# Patient Record
Sex: Male | Born: 1935 | Race: Black or African American | Hispanic: No | Marital: Married
Health system: Southern US, Community
[De-identification: ages and names within clinical notes are randomized; demographics above are authoritative.]

## PROBLEM LIST (undated history)

## (undated) DIAGNOSIS — N529 Male erectile dysfunction, unspecified: Secondary | ICD-10-CM

## (undated) DIAGNOSIS — Z87891 Personal history of nicotine dependence: Secondary | ICD-10-CM

## (undated) DIAGNOSIS — K219 Gastro-esophageal reflux disease without esophagitis: Secondary | ICD-10-CM

## (undated) DIAGNOSIS — R739 Hyperglycemia, unspecified: Secondary | ICD-10-CM

## (undated) DIAGNOSIS — I708 Atherosclerosis of other arteries: Secondary | ICD-10-CM

## (undated) DIAGNOSIS — I700 Atherosclerosis of aorta: Secondary | ICD-10-CM

## (undated) DIAGNOSIS — K589 Irritable bowel syndrome without diarrhea: Secondary | ICD-10-CM

## (undated) DIAGNOSIS — I10 Essential (primary) hypertension: Secondary | ICD-10-CM

## (undated) DIAGNOSIS — E785 Hyperlipidemia, unspecified: Secondary | ICD-10-CM

## (undated) DIAGNOSIS — K635 Polyp of colon: Secondary | ICD-10-CM

## (undated) DIAGNOSIS — C61 Malignant neoplasm of prostate: Secondary | ICD-10-CM

## (undated) DIAGNOSIS — I6529 Occlusion and stenosis of unspecified carotid artery: Secondary | ICD-10-CM

## (undated) HISTORY — DX: Essential (primary) hypertension: I10

## (undated) HISTORY — DX: Atherosclerosis of other arteries: I70.8

## (undated) HISTORY — DX: Hyperlipidemia, unspecified: E78.5

## (undated) HISTORY — DX: Irritable bowel syndrome, unspecified: K58.9

## (undated) HISTORY — DX: Polyp of colon: K63.5

## (undated) HISTORY — DX: Personal history of nicotine dependence: Z87.891

## (undated) HISTORY — DX: Hyperglycemia, unspecified: R73.9

## (undated) HISTORY — DX: Male erectile dysfunction, unspecified: N52.9

## (undated) HISTORY — PX: OTHER SURGICAL HISTORY: SHX169

## (undated) HISTORY — DX: Occlusion and stenosis of unspecified carotid artery: I65.29

## (undated) HISTORY — DX: Atherosclerosis of aorta: I70.0

## (undated) HISTORY — DX: Gastro-esophageal reflux disease without esophagitis: K21.9

## (undated) HISTORY — PX: TONSILLECTOMY: SUR1361

## (undated) HISTORY — DX: Malignant neoplasm of prostate: C61

---

## 1995-10-25 ENCOUNTER — Encounter: Payer: Self-pay | Admitting: Family Medicine

## 1995-10-25 LAB — CONVERTED CEMR LAB: PSA: 5.3 ng/mL

## 1998-01-15 ENCOUNTER — Encounter: Payer: Self-pay | Admitting: Family Medicine

## 1998-01-28 ENCOUNTER — Ambulatory Visit (HOSPITAL_BASED_OUTPATIENT_CLINIC_OR_DEPARTMENT_OTHER): Admission: RE | Admit: 1998-01-28 | Discharge: 1998-01-28 | Payer: Self-pay | Admitting: Orthopedic Surgery

## 1998-12-23 ENCOUNTER — Encounter: Payer: Self-pay | Admitting: Family Medicine

## 1998-12-23 LAB — CONVERTED CEMR LAB: PSA: 6.5 ng/mL

## 1999-11-02 ENCOUNTER — Encounter: Payer: Self-pay | Admitting: Family Medicine

## 1999-11-02 LAB — CONVERTED CEMR LAB
Blood Glucose, Fasting: 102 mg/dL
PSA: 5.5 ng/mL

## 2000-06-23 HISTORY — PX: OTHER SURGICAL HISTORY: SHX169

## 2000-12-22 ENCOUNTER — Encounter: Payer: Self-pay | Admitting: Family Medicine

## 2000-12-22 LAB — CONVERTED CEMR LAB
PSA: 5.8 ng/mL
TSH: 0.72 microintl units/mL

## 2001-03-01 HISTORY — PX: COLONOSCOPY: SHX174

## 2001-03-01 HISTORY — PX: ESOPHAGOGASTRODUODENOSCOPY: SHX1529

## 2001-03-02 ENCOUNTER — Encounter (INDEPENDENT_AMBULATORY_CARE_PROVIDER_SITE_OTHER): Payer: Self-pay | Admitting: *Deleted

## 2001-03-02 ENCOUNTER — Other Ambulatory Visit: Admission: RE | Admit: 2001-03-02 | Discharge: 2001-03-02 | Payer: Self-pay | Admitting: Gastroenterology

## 2001-04-24 HISTORY — PX: COLONOSCOPY: SHX174

## 2001-10-23 HISTORY — PX: COLONOSCOPY: SHX174

## 2002-03-08 ENCOUNTER — Encounter: Payer: Self-pay | Admitting: Family Medicine

## 2002-03-08 LAB — CONVERTED CEMR LAB: Blood Glucose, Fasting: 114 mg/dL

## 2003-04-11 ENCOUNTER — Encounter: Payer: Self-pay | Admitting: Family Medicine

## 2003-04-23 HISTORY — PX: PROSTATE BIOPSY: SHX241

## 2003-05-24 DIAGNOSIS — C61 Malignant neoplasm of prostate: Secondary | ICD-10-CM

## 2003-05-24 HISTORY — DX: Malignant neoplasm of prostate: C61

## 2003-06-02 ENCOUNTER — Ambulatory Visit: Admission: RE | Admit: 2003-06-02 | Discharge: 2003-08-31 | Payer: Self-pay | Admitting: Radiation Oncology

## 2003-08-11 ENCOUNTER — Encounter: Admission: RE | Admit: 2003-08-11 | Discharge: 2003-08-11 | Payer: Self-pay | Admitting: Radiation Oncology

## 2003-09-02 ENCOUNTER — Ambulatory Visit (HOSPITAL_COMMUNITY): Admission: RE | Admit: 2003-09-02 | Discharge: 2003-09-02 | Payer: Self-pay | Admitting: Urology

## 2003-09-02 ENCOUNTER — Ambulatory Visit (HOSPITAL_BASED_OUTPATIENT_CLINIC_OR_DEPARTMENT_OTHER): Admission: RE | Admit: 2003-09-02 | Discharge: 2003-09-02 | Payer: Self-pay | Admitting: Urology

## 2003-09-23 ENCOUNTER — Ambulatory Visit: Admission: RE | Admit: 2003-09-23 | Discharge: 2003-09-24 | Payer: Self-pay | Admitting: Radiation Oncology

## 2004-03-30 ENCOUNTER — Ambulatory Visit: Admission: RE | Admit: 2004-03-30 | Discharge: 2004-03-30 | Payer: Self-pay | Admitting: Radiation Oncology

## 2004-04-12 ENCOUNTER — Ambulatory Visit: Admission: RE | Admit: 2004-04-12 | Discharge: 2004-04-12 | Payer: Self-pay | Admitting: Radiation Oncology

## 2004-04-14 ENCOUNTER — Ambulatory Visit: Payer: Self-pay | Admitting: Family Medicine

## 2004-04-20 ENCOUNTER — Ambulatory Visit: Payer: Self-pay | Admitting: Family Medicine

## 2004-06-16 ENCOUNTER — Ambulatory Visit: Payer: Self-pay | Admitting: Gastroenterology

## 2004-06-22 ENCOUNTER — Ambulatory Visit: Payer: Self-pay | Admitting: Gastroenterology

## 2004-06-23 HISTORY — PX: COLONOSCOPY: SHX174

## 2005-04-19 ENCOUNTER — Ambulatory Visit: Payer: Self-pay | Admitting: Family Medicine

## 2005-04-19 LAB — CONVERTED CEMR LAB
Blood Glucose, Fasting: 108 mg/dL
Hgb A1c MFr Bld: 5.6 %
PSA: 1.22 ng/mL
WBC, blood: 7.7 10*3/uL

## 2005-04-21 ENCOUNTER — Ambulatory Visit: Payer: Self-pay | Admitting: Family Medicine

## 2006-04-04 ENCOUNTER — Ambulatory Visit: Payer: Self-pay | Admitting: Family Medicine

## 2006-05-11 ENCOUNTER — Ambulatory Visit: Payer: Self-pay | Admitting: Family Medicine

## 2006-05-11 LAB — CONVERTED CEMR LAB
Blood Glucose, Fasting: 107 mg/dL
Microalbumin U total vol: 4.5 mg/L
PSA: 0.6 ng/mL
TSH: 0.67 microintl units/mL
WBC, blood: 5.4 10*3/uL

## 2006-05-26 ENCOUNTER — Ambulatory Visit: Payer: Self-pay | Admitting: Family Medicine

## 2006-08-13 ENCOUNTER — Emergency Department: Payer: Self-pay | Admitting: Emergency Medicine

## 2006-08-18 ENCOUNTER — Ambulatory Visit: Payer: Self-pay | Admitting: Family Medicine

## 2006-08-25 ENCOUNTER — Ambulatory Visit: Payer: Self-pay | Admitting: Family Medicine

## 2007-03-21 ENCOUNTER — Encounter: Payer: Self-pay | Admitting: Family Medicine

## 2007-04-06 ENCOUNTER — Ambulatory Visit: Payer: Self-pay | Admitting: Family Medicine

## 2007-05-22 ENCOUNTER — Ambulatory Visit: Payer: Self-pay | Admitting: Gastroenterology

## 2007-05-30 ENCOUNTER — Ambulatory Visit: Payer: Self-pay | Admitting: Family Medicine

## 2007-05-30 LAB — CONVERTED CEMR LAB
BUN: 10 mg/dL (ref 6–23)
Calcium: 9.3 mg/dL (ref 8.4–10.5)
Chloride: 106 meq/L (ref 96–112)
Cholesterol: 193 mg/dL (ref 0–200)
Eosinophils Relative: 0.4 % (ref 0.0–5.0)
HDL: 28.9 mg/dL — ABNORMAL LOW (ref >39.0)
LDL Cholesterol: 138 mg/dL — ABNORMAL HIGH (ref 0–99)
Monocytes Absolute: 0.3 10*3/uL (ref 0.2–0.7)
Neutrophils Relative %: 83.4 % — ABNORMAL HIGH (ref 43.0–77.0)
Platelets: 266 10*3/uL (ref 150–400)
RBC: 4.86 M/uL (ref 4.22–5.81)
TSH: 0.92 microintl units/mL (ref 0.35–5.50)
Total CHOL/HDL Ratio: 6.7
WBC: 8.6 10*3/uL (ref 4.5–10.5)

## 2007-06-04 ENCOUNTER — Ambulatory Visit: Payer: Self-pay | Admitting: Family Medicine

## 2007-06-04 DIAGNOSIS — E782 Mixed hyperlipidemia: Secondary | ICD-10-CM | POA: Insufficient documentation

## 2007-06-05 ENCOUNTER — Encounter: Payer: Self-pay | Admitting: Family Medicine

## 2007-06-05 DIAGNOSIS — K209 Esophagitis, unspecified without bleeding: Secondary | ICD-10-CM | POA: Insufficient documentation

## 2007-06-05 DIAGNOSIS — R7303 Prediabetes: Secondary | ICD-10-CM

## 2007-06-05 DIAGNOSIS — N529 Male erectile dysfunction, unspecified: Secondary | ICD-10-CM

## 2007-06-08 ENCOUNTER — Encounter: Payer: Self-pay | Admitting: Family Medicine

## 2007-06-08 ENCOUNTER — Ambulatory Visit: Payer: Self-pay | Admitting: Gastroenterology

## 2007-06-08 ENCOUNTER — Encounter: Payer: Self-pay | Admitting: Gastroenterology

## 2007-06-08 DIAGNOSIS — Z860100 Personal history of colon polyps, unspecified: Secondary | ICD-10-CM | POA: Insufficient documentation

## 2007-06-08 HISTORY — PX: COLONOSCOPY: SHX174

## 2007-06-08 LAB — HM COLONOSCOPY

## 2008-02-19 ENCOUNTER — Ambulatory Visit: Payer: Self-pay | Admitting: Family Medicine

## 2008-03-10 ENCOUNTER — Ambulatory Visit: Payer: Self-pay | Admitting: Family Medicine

## 2008-04-08 ENCOUNTER — Ambulatory Visit: Payer: Self-pay | Admitting: Gastroenterology

## 2008-04-08 DIAGNOSIS — K589 Irritable bowel syndrome without diarrhea: Secondary | ICD-10-CM

## 2008-06-18 ENCOUNTER — Ambulatory Visit: Payer: Self-pay | Admitting: Family Medicine

## 2008-06-18 LAB — CONVERTED CEMR LAB
AST: 23 units/L (ref 0–37)
Albumin: 3.8 g/dL (ref 3.5–5.2)
Alkaline Phosphatase: 52 units/L (ref 39–117)
Calcium: 9.2 mg/dL (ref 8.4–10.5)
Chloride: 108 meq/L (ref 96–112)
Cholesterol: 142 mg/dL (ref 0–200)
Creatinine, Ser: 1 mg/dL (ref 0.4–1.5)
Creatinine,U: 91.1 mg/dL
Eosinophils Absolute: 0.1 10*3/uL (ref 0.0–0.7)
GFR calc Af Amer: 94 mL/min
HCT: 41.7 % (ref 39.0–52.0)
Lymphocytes Relative: 19.5 % (ref 12.0–46.0)
MCHC: 33.9 g/dL (ref 30.0–36.0)
Microalb Creat Ratio: 7.7 mg/g (ref 0.0–30.0)
Neutrophils Relative %: 73.9 % (ref 43.0–77.0)
PSA: 0.42 ng/mL (ref 0.10–4.00)
Platelets: 193 10*3/uL (ref 150–400)
Potassium: 3.9 meq/L (ref 3.5–5.1)
RDW: 13.3 % (ref 11.5–14.6)
TSH: 0.79 microintl units/mL (ref 0.35–5.50)
Total Bilirubin: 0.7 mg/dL (ref 0.3–1.2)
Total CHOL/HDL Ratio: 3.9

## 2008-06-24 ENCOUNTER — Ambulatory Visit: Payer: Self-pay | Admitting: Family Medicine

## 2009-03-24 ENCOUNTER — Ambulatory Visit: Payer: Self-pay | Admitting: Family Medicine

## 2009-06-24 ENCOUNTER — Ambulatory Visit: Payer: Self-pay | Admitting: Family Medicine

## 2009-06-24 LAB — CONVERTED CEMR LAB: Microalb, Ur: 1.6 mg/dL (ref 0.0–1.9)

## 2009-06-29 ENCOUNTER — Ambulatory Visit: Payer: Self-pay | Admitting: Family Medicine

## 2009-06-29 DIAGNOSIS — I10 Essential (primary) hypertension: Secondary | ICD-10-CM | POA: Insufficient documentation

## 2009-06-29 HISTORY — DX: Essential (primary) hypertension: I10

## 2009-07-21 ENCOUNTER — Ambulatory Visit: Payer: Self-pay | Admitting: Family Medicine

## 2009-07-21 LAB — CONVERTED CEMR LAB
BUN: 12 mg/dL (ref 6–23)
CO2: 30 meq/L (ref 19–32)
Calcium: 9.1 mg/dL (ref 8.4–10.5)
Sodium: 142 meq/L (ref 135–145)

## 2009-07-29 LAB — CONVERTED CEMR LAB
ALT: 16 units/L (ref 0–53)
Albumin: 3.9 g/dL (ref 3.5–5.2)
Alkaline Phosphatase: 57 units/L (ref 39–117)
BUN: 11 mg/dL (ref 6–23)
Basophils Absolute: 0 10*3/uL (ref 0.0–0.1)
Calcium: 9.1 mg/dL (ref 8.4–10.5)
Chloride: 108 meq/L (ref 96–112)
Eosinophils Absolute: 0 10*3/uL (ref 0.0–0.7)
HCT: 40.3 % (ref 39.0–52.0)
Lymphocytes Relative: 17.2 % (ref 12.0–46.0)
Lymphs Abs: 0.9 10*3/uL (ref 0.7–4.0)
MCHC: 32.8 g/dL (ref 30.0–36.0)
Monocytes Absolute: 0.3 10*3/uL (ref 0.1–1.0)
Neutrophils Relative %: 75 % (ref 43.0–77.0)
Potassium: 4.1 meq/L (ref 3.5–5.1)
RDW: 12.9 % (ref 11.5–14.6)
Sodium: 143 meq/L (ref 135–145)
Total CHOL/HDL Ratio: 4
Total Protein: 7 g/dL (ref 6.0–8.3)
VLDL: 21.4 mg/dL (ref 0.0–40.0)

## 2009-09-16 ENCOUNTER — Ambulatory Visit: Payer: Self-pay | Admitting: Family Medicine

## 2009-09-24 ENCOUNTER — Ambulatory Visit: Payer: Self-pay | Admitting: Family Medicine

## 2009-09-24 LAB — CONVERTED CEMR LAB
BUN: 12 mg/dL (ref 6–23)
CO2: 29 meq/L (ref 19–32)
Potassium: 4.3 meq/L (ref 3.5–5.1)
Sodium: 143 meq/L (ref 135–145)

## 2009-09-28 ENCOUNTER — Ambulatory Visit: Payer: Self-pay | Admitting: Family Medicine

## 2009-12-28 ENCOUNTER — Encounter (INDEPENDENT_AMBULATORY_CARE_PROVIDER_SITE_OTHER): Payer: Self-pay | Admitting: *Deleted

## 2010-02-25 ENCOUNTER — Ambulatory Visit: Payer: Self-pay | Admitting: Family Medicine

## 2010-06-24 NOTE — Assessment & Plan Note (Signed)
Summary: F/U AFTER LABS / LFW   Vital Signs:  Patient profile:   77 year old male Weight:      171.25 pounds Temp:     98.2 degrees F oral Pulse rate:   64 / minute Pulse rhythm:   regular BP sitting:   104 / 68  (left arm) Cuff size:   regular  Vitals Entered By: Sydell Axon LPN (Sep 28, 3084 2:29 PM) CC: 3 Month follow-up after labs   History of Present Illness: Pt here for followup of HTN, put on Lisinopril as he also has hyperglycenia and I have checked a BMet to check ACEI and sugar.  Today he feels well with no acute complaints..  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 One Tab By Mouth At Night. 2)  Prilosec 40 Mg  Cpdr (Omeprazole) .... One Tab By Mouth Once Daily. 3)  Citrucel  Powd (Methylcellulose (Laxative)) .... Take 1 Scoop Dissolved in Water Once Daily 4)  Timoptic 0.25 % Soln (Timolol Maleate) .... Use One Drop in Each Eye Two Times A Day 5)  Lisinopril 10 Mg Tabs (Lisinopril) .... Take One By Mouth Daily  Allergies: No Known Drug Allergies  Physical Exam  General:  Well-developed,well-nourished,in no acute distress; alert,appropriate and cooperative throughout examination, trim. Head:  Normocephalic and atraumatic without  obvious abnormalities. No apparent alopecia or balding. Eyes:  Conjunctiva clear bilaterally.  Ears:  External ear exam shows no significant lesions or deformities.  Otoscopic examination reveals clear canals, tympanic membranes are intact bilaterally without bulging, retraction, inflammation or discharge. Hearing is grossly normal bilaterally. Nose:  External nasal examination shows no deformity or inflammation. Nasal mucosa are pink and moist without lesions or exudates. Mild  clear mucous. Mouth:  Oral mucosa and oropharynx without lesions or exudates.  Teeth in good repair. Neck:  No deformities, masses, or tenderness noted. Chest Wall:  No deformities, masses, tenderness or gynecomastia noted. Lungs:  Normal respiratory effort, chest expands symmetrically. Lungs are clear to auscultation, no crackles or wheezes. Heart:  Normal rate and regular rhythm. S1 and S2 normal without gallop, murmur, click, rub or other extra sounds.   Impression & Recommendations:  Problem # 1:  ESSENTIAL HYPERTENSION (ICD-401.9) Assessment Improved  Finally good nos. Cont curr meds. His updated medication list for this problem includes:    Lisinopril 10 Mg Tabs (Lisinopril) .Marland Kitchen... Take one by mouth daily  BP today: 104/68 Prior BP: 140/80 (09/16/2009) initially 170/88 2/11.  Labs Reviewed: K+: 4.3 (09/24/2009) Creat: : 0.7 (09/24/2009)   Chol: 161 (06/24/2009)   HDL: 39.30 (06/24/2009)   LDL: 100 (06/24/2009)   TG: 107.0 (06/24/2009)  Problem # 2:  UNSPEC DISORDER CARBOHYDRATE TRANSPORT&METAB (ICD-271.9) Assessment: Deteriorated Fasting Glu higher today. Warned him about allowing this to cont to rise to 126 and diabetes.  We discussed avoiding seweets and carbs to avoid DM and keep glu under control.  Complete Medication List: 1)  Crestor 10 Mg Tabs (Rosuvastatin calcium) .... One tab by mouth at night. 2)  Prilosec 40 Mg Cpdr (Omeprazole) .... One tab by mouth once daily. 3)  Citrucel Powd  (Methylcellulose (laxative)) .... Take 1 scoop dissolved in water once daily 4)  Timoptic 0.25 % Soln (Timolol maleate) .... Use one drop in each eye two times a day 5)  Lisinopril 10 Mg Tabs (Lisinopril) .... Take one by mouth daily  Current Allergies (reviewed today): No known allergies

## 2010-06-24 NOTE — Assessment & Plan Note (Signed)
Summary: TICK BITE/NT   Vital Signs:  Patient profile:   77 year old male Weight:      173.25 pounds Temp:     98.3 degrees F oral Pulse rate:   76 / minute Pulse rhythm:   regular BP sitting:   140 / 80  (left arm) Cuff size:   regular  Vitals Entered By: Sydell Axon LPN (September 16, 2009 12:26 PM) CC: Tick bite on right leg, found tick last night   History of Present Illness: Pt here for having an engorged tick on his lower right abd in the inguinal ligament region yesterday for part of the day. He picked it off last night intact. He has no swelling or pain. He has seen no rash. He feels well without fever.  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 One Tab By Mouth At Night. 2)  Prilosec 40 Mg  Cpdr (Omeprazole) .... One Tab By Mouth Once Daily. 3)  Citrucel  Powd (Methylcellulose (Laxative)) .... Take 1 Scoop Dissolved in Water Once Daily 4)  Timoptic 0.25 % Soln (Timolol Maleate) .... Use One Drop in Each Eye Two Times A Day 5)  Lisinopril 10 Mg Tabs (Lisinopril) .... 1/2 Tab By Mouth For Two Weeks and Then Increase To Whole Pill.  Allergies: No Known Drug Allergies  Physical Exam  General:  Well-developed,well-nourished,in no acute distress; alert,appropriate and cooperative  throughout examination, trim. Head:  Normocephalic and atraumatic without obvious abnormalities. No apparent alopecia or balding. Eyes:  Conjunctiva clear bilaterally.  Ears:  External ear exam shows no significant lesions or deformities.  Otoscopic examination reveals clear canals, tympanic membranes are intact bilaterally without bulging, retraction, inflammation or discharge. Hearing is grossly normal bilaterally. Nose:  External nasal examination shows no deformity or inflammation. Nasal mucosa are pink and moist without lesions or exudates. Mild  clear mucous. Mouth:  Oral mucosa and oropharynx without lesions or exudates.  Teeth in good repair. Skin:  small abraded area of the right inguinal ligament area approx 8mm diam w/o irritation, swelling or rash...is minimally erythem.   Impression & Recommendations:  Problem # 1:  TICK BITE (ICD-E906.4) Assessment New Was on less than 24 hrs...risk of dz very low. Discussed rash, fever and joint aches as problems to look for but doubt will see. Call if sxs begin.  Complete Medication List: 1)  Crestor 10 Mg Tabs (Rosuvastatin calcium) .... One tab by mouth at night. 2)  Prilosec 40 Mg Cpdr (Omeprazole) .... One tab by mouth once daily. 3)  Citrucel Powd (Methylcellulose (laxative)) .... Take 1 scoop dissolved in water once daily 4)  Timoptic 0.25 % Soln (Timolol maleate) .... Use one drop in each eye two times a day 5)  Lisinopril 10 Mg Tabs (Lisinopril) .... Take one by mouth daily  Current Allergies (reviewed today): No known allergies

## 2010-06-24 NOTE — Assessment & Plan Note (Signed)
Summary: CPX/DLO   Vital Signs:  Patient profile:   77 year old male Height:      68.5 inches Weight:      171.12 pounds BMI:     25.73 Temp:     97.5 degrees F oral Pulse rate:   60 / minute Pulse rhythm:   regular BP sitting:   170 / 88  (left arm) Cuff size:   regular  Vitals Entered By: Sydell Axon LPN (June 29, 2009 8:21 AM) CC: 30 Minute checkup, colonoscopy 01/09 by Dr. Christella Hartigan   History of Present Illness: Pt here for Comp Exam, doing well with no complaints.  Preventive Screening-Counseling & Management  Alcohol-Tobacco     Alcohol drinks/day: 0     Smoking Status: quit     Year Quit: 1995     Pack years: 40     Passive Smoke Exposure: no  Caffeine-Diet-Exercise     Caffeine use/day: 1     Does Patient Exercise: yes     Type of exercise: bowling 5 days, bikes or walks     Times/week: 5  Problems Prior to Update: 1)  Need Prophylactic Vaccination&inoculation Flu  (ICD-V04.81) 2)  Special Screening Malignant Neoplasm of Prostate  (ICD-V76.44) 3)  Irritable Bowel Syndrome  (ICD-564.1) 4)  Colorectal Cancer, Family Hx  (ICD-V16.0) 5)  Colonic Polyps, Adenomatous, Hx of  (ICD-V12.72) 6)  Erectile Dysfunction, Organic  (ICD-607.84) 7)  Unspecified Glaucoma  (ICD-365.9) 8)  Esophagitis  (ICD-530.10) 9)  Colonic Polyps  (ICD-211.3) 10)  Mixed Hyperlipidemia  (ICD-272.2) 11)  Health Maintenance Exam  (ICD-V70.0) 12)  Malig Neopl of Prostate, 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 One Tab By Mouth At Night. 2)  Xalatan 0.005 % Soln (Latanoprost) .... Apply 1 Drop Into Affected Eye 3)  Prilosec 40 Mg  Cpdr (Omeprazole) .... One Tab By Mouth Once Daily. 4)  Citrucel  Powd (Methylcellulose (Laxative)) .... Take 1 Scoop Dissolved in Water Once Daily  Allergies: No  Known Drug Allergies  Past History:  Past Surgical History: Last updated: 06/24/2008 RIGHT WRIST RELEASE 10/1996  REPEAT 01/1998 NCS/ WRISTS LEFT CARPAL   MIN. / RIGHT IMPROVED:(06/2000) EGD [facility].H. GASTRITIS ESOPH,DUDODENITIS :(03/01/2001) COLONOSCOPYMULTIPLE POLYPS ,DIVERTIC // ADENOMATOUS POLYPS:(03/01/2001) COLONOSCOPY: MULTIPLE POLYPS:(04/24/2001) COLONOSCOPY ; POLYPS BENIGN REPEAT Q 2 YEARS:(10/23/2001) PROSTATE BX POS   RADIOACTIVE SEED IMPLANT (DR SURAL)  04/2003 PROSTATE EXT BEAM RADIATION  (DER GOODCHILD) 1/27-07/23/2003 COLONOSCOPY/ ADENOM/HYPERPLASTIC POLYPS ; EXTERNAL HEMORRHOIDS MULTIPLE (06/23/2004) COLONOSCOPY SINGLE SM POLYP 06/08/2007       REPEAT 5 YRS  Family History: Last updated: 06/05/2007 Father dec 48s unknown Mother dec 85 Colon Ca Hyperchol  Carotid dz no sibs CV: NEGATIVE HBP: NEGATIVE DM: NEGATIVE GOUT/ARTHRITIS: PROSTATE CANCER: +SELF BREAST/OVARIAN/UTERINE CANCER: COLON CANCER: + MOTHER DEPRESSION: NEGATIVE ETOH/DRUG ABUSE: NEGATIVE OTHER: NEGATIVE STROKE  Social History: Last updated: 06/24/2008 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 Eyes:  Denies blurring, discharge, and eye pain; sees neye dr regularly for glaucoma. Pressures stable.. ENT:  Denies decreased hearing, ear discharge, earache, and ringing in ears. CV:  Denies  chest pain or discomfort, fainting, fatigue, palpitations, shortness of breath with exertion, and swelling of feet. Resp:  Denies cough, shortness of breath, and wheezing. GI:  Denies abdominal pain, bloody stools, change in bowel habits, constipation, dark tarry stools, diarrhea, indigestion, loss of  appetite, nausea, vomiting, vomiting blood, and yellowish skin color; IB well controlled with Citrucel.. GU:  Denies dysuria, hematuria, incontinence, nocturia, and urinary frequency. MS:  Denies joint pain, joint redness, low back pain, muscle aches, cramps, and stiffness. Derm:  Denies dryness, itching, and rash. Neuro:  Denies numbness, poor balance, tingling, and tremors.  Physical Exam  General:  Well-developed,well-nourished,in no acute distress; alert,appropriate and cooperative throughout examination, trim. Head:  Normocephalic and atraumatic without obvious abnormalities. No apparent alopecia or balding. Eyes:  Conjunctiva clear bilaterally.  Ears:  External ear exam shows no significant lesions or deformities.  Otoscopic examination reveals clear canals, tympanic membranes are intact bilaterally without bulging, retraction, inflammation or discharge. Hearing is grossly normal bilaterally. Nose:  External nasal examination shows no deformity or inflammation. Nasal mucosa are pink and moist without lesions or exudates. Mild  clear mucous. Mouth:  Oral mucosa and oropharynx without lesions or exudates.  Teeth in good repair. Neck:  No deformities, masses, or tenderness noted. Chest Wall:  No deformities, masses, tenderness or gynecomastia noted. Breasts:  No masses or gynecomastia noted Lungs:  Normal respiratory effort, chest expands symmetrically. Lungs are clear to auscultation, no crackles or wheezes. Heart:  Normal rate and regular rhythm. S1 and S2 normal without gallop, murmur, click, rub or other extra sounds. Abdomen:  Bowel sounds positive,abdomen soft and non-tender without masses, organomegaly or hernias noted. Rectal:  No external abnormalities noted. Normal sphincter tone. No rectal masses or tenderness. G neg. Genitalia:  Testes bilaterally descended without nodularity, tenderness or masses. No scrotal masses or lesions. No penis lesions or urethral discharge. Prostate:   Prostate gland firm and smooth, no enlargement, nodularity, tenderness, mass, asymmetry or induration. 30gms. Msk:  No deformity or scoliosis noted of thoracic or lumbar spine.   Pulses:  R and L carotid,radial,femoral,dorsalis pedis and posterior tibial pulses are full and equal bilaterally Extremities:  No clubbing, cyanosis, edema, or deformity noted with normal full range of motion of all joints.   Neurologic:  No cranial nerve deficits noted. Station and gait are normal. Sensory, motor and coordinative functions appear intact. Skin:  Intact without suspicious lesions or rashes Cervical Nodes:  No lymphadenopathy noted Inguinal Nodes:  No significant adenopathy Psych:  Cognition and judgment appear intact. Alert and cooperative with normal attention span and concentration. No apparent delusions, illusions, hallucinations   Impression & Recommendations:  Problem # 1:  HEALTH MAINTENANCE EXAM (ICD-V70.0) Assessment Comment Only  Problem # 2:  SPECIAL SCREENING MALIGNANT NEOPLASM OF PROSTATE (ICD-V76.44) Assessment: Unchanged Stable. PSA not zero but treated with Seeds and PSA stable, has not elevated.   Problem # 3:  IRRITABLE BOWEL SYNDROME (ICD-564.1) Assessment: Improved Stable and well controlled with Citrucel. Had been taking with ice water and learned to take it with room temp water to dissolve better and has worked well since.  Problem # 4:  COLONIC POLYPS, ADENOMATOUS, HX OF (ICD-V12.72) Assessment: Unchanged Due f/u colonoscopy in 14.  Problem # 5:  UNSPECIFIED GLAUCOMA (ICD-365.9) Assessment: Unchanged Controlled.  Problem # 6:  ESOPHAGITIS (ICD-530.10) Assessment: Unchanged Stable, no complaints. Takes Omeprazole regularly.  Problem # 7:  MIXED HYPERLIPIDEMIA (ICD-272.2) Assessment: Unchanged Well controlled. His updated medication list for this problem includes:  Crestor 10 Mg Tabs (Rosuvastatin calcium) ..... One tab by mouth at night.  Labs Reviewed: SGOT:  21 (06/24/2009)   SGPT: 16 (06/24/2009)   HDL:39.30 (06/24/2009), 36.0 (06/18/2008)  LDL:100 (06/24/2009), 86 (16/02/9603)  Chol:161 (06/24/2009), 142 (06/18/2008)  Trig:107.0 (06/24/2009), 99 (06/18/2008)  Problem # 8:  UNSPEC DISORDER CARBOHYDRATE TRANSPORT&METAB (ICD-271.9) Assessment: Unchanged Stable, encouraged to be careful with sugar intake.  Problem # 9:  ESSENTIAL HYPERTENSION (ICD-401.9) Assessment: New  Start Meds to protect kidney since has glu intol. Recheck. His updated medication list for this problem includes:    Lisinopril 10 Mg Tabs (Lisinopril) .Marland Kitchen... 1/2 tab by mouth for two weeks and then increase to whole pill.  BP today: 170/88 BP by me, same. Prior BP: 120/70 (06/24/2008)  Labs Reviewed: K+: 4.1 (06/24/2009) Creat: : 0.9 (06/24/2009)   Chol: 161 (06/24/2009)   HDL: 39.30 (06/24/2009)   LDL: 100 (06/24/2009)   TG: 107.0 (06/24/2009)  Complete Medication List: 1)  Crestor 10 Mg Tabs (Rosuvastatin calcium) .... One tab by mouth at night. 2)  Prilosec 40 Mg Cpdr (Omeprazole) .... One tab by mouth once daily. 3)  Citrucel Powd (Methylcellulose (laxative)) .... Take 1 scoop dissolved in water once daily 4)  Timoptic 0.25 % Soln (Timolol maleate) .... Use one drop in each eye two times a day 5)  Lisinopril 10 Mg Tabs (Lisinopril) .... 1/2 tab by mouth for two weeks and then increase to whole pill.  Patient Instructions: 1)  RTC 3 mos, Bmet 3 wks and prior 401.9 Prescriptions: PRILOSEC 40 MG  CPDR (OMEPRAZOLE) one tab by mouth once daily.  #90 x 3   Entered and Authorized by:   Shaune Leeks MD   Signed by:   Shaune Leeks MD on 06/29/2009   Method used:   Electronically to        [facility] [phone]* (retail)       [address]       [place], [place]  [address]       Ph: [phone]       Fax: [phone]   RxID:   [id number] CRESTOR 10 MG  TABS (ROSUVASTATIN CALCIUM) one tab by mouth at night.  #30 x 12   Entered and  Authorized by:   Shaune Leeks MD   Signed by:   Shaune Leeks MD on 06/29/2009   Method used:   Electronically to        [facility] [phone]* (retail)       [address]       [place], [place]  [address]       Ph: [phone]       Fax: [phone]   RxID:   [id number] LISINOPRIL 10 MG TABS (LISINOPRIL) 1/2 tab by mouth for two weeks and then increase to whole pill.  #30 x 12   Entered and Authorized by:   Shaune Leeks MD   Signed by:   Shaune Leeks MD on 06/29/2009   Method used:   Electronically to        [facility] [phone]* (retail)       [address]       [place], [place]  [address]       Ph: [phone]  Fax: [phone]   RxID:   [id number]   Current Allergies (reviewed today): No known allergies

## 2010-06-24 NOTE — Assessment & Plan Note (Signed)
Summary: FLU SHOT/CLE  Nurse Visit   Allergies: No Known Drug Allergies  Orders Added: 1)  Flu Vaccine 3yrs + MEDICARE PATIENTS [Q2039] 2)  Administration Flu vaccine - MCR [G0008]  Flu Vaccine Consent Questions     Do you have a history of severe allergic reactions to this vaccine? no    Any prior history of allergic reactions to egg and/or gelatin? no    Do you have a sensitivity to the preservative Thimersol? no    Do you have a past history of Guillan-Barre Syndrome? no    Do you currently have an acute febrile illness? no    Have you ever had a severe reaction to latex? no    Vaccine information given and explained to patient? yes    Are you currently pregnant? no    Lot Number:[id number]   Exp Date:11/20/2010   Site Given  Left Deltoid IMu  

## 2010-06-24 NOTE — Letter (Signed)
Summary: Nadara Eaton letter  [facility]  [address] [place], [place] [address]   Phone: [phone]  Fax: [phone]       12/28/2009 MRN: [id number]  [facility] [address] [place], [place]  [address]  Dear Mr. BELTRAN,  [facility], and [facility] announce the retirement of Arta Silence, M.D., from full-time practice at the [facility] office effective November 19, 2009 and his plans of returning part-time.  It is important to Dr. Hetty Ely and to our practice that you understand that [facility] has seven physicians in our office for your health care needs.  We will continue to offer the same exceptional care that you have today.    Dr. Hetty Ely has spoken to many of you about his plans for retirement and returning part-time in the fall.   We will continue to work with you through the transition to schedule appointments for you in the office and meet the high standards that [facility] is committed to.   Again, it is with great pleasure that we share the news that Dr. Hetty Ely will return to [facility] in October of 2011 with a reduced schedule.    If you have any questions, or would like to request an appointment with one of our physicians, please call us at [phone] and press the option for Scheduling an appointment.  We take pleasure in providing you with excellent patient care and look forward to seeing you at your next office visit.  Our [facility] Physicians are:  Tillman Abide, M.D. Laurita Quint, M.D. Roxy Manns, M.D. Kerby Nora, M.D. Hannah Beat, M.D. Ruthe Mannan, M.D. We proudly welcomed Raechel Ache, M.D. and Eustaquio Boyden, M.D. to the practice in July/August 2011.  Sincerely,  [facility]

## 2010-06-25 ENCOUNTER — Encounter (INDEPENDENT_AMBULATORY_CARE_PROVIDER_SITE_OTHER): Payer: Self-pay | Admitting: *Deleted

## 2010-06-25 ENCOUNTER — Ambulatory Visit: Admit: 2010-06-25 | Payer: Self-pay | Admitting: Family Medicine

## 2010-06-25 ENCOUNTER — Other Ambulatory Visit (INDEPENDENT_AMBULATORY_CARE_PROVIDER_SITE_OTHER): Payer: MEDICARE

## 2010-06-25 ENCOUNTER — Other Ambulatory Visit: Payer: Self-pay | Admitting: Family Medicine

## 2010-06-25 DIAGNOSIS — E782 Mixed hyperlipidemia: Secondary | ICD-10-CM

## 2010-06-25 DIAGNOSIS — E749 Disorder of carbohydrate metabolism, unspecified: Secondary | ICD-10-CM

## 2010-06-25 DIAGNOSIS — Z86010 Personal history of colonic polyps: Secondary | ICD-10-CM

## 2010-06-25 DIAGNOSIS — I10 Essential (primary) hypertension: Secondary | ICD-10-CM

## 2010-06-25 DIAGNOSIS — Z125 Encounter for screening for malignant neoplasm of prostate: Secondary | ICD-10-CM

## 2010-06-25 LAB — BASIC METABOLIC PANEL
Calcium: 9 mg/dL (ref 8.4–10.5)
Creatinine, Ser: 0.8 mg/dL (ref 0.4–1.5)
GFR: 120.99 mL/min (ref >60.00)
Glucose, Bld: 101 mg/dL — ABNORMAL HIGH (ref 70–99)
Sodium: 141 mEq/L (ref 135–145)

## 2010-06-25 LAB — CBC WITH DIFFERENTIAL/PLATELET
Basophils Absolute: 0 10*3/uL (ref 0.0–0.1)
Basophils Relative: 0.4 % (ref 0.0–3.0)
HCT: 42.4 % (ref 39.0–52.0)
Hemoglobin: 14.1 g/dL (ref 13.0–17.0)
MCV: 87 fl (ref 78.0–100.0)
Monocytes Relative: 6.2 % (ref 3.0–12.0)
WBC: 6.4 10*3/uL (ref 4.5–10.5)

## 2010-06-25 LAB — TSH: TSH: 1.22 u[IU]/mL (ref 0.35–5.50)

## 2010-06-25 LAB — MICROALBUMIN / CREATININE URINE RATIO: Microalb Creat Ratio: 3.3 mg/g (ref 0.0–30.0)

## 2010-06-25 LAB — LIPID PANEL
LDL Cholesterol: 91 mg/dL (ref 0–99)
Total CHOL/HDL Ratio: 4

## 2010-06-25 LAB — HEPATIC FUNCTION PANEL
ALT: 15 U/L (ref 0–53)
AST: 17 U/L (ref 0–37)
Bilirubin, Direct: 0.1 mg/dL (ref 0.0–0.3)

## 2010-06-25 LAB — PSA: PSA: 0.77 ng/mL (ref 0.10–4.00)

## 2010-06-30 ENCOUNTER — Encounter: Payer: Self-pay | Admitting: Family Medicine

## 2010-06-30 ENCOUNTER — Encounter (INDEPENDENT_AMBULATORY_CARE_PROVIDER_SITE_OTHER): Payer: MEDICARE | Admitting: Family Medicine

## 2010-06-30 DIAGNOSIS — Z0000 Encounter for general adult medical examination without abnormal findings: Secondary | ICD-10-CM

## 2010-07-07 ENCOUNTER — Encounter: Payer: Self-pay | Admitting: Family Medicine

## 2010-07-08 NOTE — Assessment & Plan Note (Signed)
Summary: CPX/ CLE   Vital Signs:  Patient profile:   77 year old male Weight:      168.25 pounds BMI:     25.30 Temp:     97.2 degrees F oral Pulse rate:   64 / minute Pulse rhythm:   regular BP sitting:   124 / 80  (left arm) Cuff size:   regular  Vitals Entered By: Sydell Axon LPN (June 30, 2010 8:08 AM) CC: 30 minute checkup, had a colonoscopy 1/09   History of Present Illness: Pt here for Comp Exam, feeling well without complaint. HE feels well.  He is tolerating his medications well without problem. He has been diagnosed with glaucoma. He is now on drops. He sees someone at [facility].   Preventive Screening-Counseling & Management  Alcohol-Tobacco     Alcohol drinks/day: 0     Smoking Status: quit     Year Quit: 1995     Pack years: 40     Passive Smoke Exposure: no  Caffeine-Diet-Exercise     Caffeine use/day: 1     Does Patient Exercise: yes     Type of exercise: bowling 5 days, bikes or walks     Times/week: 5  Problems Prior to Update: 1)  Essential Hypertension  (ICD-401.9) 2)  Need Prophylactic Vaccination&inoculation Flu  (ICD-V04.81) 3)  Special Screening Malignant Neoplasm of Prostate  (ICD-V76.44) 4)  Irritable Bowel Syndrome  (ICD-564.1) 5)  Colorectal Cancer, Family Hx  (ICD-V16.0) 6)  Colonic Polyps, Adenomatous, Hx of  (ICD-V12.72) 7)  Erectile Dysfunction, Organic  (ICD-607.84) 8)  Unspecified Glaucoma  (ICD-365.9) 9)  Esophagitis  (ICD-530.10) 10)  Mixed Hyperlipidemia  (ICD-272.2) 11)  Health Maintenance Exam  (ICD-V70.0) 12)  Malig Neopl of Prostate, 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 One Tab By Mouth At Night. 2)  Prilosec 40 Mg  Cpdr (Omeprazole) .... One Tab By Mouth Once Daily. 3)  Citrucel  Powd (Methylcellulose (Laxative)) .... Take 1 Scoop Dissolved in Water Once Daily 4)  Timoptic  0.25 % Soln (Timolol Maleate) .... Use One Drop in Each Eye Two Times A Day 5)  Lisinopril 10 Mg Tabs (Lisinopril) .... Take One By Mouth Daily  Current Medications (verified): 1)  Crestor 10 Mg  Tabs (Rosuvastatin Calcium) .... One Tab By Mouth At Night. 2)  Prilosec 40 Mg  Cpdr (Omeprazole) .... One Tab By Mouth Once Daily. 3)  Citrucel  Powd (Methylcellulose (Laxative)) .... Take 1 Scoop Dissolved in Water Once Daily 4)  Timoptic 0.25 % Soln (Timolol Maleate) .... Use One Drop in Each Eye Two Times A Day 5)  Lisinopril 10 Mg Tabs (Lisinopril) .... Take One By Mouth Daily  Allergies: No Known Drug Allergies  Past History:  Past Surgical History: Last updated: 06/24/2008 RIGHT WRIST RELEASE 10/1996  REPEAT 01/1998 NCS/ WRISTS LEFT CARPAL   MIN. / RIGHT IMPROVED:(06/2000) EGD H.H. GASTRITIS ESOPH,DUDODENITIS :(03/01/2001) COLONOSCOPYMULTIPLE POLYPS ,DIVERTIC // ADENOMATOUS POLYPS:(03/01/2001) COLONOSCOPY: MULTIPLE POLYPS:(04/24/2001) COLONOSCOPY ; POLYPS BENIGN REPEAT Q 2 YEARS:(10/23/2001) PROSTATE BX POS   RADIOACTIVE SEED IMPLANT (DR SURAL)  04/2003 PROSTATE EXT BEAM RADIATION  (DER GOODCHILD) 1/27-07/23/2003 COLONOSCOPY/ ADENOM/HYPERPLASTIC POLYPS ; EXTERNAL HEMORRHOIDS MULTIPLE (06/23/2004) COLONOSCOPY SINGLE SM POLYP 06/08/2007       REPEAT 5 YRS  Family History: Last updated: 06/05/2007 Father dec 54s unknown Mother dec 85 Colon Ca Hyperchol  Carotid dz no sibs  CV: NEGATIVE HBP: NEGATIVE DM: NEGATIVE GOUT/ARTHRITIS: PROSTATE CANCER: +SELF BREAST/OVARIAN/UTERINE CANCER: COLON CANCER: + MOTHER DEPRESSION: NEGATIVE ETOH/DRUG ABUSE: NEGATIVE OTHER: NEGATIVE STROKE  Social History: Last updated: 06/24/2008 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 Eyes:  Denies blurring, discharge, and eye pain; has glaucoma. ENT:  Denies decreased hearing, ear discharge, earache, and ringing in ears. CV:  Denies chest pain or discomfort, fainting, fatigue, palpitations, shortness of breath with exertion, swelling of feet, and swelling of hands. Resp:  Denies cough, shortness of breath, and wheezing. GI:  Denies abdominal pain, bloody stools, change in bowel habits, constipation, dark tarry stools, diarrhea, indigestion, loss of appetite, nausea, vomiting, vomiting blood, and yellowish skin color. GU:  Denies discharge, dysuria, nocturia, and urinary frequency. MS:  Denies joint pain, muscle aches, cramps, and stiffness. Derm:  Denies dryness, itching, and rash. Neuro:  Denies numbness, poor balance, tingling, and tremors.  Physical Exam  General:  Well-developed,well-nourished,in no acute distress; alert,appropriate and cooperative throughout examination, trim. Head:  Normocephalic and atraumatic without obvious abnormalities. No apparent alopecia or balding. Eyes:  Conjunctiva clear bilaterally.  Ears:  External ear exam shows no significant lesions or deformities.  Otoscopic examination reveals clear canals, tympanic membranes are intact bilaterally without bulging, retraction, inflammation or discharge. Hearing is grossly normal bilaterally. Nose:  External nasal examination shows no deformity or inflammation. Nasal mucosa are pink and moist without lesions or exudates. Mild  clear mucous. Mouth:  Oral mucosa and oropharynx without lesions or exudates.  Teeth in good repair. Neck:  No deformities, masses, or tenderness noted. Chest Wall:  No deformities, masses, tenderness or gynecomastia noted. Breasts:  No masses or gynecomastia noted Lungs:  Normal respiratory effort, chest expands symmetrically. Lungs are clear to auscultation, no  crackles or wheezes. Heart:  Normal rate and regular rhythm. S1 and S2 normal without gallop, murmur, click, rub or other extra sounds. Abdomen:  Bowel sounds positive,abdomen soft and non-tender without masses, organomegaly or hernias noted. Rectal:  No external abnormalities noted. Normal sphincter tone. No rectal masses or tenderness. G neg. Genitalia:  Testes bilaterally descended without nodularity, tenderness or masses. No scrotal masses or lesions. No penis lesions or urethral discharge. Prostate:  Prostate gland firm and smooth, no enlargement, nodularity, tenderness, mass, asymmetry or induration. 30gms. Msk:  No deformity or scoliosis noted of thoracic or lumbar spine.   Pulses:  R and L carotid,radial,femoral,dorsalis pedis and posterior tibial pulses are full and equal bilaterally Extremities:  No clubbing, cyanosis, edema, or deformity noted with normal full range of motion of all joints.   Neurologic:  No cranial nerve deficits noted. Station and gait are normal. Sensory, motor and coordinative functions appear intact. Skin:  Intact without suspicious lesions or rashes, sparse benign moles of the trunk. Cervical Nodes:  No lymphadenopathy noted Inguinal Nodes:  No significant adenopathy Psych:  Cognition and judgment appear intact. Alert and cooperative with normal attention span and concentration. No apparent delusions, illusions, hallucinations   Impression & Recommendations:  Problem # 1:  HEALTH MAINTENANCE EXAM (ICD-V70.0) Assessment Comment Only  Reviewed preventive care protocols, scheduled due services, and updated immunizations. Given script for Zostavax.  Problem # 2:  SPECIAL SCREENING MALIGNANT NEOPLASM OF PROSTATE (ICD-V76.44) Assessment: Unchanged Stable PSA  and exam.  Problem # 3:  IRRITABLE BOWEL SYNDROME (ICD-564.1) Assessment: Unchanged Stable and welll regulated.  Problem # 4:  MIXED HYPERLIPIDEMIA (ICD-272.2) Assessment: Unchanged Good nos on  Crestor. Script sent. His updated medication list for this problem includes:    Crestor 10 Mg Tabs (Rosuvastatin calcium) ..... One tab by mouth at night.  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 Again discussed diet control. HE is very careful..  Complete Medication List: 1)  Crestor 10 Mg Tabs (Rosuvastatin calcium) .... One tab by mouth at night. 2)  Prilosec 40 Mg Cpdr (Omeprazole) .... One tab by mouth once daily. 3)  Citrucel Powd (Methylcellulose (laxative)) .... Take 1 scoop dissolved in water once daily 4)  Timoptic 0.25 % Soln (Timolol maleate) .... Use one drop in each eye two times a day 5)  Lisinopril 10 Mg Tabs (Lisinopril) .... Take one by mouth daily 6)  Zostavax 02725 Unt/0.62ml Solr (Zoster vaccine live) .... Administer  Patient Instructions: 1)  RTC one year, sooner as needed. Prescriptions: LISINOPRIL 10 MG TABS (LISINOPRIL) Take one by mouth daily  #90 x 3   Entered and Authorized by:   Shaune Leeks MD   Signed by:   Shaune Leeks MD on 06/30/2010   Method used:   Electronically to        [facility]. [phone]* (retail)       [address]       [place], [place]  [address]       Ph: [phone]       Fax: [phone]   RxID:   [id number] PRILOSEC 40 MG  CPDR (OMEPRAZOLE) one tab by mouth once daily.  #90 x 3   Entered and Authorized by:   Shaune Leeks MD   Signed by:   Shaune Leeks MD on 06/30/2010   Method used:   Electronically to        [facility]. [phone]* (retail)       [address]       [place], [place]  [address]       Ph: [phone]       Fax: [phone]   RxID:    [id number] CRESTOR 10 MG  TABS (ROSUVASTATIN CALCIUM) one tab by mouth at night.  #30 x 12   Entered and Authorized by:   Shaune Leeks MD   Signed by:   Shaune Leeks MD on 06/30/2010   Method used:   Electronically to        [facility]. [phone]* (retail)       [address]       [place], [place]  [address]       Ph: [phone]       Fax: [phone]   RxID:   [id number] ZOSTAVAX 19400 UNT/0.65ML SOLR (ZOSTER VACCINE LIVE) administer  #1 x 0   Entered and Authorized by:   Shaune Leeks MD   Signed by:   Shaune Leeks MD on 06/30/2010   Method used:   Print then Give to Patient  RxID:   [id number]    Orders Added: 1)  Est. Patient 65& > [13086]    Current Allergies (reviewed today): No known allergies

## 2010-07-14 NOTE — Miscellaneous (Signed)
Summary: Zostavax   Clinical Lists Changes  Observations: Added new observation of ZOSTAVAX: Zostavax (07/01/2010 15:15)      Other Immunization History:    Zostavax # 1:  Zostavax (07/01/2010) Received form from [facility], [place]

## 2010-10-08 NOTE — Op Note (Signed)
NAME:  CASTLE, LAMONS                          ACCOUNT NO.:  [id number]   MEDICAL RECORD NO.:  [id number]                   PATIENT TYPE:  AMB   LOCATION:  [facility]                                 FACILITY:  [facility]   PHYSICIAN:  Claudette Laws, M.D.               DATE OF BIRTH:  1932-10-08   DATE OF PROCEDURE:  09/02/2003  DATE OF DISCHARGE:                                 OPERATIVE REPORT   PREOPERATIVE DIAGNOSIS:  Carcinoma of the prostate gland.   POSTOPERATIVE DIAGNOSIS:  Carcinoma of the prostate gland.   OPERATION:  1. Transperineal radioactive seed implantation, I-125.  2. Cystoscopy.   SURGEON:  Claudette Laws, M.D.   PROCEDURE:  The patient was prepped and draped in the dorsal lithotomy  position under general anesthesia.  A Foley catheter was placed.  The rectal  tube was placed, and then Dr. Kathrin Greathouse placed the transrectal probe,  and imaging studies were performed, conforming to the pre-implant volume  study.  We then performed transperineal implantation using a total of 24  needles and 107 seeds.  Anchors were placed at  C 2.5 and E 2.5.  The  procedure went well.  At the conclusion, the Foley catheter was removed,  rigid cystoscopy confirmed a normal anterior urethra, a somewhat hemorrhagic  prostate.  The bladder itself looked normal.  No tumors, calculi, or seeds  were normal.  Normal ureteral orifices.  I replaced the Foley with an 44  French 10 cc catheter and hooked it to a drainage bag.  The patient was then  taken back to the recovery room in satisfactory condition.                                               Claudette Laws, M.D.    RFS/MEDQ  D:  09/02/2003  T:  09/02/2003  Job:  [id number]

## 2010-11-09 ENCOUNTER — Ambulatory Visit (INDEPENDENT_AMBULATORY_CARE_PROVIDER_SITE_OTHER): Payer: Medicare Other | Admitting: Family Medicine

## 2010-11-09 ENCOUNTER — Encounter: Payer: Self-pay | Admitting: Family Medicine

## 2010-11-09 VITALS — BP 122/78 | HR 68 | Temp 98.2°F | Ht 70.0 in | Wt 164.1 lb

## 2010-11-09 DIAGNOSIS — J069 Acute upper respiratory infection, unspecified: Secondary | ICD-10-CM

## 2010-11-09 MED ORDER — DOXYCYCLINE HYCLATE 100 MG PO CAPS: 100.0000 mg | ORAL_CAPSULE | Freq: Two times a day (BID) | ORAL | Status: AC

## 2010-11-09 NOTE — Assessment & Plan Note (Signed)
Sounds like viral URTI. Treat supportively as per instructions. Given recent tick exposure and chills, provided with doxy script.  Fill if any worsening or change.  However may hold on abx given improving on own.

## 2010-11-09 NOTE — Patient Instructions (Signed)
Sounds like viral upper respiratory infection. Treat supportively with plenty of rest and fluids. Continue cough medicine as up to now. Given recent tick exposure and chills, provided with antibiotic to fill if any return of fever or chills or any abdominal pain, headache, muscle or joint aches. Call us if not improving as expected.

## 2010-11-09 NOTE — Progress Notes (Signed)
  Subjective:    Patient ID: Thomas Lopez, male    DOB: [birth year], 77 y.o.   MRN: [id number]  HPI CC: cold sxs?  "terribly sick Saturday night" happened after going out to dinner on Saturday for father's day.  Started having chills, coughing, sneezing.  Sunday had diarrhea, coughing and sneezing continued.  Still sounding hoarse.  Diarrhea now better.  Subjective chills.  Has tried cough syrup which helped some.  Cough slowly improving.  Mildly productive.  No abd pain, n/v, ST, myalgia, arthralgia.  Denies rash, neck stiffness, HA.  Recent tick exposure, got bitten several times 3 wks ago.  No sick contacts or smokers at home.  No h/o asthma/COPD.  Review of Systems Per HPI    Objective:   Physical Exam  Nursing note and vitals reviewed. Constitutional: He appears well-developed and well-nourished. No distress.       Somewhat hoarse  HENT:  Head: Normocephalic and atraumatic.  Right Ear: Tympanic membrane, external ear and ear canal normal.  Left Ear: Tympanic membrane, external ear and ear canal normal.  Nose: Nose normal. No mucosal edema or rhinorrhea. Right sinus exhibits no maxillary sinus tenderness and no frontal sinus tenderness. Left sinus exhibits no maxillary sinus tenderness and no frontal sinus tenderness.  Mouth/Throat: Oropharynx is clear and moist. No oropharyngeal exudate.  Eyes: Conjunctivae and EOM are normal. Pupils are equal, round, and reactive to light. No scleral icterus.  Neck: Normal range of motion. Neck supple. No thyromegaly present.  Cardiovascular: Normal rate, regular rhythm, normal heart sounds and intact distal pulses.   No murmur heard. Pulmonary/Chest: Effort normal and breath sounds normal. No respiratory distress. He has no wheezes. He has no rales.       Mild crackles bibasilarly  Abdominal: Soft. Bowel sounds are normal. He exhibits no distension. There is no hepatosplenomegaly. There is no tenderness. There is no rebound and no guarding.    Lymphadenopathy:    He has no cervical adenopathy.  Skin: Skin is warm and dry. No rash noted.          Assessment & Plan:

## 2011-03-03 ENCOUNTER — Ambulatory Visit (INDEPENDENT_AMBULATORY_CARE_PROVIDER_SITE_OTHER): Payer: Medicare Other

## 2011-03-03 DIAGNOSIS — Z23 Encounter for immunization: Secondary | ICD-10-CM

## 2011-05-01 DIAGNOSIS — H2510 Age-related nuclear cataract, unspecified eye: Secondary | ICD-10-CM | POA: Insufficient documentation

## 2011-06-30 ENCOUNTER — Other Ambulatory Visit: Payer: Self-pay | Admitting: Family Medicine

## 2011-06-30 DIAGNOSIS — E782 Mixed hyperlipidemia: Secondary | ICD-10-CM

## 2011-06-30 DIAGNOSIS — Z125 Encounter for screening for malignant neoplasm of prostate: Secondary | ICD-10-CM

## 2011-06-30 DIAGNOSIS — I10 Essential (primary) hypertension: Secondary | ICD-10-CM

## 2011-07-08 ENCOUNTER — Other Ambulatory Visit (INDEPENDENT_AMBULATORY_CARE_PROVIDER_SITE_OTHER): Payer: Medicare Other

## 2011-07-08 DIAGNOSIS — Z125 Encounter for screening for malignant neoplasm of prostate: Secondary | ICD-10-CM

## 2011-07-08 DIAGNOSIS — I10 Essential (primary) hypertension: Secondary | ICD-10-CM

## 2011-07-08 DIAGNOSIS — E782 Mixed hyperlipidemia: Secondary | ICD-10-CM

## 2011-07-08 LAB — BASIC METABOLIC PANEL
CO2: 28 mEq/L (ref 19–32)
Calcium: 9.3 mg/dL (ref 8.4–10.5)
GFR: 117.27 mL/min (ref >60.00)
Glucose, Bld: 103 mg/dL — ABNORMAL HIGH (ref 70–99)

## 2011-07-08 LAB — PSA: PSA: 1.13 ng/mL (ref 0.10–4.00)

## 2011-07-08 LAB — LIPID PANEL
LDL Cholesterol: 117 mg/dL — ABNORMAL HIGH (ref 0–99)
Triglycerides: 86 mg/dL (ref 0.0–149.0)
VLDL: 17.2 mg/dL (ref 0.0–40.0)

## 2011-07-15 ENCOUNTER — Encounter: Payer: Self-pay | Admitting: Family Medicine

## 2011-07-15 ENCOUNTER — Ambulatory Visit (INDEPENDENT_AMBULATORY_CARE_PROVIDER_SITE_OTHER): Payer: Medicare Other | Admitting: Family Medicine

## 2011-07-15 VITALS — BP 130/84 | HR 51 | Temp 97.5°F | Ht 70.0 in | Wt 169.1 lb

## 2011-07-15 DIAGNOSIS — C61 Malignant neoplasm of prostate: Secondary | ICD-10-CM | POA: Insufficient documentation

## 2011-07-15 DIAGNOSIS — Z860100 Personal history of colon polyps, unspecified: Secondary | ICD-10-CM

## 2011-07-15 DIAGNOSIS — Z0000 Encounter for general adult medical examination without abnormal findings: Secondary | ICD-10-CM | POA: Insufficient documentation

## 2011-07-15 DIAGNOSIS — E749 Disorder of carbohydrate metabolism, unspecified: Secondary | ICD-10-CM

## 2011-07-15 DIAGNOSIS — E782 Mixed hyperlipidemia: Secondary | ICD-10-CM

## 2011-07-15 DIAGNOSIS — H409 Unspecified glaucoma: Secondary | ICD-10-CM

## 2011-07-15 DIAGNOSIS — I10 Essential (primary) hypertension: Secondary | ICD-10-CM

## 2011-07-15 DIAGNOSIS — Z8546 Personal history of malignant neoplasm of prostate: Secondary | ICD-10-CM

## 2011-07-15 MED ORDER — ROSUVASTATIN CALCIUM 10 MG PO TABS: 10.0000 mg | ORAL_TABLET | Freq: Every day | ORAL | Status: DC

## 2011-07-15 MED ORDER — LISINOPRIL 10 MG PO TABS: 10.0000 mg | ORAL_TABLET | Freq: Every day | ORAL | Status: DC

## 2011-07-15 MED ORDER — OMEPRAZOLE 40 MG PO CPDR: 40.0000 mg | DELAYED_RELEASE_CAPSULE | Freq: Every day | ORAL | Status: DC

## 2011-07-15 NOTE — Assessment & Plan Note (Signed)
Stable exam.  psa trending up. Did discuss would recommend f/u with urology given hx. s/p seed implant, radiation 2005 Lab Results  Component Value Date   PSA 1.13 07/08/2011   PSA 0.77 06/25/2010   PSA 0.56 06/24/2009

## 2011-07-15 NOTE — Assessment & Plan Note (Signed)
Next colonoscopy due 2014.

## 2011-07-15 NOTE — Patient Instructions (Signed)
Good to see you today. Cholesterol and sugar are slightly elevated.  Remember watch carb intake. More fruits and vegetables, more fish, less red meat and dairy products.  More soy, nuts, beans, barley, lentils, oats and plant sterol ester enriched margarine instead of butter. I do recommend you return to urologist to be checked every 1-2 years. Good to see you tdoay.  No changes in meds.  Return to see me in 1 year for next medicare checkup, sooner if needed.

## 2011-07-15 NOTE — Assessment & Plan Note (Signed)
Chronic. Good control.  continue regimen.

## 2011-07-15 NOTE — Assessment & Plan Note (Signed)
Followed by ophtho at [facility].

## 2011-07-15 NOTE — Assessment & Plan Note (Signed)
I have personally reviewed the Medicare Annual Wellness questionnaire and have noted 1. The patient's medical and social history 2. Their use of alcohol, tobacco or illicit drugs 3. Their current medications and supplements 4. The patient's functional ability including ADL's, fall risks, home safety risks and hearing or visual impairment. 5. Diet and physical activities 6. Evidence for depression or mood disorders The patients weight, height, BMI have been recorded in the chart.  Hearing and vision has been addressed. I have made referrals, counseling and provided education to the patient based review of the above and I have provided the pt with a written personalized care plan for preventive services.  Normal snellen. Hearing at 40 db Discussed advanced directives

## 2011-07-15 NOTE — Progress Notes (Signed)
Subjective:    Patient ID: Thomas Lopez, male    DOB: [birth year], 78 y.o.   MRN: [id number]  HPI CC: medicare wellness visit  Transfer from Dr. Hetty Ely No concerns today. Requests 3 mo supply of all meds.  Preventative: H/o prostate cancer - has not followed up with urology.  S/p radiation and seed implant, last 2005.  Wonders if needs to f/u w/ urology. H/o colon polyps, last colonoscopy 2009, rec rpt 5 yrs. Flu 2012 Pneumovax 2003 Td 2008 zostavax 06/2010 H/o glaucoma, followed by eye doctor.  On 2 different drops.  Denies problems with vision.  Last saw ophtho 05/2011.  To return in 08/2011. Denies problems with hearing. Advanced directives - unsure if has living will.  Has youngest daughter designated as proxy for health decisions in case he cannot make them  Denies anhedonia, depression.  Likes to bowl and restore volswagons.  Mows yards as well. No falls in last year.  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  Past histories reviewed and updated if relevant as below. Patient Active Problem List  Diagnoses  . UNSPEC DISORDER CARBOHYDRATE TRANSPORT&METAB  . MIXED HYPERLIPIDEMIA  . UNSPECIFIED GLAUCOMA  . ESSENTIAL HYPERTENSION  . ESOPHAGITIS  . IRRITABLE BOWEL SYNDROME  . ERECTILE DYSFUNCTION, ORGANIC  . COLONIC POLYPS, ADENOMATOUS, HX OF   Past Medical History  Diagnosis Date  . HTN (hypertension)   . HLD (hyperlipidemia)   . Hyperglycemia   . Esophagitis   . ED (erectile dysfunction)   . IBS (irritable bowel syndrome)   . Colon polyps     rpt colonoscopy due 2014  . Glaucoma   . History of prostate cancer 2005    s/p seed implant, radiation   Past Surgical History  Procedure Date  . Wrist release 6/98; 9/99    Right  . Ncs/wrists left carpal 2/02    Min./right improved  . Esophagogastroduodenoscopy 03/01/01    H.H.; gastritis esoph. dudodenitis  . Prostate biopsy 12/04      positive; radioactive seed implant (Dr. Etta Grandchild)  . Colonoscopy 03/01/01    Multiple polyps, divertic//adenomatous polyps  . Colonoscopy 04/24/01    multiple polyps  . Colonoscopy 10/23/01    Polyps benign-repeat every 2 years  . Colonoscopy 06/23/04    Adenom/hyperplastic polyps; multiple external hemorrhoids  . Colonoscopy 06/08/07    single small polyp-repeat 5 years  . Prostate ext beam radiation 1/27-07/23/03    Dr. Dan Humphreys   History  Substance Use Topics  . Smoking status: Former Smoker    Quit date: 05/23/1993  . Smokeless tobacco: Not on file  . Alcohol Use: Yes     Rare   Family History  Problem Relation Age of Onset  . Colon cancer Mother   . Hyperlipidemia Mother   . Other Mother     Carotid disease   No Known Allergies Current Outpatient Prescriptions on File Prior to Visit  Medication Sig Dispense Refill  . methylcellulose packet Take 1 each by mouth daily.        . timolol (TIMOPTIC) 0.25 % ophthalmic solution Place 1 drop into both eyes 2 (two) times daily.          Review of Systems  Constitutional: Negative for fever, chills, activity change, appetite change, fatigue and unexpected weight change.  HENT: Negative for hearing loss and neck pain.   Eyes: Negative for visual disturbance.  Respiratory: Negative for cough, chest tightness, shortness of breath and  wheezing.   Cardiovascular: Negative for chest pain, palpitations and leg swelling.  Gastrointestinal: Negative for nausea, vomiting, abdominal pain, diarrhea, constipation, blood in stool and abdominal distention.  Genitourinary: Negative for hematuria and difficulty urinating.  Musculoskeletal: Negative for myalgias and arthralgias.  Skin: Negative for rash.  Neurological: Negative for dizziness, seizures, syncope and headaches.  Hematological: Does not bruise/bleed easily.  Psychiatric/Behavioral: Negative for dysphoric mood. The patient is not nervous/anxious.        Objective:   Physical Exam   Nursing note and vitals reviewed. Constitutional: He is oriented to person, place, and time. He appears well-developed and well-nourished. No distress.  HENT:  Head: Normocephalic and atraumatic.  Right Ear: External ear normal.  Left Ear: External ear normal.  Nose: Nose normal.  Mouth/Throat: Oropharynx is clear and moist. No oropharyngeal exudate.  Eyes: Conjunctivae and EOM are normal. Pupils are equal, round, and reactive to light. No scleral icterus.  Neck: Normal range of motion. Neck supple.  Cardiovascular: Normal rate, regular rhythm, normal heart sounds and intact distal pulses.   No murmur heard. Pulses:      Radial pulses are 2+ on the right side, and 2+ on the left side.  Pulmonary/Chest: Effort normal and breath sounds normal. No respiratory distress. He has no wheezes. He has no rales.  Abdominal: Soft. Bowel sounds are normal. He exhibits no distension and no mass. There is no tenderness. There is no rebound and no guarding.  Genitourinary: Rectum normal and prostate normal. Rectal exam shows no external hemorrhoid, no internal hemorrhoid, no fissure, no mass, no tenderness and anal tone normal. Prostate is not enlarged and not tender.  Musculoskeletal: Normal range of motion. He exhibits no edema.  Lymphadenopathy:    He has no cervical adenopathy.  Neurological: He is alert and oriented to person, place, and time.       CN grossly intact, station and gait intact  Skin: Skin is warm and dry. No rash noted.  Psychiatric: He has a normal mood and affect. His behavior is normal. Judgment and thought content normal.      Assessment & Plan:

## 2011-07-15 NOTE — Assessment & Plan Note (Signed)
Discussed avoiding carbs/sweets.

## 2011-07-15 NOTE — Assessment & Plan Note (Signed)
Discussed LDL slightly elevated, however goal likely <130.

## 2011-09-21 HISTORY — PX: SPIROMETRY: SHX456

## 2011-09-27 ENCOUNTER — Ambulatory Visit (INDEPENDENT_AMBULATORY_CARE_PROVIDER_SITE_OTHER): Payer: Medicare Other | Admitting: Family Medicine

## 2011-09-27 ENCOUNTER — Encounter: Payer: Self-pay | Admitting: Family Medicine

## 2011-09-27 VITALS — BP 136/74 | HR 56 | Temp 97.0°F | Wt 167.8 lb

## 2011-09-27 DIAGNOSIS — Z8546 Personal history of malignant neoplasm of prostate: Secondary | ICD-10-CM

## 2011-09-27 DIAGNOSIS — I10 Essential (primary) hypertension: Secondary | ICD-10-CM

## 2011-09-27 DIAGNOSIS — Z87891 Personal history of nicotine dependence: Secondary | ICD-10-CM

## 2011-09-27 DIAGNOSIS — R062 Wheezing: Secondary | ICD-10-CM

## 2011-09-27 DIAGNOSIS — R05 Cough: Secondary | ICD-10-CM

## 2011-09-27 NOTE — Progress Notes (Signed)
  Subjective:    Patient ID: Thomas Lopez, male    DOB: [birth year], 78 y.o.   MRN: [id number]  HPI CC: several issues, question about aspirin and allergies.  Had house call by RN.  Brought up several questions.  Wonders if needs aspirin.  framingham risk 25%.  ?allergies vs COPD - gets very hoarse with damp dreary weather as well as has cough that gets worse at night.  Especially bad with weather changes.  Pt denies chest tightness or SOB.  No fevers/chills recently.  No DOE.  But thinks has been wheezy in past.  Does endorse nasal congestion, rhinorrhea.  No watery eyes or sneezing.  GERD - Takes omeprazole daily.  When misses, doesn't notice much change.  Quit smoking 1995.  Also asks if needs to f/u with urology - h/o prostate cancer, currently with seeds in place.  Past Medical History  Diagnosis Date  . HTN (hypertension)   . HLD (hyperlipidemia)   . Hyperglycemia   . Esophagitis   . ED (erectile dysfunction)   . IBS (irritable bowel syndrome)   . Colon polyps     rpt colonoscopy due 2014  . Glaucoma   . History of prostate cancer 2005    s/p seed implant, radiation    Review of Systems Per HPI    Objective:   Physical Exam  Nursing note and vitals reviewed. Constitutional: He appears well-developed and well-nourished. No distress.  HENT:  Head: Normocephalic and atraumatic.  Right Ear: Hearing, tympanic membrane, external ear and ear canal normal.  Left Ear: Hearing, tympanic membrane, external ear and ear canal normal.  Nose: Mucosal edema present. No rhinorrhea. Right sinus exhibits no maxillary sinus tenderness and no frontal sinus tenderness. Left sinus exhibits no maxillary sinus tenderness and no frontal sinus tenderness.  Mouth/Throat: Uvula is midline, oropharynx is clear and moist and mucous membranes are normal. No oropharyngeal exudate, posterior oropharyngeal edema, posterior oropharyngeal erythema or tonsillar abscesses.       No cobblestoning of  oropharynx Mild nasal mucosal irritation bilaterally  Eyes: Conjunctivae and EOM are normal. Pupils are equal, round, and reactive to light.  Neck: Normal range of motion. Neck supple.  Cardiovascular: Normal rate, regular rhythm, normal heart sounds and intact distal pulses.   No murmur heard. Pulmonary/Chest: Effort normal. No respiratory distress. He has no wheezes. He has no rales.       Coarse, slightly distant, but no evident wheeze.   Musculoskeletal: He exhibits no edema.  Skin: Skin is warm and dry. No rash noted.  Psychiatric: He has a normal mood and affect.       Assessment & Plan:

## 2011-09-27 NOTE — Patient Instructions (Addendum)
Decrease omeprazole to every other day. Start enteric coated baby aspirin. Pass by [facility]'s office to make appointment with urology to follow history of prostate cancer. Breathing test today. Good to see you today, call us with questions. Trial of daily zyrtec, claritin or allegra.

## 2011-09-29 ENCOUNTER — Encounter: Payer: Self-pay | Admitting: Family Medicine

## 2011-09-29 DIAGNOSIS — R0689 Other abnormalities of breathing: Secondary | ICD-10-CM | POA: Insufficient documentation

## 2011-09-29 DIAGNOSIS — Z87891 Personal history of nicotine dependence: Secondary | ICD-10-CM | POA: Insufficient documentation

## 2011-09-29 NOTE — Assessment & Plan Note (Signed)
Anticipate allergic rhinitis - but does have smoking history.  Check spirometry today to eval for obstruction - normal spirometry is reassuring.. Discussed use of OTC antihistamines

## 2011-09-29 NOTE — Assessment & Plan Note (Signed)
H/o esophagitis, duodenitis and gastritis on EGD 2002.  Seems like has been on longterm PPI since then.   Discussed backing off PPI to see if asxs, start with QOD PPI dosing.

## 2011-09-29 NOTE — Assessment & Plan Note (Signed)
Will make referral to urology per pt preference

## 2011-09-29 NOTE — Assessment & Plan Note (Signed)
Chronic, controlled on lisinopril. framingham risk 25%.   rec start enteric coated baby aspirin 81mg  daily.

## 2011-10-06 ENCOUNTER — Encounter: Payer: Self-pay | Admitting: Family Medicine

## 2012-02-29 ENCOUNTER — Ambulatory Visit (INDEPENDENT_AMBULATORY_CARE_PROVIDER_SITE_OTHER): Payer: Medicare Other

## 2012-02-29 DIAGNOSIS — Z23 Encounter for immunization: Secondary | ICD-10-CM

## 2012-05-21 ENCOUNTER — Encounter: Payer: Self-pay | Admitting: Gastroenterology

## 2012-08-01 ENCOUNTER — Other Ambulatory Visit (INDEPENDENT_AMBULATORY_CARE_PROVIDER_SITE_OTHER): Payer: Medicare Other

## 2012-08-01 ENCOUNTER — Other Ambulatory Visit: Payer: Self-pay | Admitting: Family Medicine

## 2012-08-01 DIAGNOSIS — I10 Essential (primary) hypertension: Secondary | ICD-10-CM

## 2012-08-01 LAB — LIPID PANEL
Cholesterol: 155 mg/dL (ref 0–200)
HDL: 38.5 mg/dL — ABNORMAL LOW (ref >39.00)
LDL Cholesterol: 99 mg/dL (ref 0–99)
VLDL: 17.8 mg/dL (ref 0.0–40.0)

## 2012-08-01 LAB — BASIC METABOLIC PANEL
BUN: 12 mg/dL (ref 6–23)
Calcium: 8.9 mg/dL (ref 8.4–10.5)
GFR: 112.19 mL/min (ref >60.00)
Glucose, Bld: 108 mg/dL — ABNORMAL HIGH (ref 70–99)
Sodium: 141 mEq/L (ref 135–145)

## 2012-08-01 LAB — PSA: PSA: 1.54 ng/mL (ref 0.10–4.00)

## 2012-08-07 ENCOUNTER — Encounter: Payer: Self-pay | Admitting: Gastroenterology

## 2012-08-08 ENCOUNTER — Ambulatory Visit (INDEPENDENT_AMBULATORY_CARE_PROVIDER_SITE_OTHER): Payer: Medicare Other | Admitting: Family Medicine

## 2012-08-08 ENCOUNTER — Encounter: Payer: Self-pay | Admitting: Family Medicine

## 2012-08-08 ENCOUNTER — Ambulatory Visit (INDEPENDENT_AMBULATORY_CARE_PROVIDER_SITE_OTHER)
Admission: RE | Admit: 2012-08-08 | Discharge: 2012-08-08 | Disposition: A | Discharge status: Home / Self Care | Payer: Medicare Other | Source: Ambulatory Visit | Attending: Family Medicine | Admitting: Family Medicine

## 2012-08-08 VITALS — BP 158/82 | HR 59 | Temp 97.4°F | Ht 68.5 in | Wt 166.2 lb

## 2012-08-08 DIAGNOSIS — Z0000 Encounter for general adult medical examination without abnormal findings: Secondary | ICD-10-CM

## 2012-08-08 MED ORDER — LISINOPRIL-HYDROCHLOROTHIAZIDE 10-12.5 MG PO TABS: 1.0000 | ORAL_TABLET | Freq: Every day | ORAL | Status: DC

## 2012-08-08 MED ORDER — ROSUVASTATIN CALCIUM 10 MG PO TABS: 10.0000 mg | ORAL_TABLET | Freq: Every day | ORAL | Status: DC

## 2012-08-08 NOTE — Progress Notes (Signed)
Subjective:    Patient ID: Thomas Lopez, male    DOB: 1932/11/22, 79 y.o.   MRN: [id number]  HPI CC: medicare wellness  HTN - bp elevated today.  Keeps track of BP at home.   BP Readings from Last 3 Encounters:  08/08/12 158/82  09/27/11 136/74  07/15/11 130/84   HLD - no myalgias, tolerating crestor well. GERD - controlled with PRN PPI.  Preventative:  H/o prostate cancer - S/p radiation and seed implant, last 2005. Saw Dr. Laverle Patter 09/2011, told things were going well.  Had DRE done there. H/o colon polyps, last colonoscopy 2009, rec rpt 5 yrs. To have colonoscopy in next few weeks. Flu shot - 2013 Pneumovax 2003  Td 2008  zostavax 06/2010  Advanced directives - unsure if has living will. Has youngest daughter designated as proxy for health decisions in case he cannot make them.  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 Denies depression/anhedonia. Denies problems with hearing.  Vision screen - 20/70 on R eye.  Sees eye doctor Q3 mo.  Last saw 07/21/2012 H/o glaucoma, followed by eye doctor. On 2 different drops. Denies problems with vision.  Medications and allergies reviewed and updated in chart.  Past histories reviewed and updated if relevant as below. Patient Active Problem List  Diagnosis  . Hyperglycemia  . Mixed hyperlipidemia  . Unspecified glaucoma  . ESSENTIAL HYPERTENSION  . ESOPHAGITIS  . IRRITABLE BOWEL SYNDROME  . ERECTILE DYSFUNCTION, ORGANIC  . COLONIC POLYPS, ADENOMATOUS, HX OF  . Medicare annual wellness visit, initial  . History of prostate cancer  . Cough  . History of smoking   Past Medical History  Diagnosis Date  . HTN (hypertension)   . HLD (hyperlipidemia)   . Hyperglycemia   . Esophagitis   . ED (erectile dysfunction)   . IBS (irritable bowel syndrome)   . Colon polyps     rpt  colonoscopy due 2014  . Glaucoma(365)   . History of prostate cancer 2005    s/p seed implant, radiation (now followed by Dr. [facility]le Patter at [facility])  . Smoking history    Past Surgical History  Procedure Laterality Date  . Wrist release  6/98; 9/99    Right  . Ncs/wrists left carpal  2/02    Min./right improved  . Esophagogastroduodenoscopy  03/01/01    H.H.; gastritis esoph. dudodenitis  . Prostate biopsy  12/04    positive; radioactive seed implant (Dr. Etta Grandchild)  . Colonoscopy  03/01/01    Multiple polyps, divertic//adenomatous polyps  . Colonoscopy  04/24/01    multiple polyps  . Colonoscopy  10/23/01    Polyps benign-repeat every 2 years  . Colonoscopy  06/23/04    Adenom/hyperplastic polyps; multiple external hemorrhoids  . Colonoscopy  06/08/07    single small polyp-repeat 5 years  . Prostate ext beam radiation  1/27-07/23/03    Dr. Dan Humphreys  . Spirometry  09/2011    WNL   History  Substance Use Topics  . Smoking status: Former Smoker    Quit date: 05/23/1993  . Smokeless tobacco: Never Used  . Alcohol Use: Yes     Comment: Rare   Family History  Problem Relation Age of Onset  . Colon cancer Mother   . Hyperlipidemia Mother   . Other Mother     Carotid disease  . Coronary artery disease Neg Hx   .  Stroke Neg Hx    No Known Allergies Current Outpatient Prescriptions on File Prior to Visit  Medication Sig Dispense Refill  . aspirin EC 81 MG tablet Take 81 mg by mouth daily.      Marland Kitchen lisinopril (PRINIVIL,ZESTRIL) 10 MG tablet Take 1 tablet (10 mg total) by mouth daily.  90 tablet  3  . methylcellulose packet Take 1 each by mouth daily.        Marland Kitchen omeprazole (PRILOSEC) 40 MG capsule Take 40 mg by mouth every other day.      . rosuvastatin (CRESTOR) 10 MG tablet Take 1 tablet (10 mg total) by mouth daily.  90 tablet  3  . timolol (TIMOPTIC) 0.25 % ophthalmic solution Place 1 drop into both eyes 2 (two) times daily.         No current facility-administered medications on file  prior to visit.     Review of Systems  Constitutional: Negative for fever, chills, activity change, appetite change, fatigue and unexpected weight change.  HENT: Negative for hearing loss and neck pain.   Eyes: Negative for visual disturbance.  Respiratory: Negative for cough, chest tightness, shortness of breath and wheezing.   Cardiovascular: Negative for chest pain, palpitations and leg swelling.  Gastrointestinal: Negative for nausea, vomiting, abdominal pain, diarrhea, constipation, blood in stool and abdominal distention.  Genitourinary: Negative for hematuria and difficulty urinating.  Musculoskeletal: Negative for myalgias and arthralgias.  Skin: Negative for rash.  Neurological: Negative for dizziness, seizures, syncope and headaches.  Hematological: Does not bruise/bleed easily.  Psychiatric/Behavioral: Negative for dysphoric mood. The patient is not nervous/anxious.        Objective:   Physical Exam  Nursing note and vitals reviewed. Constitutional: He is oriented to person, place, and time. He appears well-developed and well-nourished. No distress.  HENT:  Head: Normocephalic and atraumatic.  Right Ear: Hearing, tympanic membrane, external ear and ear canal normal.  Left Ear: Hearing, tympanic membrane, external ear and ear canal normal.  Nose: Nose normal.  Mouth/Throat: Oropharynx is clear and moist. No oropharyngeal exudate.  Eyes: Conjunctivae and EOM are normal. Pupils are equal, round, and reactive to light. No scleral icterus.  Neck: Normal range of motion. Neck supple. Carotid bruit is not present. No thyromegaly present.  Cardiovascular: Normal rate, regular rhythm, normal heart sounds and intact distal pulses.   No murmur heard. Pulses:      Radial pulses are 2+ on the right side, and 2+ on the left side.  Pulmonary/Chest: Effort normal. No respiratory distress. He has no wheezes. He has rhonchi (left sided). He has no rales.  coarse  Abdominal: Soft. Bowel  sounds are normal. He exhibits no distension and no mass. There is no tenderness. There is no rebound and no guarding.  Genitourinary:  deferred  Musculoskeletal: Normal range of motion. He exhibits no edema.  Lymphadenopathy:    He has no cervical adenopathy.  Neurological: He is alert and oriented to person, place, and time.  CN grossly intact, station and gait intact  Skin: Skin is warm and dry. No rash noted.  Psychiatric: He has a normal mood and affect. His behavior is normal. Judgment and thought content normal.      Assessment & Plan:

## 2012-08-08 NOTE — Assessment & Plan Note (Signed)
Elevated today, per pt elevated at home as well. Will change to combo pill. Continue aspirin daily.

## 2012-08-08 NOTE — Assessment & Plan Note (Signed)
rhonchorous today, in h/o smoker.  check cxr today.

## 2012-08-08 NOTE — Assessment & Plan Note (Signed)
I have personally reviewed the Medicare Annual Wellness questionnaire and have noted 1. The patient's medical and social history 2. Their use of alcohol, tobacco or illicit drugs 3. Their current medications and supplements 4. The patient's functional ability including ADL's, fall risks, home safety risks and hearing or visual impairment. 5. Diet and physical activity 6. Evidence for depression or mood disorders The patients weight, height, BMI have been recorded in the chart.  Hearing and vision has been addressed. I have made referrals, counseling and provided education to the patient based review of the above and I have provided the pt with a written personalized care plan for preventive services. See scanned questionairre. Advanced directives discussed: packet provided today.  Would want youngest daughter to be HCPOA.  Reviewed preventative protocols and updated unless pt declined. UTD immunizations. Followed for h/o prostate cancer by Dr. Laverle Patter Upcoming colonoscopy for h/o polyps.

## 2012-08-08 NOTE — Assessment & Plan Note (Signed)
Chronic, stable. Continue crestor.  

## 2012-08-08 NOTE — Patient Instructions (Addendum)
Let's change blood pressure medicine to a combination pill - lisinopril 10mg  daily plus hydrochlorothiazide 12.5mg  daily.  May take at the same time. May continue omeprazole as needed. Work on more water and fruits/vegetables. Good to see you today, call us with questions.

## 2012-08-08 NOTE — Addendum Note (Signed)
Addended by: Eustaquio Boyden on: 08/08/2012 09:47 AM   Modules accepted: Level of Service

## 2012-08-08 NOTE — Assessment & Plan Note (Signed)
Re-established with Dr. Laverle Patter.

## 2012-08-08 NOTE — Assessment & Plan Note (Signed)
Discussed avoiding added sugars and simple carbs. 

## 2012-09-10 ENCOUNTER — Ambulatory Visit (AMBULATORY_SURGERY_CENTER): Payer: Medicare Other | Admitting: *Deleted

## 2012-09-10 ENCOUNTER — Encounter: Payer: Self-pay | Admitting: Gastroenterology

## 2012-09-10 VITALS — Ht 68.0 in | Wt 165.0 lb

## 2012-09-10 DIAGNOSIS — Z1211 Encounter for screening for malignant neoplasm of colon: Secondary | ICD-10-CM

## 2012-09-10 MED ORDER — MOVIPREP 100 G PO SOLR: ORAL | Status: DC

## 2012-09-10 NOTE — Progress Notes (Signed)
Emmi program entered

## 2012-09-24 ENCOUNTER — Encounter: Payer: Self-pay | Admitting: Gastroenterology

## 2012-09-24 ENCOUNTER — Ambulatory Visit (AMBULATORY_SURGERY_CENTER): Payer: Medicare Other | Admitting: Gastroenterology

## 2012-09-24 VITALS — BP 135/81 | HR 131 | Temp 96.9°F | Resp 26 | Ht 68.0 in | Wt 165.0 lb

## 2012-09-24 DIAGNOSIS — Z86010 Personal history of colonic polyps: Secondary | ICD-10-CM

## 2012-09-24 DIAGNOSIS — Z800 Family history of malignant neoplasm of digestive organs: Secondary | ICD-10-CM

## 2012-09-24 DIAGNOSIS — Z1211 Encounter for screening for malignant neoplasm of colon: Secondary | ICD-10-CM

## 2012-09-24 MED ORDER — SODIUM CHLORIDE 0.9 % IV SOLN: 500.0000 mL | INTRAVENOUS | Status: DC

## 2012-09-24 NOTE — Progress Notes (Signed)
Patient did not have preoperative order for IV antibiotic SSI prophylaxis. (G8918)Patient did not experience any of the following events: a burn prior to discharge; a fall within the facility; wrong site/side/patient/procedure/implant event; or a hospital transfer or hospital admission upon discharge from the facility. [phone]) ewm

## 2012-09-24 NOTE — Op Note (Signed)
[facility] [address] [place], [address]   COLONOSCOPY PROCEDURE REPORT  PATIENT: Thomas Lopez, Thomas Lopez  MR#: [id number] BIRTHDATE: February 09, 1933 , 79  yrs. old GENDER: Male ENDOSCOPIST: Rachael Fee, MD PROCEDURE DATE:  09/24/2012 PROCEDURE:   Colonoscopy, incomplete ASA CLASS:   Class II INDICATIONS:FH of colon cancer (mother), he had small TA removed in 2009. MEDICATIONS: Fentanyl 50 mcg IV, Versed 6 mg IV, and These medications were titrated to patient response per physician's verbal order  DESCRIPTION OF PROCEDURE:   After the risks benefits and alternatives of the procedure were thoroughly explained, informed consent was obtained.  A digital rectal exam revealed no abnormalities of the rectum.   The LB [id number]  endoscope was introduced through the anus and advanced to the sigmoid colon. No adverse events experienced.   Limited by a tortuous colon (see below).   The quality of the prep was good.  The instrument was then slowly withdrawn as the colon was fully examined.   COLON FINDINGS: The sigmoid colon was very tortuous.  While attempting to pass a particularly difficult twist I noticed a small mucosa tear along the wall of the colon.  This also occured (in same segment) during complete 2009 colonoscopy.  I switched to pediatric colonoscope however I was still not able to advance the scope past the sigmoid twist and given the mucosal tear, I terminated the exam.  Retroflexed views revealed no abnormalities. The time to cecum=minutes 0 seconds.  Withdrawal time=  .  The scope was withdrawn and the procedure completed. COMPLICATIONS: There were no complications.  ENDOSCOPIC IMPRESSION: The sigmoid colon was very tortuous.  While attempting to pass a particularly difficult twist I noticed a small mucosa tear along the wall of the colon.  This also occured (in same segment) during complete 2009 colonoscopy.  I switched to pediatric  colonoscope however I was still not able to advance the scope past the sigmoid twist and given the mucosal tear, I terminated the exam.  RECOMMENDATIONS: My office will call to set up screening via other method, using CT colonography ("virtual colonoscopy").   eSigned:  Rachael Fee, MD 09/24/2012 10:44 AM   cc: Tomasa Hosteller, MD

## 2012-09-24 NOTE — Progress Notes (Signed)
Changed scopes at 5.34 minutes

## 2012-09-24 NOTE — Patient Instructions (Addendum)
YOU HAD AN ENDOSCOPIC PROCEDURE TODAY AT THE [facility]: Refer to the procedure report that was given to you for any specific questions about what was found during the examination.  If the procedure report does not answer your questions, please call your gastroenterologist to clarify.  If you requested that your care partner not be given the details of your procedure findings, then the procedure report has been included in a sealed envelope for you to review at your convenience later.  YOU SHOULD EXPECT: Some feelings of bloating in the abdomen. Passage of more gas than usual.  Walking can help get rid of the air that was put into your GI tract during the procedure and reduce the bloating. If you had a lower endoscopy (such as a colonoscopy or flexible sigmoidoscopy) you may notice spotting of blood in your stool or on the toilet paper. If you underwent a bowel prep for your procedure, then you may not have a normal bowel movement for a few days.  DIET: Your first meal following the procedure should be a light meal and then it is ok to progress to your normal diet.  A half-sandwich or bowl of soup is an example of a good first meal.  Heavy or fried foods are harder to digest and may make you feel nauseous or bloated.  Likewise meals heavy in dairy and vegetables can cause extra gas to form and this can also increase the bloating.  Drink plenty of fluids but you should avoid alcoholic beverages for 24 hours.  ACTIVITY: Your care partner should take you home directly after the procedure.  You should plan to take it easy, moving slowly for the rest of the day.  You can resume normal activity the day after the procedure however you should NOT DRIVE or use heavy machinery for 24 hours (because of the sedation medicines used during the test).    SYMPTOMS TO REPORT IMMEDIATELY: A gastroenterologist can be reached at any hour.  During normal business hours, 8:30 AM to 5:00 PM Monday through Friday,  call [phone].  After hours and on weekends, please call the GI answering service at [phone] doctor on call  who will take a message and have the physician on call contact you.   Following lower endoscopy (colonoscopy or flexible sigmoidoscopy):  Excessive amounts of blood in the stool  Significant tenderness or worsening of abdominal pains  Swelling of the abdomen that is new, acute  Fever of 100F or higher  FOLLOW UP: If any biopsies were taken you will be contacted by phone or by letter within the next 1-3 weeks.  Call your gastroenterologist if you have not heard about the biopsies in 3 weeks.  Our staff will call the home number listed on your records the next business day following your procedure to check on you and address any questions or concerns that you may have at that time regarding the information given to you following your procedure. This is a courtesy call and so if there is no answer at the home number and we have not heard from you through the emergency physician on call, we will assume that you have returned to your regular daily activities without incident.  SIGNATURES/CONFIDENTIALITY: You and/or your care partner have signed paperwork which will be entered into your electronic medical record.  These signatures attest to the fact that that the information above on your After Visit Summary has been reviewed and is understood.  Full responsibility of  the confidentiality of this discharge information lies with you and/or your care-partner.  Dr Larae Grooms office will schedule a virtual colonoscopy and call you with the date and time of this exam

## 2012-09-25 ENCOUNTER — Telehealth: Payer: Self-pay | Admitting: *Deleted

## 2012-09-25 NOTE — Telephone Encounter (Signed)
  Follow up Call-  Call back number 09/24/2012  Post procedure Call Back phone  # [phone]  Permission to leave phone message Yes     Patient questions:  Do you have a fever, pain , or abdominal swelling? no Pain Score  0 *  Have you tolerated food without any problems? yes  Have you been able to return to your normal activities? yes  Do you have any questions about your discharge instructions: Diet   no Medications  no Follow up visit  no  Do you have questions or concerns about your Care? no  Actions: * If pain score is 4 or above: No action needed, pain <4.

## 2012-10-21 HISTORY — PX: OTHER SURGICAL HISTORY: SHX169

## 2012-10-31 LAB — PULMONARY FUNCTION TEST

## 2012-11-02 ENCOUNTER — Encounter: Payer: Self-pay | Admitting: Family Medicine

## 2012-11-07 ENCOUNTER — Encounter: Payer: Self-pay | Admitting: Family Medicine

## 2012-12-15 DIAGNOSIS — H401190 Primary open-angle glaucoma, unspecified eye, stage unspecified: Secondary | ICD-10-CM | POA: Insufficient documentation

## 2012-12-31 DIAGNOSIS — H524 Presbyopia: Secondary | ICD-10-CM | POA: Insufficient documentation

## 2013-02-06 ENCOUNTER — Other Ambulatory Visit: Payer: Self-pay | Admitting: Family Medicine

## 2013-02-26 ENCOUNTER — Ambulatory Visit (INDEPENDENT_AMBULATORY_CARE_PROVIDER_SITE_OTHER): Payer: Medicare Other

## 2013-02-26 DIAGNOSIS — Z23 Encounter for immunization: Secondary | ICD-10-CM

## 2013-04-05 ENCOUNTER — Other Ambulatory Visit: Payer: Self-pay | Admitting: Family Medicine

## 2013-04-05 ENCOUNTER — Encounter: Payer: Self-pay | Admitting: Family Medicine

## 2013-07-31 ENCOUNTER — Other Ambulatory Visit: Payer: Self-pay | Admitting: Family Medicine

## 2013-07-31 DIAGNOSIS — I10 Essential (primary) hypertension: Secondary | ICD-10-CM

## 2013-07-31 DIAGNOSIS — Z8546 Personal history of malignant neoplasm of prostate: Secondary | ICD-10-CM

## 2013-07-31 DIAGNOSIS — E782 Mixed hyperlipidemia: Secondary | ICD-10-CM

## 2013-08-02 ENCOUNTER — Other Ambulatory Visit (INDEPENDENT_AMBULATORY_CARE_PROVIDER_SITE_OTHER): Payer: Medicare Other

## 2013-08-02 DIAGNOSIS — I10 Essential (primary) hypertension: Secondary | ICD-10-CM

## 2013-08-02 DIAGNOSIS — E782 Mixed hyperlipidemia: Secondary | ICD-10-CM

## 2013-08-02 DIAGNOSIS — Z8546 Personal history of malignant neoplasm of prostate: Secondary | ICD-10-CM

## 2013-08-02 LAB — BASIC METABOLIC PANEL
BUN: 13 mg/dL (ref 6–23)
CHLORIDE: 107 meq/L (ref 96–112)
CO2: 28 mEq/L (ref 19–32)
Calcium: 9.3 mg/dL (ref 8.4–10.5)
Creatinine, Ser: 0.9 mg/dL (ref 0.4–1.5)
GFR: 111.9 mL/min (ref >60.00)
GLUCOSE: 96 mg/dL (ref 70–99)
POTASSIUM: 4.5 meq/L (ref 3.5–5.1)
Sodium: 140 mEq/L (ref 135–145)

## 2013-08-02 LAB — TSH: TSH: 1.96 u[IU]/mL (ref 0.35–5.50)

## 2013-08-02 LAB — LIPID PANEL
CHOLESTEROL: 133 mg/dL (ref 0–200)
HDL: 40.3 mg/dL (ref >39.00)
LDL Cholesterol: 73 mg/dL (ref 0–99)
TRIGLYCERIDES: 101 mg/dL (ref 0.0–149.0)
Total CHOL/HDL Ratio: 3
VLDL: 20.2 mg/dL (ref 0.0–40.0)

## 2013-08-02 LAB — PSA: PSA: 3.39 ng/mL (ref 0.10–4.00)

## 2013-08-09 ENCOUNTER — Ambulatory Visit (INDEPENDENT_AMBULATORY_CARE_PROVIDER_SITE_OTHER): Payer: Medicare Other | Admitting: Family Medicine

## 2013-08-09 ENCOUNTER — Encounter: Payer: Self-pay | Admitting: Family Medicine

## 2013-08-09 VITALS — BP 124/84 | HR 64 | Temp 97.5°F | Ht 68.5 in | Wt 158.8 lb

## 2013-08-09 DIAGNOSIS — Z8546 Personal history of malignant neoplasm of prostate: Secondary | ICD-10-CM

## 2013-08-09 DIAGNOSIS — R0989 Other specified symptoms and signs involving the circulatory and respiratory systems: Secondary | ICD-10-CM

## 2013-08-09 DIAGNOSIS — E782 Mixed hyperlipidemia: Secondary | ICD-10-CM

## 2013-08-09 DIAGNOSIS — H409 Unspecified glaucoma: Secondary | ICD-10-CM

## 2013-08-09 DIAGNOSIS — I10 Essential (primary) hypertension: Secondary | ICD-10-CM

## 2013-08-09 DIAGNOSIS — R739 Hyperglycemia, unspecified: Secondary | ICD-10-CM

## 2013-08-09 DIAGNOSIS — I6529 Occlusion and stenosis of unspecified carotid artery: Secondary | ICD-10-CM | POA: Insufficient documentation

## 2013-08-09 DIAGNOSIS — Z0000 Encounter for general adult medical examination without abnormal findings: Secondary | ICD-10-CM

## 2013-08-09 DIAGNOSIS — Z23 Encounter for immunization: Secondary | ICD-10-CM

## 2013-08-09 NOTE — Progress Notes (Signed)
Pre visit review using our clinic review tool, if applicable. No additional management support is needed unless otherwise documented below in the visit note. 

## 2013-08-09 NOTE — Assessment & Plan Note (Signed)
Discussed with patient - will order carotid US.  Current modifiable risk factors well controlled.

## 2013-08-09 NOTE — Assessment & Plan Note (Signed)
I have personally reviewed the Medicare Annual Wellness questionnaire and have noted 1. The patient's medical and social history 2. Their use of alcohol, tobacco or illicit drugs 3. Their current medications and supplements 4. The patient's functional ability including ADL's, fall risks, home safety risks and hearing or visual impairment. 5. Diet and physical activity 6. Evidence for depression or mood disorders The patients weight, height, BMI have been recorded in the chart.  Hearing and vision has been addressed. I have made referrals, counseling and provided education to the patient based review of the above and I have provided the pt with a written personalized care plan for preventive services. See scanned questionairre. Advanced directives discussed: would want daughter to be HCproxy  Reviewed preventative protocols and updated unless pt declined. prevnar today.  Recommended f/u with GI for recent unsuccessful colonoscopy.

## 2013-08-09 NOTE — Assessment & Plan Note (Signed)
Follows regularly with ophtho.

## 2013-08-09 NOTE — Assessment & Plan Note (Signed)
Chronic, stable. Continue regimen. 

## 2013-08-09 NOTE — Assessment & Plan Note (Signed)
S/p seed implant and radiation. PSA trending up but still in normal range. BPH on exam today - anticipate likely related to this and not return of prostate cancer but will fax today's results and OV to Dr. Alinda Money

## 2013-08-09 NOTE — Patient Instructions (Addendum)
Prevnar today. Call Dr. Eugenia Pancoast office to inquire about follow up colon cancer screening. Prostate level is slowly increasing but still in normal range.  We will fax today's office note and results of PSA to [facility] and keep appointment at end of year with them. Good to see you today, cal [place] with questions. Return as needed or in 1 year for next wellness exam.

## 2013-08-09 NOTE — Assessment & Plan Note (Signed)
Improved

## 2013-08-09 NOTE — Assessment & Plan Note (Signed)
Chronic, stable. Continue crestor.  

## 2013-08-09 NOTE — Addendum Note (Signed)
Addended by: Helene Shoe on: 08/09/2013 01:13 PM   Modules accepted: Orders

## 2013-08-09 NOTE — Progress Notes (Signed)
BP 124/84  Pulse 64  Temp(Src) 97.5 F (36.4 C) (Oral)  Ht 5' 8.5" (1.74 m)  Wt 158 lb 12 oz (72.009 kg)  BMI 23.78 kg/m2   CC: medicare wellness visit  Subjective:    Patient ID: Thomas Lopez, male    DOB: 15-Feb-1933, 80 y.o.   MRN: [id number]  HPI: Thomas Lopez is a 80 y.o. male presenting on 08/09/2013 for Annual Exam   1 fall in last year 2 months ago - slipped on ice and hit L shoulder.  No falls since.  Persistent shoulder discomfort/sgiffness.  Right handed. Denies depression/anhedonia.  Hearing screen passed. Vision screen - done recently.  H/o glaucoma, followed by eye doctor. On 2 different drops. Denies problems with vision.   Preventative: H/o prostate cancer - S/p radiation and seed implant, last 2005. Saw Dr. Alinda Money 03/2013, told things were going well. Had DRE done there.  PSA is increasing, last check at urology 03/2013 2.1, today 3.39.  I will fax PSA to Dr. Al[facility] at [facility] H/o colon polyps, last colonoscopy 09/2012 by Dr. [name] - tortuous colon, unable to complete.  Was told would have virtual colonoscopy scheduled but was never contacted.  Advised him to call GI to f/u. Flu shot - 02/2013 Pneumovax 2003.  prevnar today. Td 2008  zostavax 06/2010  Advanced directives - unsure if has living will. Has youngest daughter designated as proxy for health decisions in case he cannot make them.   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  Allergies and medications reviewed and updated. Current Outpatient Prescriptions on File Prior to Visit  Medication Sig  . aspirin EC 81 MG tablet Take 81 mg by mouth daily.  . dorzolamide-timolol (COSOPT) 22.3-6.8 MG/ML ophthalmic solution As directed  . lisinopril-hydrochlorothiazide  (PRINZIDE,ZESTORETIC) 10-12.5 MG per tablet Take 1 tablet by mouth daily.  . methylcellulose packet Take 1 each by mouth as needed.   Marland Kitchen omeprazole (PRILOSEC) 40 MG capsule take 1 capsule by mouth once daily  . rosuvastatin (CRESTOR) 10 MG tablet Take 1 tablet (10 mg total) by mouth daily.  . timolol (TIMOPTIC) 0.25 % ophthalmic solution Place 1 drop into both eyes 2 (two) times daily.     No current facility-administered medications on file prior to visit.    Review of Systems Per HPI unless specifically indicated above    Objective:    BP 124/84  Pulse 64  Temp(Src) 97.5 F (36.4 C) (Oral)  Ht 5' 8.5" (1.74 m)  Wt 158 lb 12 oz (72.009 kg)  BMI 23.78 kg/m2  Physical Exam  Nursing note and vitals reviewed. Constitutional: He is oriented to person, place, and time. He appears well-developed and well-nourished. No distress.  HENT:  Head: Normocephalic and atraumatic.  Right Ear: Hearing, tympanic membrane, external ear and ear canal normal.  Left Ear: Hearing, tympanic membrane, external ear and ear canal normal.  Nose: Nose normal.  Mouth/Throat: Uvula is midline, oropharynx is clear and moist and mucous membranes are normal. No oropharyngeal exudate, posterior oropharyngeal edema or posterior oropharyngeal erythema.  Eyes: Conjunctivae and EOM are normal. Pupils are equal, round, and reactive to light. No scleral icterus.  Neck: Normal range of motion. Neck supple. Carotid bruit is present (slight on right).  Cardiovascular: Normal rate, regular rhythm, normal heart sounds  and intact distal pulses.   No murmur heard. Pulses:      Radial pulses are 2+ on the right side, and 2+ on the left side.  Pulmonary/Chest: Effort normal and breath sounds normal. No respiratory distress. He has no wheezes. He has no rales.  Abdominal: Soft. Bowel sounds are normal. He exhibits no distension and no mass. There is no tenderness. There is no rebound and no guarding.  Genitourinary: Rectum  normal. Rectal exam shows no external hemorrhoid, no internal hemorrhoid, no fissure, no mass, no tenderness and anal tone normal. Prostate is enlarged (~40gm). Prostate is not tender.  Musculoskeletal: Normal range of motion. He exhibits no edema.  Lymphadenopathy:    He has no cervical adenopathy.  Neurological: He is alert and oriented to person, place, and time.  CN grossly intact, station and gait intact  Skin: Skin is warm and dry. No rash noted.  Psychiatric: He has a normal mood and affect. His behavior is normal. Judgment and thought content normal.   Results for orders placed in visit on 08/02/13  TSH      Result Value Ref Range   TSH 1.96  0.35 - 5.50 uIU/mL  PSA      Result Value Ref Range   PSA 3.39  0.10 - 4.00 ng/mL  LIPID PANEL      Result Value Ref Range   Cholesterol 133  0 - 200 mg/dL   Triglycerides 101.0  0.0 - 149.0 mg/dL   HDL 40.30  >39.00 mg/dL   VLDL 20.2  0.0 - 40.0 mg/dL   LDL Cholesterol 73  0 - 99 mg/dL   Total CHOL/HDL Ratio 3    BASIC METABOLIC PANEL      Result Value Ref Range   Sodium 140  135 - 145 mEq/L   Potassium 4.5  3.5 - 5.1 mEq/L   Chloride 107  96 - 112 mEq/L   CO2 28  19 - 32 mEq/L   Glucose, Bld 96  70 - 99 mg/dL   BUN 13  6 - 23 mg/dL   Creatinine, Ser 0.9  0.4 - 1.5 mg/dL   Calcium 9.3  8.4 - 10.5 mg/dL   GFR 111.90  >60.00 mL/min      Assessment & Plan:   Problem List Items Addressed This Visit   ESSENTIAL HYPERTENSION     Chronic, stable. Continue regimen.    History of prostate cancer     S/p seed implant and radiation. PSA trending up but still in normal range. BPH on exam today - anticipate likely related to this and not return of prostate cancer but will fax today's results and OV to Dr. Alinda Money    Hyperglycemia     Improved.    Medicare annual wellness visit, subsequent - Primary     I have personally reviewed the Medicare Annual Wellness questionnaire and have noted 1. The patient's medical and social  history 2. Their use of alcohol, tobacco or illicit drugs 3. Their current medications and supplements 4. The patient's functional ability including ADL's, fall risks, home safety risks and hearing or visual impairment. 5. Diet and physical activity 6. Evidence for depression or mood disorders The patients weight, height, BMI have been recorded in the chart.  Hearing and vision has been addressed. I have made referrals, counseling and provided education to the patient based review of the above and I have provided the pt with a written personalized care plan for preventive services. See scanned questionairre. Advanced directives  discussed: would want daughter to be HCproxy  Reviewed preventative protocols and updated unless pt declined. prevnar today.  Recommended f/u with GI for recent unsuccessful colonoscopy.    Mixed hyperlipidemia     Chronic, stable. Continue crestor    Right carotid bruit     Discussed with patient - will order carotid US.  Current modifiable risk factors well controlled.    Relevant Orders      Carotid duplex   Unspecified glaucoma     Follows regularly with ophtho.    Relevant Medications      travoprost, benzalkonium, (TRAVATAN) 0.004 % ophthalmic solution      LOTEPREDNOL ETABONATE OP       Follow up plan: Return in about 1 year (around 08/10/2014), or as needed, for annual exam, prior fasting for blood work.

## 2013-08-12 ENCOUNTER — Telehealth: Payer: Self-pay | Admitting: Family Medicine

## 2013-08-12 ENCOUNTER — Other Ambulatory Visit: Payer: Self-pay | Admitting: Family Medicine

## 2013-08-12 NOTE — Telephone Encounter (Signed)
Relevant patient education assigned to patient using Emmi. ° °

## 2013-08-30 ENCOUNTER — Encounter (INDEPENDENT_AMBULATORY_CARE_PROVIDER_SITE_OTHER): Payer: Medicare Other

## 2013-08-30 DIAGNOSIS — R0989 Other specified symptoms and signs involving the circulatory and respiratory systems: Secondary | ICD-10-CM

## 2013-08-30 DIAGNOSIS — I6529 Occlusion and stenosis of unspecified carotid artery: Secondary | ICD-10-CM

## 2013-08-31 ENCOUNTER — Encounter: Payer: Self-pay | Admitting: Family Medicine

## 2013-09-02 ENCOUNTER — Encounter: Payer: Self-pay | Admitting: *Deleted

## 2013-09-17 ENCOUNTER — Emergency Department: Payer: Self-pay | Admitting: Emergency Medicine

## 2013-12-06 ENCOUNTER — Other Ambulatory Visit: Payer: Self-pay | Admitting: Family Medicine

## 2013-12-06 NOTE — Telephone Encounter (Signed)
I spoke with patient for verification of medication. He said his bottle said plain lisinopril but med list says lisinopril-hctz. He thought there was mention of a combo pill at a previous appt, so now he doesn't know if he has been taking the wrong med or not. Looking back, it was changed to combo 3/14. I don't know how he's been getting plain lisinopril-but last note said to continue current regimen. Which do you want him to be on?

## 2013-12-07 MED ORDER — LISINOPRIL-HYDROCHLOROTHIAZIDE 10-12.5 MG PO TABS: 1.0000 | ORAL_TABLET | Freq: Every day | ORAL | Status: DC

## 2013-12-07 NOTE — Telephone Encounter (Signed)
plz notify he should be on lisinopril hctz 10/12.5. Monitor bp with this med and notify me if low readings or if low sxs like dizziness or lightheadedness. Ensure stays well hydrated. BP Readings from Last 3 Encounters:  08/09/13 124/84  09/24/12 135/81  08/08/12 158/82   Lab Results  Component Value Date   CREATININE 0.9 08/02/2013

## 2013-12-09 NOTE — Telephone Encounter (Signed)
Message left notifying patient.

## 2014-02-19 ENCOUNTER — Ambulatory Visit (INDEPENDENT_AMBULATORY_CARE_PROVIDER_SITE_OTHER): Payer: 59

## 2014-02-19 DIAGNOSIS — Z23 Encounter for immunization: Secondary | ICD-10-CM

## 2014-04-11 ENCOUNTER — Encounter: Payer: Self-pay | Admitting: Family Medicine

## 2014-07-18 ENCOUNTER — Other Ambulatory Visit: Payer: Self-pay | Admitting: Family Medicine

## 2014-07-28 DIAGNOSIS — H4011X3 Primary open-angle glaucoma, severe stage: Secondary | ICD-10-CM | POA: Diagnosis not present

## 2014-07-28 DIAGNOSIS — H2512 Age-related nuclear cataract, left eye: Secondary | ICD-10-CM | POA: Diagnosis not present

## 2014-08-03 ENCOUNTER — Other Ambulatory Visit: Payer: Self-pay | Admitting: Family Medicine

## 2014-08-03 DIAGNOSIS — Z8546 Personal history of malignant neoplasm of prostate: Secondary | ICD-10-CM

## 2014-08-03 DIAGNOSIS — I10 Essential (primary) hypertension: Secondary | ICD-10-CM

## 2014-08-03 DIAGNOSIS — R739 Hyperglycemia, unspecified: Secondary | ICD-10-CM

## 2014-08-03 DIAGNOSIS — E782 Mixed hyperlipidemia: Secondary | ICD-10-CM

## 2014-08-06 ENCOUNTER — Other Ambulatory Visit (INDEPENDENT_AMBULATORY_CARE_PROVIDER_SITE_OTHER): Payer: Medicare Other

## 2014-08-06 DIAGNOSIS — R739 Hyperglycemia, unspecified: Secondary | ICD-10-CM

## 2014-08-06 DIAGNOSIS — I10 Essential (primary) hypertension: Secondary | ICD-10-CM | POA: Diagnosis not present

## 2014-08-06 DIAGNOSIS — Z8546 Personal history of malignant neoplasm of prostate: Secondary | ICD-10-CM | POA: Diagnosis not present

## 2014-08-06 DIAGNOSIS — E782 Mixed hyperlipidemia: Secondary | ICD-10-CM | POA: Diagnosis not present

## 2014-08-06 LAB — BASIC METABOLIC PANEL
BUN: 15 mg/dL (ref 6–23)
CALCIUM: 9.2 mg/dL (ref 8.4–10.5)
CO2: 30 mEq/L (ref 19–32)
CREATININE: 0.86 mg/dL (ref 0.40–1.50)
Chloride: 105 mEq/L (ref 96–112)
GFR: 110.11 mL/min (ref >60.00)
GLUCOSE: 108 mg/dL — AB (ref 70–99)
Potassium: 4.1 mEq/L (ref 3.5–5.1)
Sodium: 140 mEq/L (ref 135–145)

## 2014-08-06 LAB — CBC WITH DIFFERENTIAL/PLATELET
Basophils Absolute: 0 10*3/uL (ref 0.0–0.1)
Basophils Relative: 0.3 % (ref 0.0–3.0)
Eosinophils Absolute: 0.1 10*3/uL (ref 0.0–0.7)
Eosinophils Relative: 1.4 % (ref 0.0–5.0)
HEMATOCRIT: 41.2 % (ref 39.0–52.0)
Hemoglobin: 13.5 g/dL (ref 13.0–17.0)
LYMPHS ABS: 1 10*3/uL (ref 0.7–4.0)
Lymphocytes Relative: 15.3 % (ref 12.0–46.0)
MCHC: 32.8 g/dL (ref 30.0–36.0)
MCV: 85.3 fl (ref 78.0–100.0)
MONO ABS: 0.4 10*3/uL (ref 0.1–1.0)
Monocytes Relative: 6.8 % (ref 3.0–12.0)
NEUTROS PCT: 76.2 % (ref 43.0–77.0)
Neutro Abs: 5 10*3/uL (ref 1.4–7.7)
Platelets: 199 10*3/uL (ref 150.0–400.0)
RBC: 4.82 Mil/uL (ref 4.22–5.81)
RDW: 14.6 % (ref 11.5–15.5)
WBC: 6.5 10*3/uL (ref 4.0–10.5)

## 2014-08-06 LAB — PSA: PSA: 5.61 ng/mL — AB (ref 0.10–4.00)

## 2014-08-06 LAB — LIPID PANEL
Cholesterol: 165 mg/dL (ref 0–200)
HDL: 39.9 mg/dL (ref >39.00)
LDL CALC: 101 mg/dL — AB (ref 0–99)
NONHDL: 125.1
Total CHOL/HDL Ratio: 4
Triglycerides: 123 mg/dL (ref 0.0–149.0)
VLDL: 24.6 mg/dL (ref 0.0–40.0)

## 2014-08-11 ENCOUNTER — Encounter: Payer: Self-pay | Admitting: Family Medicine

## 2014-08-13 ENCOUNTER — Encounter: Payer: Self-pay | Admitting: Family Medicine

## 2014-08-13 ENCOUNTER — Ambulatory Visit (INDEPENDENT_AMBULATORY_CARE_PROVIDER_SITE_OTHER): Payer: Medicare Other | Admitting: Family Medicine

## 2014-08-13 VITALS — BP 126/76 | HR 60 | Temp 97.7°F | Ht 68.5 in | Wt 166.5 lb

## 2014-08-13 DIAGNOSIS — E782 Mixed hyperlipidemia: Secondary | ICD-10-CM

## 2014-08-13 DIAGNOSIS — Z0000 Encounter for general adult medical examination without abnormal findings: Secondary | ICD-10-CM | POA: Insufficient documentation

## 2014-08-13 DIAGNOSIS — R413 Other amnesia: Secondary | ICD-10-CM | POA: Insufficient documentation

## 2014-08-13 DIAGNOSIS — R739 Hyperglycemia, unspecified: Secondary | ICD-10-CM

## 2014-08-13 DIAGNOSIS — Z86010 Personal history of colonic polyps: Secondary | ICD-10-CM

## 2014-08-13 DIAGNOSIS — I10 Essential (primary) hypertension: Secondary | ICD-10-CM

## 2014-08-13 DIAGNOSIS — Z7189 Other specified counseling: Secondary | ICD-10-CM | POA: Insufficient documentation

## 2014-08-13 DIAGNOSIS — I6523 Occlusion and stenosis of bilateral carotid arteries: Secondary | ICD-10-CM

## 2014-08-13 DIAGNOSIS — Z8546 Personal history of malignant neoplasm of prostate: Secondary | ICD-10-CM

## 2014-08-13 DIAGNOSIS — H409 Unspecified glaucoma: Secondary | ICD-10-CM

## 2014-08-13 NOTE — Assessment & Plan Note (Signed)
Has closer f/u with Dr Alinda Money scheduled. Will fax today's labwork results attn his office.

## 2014-08-13 NOTE — Assessment & Plan Note (Signed)
Stable. Reviewed decreased added sugars in diet.

## 2014-08-13 NOTE — Assessment & Plan Note (Signed)
Follows regularly with ophthalmology

## 2014-08-13 NOTE — Progress Notes (Signed)
BP 126/76 mmHg  Pulse 60  Temp(Src) 97.7 F (36.5 C) (Oral)  Ht 5' 8.5" (1.74 m)  Wt 166 lb 8 oz (75.524 kg)  BMI 24.95 kg/m2   CC: medicare wellness visit  Subjective:    Patient ID: Thomas Lopez, male    DOB: 11/28/1932, 81 y.o.   MRN: [id number]  HPI: Thomas Lopez is a 81 y.o. male presenting on 08/13/2014 for Annual Exam   Hearing screen passed. Vision screen - done recently. H/o glaucoma, followed by eye doctor. On 2 different drops. Denies problems with vision.  No falls in last year Denies depression/anhedonia.   Preventative: H/o prostate cancer s/p radiation and seed implant, last 2005. Saw Dr. Alinda Money 03/2014, PSA increasing, today 5.61. rec Q6 mo surveillance.  H/o colon polyps, last colonoscopy 09/2012 by Dr. [name] - tortuous colon, unable to complete. Was told would have virtual colonoscopy scheduled but was never contacted. Advised him to call GI to f/u last year - never f/u. Flu shot - 01/2014 Pneumovax 2003 (done after age 32). prevnar 2015.  Td 2008  zostavax 06/2010  Advanced directives - unsure if has living will. Has youngest daughter designated as proxy for health decisions in case he cannot make them. Packet provided today.  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 Interim medical history since our last visit reviewed. Allergies and medications reviewed and updated. Current Outpatient Prescriptions on File Prior to Visit  Medication Sig  . aspirin EC 81 MG tablet Take 81 mg by mouth daily.  . CRESTOR 10 MG tablet take 1 tablet by mouth once daily  . dorzolamide-timolol (COSOPT) 22.3-6.8 MG/ML ophthalmic solution As directed  . lisinopril-hydrochlorothiazide (PRINZIDE,ZESTORETIC) 10-12.5 MG per tablet  Take 1 tablet by mouth daily.  . methylcellulose packet Take 1 each by mouth as needed.   Marland Kitchen omeprazole (PRILOSEC) 40 MG capsule take 1 capsule by mouth once daily   No current facility-administered medications on file prior to visit.    Review of Systems  Constitutional: Negative for fever, chills, activity change, appetite change, fatigue and unexpected weight change.  HENT: Negative for hearing loss.   Eyes: Positive for visual disturbance (known h/o glaucoma).  Respiratory: Negative for cough, chest tightness, shortness of breath and wheezing.   Cardiovascular: Negative for chest pain, palpitations and leg swelling.  Gastrointestinal: Negative for nausea, vomiting, abdominal pain, diarrhea, constipation, blood in stool and abdominal distention.  Genitourinary: Negative for hematuria and difficulty urinating.  Musculoskeletal: Negative for myalgias, arthralgias and neck pain.  Skin: Negative for rash.  Neurological: Negative for dizziness, seizures, syncope and headaches.  Hematological: Negative for adenopathy. Does not bruise/bleed easily.  Psychiatric/Behavioral: Negative for dysphoric mood. The patient is not nervous/anxious.    Per HPI unless specifically indicated above     Objective:    BP 126/76 mmHg  Pulse 60  Temp(Src) 97.7 F (36.5 C) (Oral)  Ht 5' 8.5" (1.74 m)  Wt 166 lb 8 oz (75.524 kg)  BMI 24.95 kg/m2  Wt Readings from Last 3 Encounters:  08/13/14 166 lb 8 oz (75.524 kg)  08/09/13 158 lb 12 oz (72.009 kg)  09/24/12 165 lb (74.844 kg)    Physical Exam  Constitutional: He is oriented to person, place, and time. He appears well-developed and well-nourished. No distress.  thin  HENT:  Head: Normocephalic and atraumatic.  Right Ear: Hearing, tympanic membrane, external ear and ear canal normal.  Left Ear: Hearing, tympanic membrane, external ear and ear canal normal.  Nose: Nose normal.  Mouth/Throat: Uvula is midline, oropharynx is clear and moist and  mucous membranes are normal. No oropharyngeal exudate, posterior oropharyngeal edema or posterior oropharyngeal erythema.  Eyes: Conjunctivae and EOM are normal. Pupils are equal, round, and reactive to light. No scleral icterus.  Neck: Normal range of motion. Neck supple. Carotid bruit is not present (none appreciated today). No thyromegaly present.  Cardiovascular: Normal rate, regular rhythm, normal heart sounds and intact distal pulses.   No murmur heard. Pulses:      Radial pulses are 2+ on the right side, and 2+ on the left side.  Pulmonary/Chest: Effort normal and breath sounds normal. No respiratory distress. He has no wheezes. He has no rales.  Abdominal: Soft. Bowel sounds are normal. He exhibits no distension and no mass. There is no tenderness. There is no rebound and no guarding.  Genitourinary:  Deferred - followed by urology  Musculoskeletal: Normal range of motion. He exhibits no edema.  Lymphadenopathy:    He has no cervical adenopathy.  Neurological: He is alert and oriented to person, place, and time.  CN grossly intact, station and gait intact Recall 0/3 Calculation 4/5 serial 3s, trouble with serial 7s  Skin: Skin is warm and dry. No rash noted.  Psychiatric: He has a normal mood and affect. His behavior is normal. Judgment and thought content normal.  Nursing note and vitals reviewed.  Results for orders placed or performed in visit on 08/06/14  PSA  Result Value Ref Range   PSA 5.61 (H) 0.10 - 4.00 ng/mL  Lipid panel  Result Value Ref Range   Cholesterol 165 0 - 200 mg/dL   Triglycerides 123.0 0.0 - 149.0 mg/dL   HDL 39.90 >39.00 mg/dL   VLDL 24.6 0.0 - 40.0 mg/dL   LDL Cholesterol 101 (H) 0 - 99 mg/dL   Total CHOL/HDL Ratio 4    NonHDL 400.86   Basic metabolic panel  Result Value Ref Range   Sodium 140 135 - 145 mEq/L   Potassium 4.1 3.5 - 5.1 mEq/L   Chloride 105 96 - 112 mEq/L   CO2 30 19 - 32 mEq/L   Glucose, Bld 108 (H) 70 - 99 mg/dL   BUN 15 6 -  23 mg/dL   Creatinine, Ser 0.86 0.40 - 1.50 mg/dL   Calcium 9.2 8.4 - 10.5 mg/dL   GFR 110.11 >60.00 mL/min  CBC with Differential/Platelet  Result Value Ref Range   WBC 6.5 4.0 - 10.5 K/uL   RBC 4.82 4.22 - 5.81 Mil/uL   Hemoglobin 13.5 13.0 - 17.0 g/dL   HCT 41.2 39.0 - 52.0 %   MCV 85.3 78.0 - 100.0 fl   MCHC 32.8 30.0 - 36.0 g/dL   RDW 14.6 11.5 - 15.5 %   Platelets 199.0 150.0 - 400.0 K/uL   Neutrophils Relative % 76.2 43.0 - 77.0 %   Lymphocytes Relative 15.3 12.0 - 46.0 %   Monocytes Relative 6.8 3.0 - 12.0 %   Eosinophils Relative 1.4 0.0 - 5.0 %   Basophils Relative 0.3 0.0 - 3.0 %   Neutro Abs 5.0 1.4 - 7.7 K/uL   Lymphs Abs 1.0 0.7 - 4.0 K/uL   Monocytes Absolute 0.4 0.1 - 1.0 K/uL   Eosinophils Absolute 0.1 0.0 - 0.7 K/uL  Basophils Absolute 0.0 0.0 - 0.1 K/uL      Assessment & Plan:   Problem List Items Addressed This Visit    Mixed hyperlipidemia    Chronic, stable. Reviewed #s with patient. Continue crestor.      Memory impairment    On limited testing today - 0/3 recall, but ok calculation of serial 3s. Denies noticing trouble with memory, denies fam mentioning concerns either. Does not get lost while driving, does not forgt items when shopping. Advised monitor memory and have family monitor as well, rec keeping memory active with word puzzles and reading. To return if worsening memory noted for formal memory testing, otherwise may reassess in 1 year.      Medicare annual wellness visit, subsequent - Primary    I have personally reviewed the Medicare Annual Wellness questionnaire and have noted 1. The patient's medical and social history 2. Their use of alcohol, tobacco or illicit drugs 3. Their current medications and supplements 4. The patient's functional ability including ADL's, fall risks, home safety risks and hearing or visual impairment. 5. Diet and physical activity 6. Evidence for depression or mood disorders The patients weight, height, BMI  have been recorded in the chart.  Hearing and vision has been addressed. I have made referrals, counseling and provided education to the patient based review of the above and I have provided the pt with a written personalized care plan for preventive services. Provider list updated - see scanned questionairre. Reviewed preventative protocols and updated unless pt declined.       Hyperglycemia    Stable. Reviewed decreased added sugars in diet.      History of prostate cancer    Has closer f/u with Dr Alinda Money scheduled. Will fax today's labwork results attn his office.      History of colonic polyps    Again advised to f/u with Dr Eugenia Pancoast office re possible virtual colonoscopy.      Health maintenance examination    Preventative protocols reviewed and updated unless pt declined. Discussed healthy diet and lifestyle.       Glaucoma    Follows regularly with ophthalmology      Relevant Medications   latanoprost (XALATAN) 0.005 % ophthalmic solution   brimonidine (ALPHAGAN P) 0.1 % SOLN   Essential hypertension    Chronic, stable. Continue regimen.      Carotid stenosis    Rpt 08/2015      Advanced care planning/counseling discussion    Advanced directives - unsure if has living will. Has youngest daughter designated as proxy for health decisions in case he cannot make them. Packet provided today.          Follow up plan: Return in about 1 year (around 08/13/2015), or as needed, for medicare wellness visit.

## 2014-08-13 NOTE — Assessment & Plan Note (Signed)
Preventative protocols reviewed and updated unless pt declined. Discussed healthy diet and lifestyle.  

## 2014-08-13 NOTE — Assessment & Plan Note (Signed)
Rpt 08/2015

## 2014-08-13 NOTE — Assessment & Plan Note (Signed)
Advanced directives - unsure if has living will. Has youngest daughter designated as proxy for health decisions in case he cannot make them. Packet provided today.

## 2014-08-13 NOTE — Assessment & Plan Note (Signed)

## 2014-08-13 NOTE — Progress Notes (Signed)
Pre visit review using our clinic review tool, if applicable. No additional management support is needed unless otherwise documented below in the visit note. 

## 2014-08-13 NOTE — Assessment & Plan Note (Signed)
Chronic, stable. Reviewed #s with patient. Continue crestor.

## 2014-08-13 NOTE — Assessment & Plan Note (Signed)
Chronic, stable. Continue regimen. 

## 2014-08-13 NOTE — Assessment & Plan Note (Signed)
On limited testing today - 0/3 recall, but ok calculation of serial 3s. Denies noticing trouble with memory, denies fam mentioning concerns either. Does not get lost while driving, does not forgt items when shopping. Advised monitor memory and have family monitor as well, rec keeping memory active with word puzzles and reading. To return if worsening memory noted for formal memory testing, otherwise may reassess in 1 year.

## 2014-08-13 NOTE — Assessment & Plan Note (Signed)
Again advised to f/u with Dr Eugenia Pancoast office re possible virtual colonoscopy.

## 2014-08-13 NOTE — Patient Instructions (Addendum)
Contact Dr [name]' office regarding follow up for colonoscopy. Advanced planing packet provided today.  Let's keep an eye on memory - work on keeping memory active with jigsaw puzzles, word puzzles, and reading. If you notice more trouble with memory, return for formal memory testing. Return as needed or in 1 year for next medicare wellness visit.

## 2014-09-08 DIAGNOSIS — H4011X3 Primary open-angle glaucoma, severe stage: Secondary | ICD-10-CM | POA: Diagnosis not present

## 2014-09-08 DIAGNOSIS — H2512 Age-related nuclear cataract, left eye: Secondary | ICD-10-CM | POA: Diagnosis not present

## 2014-11-06 ENCOUNTER — Other Ambulatory Visit: Payer: Self-pay | Admitting: Family Medicine

## 2014-11-18 DIAGNOSIS — H4011X3 Primary open-angle glaucoma, severe stage: Secondary | ICD-10-CM | POA: Diagnosis not present

## 2014-11-19 ENCOUNTER — Other Ambulatory Visit: Payer: Self-pay | Admitting: Family Medicine

## 2014-11-19 DIAGNOSIS — I6523 Occlusion and stenosis of bilateral carotid arteries: Secondary | ICD-10-CM

## 2014-11-21 DIAGNOSIS — I6529 Occlusion and stenosis of unspecified carotid artery: Secondary | ICD-10-CM

## 2014-11-21 HISTORY — DX: Occlusion and stenosis of unspecified carotid artery: I65.29

## 2014-11-28 ENCOUNTER — Ambulatory Visit (INDEPENDENT_AMBULATORY_CARE_PROVIDER_SITE_OTHER): Payer: Medicare Other

## 2014-11-28 DIAGNOSIS — I6523 Occlusion and stenosis of bilateral carotid arteries: Secondary | ICD-10-CM | POA: Diagnosis not present

## 2014-12-02 ENCOUNTER — Encounter: Payer: Self-pay | Admitting: *Deleted

## 2014-12-02 ENCOUNTER — Encounter: Payer: Self-pay | Admitting: Family Medicine

## 2014-12-04 DIAGNOSIS — C61 Malignant neoplasm of prostate: Secondary | ICD-10-CM | POA: Diagnosis not present

## 2014-12-08 DIAGNOSIS — H2512 Age-related nuclear cataract, left eye: Secondary | ICD-10-CM | POA: Diagnosis not present

## 2014-12-08 DIAGNOSIS — H4011X3 Primary open-angle glaucoma, severe stage: Secondary | ICD-10-CM | POA: Diagnosis not present

## 2015-01-28 DIAGNOSIS — H2512 Age-related nuclear cataract, left eye: Secondary | ICD-10-CM | POA: Diagnosis not present

## 2015-01-28 DIAGNOSIS — H2510 Age-related nuclear cataract, unspecified eye: Secondary | ICD-10-CM | POA: Insufficient documentation

## 2015-02-25 ENCOUNTER — Ambulatory Visit (INDEPENDENT_AMBULATORY_CARE_PROVIDER_SITE_OTHER): Payer: Medicare Other

## 2015-02-25 DIAGNOSIS — Z23 Encounter for immunization: Secondary | ICD-10-CM

## 2015-03-06 ENCOUNTER — Ambulatory Visit (INDEPENDENT_AMBULATORY_CARE_PROVIDER_SITE_OTHER): Payer: Medicare Other | Admitting: Family Medicine

## 2015-03-06 ENCOUNTER — Encounter: Payer: Self-pay | Admitting: Family Medicine

## 2015-03-06 VITALS — BP 136/70 | HR 62 | Temp 97.5°F | Wt 170.0 lb

## 2015-03-06 DIAGNOSIS — C61 Malignant neoplasm of prostate: Secondary | ICD-10-CM | POA: Diagnosis not present

## 2015-03-06 NOTE — Progress Notes (Signed)
Pre visit review using our clinic review tool, if applicable. No additional management support is needed unless otherwise documented below in the visit note. 

## 2015-03-06 NOTE — Assessment & Plan Note (Addendum)
Discussed prostate cancer recurrence and treatment plan. Very satisfied with [facility] care, but desires closer f/u (he lives in [place]). [place] vs [facility]. Pt desires to transition care to [facility] urology. Referral placed today.  Will route to Dr Alinda Money as Juluis Rainier.

## 2015-03-06 NOTE — Patient Instructions (Signed)
We will refer you to urologist in [place]. Pass by our referral coordinator for scheduling appointment in next few months.  Nice to see you today, call us with questions.

## 2015-03-06 NOTE — Progress Notes (Signed)
   BP 136/70 mmHg  Pulse 62  Temp(Src) 97.5 F (36.4 C) (Oral)  Wt 170 lb (77.111 kg)  SpO2 97%   CC: discuss urology  Subjective:    Patient ID: Thomas Lopez, male    DOB: [birth year], 81 y.o.   MRN: [id number]  HPI: Thomas Lopez is a 81 y.o. male presenting on 03/06/2015 for Follow-up   H/o prostate cancer s/p ERBT and radiation seed implant, completed 2005. Saw Dr. Alinda Money 03/2014, PSA increasing. Lab Results  Component Value Date   PSA 5.61* 08/06/2014   PSA 3.39 08/02/2013   PSA 1.54 08/01/2012   SSaw Dr Alinda Money again 11/2014 - discussing androgen deprivation therapy pending eval of doubling time.   He has seen [facility] for last 20 yrs. He now lives in [place]. He is considering switching to more local urologist.   Relevant past medical, surgical, family and social history reviewed and updated as indicated. Interim medical history since our last visit reviewed. Allergies and medications reviewed and updated. Current Outpatient Prescriptions on File Prior to Visit  Medication Sig  . aspirin EC 81 MG tablet Take 81 mg by mouth daily.  . brimonidine (ALPHAGAN P) 0.1 % SOLN Apply 1 drop to eye at bedtime.  . CRESTOR 10 MG tablet take 1 tablet by mouth once daily  . dorzolamide-timolol (COSOPT) 22.3-6.8 MG/ML ophthalmic solution As directed  . latanoprost (XALATAN) 0.005 % ophthalmic solution Place 1 drop into both eyes at bedtime.  Marland Kitchen lisinopril-hydrochlorothiazide (PRINZIDE,ZESTORETIC) 10-12.5 MG per tablet Take 1 tablet by mouth daily.  . methylcellulose packet Take 1 each by mouth as needed.   Marland Kitchen omeprazole (PRILOSEC) 40 MG capsule take 1 capsule by mouth once daily   No current facility-administered medications on file prior to visit.    Review of Systems Per HPI unless specifically indicated above     Objective:    BP 136/70 mmHg  Pulse 62  Temp(Src) 97.5 F (36.4 C) (Oral)  Wt 170 lb (77.111 kg)  SpO2 97%  Wt Readings from Last 3 Encounters:    03/06/15 170 lb (77.111 kg)  08/13/14 166 lb 8 oz (75.524 kg)  08/09/13 158 lb 12 oz (72.009 kg)    Physical Exam  Constitutional: He appears well-developed and well-nourished. No distress.  Nursing note and vitals reviewed.     Assessment & Plan:   Problem List Items Addressed This Visit    Primary adenocarcinoma of prostate ([place]) - Primary    Discussed prostate cancer recurrence and treatment plan. Very satisfied with [facility] care, but desires closer f/u (he lives in [place]). [place] vs [place] [facility]. Pt desires to transition care to [facility] urology. Referral placed today.  Will route to Dr Alinda Money as Juluis Rainier.      Relevant Orders   Ambulatory referral to Urology       Follow up plan: No Follow-up on file.

## 2015-03-27 ENCOUNTER — Ambulatory Visit (INDEPENDENT_AMBULATORY_CARE_PROVIDER_SITE_OTHER): Payer: Medicare Other | Admitting: Urology

## 2015-03-27 ENCOUNTER — Encounter: Payer: Self-pay | Admitting: Urology

## 2015-03-27 VITALS — BP 161/71 | HR 71 | Ht 70.0 in | Wt 169.6 lb

## 2015-03-27 DIAGNOSIS — C61 Malignant neoplasm of prostate: Secondary | ICD-10-CM

## 2015-03-27 NOTE — Progress Notes (Signed)
03/27/2015 9:07 AM   Thomas Lopez 1932-09-05 [id number]  Referring provider: [name], MD [address] [place], [place] [address]  Chief Complaint  Patient presents with  . Prostate Cancer    New Patient    HPI: The patient is an 81 year old gentleman with a history of prostate cancer presents to transfer his care from Dr. Alinda Money in [place]. He is status post treatment with external beam radiation therapy and radiation seed implant in 2500 Dr. Joelyn Oms and Dr. Danny Lawless for T1c Gleason 3+4 = 7 prostate cancer with a pretreatment PSA of 6.8. In November 2015, he was found to have biochemical recurrence.  PSA nadir after treatment: 0.42) face 2010)  November 2015: Biochemical recurrence  At this time he is asymptomatic. He denies any voiding symptoms. He denies weight loss or bone pain. He has no hematuria.  He also on the past was on Trimix and use a vacuum erection device. He is not interested in those medications at this time.   PMH: Past Medical History  Diagnosis Date  . HTN (hypertension)   . HLD (hyperlipidemia)   . Hyperglycemia   . Esophagitis   . ED (erectile dysfunction)     Trimix and VED  . IBS (irritable bowel syndrome)   . Colon polyps     rpt colonoscopy due 2014  . Glaucoma   . History of prostate cancer 2005    s/p seed implant, EBRT, PSA increased concern for recurrence (Dr. Alinda Money at [facility])  . Smoking history   . GERD (gastroesophageal reflux disease)   . Carotid stenosis 11/2014    mild bilateral 1-39%, rpt 2 yrs    Surgical History: Past Surgical History  Procedure Laterality Date  . Wrist release  6/98; 9/99    Right  . Ncs/wrists left carpal  2/02    Min./right improved  . Esophagogastroduodenoscopy  03/01/01    H.H.; gastritis esoph. dudodenitis  . Prostate biopsy  12/04    positive; radioactive seed implant (Dr. Joelyn Oms)  . Colonoscopy  03/01/01    Multiple polyps, divertic//adenomatous polyps  . Colonoscopy   04/24/01    multiple polyps  . Colonoscopy  10/23/01    Polyps benign-repeat every 2 years  . Colonoscopy  06/23/04    Adenom/hyperplastic polyps; multiple external hemorrhoids  . Colonoscopy  06/08/07    single small polyp-repeat 5 years  . Prostate ext beam radiation  1/27-07/23/03    Dr. Danny Lawless  . Spirometry  09/2011    WNL  . Pfts  10/2012    FVC 64%, FEV1 41%, ratio 0.62    Home Medications:    Medication List       This list is accurate as of: 03/27/15  9:07 AM.  Always use your most recent med list.               aspirin EC 81 MG tablet  Take 81 mg by mouth daily.     brimonidine 0.1 % Soln  Commonly known as:  ALPHAGAN P  Apply 1 drop to eye at bedtime.     CRESTOR 10 MG tablet  Generic drug:  rosuvastatin  take 1 tablet by mouth once daily     dorzolamide-timolol 22.3-6.8 MG/ML ophthalmic solution  Commonly known as:  COSOPT  As directed     latanoprost 0.005 % ophthalmic solution  Commonly known as:  XALATAN  Place 1 drop into both eyes at bedtime.     lisinopril-hydrochlorothiazide 10-12.5 MG tablet  Commonly  known as:  PRINZIDE,ZESTORETIC  Take 1 tablet by mouth daily.     methylcellulose packet  Take 1 each by mouth as needed.     omeprazole 40 MG capsule  Commonly known as:  PRILOSEC  take 1 capsule by mouth once daily        Allergies: No Known Allergies  Family History: Family History  Problem Relation Age of Onset  . Cancer Mother     colon  . Hyperlipidemia Mother   . Other Mother     Carotid disease  . Coronary artery disease Neg Hx   . Stroke Neg Hx     Social History:  reports that he quit smoking about 21 years ago. He has never used smokeless tobacco. He reports that he drinks alcohol. He reports that he does not use illicit drugs.  ROS: UROLOGY Frequent Urination?: No Hard to postpone urination?: Yes Burning/pain with urination?: No Get up at night to urinate?: Yes Leakage of urine?: No Urine stream starts and stops?:  No Trouble starting stream?: No Do you have to strain to urinate?: No Blood in urine?: No Urinary tract infection?: No Sexually transmitted disease?: No Injury to kidneys or bladder?: No Painful intercourse?: No Weak stream?: No Erection problems?: Yes Penile pain?: No  Gastrointestinal Nausea?: No Vomiting?: No Indigestion/heartburn?: No Diarrhea?: No Constipation?: No  Constitutional Fever: No Night sweats?: No Weight loss?: No Fatigue?: No  Skin Skin rash/lesions?: No Itching?: No  Eyes Blurred vision?: No Double vision?: No  Ears/Nose/Throat Sore throat?: No Sinus problems?: No  Hematologic/Lymphatic Swollen glands?: No Easy bruising?: No  Cardiovascular Leg swelling?: No Chest pain?: No  Respiratory Cough?: No Shortness of breath?: No  Endocrine Excessive thirst?: No  Musculoskeletal Back pain?: No Joint pain?: No  Neurological Headaches?: No Dizziness?: No  Psychologic Depression?: No Anxiety?: No  Physical Exam: BP 161/71 mmHg  Pulse 71  Ht 5\' 10"  (1.778 m)  Wt 169 lb 9.6 oz (76.93 kg)  BMI 24.34 kg/m2  Constitutional:  Alert and oriented, No acute distress. HEENT: [place] AT, moist mucus membranes.  Trachea midline, no masses. Cardiovascular: No clubbing, cyanosis, or edema. Respiratory: Normal respiratory effort, no increased work of breathing. GI: Abdomen is soft, nontender, nondistended, no abdominal masses GU: No CVA tenderness. Normal phallus. Testicles descended equally bilaterally. No masses. DRE: Prostate is smooth and flat with no nodules. Skin: No rashes, bruises or suspicious lesions. Lymph: No cervical or inguinal adenopathy. Neurologic: Grossly intact, no focal deficits, moving all 4 extremities. Psychiatric: Normal mood and affect.  Laboratory Data: Lab Results  Component Value Date   WBC 6.5 08/06/2014   HGB 13.5 08/06/2014   HCT 41.2 08/06/2014   MCV 85.3 08/06/2014   PLT 199.0 08/06/2014    Lab Results    Component Value Date   CREATININE 0.86 08/06/2014    Lab Results  Component Value Date   PSA 5.61* 08/06/2014   PSA 3.39 08/02/2013   PSA 1.54 08/01/2012    No results found for: TESTOSTERONE  Lab Results  Component Value Date   HGBA1C 5.6 04/19/2005    Urinalysis No results found for: COLORURINE, APPEARANCEUR, LABSPEC, [place], GLUCOSEU, HGBUR, BILIRUBINUR, KETONESUR, PROTEINUR, UROBILINOGEN, NITRITE, LEUKOCYTESUR   PSA: 12/04/2014: 9.58 04/09/2014: 5.26 04/03/2013: 2.1 04/04/2012: 1.35 10/04/2011: 1.15 02/08/2008: 0.49  PSA doubling time: 9.2 months  Assessment & Plan:    1. Prostate cancer [facility]) The patient is asymptomatic biochemical recurrence. His PSA doubling time is 9.2 months. We will recheck his PSA today. We  will also get him set up for a CT and bone scan to ensure there is no evidence of metastasis at this time. He'll follow-up in one month's time to review the imaging.   - PSA - Basic metabolic panel - CT Abdomen Pelvis W Contrast; Future - NM Bone Scan Whole Body; Future   Return in about 4 weeks (around 04/24/2015).  Nickie Retort, MD  [facility] [address] [place], [place] [address] [phone]

## 2015-03-28 LAB — PSA: Prostate Specific Ag, Serum: 11.1 ng/mL — ABNORMAL HIGH (ref 0.0–4.0)

## 2015-03-31 ENCOUNTER — Telehealth: Payer: Self-pay

## 2015-03-31 NOTE — Telephone Encounter (Signed)
Please let patient know that his PSA increased from 5.61 to 11.1. He will need get his imaging studies as ordered and follow up as scheduled.--message from Dr. Pilar Jarvis  [facility]

## 2015-03-31 NOTE — Telephone Encounter (Signed)
Patient has been advised and expressed his understanding.

## 2015-04-09 ENCOUNTER — Other Ambulatory Visit: Payer: Self-pay | Admitting: Family Medicine

## 2015-04-14 DIAGNOSIS — H2512 Age-related nuclear cataract, left eye: Secondary | ICD-10-CM | POA: Diagnosis not present

## 2015-04-14 DIAGNOSIS — Z01810 Encounter for preprocedural cardiovascular examination: Secondary | ICD-10-CM | POA: Diagnosis not present

## 2015-04-14 DIAGNOSIS — Z79899 Other long term (current) drug therapy: Secondary | ICD-10-CM | POA: Diagnosis not present

## 2015-04-14 DIAGNOSIS — I10 Essential (primary) hypertension: Secondary | ICD-10-CM | POA: Diagnosis not present

## 2015-04-14 DIAGNOSIS — R001 Bradycardia, unspecified: Secondary | ICD-10-CM | POA: Diagnosis not present

## 2015-04-14 DIAGNOSIS — H409 Unspecified glaucoma: Secondary | ICD-10-CM | POA: Diagnosis not present

## 2015-04-14 DIAGNOSIS — Z8546 Personal history of malignant neoplasm of prostate: Secondary | ICD-10-CM

## 2015-04-14 DIAGNOSIS — K219 Gastro-esophageal reflux disease without esophagitis: Secondary | ICD-10-CM | POA: Diagnosis not present

## 2015-04-14 DIAGNOSIS — H401123 Primary open-angle glaucoma, left eye, severe stage: Secondary | ICD-10-CM | POA: Diagnosis not present

## 2015-04-14 DIAGNOSIS — E785 Hyperlipidemia, unspecified: Secondary | ICD-10-CM | POA: Diagnosis not present

## 2015-04-14 DIAGNOSIS — Z8669 Personal history of other diseases of the nervous system and sense organs: Secondary | ICD-10-CM | POA: Diagnosis not present

## 2015-04-14 DIAGNOSIS — Z87891 Personal history of nicotine dependence: Secondary | ICD-10-CM | POA: Diagnosis not present

## 2015-04-14 DIAGNOSIS — Z7982 Long term (current) use of aspirin: Secondary | ICD-10-CM | POA: Diagnosis not present

## 2015-04-21 DIAGNOSIS — E785 Hyperlipidemia, unspecified: Secondary | ICD-10-CM | POA: Diagnosis not present

## 2015-04-21 DIAGNOSIS — Z79899 Other long term (current) drug therapy: Secondary | ICD-10-CM | POA: Diagnosis not present

## 2015-04-21 DIAGNOSIS — I10 Essential (primary) hypertension: Secondary | ICD-10-CM | POA: Diagnosis not present

## 2015-04-21 DIAGNOSIS — Z87891 Personal history of nicotine dependence: Secondary | ICD-10-CM | POA: Diagnosis not present

## 2015-04-21 DIAGNOSIS — Z8546 Personal history of malignant neoplasm of prostate: Secondary | ICD-10-CM | POA: Diagnosis not present

## 2015-04-21 DIAGNOSIS — H2512 Age-related nuclear cataract, left eye: Secondary | ICD-10-CM | POA: Diagnosis not present

## 2015-04-21 DIAGNOSIS — H401123 Primary open-angle glaucoma, left eye, severe stage: Secondary | ICD-10-CM | POA: Diagnosis not present

## 2015-04-21 DIAGNOSIS — K219 Gastro-esophageal reflux disease without esophagitis: Secondary | ICD-10-CM | POA: Diagnosis not present

## 2015-04-22 DIAGNOSIS — H401133 Primary open-angle glaucoma, bilateral, severe stage: Secondary | ICD-10-CM | POA: Diagnosis not present

## 2015-04-23 DIAGNOSIS — I708 Atherosclerosis of other arteries: Secondary | ICD-10-CM

## 2015-04-23 DIAGNOSIS — I700 Atherosclerosis of aorta: Secondary | ICD-10-CM

## 2015-04-23 HISTORY — PX: CATARACT EXTRACTION, BILATERAL: SHX1313

## 2015-04-23 HISTORY — DX: Atherosclerosis of other arteries: I70.8

## 2015-04-23 HISTORY — DX: Atherosclerosis of aorta: I70.0

## 2015-04-24 ENCOUNTER — Encounter
Admission: RE | Admit: 2015-04-24 | Discharge: 2015-04-24 | Disposition: A | Discharge status: Home / Self Care | Payer: Medicare Other | Source: Ambulatory Visit | Attending: Urology | Admitting: Urology

## 2015-04-24 ENCOUNTER — Ambulatory Visit
Admission: RE | Admit: 2015-04-24 | Discharge: 2015-04-24 | Disposition: A | Discharge status: Home / Self Care | Payer: Medicare Other | Source: Ambulatory Visit | Attending: Urology | Admitting: Urology

## 2015-04-24 DIAGNOSIS — M47816 Spondylosis without myelopathy or radiculopathy, lumbar region: Secondary | ICD-10-CM | POA: Insufficient documentation

## 2015-04-24 DIAGNOSIS — I2510 Atherosclerotic heart disease of native coronary artery without angina pectoris: Secondary | ICD-10-CM | POA: Insufficient documentation

## 2015-04-24 DIAGNOSIS — K5730 Diverticulosis of large intestine without perforation or abscess without bleeding: Secondary | ICD-10-CM | POA: Diagnosis not present

## 2015-04-24 DIAGNOSIS — N400 Enlarged prostate without lower urinary tract symptoms: Secondary | ICD-10-CM | POA: Diagnosis not present

## 2015-04-24 DIAGNOSIS — M47814 Spondylosis without myelopathy or radiculopathy, thoracic region: Secondary | ICD-10-CM | POA: Diagnosis not present

## 2015-04-24 DIAGNOSIS — C61 Malignant neoplasm of prostate: Secondary | ICD-10-CM | POA: Diagnosis not present

## 2015-04-24 DIAGNOSIS — R972 Elevated prostate specific antigen [PSA]: Secondary | ICD-10-CM | POA: Diagnosis not present

## 2015-04-24 LAB — POCT I-STAT CREATININE: CREATININE: 1 mg/dL (ref 0.61–1.24)

## 2015-04-24 MED ORDER — TECHNETIUM TC 99M MEDRONATE IV KIT
25.0000 | PACK | Freq: Once | INTRAVENOUS | Status: AC | PRN
  Administered 2015-04-24: 22.37 via INTRAVENOUS

## 2015-04-24 MED ORDER — IOHEXOL 300 MG/ML  SOLN
100.0000 mL | Freq: Once | INTRAMUSCULAR | Status: AC | PRN
  Administered 2015-04-24: 100 mL via INTRAVENOUS

## 2015-05-01 ENCOUNTER — Encounter: Payer: Self-pay | Admitting: Urology

## 2015-05-01 ENCOUNTER — Ambulatory Visit (INDEPENDENT_AMBULATORY_CARE_PROVIDER_SITE_OTHER): Payer: Medicare Other | Admitting: Urology

## 2015-05-01 ENCOUNTER — Other Ambulatory Visit: Payer: Self-pay

## 2015-05-01 VITALS — BP 129/73 | HR 56 | Ht 70.0 in | Wt 169.4 lb

## 2015-05-01 DIAGNOSIS — C61 Malignant neoplasm of prostate: Secondary | ICD-10-CM

## 2015-05-01 NOTE — Progress Notes (Signed)
05/01/2015 10:34 AM   Thomas Lopez Jun 20, 1932 [id number]  Referring provider: [name], MD [address] [place], [place] [address]  Chief Complaint  Patient presents with  . Prostate Cancer    follow up diagnostic study     HPI: The patient is an 81 year old gentleman with a history of prostate cancer presents to transfer his care from Dr. Alinda Money in [place]. He is status post treatment with external beam radiation therapy and radiation seed implant in 2005 with Dr. Joelyn Oms and Dr. Danny Lawless for T1c Gleason 3+4 = 7 prostate cancer with a pretreatment PSA of 6.8. In November 2015, he was found to have biochemical recurrence.  PSA nadir after treatment: 0.42 (2010)  November 2015: Biochemical recurrence  At this time he is asymptomatic. He denies any voiding symptoms. He denies weight loss or bone pain. He has no hematuria.  He also on the past was on Trimix and use a vacuum erection device. He is not interested in those medications at this time.   03/27/2015: 11.1 12/04/2014: 9.58 04/09/2014: 5.26 04/03/2013: 2.1 04/04/2012: 1.35 10/04/2011: 1.15 02/08/2008: 0.49  PSA doubling time: 10.4 months  Interval History: The patient follows up after undergoing a bone scan and CT scan for a PSA rise to 11.24 March 2015. Both of these were negative. He has done well since his last visit.  PMH: Past Medical History  Diagnosis Date  . HTN (hypertension)   . HLD (hyperlipidemia)   . Hyperglycemia   . Esophagitis   . ED (erectile dysfunction)     Trimix and VED  . IBS (irritable bowel syndrome)   . Colon polyps     rpt colonoscopy due 2014  . Glaucoma   . History of prostate cancer 2005    s/p seed implant, EBRT, PSA increased concern for recurrence (Dr. Alinda Money at [facility])  . Smoking history   . GERD (gastroesophageal reflux disease)   . Carotid stenosis 11/2014    mild bilateral 1-39%, rpt 2 yrs  . Cancer ([place])   . Essential hypertension 06/29/2009     Surgical History: Past Surgical History  Procedure Laterality Date  . Wrist release  6/98; 9/99    Right  . Ncs/wrists left carpal  2/02    Min./right improved  . Esophagogastroduodenoscopy  03/01/01    H.H.; gastritis esoph. dudodenitis  . Prostate biopsy  12/04    positive; radioactive seed implant (Dr. Joelyn Oms)  . Colonoscopy  03/01/01    Multiple polyps, divertic//adenomatous polyps  . Colonoscopy  04/24/01    multiple polyps  . Colonoscopy  10/23/01    Polyps benign-repeat every 2 years  . Colonoscopy  06/23/04    Adenom/hyperplastic polyps; multiple external hemorrhoids  . Colonoscopy  06/08/07    single small polyp-repeat 5 years  . Prostate ext beam radiation  1/27-07/23/03    Dr. Danny Lawless  . Spirometry  09/2011    WNL  . Pfts  10/2012    FVC 64%, FEV1 41%, ratio 0.62    Home Medications:    Medication List       This list is accurate as of: 05/01/15 10:34 AM.  Always use your most recent med list.               aspirin EC 81 MG tablet  Take 81 mg by mouth daily.     brimonidine 0.1 % Soln  Commonly known as:  ALPHAGAN P  Apply 1 drop to eye at bedtime.  CRESTOR 10 MG tablet  Generic drug:  rosuvastatin  take 1 tablet by mouth once daily     dorzolamide-timolol 22.3-6.8 MG/ML ophthalmic solution  Commonly known as:  COSOPT  As directed     latanoprost 0.005 % ophthalmic solution  Commonly known as:  XALATAN  Place 1 drop into both eyes at bedtime.     lisinopril-hydrochlorothiazide 10-12.5 MG tablet  Commonly known as:  PRINZIDE,ZESTORETIC  take 1 tablet by mouth once daily     methylcellulose packet  Take 1 each by mouth as needed.     moxifloxacin 0.5 % ophthalmic solution  Commonly known as:  VIGAMOX  1 drop.     omeprazole 40 MG capsule  Commonly known as:  PRILOSEC  take 1 capsule by mouth once daily     prednisoLONE acetate 1 % ophthalmic suspension  Commonly known as:  PRED FORTE  1 drop.        Allergies: No Known  Allergies  Family History: Family History  Problem Relation Age of Onset  . Cancer Mother     colon  . Hyperlipidemia Mother   . Other Mother     Carotid disease  . Coronary artery disease Neg Hx   . Stroke Neg Hx     Social History:  reports that he quit smoking about 21 years ago. He has never used smokeless tobacco. He reports that he drinks alcohol. He reports that he does not use illicit drugs.  ROS: UROLOGY Frequent Urination?: No Hard to postpone urination?: No Burning/pain with urination?: No Get up at night to urinate?: Yes Leakage of urine?: No Urine stream starts and stops?: No Trouble starting stream?: No Do you have to strain to urinate?: No Blood in urine?: No Urinary tract infection?: No Sexually transmitted disease?: No Injury to kidneys or bladder?: No Painful intercourse?: No Weak stream?: No Erection problems?: No Penile pain?: No  Gastrointestinal Nausea?: No Vomiting?: No Indigestion/heartburn?: No Diarrhea?: No Constipation?: No  Constitutional Night sweats?: No Weight loss?: No Fatigue?: No  Skin Skin rash/lesions?: No Itching?: No  Eyes Blurred vision?: No Double vision?: No  Ears/Nose/Throat Sore throat?: No Sinus problems?: No  Hematologic/Lymphatic Swollen glands?: No Easy bruising?: No  Cardiovascular Leg swelling?: No Chest pain?: No  Respiratory Cough?: No Shortness of breath?: No  Endocrine Excessive thirst?: No  Musculoskeletal Back pain?: No Joint pain?: No  Neurological Headaches?: No Dizziness?: No  Psychologic Depression?: No Anxiety?: No  Physical Exam: BP 129/73 mmHg  Pulse 56  Ht 5\' 10"  (1.778 m)  Wt 169 lb 6.4 oz (76.839 kg)  BMI 24.31 kg/m2  Constitutional:  Alert and oriented, No acute distress. HEENT: [place] AT, moist mucus membranes.  Trachea midline, no masses. Cardiovascular: No clubbing, cyanosis, or edema. Respiratory: Normal respiratory effort, no increased work of  breathing. GI: Abdomen is soft, nontender, nondistended, no abdominal masses GU: No CVA tenderness.  Skin: No rashes, bruises or suspicious lesions. Lymph: No cervical or inguinal adenopathy. Neurologic: Grossly intact, no focal deficits, moving all 4 extremities. Psychiatric: Normal mood and affect.  Laboratory Data: Lab Results  Component Value Date   WBC 6.5 08/06/2014   HGB 13.5 08/06/2014   HCT 41.2 08/06/2014   MCV 85.3 08/06/2014   PLT 199.0 08/06/2014    Lab Results  Component Value Date   CREATININE 1.00 04/24/2015    Lab Results  Component Value Date   PSA 11.1* 03/27/2015   PSA 5.61* 08/06/2014   PSA 3.39 08/02/2013  No results found for: TESTOSTERONE  Lab Results  Component Value Date   HGBA1C 5.6 04/19/2005    Urinalysis No results found for: COLORURINE, APPEARANCEUR, LABSPEC, PHURINE, GLUCOSEU, HGBUR, BILIRUBINUR, KETONESUR, PROTEINUR, UROBILINOGEN, NITRITE, LEUKOCYTESUR    Imaging:  CLINICAL DATA: Prostate cancer, previous radiotherapy, recent elevation PSA level  EXAM: CT ABDOMEN AND PELVIS WITH CONTRAST  TECHNIQUE: Multidetector CT imaging of the abdomen and pelvis was performed using the standard protocol following bolus administration of intravenous contrast.  CONTRAST: 152mL OMNIPAQUE IOHEXOL 300 MG/ML SOLN  COMPARISON: None.  FINDINGS: Sagittal images of the spine shows degenerative changes lower thoracic and lumbar spine. There is Schmorl's node deformity lower endplate of L3 vertebral body. No destructive bony lesions are noted. The lung bases are unremarkable.  Enhanced liver shows no biliary ductal dilatation. No calcified gallstones are noted within gallbladder. Extensive atherosclerotic calcifications and plaques are noted within abdominal aorta. Atherosclerotic plaques and calcifications are noted SMA. Atherosclerotic calcifications are noted bilateral renal artery and celiac trunk origin. Atherosclerotic  calcifications and plaques bilateral common iliac and external iliac arteries.  Caps degenerative changes bilateral SI joints.  The pancreas, spleen and adrenal glands are unremarkable. Kidneys are symmetrical in size and enhancement. There is nonobstructive calcified calculus in midpole of the right kidney measures 2.3 mm. A cyst in midpole anterior aspect of the right kidney measures 1.2 cm.  Delayed renal images shows bilateral renal symmetrical excretion. Bilateral visualized proximal ureter is unremarkable.  No small bowel obstruction. No ascites or free air. No adenopathy. Moderate colonic stool. Normal appendix. No pericecal inflammation. The terminal ileum is unremarkable. Sigmoid colon diverticula are noted. No evidence of acute diverticulitis. Prostate gland measures 5.4 by 4.3 cm. Multiple radiation seeds are noted in prostate gland region. The urinary bladder is unremarkable. Seminal vesicles are unremarkable. There is no pelvic sidewall adenopathy. No inguinal adenopathy is noted.  IMPRESSION: 1. There is no evidence of metastatic disease within abdomen or pelvis. 2. No destructive bony lesions are noted. Degenerative changes lower thoracic and lumbar spine. Degenerative changes bilateral SI joints. 3. Extensive atherosclerotic vascular calcifications as described above. 4. Normal appendix. No pericecal inflammation. 5. Moderate colonic stool. Sigmoid colon diverticula. No evidence of acute diverticulitis. 6. Mild enlarged prostate gland. Radiation seeds are noted within prostate gland. The urinary bladder is unremarkable. No inguinal adenopathy.   CLINICAL DATA: 81 year old male with prostate cancer diagnosed in 2000 07/2002 status post radiation therapy, brachy therapy. Elevated PSA. Denies bone pain. Subsequent encounter.  EXAM: NUCLEAR MEDICINE WHOLE BODY BONE SCAN  TECHNIQUE: Whole body anterior and posterior images were obtained approximately 3  hours after intravenous injection of radiopharmaceutical.  RADIOPHARMACEUTICALS: 22.4 MCi Technetium-32m MDP IV  COMPARISON: CT Abdomen and Pelvis from today reported separately.  FINDINGS: Expected radiotracer activity in both kidneys and the bladder  Homogeneous radiotracer activity throughout the axial skeleton including the skull, spine, ribs, pelvis. Small volume perineum contamination.  Radiotracer activity throughout the visualized appendicular skeleton appears within normal limits; degenerative activity at both shoulders, in the left wrist, bilateral knee lateral compartments, and in the right foot.  IMPRESSION: No findings specific for metastatic disease to bone.   Electronically Signed  By: Genevie Ann M.D.  On: 04/24/2015 13:46           Vitals     Height Weight BMI (Calculated)    5\' 10"  (1.778 m) 169 lb 6.4 oz (76.839 kg) 24.4      Interpretation Summary     CLINICAL DATA: 81 year old male  with prostate cancer diagnosed in 2000 07/2002 status post radiation therapy, brachy therapy. Elevated PSA. Denies bone pain. Subsequent encounter.  EXAM: NUCLEAR MEDICINE WHOLE BODY BONE SCAN  TECHNIQUE: Whole body anterior and posterior images were obtained approximately 3 hours after intravenous injection of radiopharmaceutical.  RADIOPHARMACEUTICALS: 22.4 MCi Technetium-28m MDP IV  COMPARISON: CT Abdomen and Pelvis from today reported separately.  FINDINGS: Expected radiotracer activity in both kidneys and the bladder  Homogeneous radiotracer activity throughout the axial skeleton including the skull, spine, ribs, pelvis. Small volume perineum contamination.  Radiotracer activity throughout the visualized appendicular skeleton appears within normal limits; degenerative activity at both shoulders, in the left wrist, bilateral knee lateral compartments, and in the right foot.  IMPRESSION: No findings specific for metastatic  disease to bone.     Assessment & Plan:    The patient has bowel, recurrence after radiation therapy for Gleason 3+4 equal 7 prostate cancer. He has no signs of metastatic disease on imaging. Given his advanced age, if his PSA were to substantially rise in the future we may need to consider androgen deprivation therapy. For now, we will just monitor his PSA every 6 months.  1. Biochemical recurrence of prostate cancer -Follow-up in 6 months PSA prior   Return in about 6 months (around 10/30/2015) for with PSA prior.  Nickie Retort, MD  [facility] [address], [place], [place] [address] [phone]

## 2015-05-02 ENCOUNTER — Encounter: Payer: Self-pay | Admitting: Family Medicine

## 2015-05-05 DIAGNOSIS — H401133 Primary open-angle glaucoma, bilateral, severe stage: Secondary | ICD-10-CM | POA: Diagnosis not present

## 2015-05-05 DIAGNOSIS — Z87891 Personal history of nicotine dependence: Secondary | ICD-10-CM | POA: Diagnosis not present

## 2015-05-12 DIAGNOSIS — Z87891 Personal history of nicotine dependence: Secondary | ICD-10-CM | POA: Diagnosis not present

## 2015-05-12 DIAGNOSIS — H401133 Primary open-angle glaucoma, bilateral, severe stage: Secondary | ICD-10-CM | POA: Diagnosis not present

## 2015-05-12 DIAGNOSIS — Z961 Presence of intraocular lens: Secondary | ICD-10-CM | POA: Diagnosis not present

## 2015-06-26 ENCOUNTER — Other Ambulatory Visit: Payer: Self-pay | Admitting: *Deleted

## 2015-06-26 MED ORDER — ROSUVASTATIN CALCIUM 10 MG PO TABS: 10.0000 mg | ORAL_TABLET | Freq: Every day | ORAL | Status: DC

## 2015-06-29 DIAGNOSIS — H401133 Primary open-angle glaucoma, bilateral, severe stage: Secondary | ICD-10-CM | POA: Diagnosis not present

## 2015-07-31 DIAGNOSIS — Z961 Presence of intraocular lens: Secondary | ICD-10-CM | POA: Insufficient documentation

## 2015-07-31 DIAGNOSIS — H26491 Other secondary cataract, right eye: Secondary | ICD-10-CM | POA: Diagnosis not present

## 2015-08-12 ENCOUNTER — Other Ambulatory Visit: Payer: Self-pay | Admitting: Family Medicine

## 2015-08-12 DIAGNOSIS — I10 Essential (primary) hypertension: Secondary | ICD-10-CM

## 2015-08-12 DIAGNOSIS — C61 Malignant neoplasm of prostate: Secondary | ICD-10-CM

## 2015-08-12 DIAGNOSIS — E782 Mixed hyperlipidemia: Secondary | ICD-10-CM

## 2015-08-14 ENCOUNTER — Other Ambulatory Visit (INDEPENDENT_AMBULATORY_CARE_PROVIDER_SITE_OTHER): Payer: Medicare Other

## 2015-08-14 ENCOUNTER — Ambulatory Visit (INDEPENDENT_AMBULATORY_CARE_PROVIDER_SITE_OTHER): Payer: Medicare Other

## 2015-08-14 VITALS — BP 122/68 | HR 50 | Temp 97.5°F | Ht 70.0 in | Wt 162.5 lb

## 2015-08-14 DIAGNOSIS — E782 Mixed hyperlipidemia: Secondary | ICD-10-CM | POA: Diagnosis not present

## 2015-08-14 DIAGNOSIS — Z0000 Encounter for general adult medical examination without abnormal findings: Secondary | ICD-10-CM | POA: Diagnosis not present

## 2015-08-14 DIAGNOSIS — C61 Malignant neoplasm of prostate: Secondary | ICD-10-CM | POA: Diagnosis not present

## 2015-08-14 DIAGNOSIS — I10 Essential (primary) hypertension: Secondary | ICD-10-CM

## 2015-08-14 LAB — CBC WITH DIFFERENTIAL/PLATELET
BASOS ABS: 0 10*3/uL (ref 0.0–0.1)
Basophils Relative: 0.4 % (ref 0.0–3.0)
EOS ABS: 0.1 10*3/uL (ref 0.0–0.7)
Eosinophils Relative: 1.3 % (ref 0.0–5.0)
HCT: 39.8 % (ref 39.0–52.0)
Hemoglobin: 12.9 g/dL — ABNORMAL LOW (ref 13.0–17.0)
LYMPHS ABS: 1.1 10*3/uL (ref 0.7–4.0)
Lymphocytes Relative: 16.6 % (ref 12.0–46.0)
MCHC: 32.5 g/dL (ref 30.0–36.0)
MCV: 85.1 fl (ref 78.0–100.0)
MONOS PCT: 7.7 % (ref 3.0–12.0)
Monocytes Absolute: 0.5 10*3/uL (ref 0.1–1.0)
NEUTROS ABS: 5 10*3/uL (ref 1.4–7.7)
NEUTROS PCT: 74 % (ref 43.0–77.0)
PLATELETS: 212 10*3/uL (ref 150.0–400.0)
RBC: 4.68 Mil/uL (ref 4.22–5.81)
RDW: 15 % (ref 11.5–15.5)
WBC: 6.8 10*3/uL (ref 4.0–10.5)

## 2015-08-14 LAB — BASIC METABOLIC PANEL
BUN: 15 mg/dL (ref 6–23)
CO2: 30 meq/L (ref 19–32)
Calcium: 9.7 mg/dL (ref 8.4–10.5)
Chloride: 105 mEq/L (ref 96–112)
Creatinine, Ser: 0.85 mg/dL (ref 0.40–1.50)
GFR: 111.32 mL/min (ref >60.00)
GLUCOSE: 102 mg/dL — AB (ref 70–99)
POTASSIUM: 4 meq/L (ref 3.5–5.1)
SODIUM: 140 meq/L (ref 135–145)

## 2015-08-14 LAB — LIPID PANEL
CHOLESTEROL: 160 mg/dL (ref 0–200)
HDL: 43.2 mg/dL (ref >39.00)
LDL CALC: 94 mg/dL (ref 0–99)
NonHDL: 117.19
TRIGLYCERIDES: 117 mg/dL (ref 0.0–149.0)
Total CHOL/HDL Ratio: 4
VLDL: 23.4 mg/dL (ref 0.0–40.0)

## 2015-08-14 NOTE — Progress Notes (Signed)
Subjective:   Thomas Lopez is a 82 y.o. male who presents for Medicare Annual/Subsequent preventive examination.  Cardiac Risk Factors include: advanced age (>50men, >59 women);dyslipidemia;hypertension;male gender     Objective:    Vitals: BP 122/68 mmHg  Pulse 50  Temp(Src) 97.5 F (36.4 C) (Oral)  Ht 5\' 10"  (1.778 m)  Wt 162 lb 8 oz (73.71 kg)  BMI 23.32 kg/m2  SpO2 95%  Body mass index is 23.32 kg/(m^2).  Tobacco History  Smoking status  . Former Smoker  . Quit date: 05/23/1993  Smokeless tobacco  . Never Used     Counseling given: No   Past Medical History  Diagnosis Date  . HTN (hypertension)   . HLD (hyperlipidemia)   . Hyperglycemia   . Esophagitis   . ED (erectile dysfunction)     Trimix and VED  . IBS (irritable bowel syndrome)   . Colon polyps     rpt colonoscopy due 2014  . Glaucoma   . History of prostate cancer 2005    s/p seed implant, EBRT, PSA increased concern for recurrence (Dr. Alinda Money at [facility])  . Smoking history   . GERD (gastroesophageal reflux disease)   . Carotid stenosis 11/2014    mild bilateral 1-39%, rpt 2 yrs  . Cancer ([place])   . Essential hypertension 06/29/2009  . 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 Wrist release  6/98; 9/99    Right  . Ncs/wrists left carpal  2/02    Min./right improved  . Esophagogastroduodenoscopy  03/01/01    H.H.; gastritis esoph. dudodenitis  . Prostate biopsy  12/04    positive; radioactive seed implant (Dr. Joelyn Oms)  . Colonoscopy  03/01/01    Multiple polyps, divertic//adenomatous polyps  . Colonoscopy  04/24/01    multiple polyps  . Colonoscopy  10/23/01    Polyps benign-repeat every 2 years  . Colonoscopy  06/23/04    Adenom/hyperplastic polyps; multiple external hemorrhoids  . Colonoscopy  06/08/07    single small polyp-repeat 5 years  . Prostate ext beam radiation  1/27-07/23/03    Dr. Danny Lawless  . Spirometry  09/2011     WNL  . Pfts  10/2012    FVC 64%, FEV1 41%, ratio 0.62  . Cataract extraction, bilateral  December 2016    [place], Dr. Drema Dallas   Family History  Problem Relation Age of Onset  . Cancer Mother     colon  . Hyperlipidemia Mother   . Other Mother     Carotid disease  . Coronary artery disease Neg Hx   . Stroke Neg Hx    History  Sexual Activity  . Sexual Activity: Yes    Outpatient Encounter Prescriptions as of 08/14/2015  Medication Sig  . aspirin EC 81 MG tablet Take 81 mg by mouth daily.  . brimonidine (ALPHAGAN P) 0.1 % SOLN Apply 1 drop to eye at bedtime.  . dorzolamide-timolol (COSOPT) 22.3-6.8 MG/ML ophthalmic solution As directed  . latanoprost (XALATAN) 0.005 % ophthalmic solution Place 1 drop into both eyes at bedtime.  Marland Kitchen lisinopril (PRINIVIL,ZESTRIL) 10 MG tablet Take 10 mg by mouth daily.  . methylcellulose packet Take 1 each by mouth as needed.   Marland Kitchen omeprazole (PRILOSEC) 40 MG capsule take 1 capsule by mouth once daily  . rosuvastatin (CRESTOR) 10 MG tablet Take 1 tablet (10 mg total) by mouth daily.  . [DISCONTINUED] lisinopril-hydrochlorothiazide ([place])  10-12.5 MG tablet take 1 tablet by mouth once daily (Patient not taking: Reported on 05/01/2015)  . [DISCONTINUED] moxifloxacin (VIGAMOX) 0.5 % ophthalmic solution 1 drop.  . [DISCONTINUED] prednisoLONE acetate (PRED FORTE) 1 % ophthalmic suspension 1 drop.   No facility-administered encounter medications on file as of 08/14/2015.    Activities of Daily Living In your present state of health, do you have any difficulty performing the following activities: 08/14/2015  Hearing? N  Vision? N  Difficulty concentrating or making decisions? N  Walking or climbing stairs? N  Dressing or bathing? N  Doing errands, shopping? N  Preparing Food and eating ? N  Using the Toilet? N  In the past six months, have you accidently leaked urine? N  Do you have problems with loss of bowel control? N    Managing your Medications? N  Managing your Finances? N  Housekeeping or managing your Housekeeping? N    Patient Care Team: [name], MD as PCP - General (Family Medicine)   Raynelle Fanning, MD - Consulting Physician (Ophthalmology) Assessment:     Hearing Screening   125Hz  250Hz  500Hz  1000Hz  2000Hz  4000Hz  8000Hz   Right ear:   40 40 40 0   Left ear:   40 40 40 0   Vision Screening Comments: Last eye exam in March 2017   Exercise Activities and Dietary recommendations Current Exercise Habits: Home exercise routine, Type of exercise: Other - see comments (bowling 2 days per week for 2.5 hrs; walks when weather permits), Intensity: Moderate, Exercise limited by: None identified  Goals    . Eat more fruits and vegetables     Starting 08/17/15, I will increase my intake of fresh fruits and vegetables to at least 5 servings per day.       Fall Risk Fall Risk  08/14/2015 08/13/2014 08/09/2013 08/08/2012  Falls in the past year? No No Yes No  Number falls in past yr: - - 1 -  Injury with Fall? - - Yes -   Depression Screen PHQ 2/9 Scores 08/14/2015 08/13/2014 08/09/2013 08/08/2012  PHQ - 2 Score 0 0 0 0    Cognitive Testing MMSE - Mini Mental State Exam 08/14/2015  Orientation to time 5  Orientation to Place 5  Registration 3  Attention/ Calculation 0  Recall 3  Language- name 2 objects 0  Language- repeat 1  Language- follow 3 step command 3  Language- read & follow direction 0  Write a sentence 0  Copy design 0  Total score 20   PLEASE NOTE: A Mini-Cog screen was completed. Maximum score is 20. A value of 0 denotes this part of [place] MMSE was not completed.  Orientation to Time - Max 5 Orientation to Place - Max 5 Registration - Max 3 Recall - Max 3 Language Repeat - Max 1 Language Follow 3 Step Command - Max 3   Immunization History  Administered Date(s) Administered  . H1N1 06/24/2008  . Influenza Split 03/03/2011, 02/29/2012  . Influenza Whole  04/06/2007, 02/19/2008, 03/24/2009, 02/25/2010  . Influenza,inj,Quad PF,36+ Mos 02/26/2013, 02/19/2014, 02/25/2015  . Pneumococcal Conjugate-13 08/09/2013  . Pneumococcal Polysaccharide-23 03/15/2002  . Td 05/26/2006  . Zoster 07/01/2010   Screening Tests Health Maintenance  Topic Date Due  . INFLUENZA VACCINE  12/22/2015  . TETANUS/TDAP  05/26/2016  . COLONOSCOPY  09/24/2017  . DTaP/Tdap/Td  Completed  . ZOSTAVAX  Completed  . PNA vac Low Risk Adult  Completed      Plan:  I have personally reviewed and addressed the Medicare Annual Wellness questionnaire and have noted the following in the patient's chart:  A. Medical and social history B. Use of alcohol, tobacco or illicit drugs  C. Current medications and supplements D. Functional ability and status E.  Nutritional status F.  Physical activity G. Advance directives H. List of other physicians I.  Hospitalizations, surgeries, and ER visits in previous 12 months J.  [place] to include hearing, vision, cognitive, depression L. Referrals and appointments - none  In addition, I have reviewed and discussed with patient certain preventive protocols, quality metrics, and best practice recommendations. A written personalized care plan for preventive services as well as general preventive health recommendations were provided to patient.  See attached scanned questionnaire for additional information.   Signed,   Lindell Noe, MHA, BS, LPN Health Advisor [id number]

## 2015-08-14 NOTE — Patient Instructions (Signed)
Thomas Lopez , Thank you for taking time to come for your Medicare Wellness Visit. I appreciate your ongoing commitment to your health goals. Please review the following plan we discussed and let me know if I can assist you in the future.   These are the goals we discussed: Goals    . Eat more fruits and vegetables     Starting 08/17/15, I will increase my intake of fresh fruits and vegetables to at least 5 servings per day.        This is a list of the screening recommended for you and due dates:  Health Maintenance  Topic Date Due  . Flu Shot  12/22/2015  . Tetanus Vaccine  05/26/2016  . Colon Cancer Screening  09/24/2017  . DTaP/Tdap/Td vaccine  Completed  . Shingles Vaccine  Completed  . Pneumonia vaccines  Completed   Preventive Care for Adults  A healthy lifestyle and preventive care can promote health and wellness. Preventive health guidelines for adults include the following key practices.  . A routine yearly physical is a good way to check with your health care provider about your health and preventive screening. It is a chance to share any concerns and updates on your health and to receive a thorough exam.  . Visit your dentist for a routine exam and preventive care every 6 months. Brush your teeth twice a day and floss once a day. Good oral hygiene prevents tooth decay and gum disease.  . The frequency of eye exams is based on your age, health, family medical history, use  of contact lenses, and other factors. Follow your health care provider's ecommendations for frequency of eye exams.  . Eat a healthy diet. Foods like vegetables, fruits, whole grains, low-fat dairy products, and lean protein foods contain the nutrients you need without too many calories. Decrease your intake of foods high in solid fats, added sugars, and salt. Eat the right amount of calories for you. Get information about a proper diet from your health care provider, if necessary.  . Regular physical  exercise is one of the most important things you can do for your health. Most adults should get at least 150 minutes of moderate-intensity exercise (any activity that increases your heart rate and causes you to sweat) each week. In addition, most adults need muscle-strengthening exercises on 2 or more days a week.  Silver Sneakers may be a benefit available to you. To determine eligibility, you may visit the website: www.silversneakers.com or contact program at 201-136-2977 Mon-Fri between 8AM-8PM.   . Maintain a healthy weight. The body mass index (BMI) is a screening tool to identify possible weight problems. It provides an estimate of body fat based on height and weight. Your health care provider can find your BMI and can help you achieve or maintain a healthy weight.   For adults 20 years and older: ? A BMI below 18.5 is considered underweight. ? A BMI of 18.5 to 24.9 is normal. ? A BMI of 25 to 29.9 is considered overweight. ? A BMI of 30 and above is considered obese.   . Maintain normal blood lipids and cholesterol levels by exercising and minimizing your intake of saturated fat. Eat a balanced diet with plenty of fruit and vegetables. Blood tests for lipids and cholesterol should begin at age 66 and be repeated every 5 years. If your lipid or cholesterol levels are high, you are over 50, or you are at high risk for heart disease, you may  need your cholesterol levels checked more frequently. Ongoing high lipid and cholesterol levels should be treated with medicines if diet and exercise are not working.  . If you smoke, find out from your health care provider how to quit. If you do not use tobacco, please do not start.  . If you choose to drink alcohol, please do not consume more than 2 drinks per day. One drink is considered to be 12 ounces (355 mL) of beer, 5 ounces (148 mL) of wine, or 1.5 ounces (44 mL) of liquor.  . If you are 23-4 years old, ask your health care provider if you  should take aspirin to prevent strokes.  . Use sunscreen. Apply sunscreen liberally and repeatedly throughout the day. You should seek shade when your shadow is shorter than you. Protect yourself by wearing long sleeves, pants, a wide-brimmed hat, and sunglasses year round, whenever you are outdoors.  . Once a month, do a whole body skin exam, using a mirror to look at the skin on your back. Tell your health care provider of new moles, moles that have irregular borders, moles that are larger than a pencil eraser, or moles that have changed in shape or color.

## 2015-08-14 NOTE — Progress Notes (Signed)
Pre visit review using our clinic review tool, if applicable. No additional management support is needed unless otherwise documented below in the visit note. 

## 2015-08-15 NOTE — Progress Notes (Signed)
   Subjective:    Patient ID: Thomas Lopez, male    DOB: 07-Jun-1932, 82 y.o.   MRN: [id number]  HPI  I reviewed health advisor's note, was available for consultation, and agree with documentation and plan.  Also, noted HR lower than usual at 52. Will send FYI to PCP.   Review of Systems     Objective:   Physical Exam        Assessment & Plan:

## 2015-08-18 ENCOUNTER — Ambulatory Visit (INDEPENDENT_AMBULATORY_CARE_PROVIDER_SITE_OTHER): Payer: Medicare Other | Admitting: Family Medicine

## 2015-08-18 ENCOUNTER — Encounter: Payer: Self-pay | Admitting: Family Medicine

## 2015-08-18 VITALS — BP 128/62 | HR 60 | Temp 97.6°F | Wt 161.5 lb

## 2015-08-18 DIAGNOSIS — Z0000 Encounter for general adult medical examination without abnormal findings: Secondary | ICD-10-CM | POA: Diagnosis not present

## 2015-08-18 DIAGNOSIS — Z860100 Personal history of colon polyps, unspecified: Secondary | ICD-10-CM

## 2015-08-18 DIAGNOSIS — I700 Atherosclerosis of aorta: Secondary | ICD-10-CM

## 2015-08-18 DIAGNOSIS — Z7189 Other specified counseling: Secondary | ICD-10-CM

## 2015-08-18 DIAGNOSIS — E782 Mixed hyperlipidemia: Secondary | ICD-10-CM

## 2015-08-18 DIAGNOSIS — I10 Essential (primary) hypertension: Secondary | ICD-10-CM

## 2015-08-18 DIAGNOSIS — I708 Atherosclerosis of other arteries: Secondary | ICD-10-CM

## 2015-08-18 DIAGNOSIS — R739 Hyperglycemia, unspecified: Secondary | ICD-10-CM | POA: Diagnosis not present

## 2015-08-18 DIAGNOSIS — C61 Malignant neoplasm of prostate: Secondary | ICD-10-CM

## 2015-08-18 DIAGNOSIS — S4991XA Unspecified injury of right shoulder and upper arm, initial encounter: Secondary | ICD-10-CM | POA: Insufficient documentation

## 2015-08-18 DIAGNOSIS — S4991XS Unspecified injury of right shoulder and upper arm, sequela: Secondary | ICD-10-CM | POA: Diagnosis not present

## 2015-08-18 DIAGNOSIS — Z1211 Encounter for screening for malignant neoplasm of colon: Secondary | ICD-10-CM

## 2015-08-18 DIAGNOSIS — I70299 Other atherosclerosis of native arteries of extremities, unspecified extremity: Secondary | ICD-10-CM

## 2015-08-18 DIAGNOSIS — R413 Other amnesia: Secondary | ICD-10-CM

## 2015-08-18 NOTE — Assessment & Plan Note (Signed)
Followed by urology. Appreciate their care.

## 2015-08-18 NOTE — Assessment & Plan Note (Signed)
Preventative protocols reviewed and updated unless pt declined. Discussed healthy diet and lifestyle.  

## 2015-08-18 NOTE — Assessment & Plan Note (Signed)
Chronic, stable. Continue current regimen. 

## 2015-08-18 NOTE — Patient Instructions (Addendum)
Pass by lab to pick up stool kit Bring [place] copy of your living will/advanced directive You are doing well today. Return as needed or in 1 year for next physical.  Health Maintenance, Male A healthy lifestyle and preventative care can promote health and wellness.  Maintain regular health, dental, and eye exams.  Eat a healthy diet. Foods like vegetables, fruits, whole grains, low-fat dairy products, and lean protein foods contain the nutrients you need and are low in calories. Decrease your intake of foods high in solid fats, added sugars, and salt. Get information about a proper diet from your health care provider, if necessary.  Regular physical exercise is one of the most important things you can do for your health. Most adults should get at least 150 minutes of moderate-intensity exercise (any activity that increases your heart rate and causes you to sweat) each week. In addition, most adults need muscle-strengthening exercises on 2 or more days a week.   Maintain a healthy weight. The body mass index (BMI) is a screening tool to identify possible weight problems. It provides an estimate of body fat based on height and weight. Your health care provider can find your BMI and can help you achieve or maintain a healthy weight. For males 20 years and older:  A BMI below 18.5 is considered underweight.  A BMI of 18.5 to 24.9 is normal.  A BMI of 25 to 29.9 is considered overweight.  A BMI of 30 and above is considered obese.  Maintain normal blood lipids and cholesterol by exercising and minimizing your intake of saturated fat. Eat a balanced diet with plenty of fruits and vegetables. Blood tests for lipids and cholesterol should begin at age 76 and be repeated every 5 years. If your lipid or cholesterol levels are high, you are over age 43, or you are at high risk for heart disease, you may need your cholesterol levels checked more frequently.Ongoing high lipid and cholesterol levels should  be treated with medicines if diet and exercise are not working.  If you smoke, find out from your health care provider how to quit. If you do not use tobacco, do not start.  Lung cancer screening is recommended for adults aged 61-80 years who are at high risk for developing lung cancer because of a history of smoking. A yearly low-dose CT scan of the lungs is recommended for people who have at least a 30-pack-year history of smoking and are current smokers or have quit within the past 15 years. A pack year of smoking is smoking an average of 1 pack of cigarettes a day for 1 year (for example, a 30-pack-year history of smoking could mean smoking 1 pack a day for 30 years or 2 packs a day for 15 years). Yearly screening should continue until the smoker has stopped smoking for at least 15 years. Yearly screening should be stopped for people who develop a health problem that would prevent them from having lung cancer treatment.  If you choose to drink alcohol, do not have more than 2 drinks per day. One drink is considered to be 12 oz (360 mL) of beer, 5 oz (150 mL) of wine, or 1.5 oz (45 mL) of liquor.  Avoid the use of street drugs. Do not share needles with anyone. Ask for help if you need support or instructions about stopping the use of drugs.  High blood pressure causes heart disease and increases the risk of stroke. High blood pressure is more likely to  develop in:  People who have blood pressure in the end of the normal range (100-139/85-89 mm Hg).  People who are overweight or obese.  People who are African American.  If you are 61-80 years of age, have your blood pressure checked every 3-5 years. If you are 47 years of age or older, have your blood pressure checked every year. You should have your blood pressure measured twice--once when you are at a hospital or clinic, and once when you are not at a hospital or clinic. Record the average of the two measurements. To check your blood pressure  when you are not at a hospital or clinic, you can use:  An automated blood pressure machine at a pharmacy.  A home blood pressure monitor.  If you are 64-34 years old, ask your health care provider if you should take aspirin to prevent heart disease.  Diabetes screening involves taking a blood sample to check your fasting blood sugar level. This should be done once every 3 years after age 51 if you are at a normal weight and without risk factors for diabetes. Testing should be considered at a younger age or be carried out more frequently if you are overweight and have at least 1 risk factor for diabetes.  Colorectal cancer can be detected and often prevented. Most routine colorectal cancer screening begins at the age of 47 and continues through age 8. However, your health care provider may recommend screening at an earlier age if you have risk factors for colon cancer. On a yearly basis, your health care provider may provide home test kits to check for hidden blood in the stool. A small camera at the end of a tube may be used to directly examine the colon (sigmoidoscopy or colonoscopy) to detect the earliest forms of colorectal cancer. Talk to your health care provider about this at age 65 when routine screening begins. A direct exam of the colon should be repeated every 5-10 years through age 108, unless early forms of precancerous polyps or small growths are found.  People who are at an increased risk for hepatitis B should be screened for this virus. You are considered at high risk for hepatitis B if:  You were born in a country where hepatitis B occurs often. Talk with your health care provider about which countries are considered high risk.  Your parents were born in a high-risk country and you have not received a shot to protect against hepatitis B (hepatitis B vaccine).  You have HIV or AIDS.  You use needles to inject street drugs.  You live with, or have sex with, someone who has  hepatitis B.  You are a man who has sex with other men (MSM).  You get hemodialysis treatment.  You take certain medicines for conditions like cancer, organ transplantation, and autoimmune conditions.  Hepatitis C blood testing is recommended for all people born from [birth year] through 1963 and any individual with known risk factors for hepatitis C.  Healthy men should no longer receive prostate-specific antigen (PSA) blood tests as part of routine cancer screening. Talk to your health care provider about prostate cancer screening.  Testicular cancer screening is not recommended for adolescents or adult males who have no symptoms. Screening includes self-exam, a health care provider exam, and other screening tests. Consult with your health care provider about any symptoms you have or any concerns you have about testicular cancer.  Practice safe sex. Use condoms and avoid high-risk sexual practices to reduce  the spread of sexually transmitted infections (STIs).  You should be screened for STIs, including gonorrhea and chlamydia if:  You are sexually active and are younger than 24 years.  You are older than 24 years, and your health care provider tells you that you are at risk for this type of infection.  Your sexual activity has changed since you were last screened, and you are at an increased risk for chlamydia or gonorrhea. Ask your health care provider if you are at risk.  If you are at risk of being infected with HIV, it is recommended that you take a prescription medicine daily to prevent HIV infection. This is called pre-exposure prophylaxis (PrEP). You are considered at risk if:  You are a man who has sex with other men (MSM).  You are a heterosexual man who is sexually active with multiple partners.  You take drugs by injection.  You are sexually active with a partner who has HIV.  Talk with your health care provider about whether you are at high risk of being infected with HIV. If  you choose to begin PrEP, you should first be tested for HIV. You should then be tested every 3 months for as long as you are taking PrEP.  Use sunscreen. Apply sunscreen liberally and repeatedly throughout the day. You should seek shade when your shadow is shorter than you. Protect yourself by wearing long sleeves, pants, a wide-brimmed hat, and sunglasses year round whenever you are outdoors.  Tell your health care provider of new moles or changes in moles, especially if there is a change in shape or color. Also, tell your health care provider if a mole is larger than the size of a pencil eraser.  A one-time screening for abdominal aortic aneurysm (AAA) and surgical repair of large AAAs by ultrasound is recommended for men aged 65-75 years who are current or former smokers.  Stay current with your vaccines (immunizations).   This information is not intended to replace advice given to you by your health care provider. Make sure you discuss any questions you have with your health care provider.   Document Released: 11/05/2007 Document Revised: 05/30/2014 Document Reviewed: 10/04/2010 Elsevier Interactive Patient Education 2016 [employer] Inc.  

## 2015-08-18 NOTE — Assessment & Plan Note (Signed)
Normal minicog by health advisor last week. Pt denies concerns.

## 2015-08-18 NOTE — Progress Notes (Signed)
Pre visit review using our clinic review tool, if applicable. No additional management support is needed unless otherwise documented below in the visit note. 
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
## 2015-08-18 NOTE — Assessment & Plan Note (Signed)
Advanced directives - has completed living will with attorney at home. Will bring us copy. Has youngest daughter designated as proxy for health decisions in case he cannot make them.  
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
## 2015-08-18 NOTE — Assessment & Plan Note (Signed)
Great control on current statin. Continue.

## 2015-08-18 NOTE — Assessment & Plan Note (Signed)
Unsuccessful colonoscopy due to tortuous colon 2014 [name]). Discussed options of continued screening vs aging out. Pt opts to check stool kit today.

## 2015-08-18 NOTE — Assessment & Plan Note (Signed)
Minimal - continue to monitor.  

## 2015-08-18 NOTE — Assessment & Plan Note (Signed)
Continue aspirin, statin.  

## 2015-08-19 DIAGNOSIS — H52203 Unspecified astigmatism, bilateral: Secondary | ICD-10-CM | POA: Diagnosis not present

## 2015-08-19 DIAGNOSIS — H524 Presbyopia: Secondary | ICD-10-CM | POA: Diagnosis not present

## 2015-09-07 DIAGNOSIS — H401133 Primary open-angle glaucoma, bilateral, severe stage: Secondary | ICD-10-CM | POA: Diagnosis not present

## 2015-09-22 ENCOUNTER — Other Ambulatory Visit: Payer: Medicare Other

## 2015-09-22 DIAGNOSIS — Z1211 Encounter for screening for malignant neoplasm of colon: Secondary | ICD-10-CM

## 2015-09-22 LAB — FECAL OCCULT BLOOD, IMMUNOCHEMICAL: Fecal Occult Bld: NEGATIVE

## 2015-09-22 LAB — FECAL OCCULT BLOOD, GUAIAC: FECAL OCCULT BLD: NEGATIVE

## 2015-09-23 ENCOUNTER — Encounter: Payer: Self-pay | Admitting: *Deleted

## 2015-10-06 ENCOUNTER — Ambulatory Visit (INDEPENDENT_AMBULATORY_CARE_PROVIDER_SITE_OTHER): Payer: Medicare Other | Admitting: Family Medicine

## 2015-10-06 ENCOUNTER — Encounter: Payer: Self-pay | Admitting: Family Medicine

## 2015-10-06 VITALS — BP 122/62 | HR 64 | Temp 97.8°F | Wt 159.0 lb

## 2015-10-06 DIAGNOSIS — S4991XS Unspecified injury of right shoulder and upper arm, sequela: Secondary | ICD-10-CM
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
## 2015-10-06 NOTE — Assessment & Plan Note (Addendum)
Again reviewed possible biceps tendon rupture vs calcified hematoma. Feels smaller than previously however given now noticing more pain, affecting bowling game, and now desired definitive management will order MRI RUE. Discussed likely outside of window for repair given injury occurred 05/2015. Discussed ortho vs PT referral pending imaging findings. Pt agrees with plan.

## 2015-10-06 NOTE — Patient Instructions (Signed)
I'm still suspicious for biceps tendon rupture - we will order MRI of right upper arm to further evaluate injury and may refer you to ortho or physical therapy. Continue aleve as needed. Good to see you today.

## 2015-10-06 NOTE — Progress Notes (Signed)
Pre visit review using our clinic review tool, if applicable. No additional management support is needed unless otherwise documented below in the visit note. 

## 2015-10-08 ENCOUNTER — Ambulatory Visit (HOSPITAL_COMMUNITY)
Admission: RE | Admit: 2015-10-08 | Discharge: 2015-10-08 | Disposition: A | Discharge status: Home / Self Care | Payer: Medicare Other | Source: Ambulatory Visit | Attending: Family Medicine | Admitting: Family Medicine

## 2015-10-08 DIAGNOSIS — S46111S Strain of muscle, fascia and tendon of long head of biceps, right arm, sequela: Secondary | ICD-10-CM | POA: Diagnosis not present

## 2015-10-08 DIAGNOSIS — S4991XS Unspecified injury of right shoulder and upper arm, sequela: Secondary | ICD-10-CM | POA: Insufficient documentation

## 2015-10-08 DIAGNOSIS — W19XXXS Unspecified fall, sequela: Secondary | ICD-10-CM | POA: Diagnosis not present

## 2015-10-08 DIAGNOSIS — S4991XA Unspecified injury of right shoulder and upper arm, initial encounter: Secondary | ICD-10-CM | POA: Diagnosis not present

## 2015-10-08 DIAGNOSIS — M79601 Pain in right arm: Secondary | ICD-10-CM | POA: Diagnosis not present

## 2015-10-08 LAB — POCT I-STAT CREATININE: CREATININE: 1.1 mg/dL (ref 0.61–1.24)

## 2015-10-08 MED ORDER — GADOBENATE DIMEGLUMINE 529 MG/ML IV SOLN
15.0000 mL | Freq: Once | INTRAVENOUS | Status: AC | PRN
  Administered 2015-10-08: 15 mL via INTRAVENOUS

## 2015-10-10 ENCOUNTER — Other Ambulatory Visit: Payer: Self-pay | Admitting: Family Medicine

## 2015-10-10 DIAGNOSIS — S4991XS Unspecified injury of right shoulder and upper arm, sequela: Secondary | ICD-10-CM

## 2015-10-10 DIAGNOSIS — S46211S Strain of muscle, fascia and tendon of other parts of biceps, right arm, sequela: Secondary | ICD-10-CM

## 2015-10-21 DIAGNOSIS — S46101A Unspecified injury of muscle, fascia and tendon of long head of biceps, right arm, initial encounter: Secondary | ICD-10-CM | POA: Insufficient documentation

## 2015-10-21 DIAGNOSIS — S4991XA Unspecified injury of right shoulder and upper arm, initial encounter: Secondary | ICD-10-CM | POA: Diagnosis not present

## 2015-10-21 DIAGNOSIS — M7581 Other shoulder lesions, right shoulder: Secondary | ICD-10-CM | POA: Diagnosis not present

## 2015-10-21 DIAGNOSIS — M75121 Complete rotator cuff tear or rupture of right shoulder, not specified as traumatic: Secondary | ICD-10-CM | POA: Diagnosis not present

## 2015-10-27 ENCOUNTER — Other Ambulatory Visit: Payer: Self-pay

## 2015-10-27 DIAGNOSIS — Z8546 Personal history of malignant neoplasm of prostate: Secondary | ICD-10-CM

## 2015-10-28 ENCOUNTER — Other Ambulatory Visit: Payer: Medicare Other

## 2015-10-28 DIAGNOSIS — Z8546 Personal history of malignant neoplasm of prostate: Secondary | ICD-10-CM

## 2015-10-28 DIAGNOSIS — M25511 Pain in right shoulder: Secondary | ICD-10-CM | POA: Diagnosis not present

## 2015-10-28 DIAGNOSIS — M6281 Muscle weakness (generalized): Secondary | ICD-10-CM | POA: Diagnosis not present

## 2015-10-28 DIAGNOSIS — M25611 Stiffness of right shoulder, not elsewhere classified: Secondary | ICD-10-CM | POA: Diagnosis not present

## 2015-10-29 LAB — PSA: Prostate Specific Ag, Serum: 19.4 ng/mL — ABNORMAL HIGH (ref 0.0–4.0)

## 2015-11-02 DIAGNOSIS — M25611 Stiffness of right shoulder, not elsewhere classified: Secondary | ICD-10-CM | POA: Diagnosis not present

## 2015-11-02 DIAGNOSIS — M25511 Pain in right shoulder: Secondary | ICD-10-CM | POA: Diagnosis not present

## 2015-11-02 DIAGNOSIS — M6281 Muscle weakness (generalized): Secondary | ICD-10-CM | POA: Diagnosis not present

## 2015-11-04 ENCOUNTER — Ambulatory Visit: Payer: 59

## 2015-11-04 DIAGNOSIS — M25511 Pain in right shoulder: Secondary | ICD-10-CM | POA: Diagnosis not present

## 2015-11-04 DIAGNOSIS — M6281 Muscle weakness (generalized): Secondary | ICD-10-CM | POA: Diagnosis not present

## 2015-11-04 DIAGNOSIS — M25611 Stiffness of right shoulder, not elsewhere classified: Secondary | ICD-10-CM | POA: Diagnosis not present

## 2015-11-05 ENCOUNTER — Encounter: Payer: Self-pay | Admitting: Urology

## 2015-11-05 ENCOUNTER — Ambulatory Visit (INDEPENDENT_AMBULATORY_CARE_PROVIDER_SITE_OTHER): Payer: Medicare Other | Admitting: Urology

## 2015-11-05 VITALS — BP 141/65 | HR 60 | Ht 69.0 in | Wt 158.5 lb

## 2015-11-05 DIAGNOSIS — C61 Malignant neoplasm of prostate: Secondary | ICD-10-CM

## 2015-11-05 MED ORDER — DEGARELIX ACETATE 120 MG ~~LOC~~ SOLR
240.0000 mg | Freq: Once | SUBCUTANEOUS | Status: AC
Start: 2015-11-05 — End: 2015-11-05
  Administered 2015-11-05: 240 mg via SUBCUTANEOUS

## 2015-11-05 NOTE — Progress Notes (Signed)
11/05/2015 10:57 AM   Thomas Lopez [birth year] [id number]  Referring provider: [name], MD [address] [place], [place] [address]  Chief Complaint  Patient presents with  . Follow-up    elevated PSA, Prostate cancer     HPI: The patient is an 82 year old gentleman with a history of prostate cancer presents to transfer his care from Dr. Alinda Money in [place]. He is status post treatment with external beam radiation therapy and radiation seed implant in 2005 with Dr. Joelyn Oms and Dr. Danny Lawless for T1c Gleason 3+4 = 7 prostate cancer with a pretreatment PSA of 6.8. In November 2015, he was found to have biochemical recurrence.  PSA nadir after treatment: 0.42 (2010)  November 2015: Biochemical recurrence  At this time he is asymptomatic. He denies any voiding symptoms. He denies weight loss or bone pain. He has no hematuria.  He also on the past was on Trimix and use a vacuum erection device. He is not interested in those medications at this time.   10/28/2015: 19.4 03/27/2015: 11.1 12/04/2014: 9.58 04/09/2014: 5.26 04/03/2013: 2.1 04/04/2012: 1.35 10/04/2011: 1.15 02/08/2008: 0.49  Interval History: The patient's PSA is now risen to 19.4. His CT scan and bone scan in December 2016 were negative. He has no new pain in the spine. He is voiding without difficulty.   PMH: Past Medical History  Diagnosis Date  . HTN (hypertension)   . HLD (hyperlipidemia)   . Hyperglycemia   . Esophagitis   . ED (erectile dysfunction)     Trimix and VED  . IBS (irritable bowel syndrome)   . Colon polyps     rpt colonoscopy due 2014  . Glaucoma   . History of prostate cancer 2005    s/p seed implant, EBRT, PSA increased concern for recurrence (Dr. Alinda [facility] at [facility])  . Smoking history   . GERD (gastroesophageal reflux disease)   . Carotid stenosis 11/2014    mild bilateral 1-39%, rpt 2 yrs  . Cancer ([place])   . Essential hypertension 06/29/2009  . Aorto-iliac  atherosclerosis ([place]) 04/2015    by CT scan    Surgical History: Past Surgical History  Procedure Laterality Date  . Wrist release  6/98; 9/99    Right  . Ncs/wrists left carpal  2/02    Min./right improved  . Esophagogastroduodenoscopy  03/01/01    H.H.; gastritis esoph. dudodenitis  . Prostate biopsy  12/04    positive; radioactive seed implant (Dr. Joelyn Oms)  . Colonoscopy  03/01/01    Multiple polyps, divertic//adenomatous polyps  . Colonoscopy  04/24/01    multiple polyps  . Colonoscopy  10/23/01    Polyps benign-repeat every 2 years  . Colonoscopy  06/23/04    Adenom/hyperplastic polyps; multiple external hemorrhoids  . Colonoscopy  06/08/07    single small polyp-repeat 5 years  . Prostate ext beam radiation  1/27-07/23/03    Dr. Danny Lawless  . Spirometry  09/2011    WNL  . Pfts  10/2012    FVC 64%, FEV1 41%, ratio 0.62  . Cataract extraction, bilateral  December 2016    [place], Dr. Raynelle Fanning    Home Medications:    Medication List       This list is accurate as of: 11/05/15 10:57 AM.  Always use your most recent med list.               aspirin EC 81 MG tablet  Take 81 mg by mouth daily.  brimonidine 0.1 % Soln  Commonly known as:  ALPHAGAN P  Apply 1 drop to eye at bedtime.     dorzolamide-timolol 22.3-6.8 MG/ML ophthalmic solution  Commonly known as:  COSOPT  As directed     latanoprost 0.005 % ophthalmic solution  Commonly known as:  XALATAN  Place 1 drop into both eyes at bedtime.     lisinopril 10 MG tablet  Commonly known as:  PRINIVIL,ZESTRIL  Take 10 mg by mouth daily.     methylcellulose packet  Take 1 each by mouth as needed. Reported on 11/05/2015     omeprazole 40 MG capsule  Commonly known as:  PRILOSEC  take 1 capsule by mouth once daily     rosuvastatin 10 MG tablet  Commonly known as:  CRESTOR  Take 1 tablet (10 mg total) by mouth daily.        Allergies: No Known Allergies  Family History: Family History    Problem Relation Age of Onset  . Cancer Mother     colon  . Hyperlipidemia Mother   . Other Mother     Carotid disease  . Coronary artery disease Neg Hx   . Stroke Neg Hx     Social History:  reports that he quit smoking about 22 years ago. He has never used smokeless tobacco. He reports that he drinks alcohol. He reports that he does not use illicit drugs.  ROS: UROLOGY Frequent Urination?: Yes Hard to postpone urination?: Yes Burning/pain with urination?: No Get up at night to urinate?: Yes Leakage of urine?: No Urine stream starts and stops?: No Trouble starting stream?: No Do you have to strain to urinate?: No Blood in urine?: No Urinary tract infection?: No Sexually transmitted disease?: No Injury to kidneys or bladder?: No Painful intercourse?: No Weak stream?: No Erection problems?: Yes Penile pain?: No  Gastrointestinal Nausea?: No Vomiting?: No Indigestion/heartburn?: No Diarrhea?: No Constipation?: No  Constitutional Fever: No Night sweats?: No Weight loss?: No Fatigue?: No  Skin Skin rash/lesions?: No Itching?: No  Eyes Blurred vision?: No Double vision?: No  Ears/Nose/Throat Sore throat?: No Sinus problems?: No  Hematologic/Lymphatic Easy bruising?: No  Cardiovascular Leg swelling?: No Chest pain?: No  Respiratory Cough?: No Shortness of breath?: No  Endocrine Excessive thirst?: No  Musculoskeletal Back pain?: No Joint pain?: No  Neurological Headaches?: No Dizziness?: No  Psychologic Depression?: No Anxiety?: No  Physical Exam: BP 141/65 mmHg  Pulse 60  Ht 5\' 9"  (1.753 m)  Wt 158 lb 8 oz (71.895 kg)  BMI 23.40 kg/m2  Constitutional:  Alert and oriented, No acute distress. HEENT: [place] AT, moist mucus membranes.  Trachea midline, no masses. Cardiovascular: No clubbing, cyanosis, or edema. Respiratory: Normal respiratory effort, no increased work of breathing. GI: Abdomen is soft, nontender, nondistended, no  abdominal masses GU: No CVA tenderness.  Skin: No rashes, bruises or suspicious lesions. Lymph: No cervical or inguinal adenopathy. Neurologic: Grossly intact, no focal deficits, moving all 4 extremities. Psychiatric: Normal mood and affect.  Laboratory Data: Lab Results  Component Value Date   WBC 6.8 08/14/2015   HGB 12.9* 08/14/2015   HCT 39.8 08/14/2015   MCV 85.1 08/14/2015   PLT 212.0 08/14/2015    Lab Results  Component Value Date   CREATININE 1.10 10/08/2015    Lab Results  Component Value Date   PSA 5.61* 08/06/2014   PSA 3.39 08/02/2013   PSA 1.54 08/01/2012    No results found for: TESTOSTERONE  Lab Results  Component  Value Date   HGBA1C 5.6 04/19/2005    Urinalysis No results found for: COLORURINE, APPEARANCEUR, LABSPEC, PHURINE, GLUCOSEU, HGBUR, BILIRUBINUR, KETONESUR, PROTEINUR, UROBILINOGEN, NITRITE, LEUKOCYTESUR   Assessment & Plan:    1. Biochemical recurrence of prostate cancer The patient has biochemical recurrence of his prostate cancer with a very quick doubling time of his PSA. He had negative imaging studies approximately 6 months ago. We discussed different modalities for treatment of his recurrence. He has elected to undergo androgen deprivation therapy. He was given a shot of firmagon 240 mg subcutaneously today. We will transition his care to the cancer center for continued androgen deprivation therapy.  Return for at cancer center. See urology prn.  Nickie Retort, MD  [facility] [address], [place], [place] [address] [phone]

## 2015-11-05 NOTE — Addendum Note (Signed)
Addended by: Wilson Singer on: 11/05/2015 11:28 AM   Modules accepted: Orders

## 2015-11-06 ENCOUNTER — Encounter: Payer: Self-pay | Admitting: Family Medicine

## 2015-11-09 DIAGNOSIS — M25611 Stiffness of right shoulder, not elsewhere classified: Secondary | ICD-10-CM | POA: Diagnosis not present

## 2015-11-09 DIAGNOSIS — M6281 Muscle weakness (generalized): Secondary | ICD-10-CM | POA: Diagnosis not present

## 2015-11-09 DIAGNOSIS — M25511 Pain in right shoulder: Secondary | ICD-10-CM | POA: Diagnosis not present

## 2015-11-09 DIAGNOSIS — M7581 Other shoulder lesions, right shoulder: Secondary | ICD-10-CM | POA: Diagnosis not present

## 2015-11-11 ENCOUNTER — Inpatient Hospital Stay: Payer: Medicare Other | Attending: Internal Medicine | Admitting: Internal Medicine

## 2015-11-11 VITALS — BP 125/73 | HR 54 | Temp 96.1°F | Resp 18 | Ht 68.5 in | Wt 157.1 lb

## 2015-11-11 DIAGNOSIS — R9721 Rising PSA following treatment for malignant neoplasm of prostate: Secondary | ICD-10-CM | POA: Diagnosis not present

## 2015-11-11 DIAGNOSIS — Z86010 Personal history of colonic polyps: Secondary | ICD-10-CM | POA: Diagnosis not present

## 2015-11-11 DIAGNOSIS — Z79899 Other long term (current) drug therapy: Secondary | ICD-10-CM | POA: Diagnosis not present

## 2015-11-11 DIAGNOSIS — I10 Essential (primary) hypertension: Secondary | ICD-10-CM

## 2015-11-11 DIAGNOSIS — K219 Gastro-esophageal reflux disease without esophagitis: Secondary | ICD-10-CM

## 2015-11-11 DIAGNOSIS — Z87891 Personal history of nicotine dependence: Secondary | ICD-10-CM | POA: Diagnosis not present

## 2015-11-11 DIAGNOSIS — Z923 Personal history of irradiation: Secondary | ICD-10-CM | POA: Diagnosis not present

## 2015-11-11 DIAGNOSIS — C61 Malignant neoplasm of prostate: Secondary | ICD-10-CM | POA: Diagnosis not present

## 2015-11-11 DIAGNOSIS — I6523 Occlusion and stenosis of bilateral carotid arteries: Secondary | ICD-10-CM | POA: Diagnosis not present

## 2015-11-11 DIAGNOSIS — M25511 Pain in right shoulder: Secondary | ICD-10-CM | POA: Diagnosis not present

## 2015-11-11 DIAGNOSIS — M6281 Muscle weakness (generalized): Secondary | ICD-10-CM | POA: Diagnosis not present

## 2015-11-11 DIAGNOSIS — K589 Irritable bowel syndrome without diarrhea: Secondary | ICD-10-CM

## 2015-11-11 DIAGNOSIS — M7581 Other shoulder lesions, right shoulder: Secondary | ICD-10-CM | POA: Diagnosis not present

## 2015-11-11 DIAGNOSIS — E785 Hyperlipidemia, unspecified: Secondary | ICD-10-CM | POA: Diagnosis not present

## 2015-11-11 DIAGNOSIS — Z7982 Long term (current) use of aspirin: Secondary | ICD-10-CM | POA: Diagnosis not present

## 2015-11-11 DIAGNOSIS — M25611 Stiffness of right shoulder, not elsewhere classified: Secondary | ICD-10-CM | POA: Diagnosis not present

## 2015-11-11 NOTE — Progress Notes (Signed)
Patient is here for new evaluation, he has been diagnosed in the past with Prostate cancer in 2004. He is now noticing frequency of urination now.

## 2015-11-11 NOTE — Progress Notes (Signed)
[facility] CONSULT NOTE  Patient Care Team: [name], MD as PCP - General (Family Medicine) Rondel Oh, MD as Consulting Physician (Ophthalmology)  CHIEF COMPLAINTS/PURPOSE OF CONSULTATION:  Oncology History   # external beam radiation therapy and radiation seed implant in 2005 with Dr. Joelyn Oms and Dr. Danny Lawless for T1c Gleason 3+4 = 7 prostate cancer with a pretreatment PSA of 6.8. In November 2015, he was found to have biochemical recurrence.  Dec 2016- CT a/p & Bone scan- NEG; PSA- 11; June 2017- 19; STARTED on Firmagon     Primary adenocarcinoma of prostate [facility])    Initial Diagnosis Primary adenocarcinoma of prostate ([place])     HISTORY OF PRESENTING ILLNESS:  Thomas Lopez 82 y.o.  male very pleasant patient with above history of prostate cancer treated with definitive radiation in 2005 noted to have recurrence Biochemical few years after his radiation. However in December 2016 PSA was around 75; in June 2016 PSA was 19. CT of the abdomen and pelvis and bone scan in December 2016 was negative. Given the rapid doubling time patient has been referred to medical oncology for further evaluation and recommendations. He received 1 shot of [place] in June 19.  Patient's appetite is good. He is not losing any weight. Denies any difficulty in urination. No significant hot flashes.   ROS: A complete 10 point review of system is done which is negative except mentioned above in history of present illness  MEDICAL HISTORY:  Past Medical History  Diagnosis Date  . HTN (hypertension)   . HLD (hyperlipidemia)   . Hyperglycemia   . Esophagitis   . ED (erectile dysfunction)     Trimix and VED  . IBS (irritable bowel syndrome)   . Colon polyps     rpt colonoscopy due 2014  . Glaucoma   . Prostate cancer [facility]) 2005    s/p seed implant, EBRT ([facility]. Alinda [facility] at [facility]) recurrent treating with androgen deprivation referred to cancer center  . Smoking history   .  GERD (gastroesophageal reflux disease)   . Carotid stenosis 11/2014    mild bilateral 1-39%, rpt 2 yrs  . Essential hypertension 06/29/2009  . Aorto-iliac atherosclerosis ([place]) 04/2015    by CT scan    SURGICAL HISTORY: Past Surgical History  Procedure Laterality Date  . Wrist release  6/98; 9/99    Right  . Ncs/wrists left carpal  2/02    Min./right improved  . Esophagogastroduodenoscopy  03/01/01    H.H.; gastritis esoph. dudodenitis  . Prostate biopsy  12/04    positive; radioactive seed implant (Dr. Joelyn Oms)  . Colonoscopy  03/01/01    Multiple polyps, divertic//adenomatous polyps  . Colonoscopy  04/24/01    multiple polyps  . Colonoscopy  10/23/01    Polyps benign-repeat every 2 years  . Colonoscopy  06/23/04    Adenom/hyperplastic polyps; multiple external hemorrhoids  . Colonoscopy  06/08/07    single small polyp-repeat 5 years  . Prostate ext beam radiation  1/27-07/23/03    Dr. Danny Lawless  . Spirometry  09/2011    WNL  . Pfts  10/2012    FVC 64%, FEV1 41%, ratio 0.62  . Cataract extraction, bilateral  December 2016    [place], Dr. Raynelle Fanning    SOCIAL HISTORY: Social History   Social History  . Marital Status: Married    Spouse Name: N/A  . Number of Children: 5  . Years of Education: N/A   Occupational History  .  Retired    Social History Main Topics  . Smoking status: Former Smoker    Quit date: 05/23/1993  . Smokeless tobacco: Never Used  . Alcohol Use: Yes     Comment: Rare  . Drug Use: No  . Sexual Activity: Yes   Other Topics Concern  . 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 Cancer Mother     colon  . Hyperlipidemia Mother   . Other Mother     Carotid disease  . Coronary artery  disease Neg Hx   . Stroke Neg Hx     ALLERGIES:  has No Known Allergies.  MEDICATIONS:  Current Outpatient Prescriptions  Medication Sig Dispense Refill  . aspirin EC 81 MG tablet Take 81 mg by mouth daily.    . brimonidine (ALPHAGAN P) 0.1 % SOLN Apply 1 drop to eye at bedtime.    . dorzolamide-timolol (COSOPT) 22.3-6.8 MG/ML ophthalmic solution As directed    . latanoprost (XALATAN) 0.005 % ophthalmic solution Place 1 drop into both eyes at bedtime.    Marland Kitchen lisinopril (PRINIVIL,ZESTRIL) 10 MG tablet Take 10 mg by mouth daily.    . methylcellulose packet Take 1 each by mouth as needed. Reported on 11/05/2015    . omeprazole (PRILOSEC) 40 MG capsule take 1 capsule by mouth once daily 90 capsule 3  . rosuvastatin (CRESTOR) 10 MG tablet Take 1 tablet (10 mg total) by mouth daily. 90 tablet 3   No current facility-administered medications for this visit.      Marland Kitchen  PHYSICAL EXAMINATION: ECOG PERFORMANCE STATUS: 0 - Asymptomatic  Filed Vitals:   11/11/15 1120  BP: 125/73  Pulse: 54  Temp: 96.1 F (35.6 C)  Resp: 18   Filed Weights   11/11/15 1120  Weight: 157 lb 1.2 oz (71.25 kg)    GENERAL: Well-nourished well-developed; Alert, no distress and comfortable.   Alone.  EYES: no pallor or icterus OROPHARYNX: no thrush or ulceration; good dentition  NECK: supple, no masses felt LYMPH:  no palpable lymphadenopathy in the cervical, axillary or inguinal regions LUNGS: clear to auscultation and  No wheeze or crackles HEART/CVS: regular rate & rhythm and no murmurs; No lower extremity edema ABDOMEN: abdomen soft, non-tender and normal bowel sounds Musculoskeletal:no cyanosis of digits and no clubbing  PSYCH: alert & oriented x 3 with fluent speech NEURO: no focal motor/sensory deficits SKIN:  no rashes or significant lesions  LABORATORY DATA:  I have reviewed the data as listed Lab Results  Component Value Date   WBC 6.8 08/14/2015   HGB 12.9* 08/14/2015   HCT 39.8 08/14/2015    MCV 85.1 08/14/2015   PLT 212.0 08/14/2015    Recent Labs  04/24/15 1006 08/14/15 0805 10/08/15 0727  NA  --  140  --   K  --  4.0  --   CL  --  105  --   CO2  --  30  --   GLUCOSE  --  102*  --   BUN  --  15  --   CREATININE 1.00 0.85 1.10  CALCIUM  --  9.7  --     RADIOGRAPHIC STUDIES: I have personally reviewed the radiological images as listed and agreed  with the findings in the report. No results found.  ASSESSMENT & PLAN:   Primary adenocarcinoma of prostate ([place]) Hormone sensitive- biochemical recurrence [M0]- no evidence of metastatic disease on the CT abdomen pelvis and bone scan December 2016. Given the rapid doubling time of the PSA; I agree with urology regarding starting the patient on androgen deprivation therapy. Patient is currently on Firmagon; which after 3 months could potentially switch over to Lupron every 3 months.   # I discussed the goal of care is to control the PSA; spread of the disease. He understands at some point of time the disease will progress and he might need further treatments.   #The potential side effects of hot flashes; osteoporosis was discussed.   # Patient will follow-up with me in approximately 3 weeks or so/ labs.   # The above plan of care was discussed with patient in detail. He agrees.    Thank you Dr.Budzyn for allowing me to participate in the care of your pleasant patient. Please do not hesitate to contact me with questions or concerns in the interim.     Cammie Sickle, MD 11/11/2015 11:56 AM

## 2015-11-11 NOTE — Assessment & Plan Note (Addendum)
Hormone sensitive- biochemical recurrence [M0]- no evidence of metastatic disease on the CT abdomen pelvis and bone scan December 2016. Given the rapid doubling time of the PSA; I agree with urology regarding starting the patient on androgen deprivation therapy. We will switch the patient to Lupron every 3 months starting in approximately 3 weeks.  # I discussed the goal of care is to control the PSA; spread of the disease. He understands at some point of time the disease will progress and he might need further treatments.   #The potential side effects of hot flashes; osteoporosis was discussed.   # Patient will follow-up with me in approximately 3 weeks or so/ labs.   # The above plan of care was discussed with patient in detail. He agrees.

## 2015-11-16 DIAGNOSIS — M25611 Stiffness of right shoulder, not elsewhere classified: Secondary | ICD-10-CM | POA: Diagnosis not present

## 2015-11-16 DIAGNOSIS — M6281 Muscle weakness (generalized): Secondary | ICD-10-CM | POA: Diagnosis not present

## 2015-11-16 DIAGNOSIS — M25511 Pain in right shoulder: Secondary | ICD-10-CM | POA: Diagnosis not present

## 2015-11-18 DIAGNOSIS — M6281 Muscle weakness (generalized): Secondary | ICD-10-CM | POA: Diagnosis not present

## 2015-11-18 DIAGNOSIS — M25511 Pain in right shoulder: Secondary | ICD-10-CM | POA: Diagnosis not present

## 2015-11-18 DIAGNOSIS — M25611 Stiffness of right shoulder, not elsewhere classified: Secondary | ICD-10-CM | POA: Diagnosis not present

## 2015-11-23 DIAGNOSIS — M25611 Stiffness of right shoulder, not elsewhere classified: Secondary | ICD-10-CM | POA: Diagnosis not present

## 2015-11-23 DIAGNOSIS — M25511 Pain in right shoulder: Secondary | ICD-10-CM | POA: Diagnosis not present

## 2015-11-23 DIAGNOSIS — M6281 Muscle weakness (generalized): Secondary | ICD-10-CM | POA: Diagnosis not present

## 2015-11-25 DIAGNOSIS — M25611 Stiffness of right shoulder, not elsewhere classified: Secondary | ICD-10-CM | POA: Diagnosis not present

## 2015-11-25 DIAGNOSIS — M25511 Pain in right shoulder: Secondary | ICD-10-CM | POA: Diagnosis not present

## 2015-11-25 DIAGNOSIS — M6281 Muscle weakness (generalized): Secondary | ICD-10-CM | POA: Diagnosis not present

## 2015-11-30 DIAGNOSIS — M25511 Pain in right shoulder: Secondary | ICD-10-CM | POA: Diagnosis not present

## 2015-11-30 DIAGNOSIS — M6281 Muscle weakness (generalized): Secondary | ICD-10-CM | POA: Diagnosis not present

## 2015-11-30 DIAGNOSIS — M7581 Other shoulder lesions, right shoulder: Secondary | ICD-10-CM | POA: Diagnosis not present

## 2015-11-30 DIAGNOSIS — M25611 Stiffness of right shoulder, not elsewhere classified: Secondary | ICD-10-CM | POA: Diagnosis not present

## 2015-12-02 DIAGNOSIS — M25511 Pain in right shoulder: Secondary | ICD-10-CM | POA: Diagnosis not present

## 2015-12-02 DIAGNOSIS — M7581 Other shoulder lesions, right shoulder: Secondary | ICD-10-CM | POA: Diagnosis not present

## 2015-12-02 DIAGNOSIS — M25611 Stiffness of right shoulder, not elsewhere classified: Secondary | ICD-10-CM | POA: Diagnosis not present

## 2015-12-02 DIAGNOSIS — M6281 Muscle weakness (generalized): Secondary | ICD-10-CM | POA: Diagnosis not present

## 2015-12-03 ENCOUNTER — Inpatient Hospital Stay: Payer: Medicare Other

## 2015-12-03 ENCOUNTER — Inpatient Hospital Stay: Payer: Medicare Other | Attending: Internal Medicine | Admitting: Internal Medicine

## 2015-12-03 VITALS — BP 139/67 | HR 66 | Temp 95.5°F | Resp 18 | Wt 153.9 lb

## 2015-12-03 DIAGNOSIS — Z86010 Personal history of colonic polyps: Secondary | ICD-10-CM | POA: Diagnosis not present

## 2015-12-03 DIAGNOSIS — Z79899 Other long term (current) drug therapy: Secondary | ICD-10-CM | POA: Diagnosis not present

## 2015-12-03 DIAGNOSIS — E785 Hyperlipidemia, unspecified: Secondary | ICD-10-CM | POA: Insufficient documentation

## 2015-12-03 DIAGNOSIS — I6529 Occlusion and stenosis of unspecified carotid artery: Secondary | ICD-10-CM | POA: Insufficient documentation

## 2015-12-03 DIAGNOSIS — R739 Hyperglycemia, unspecified: Secondary | ICD-10-CM | POA: Insufficient documentation

## 2015-12-03 DIAGNOSIS — Z800 Family history of malignant neoplasm of digestive organs: Secondary | ICD-10-CM | POA: Diagnosis not present

## 2015-12-03 DIAGNOSIS — Z79818 Long term (current) use of other agents affecting estrogen receptors and estrogen levels: Secondary | ICD-10-CM

## 2015-12-03 DIAGNOSIS — Z7982 Long term (current) use of aspirin: Secondary | ICD-10-CM | POA: Insufficient documentation

## 2015-12-03 DIAGNOSIS — I2510 Atherosclerotic heart disease of native coronary artery without angina pectoris: Secondary | ICD-10-CM

## 2015-12-03 DIAGNOSIS — C61 Malignant neoplasm of prostate: Secondary | ICD-10-CM

## 2015-12-03 DIAGNOSIS — K589 Irritable bowel syndrome without diarrhea: Secondary | ICD-10-CM | POA: Diagnosis not present

## 2015-12-03 DIAGNOSIS — N529 Male erectile dysfunction, unspecified: Secondary | ICD-10-CM | POA: Insufficient documentation

## 2015-12-03 DIAGNOSIS — I10 Essential (primary) hypertension: Secondary | ICD-10-CM

## 2015-12-03 DIAGNOSIS — Z87891 Personal history of nicotine dependence: Secondary | ICD-10-CM | POA: Insufficient documentation

## 2015-12-03 DIAGNOSIS — K219 Gastro-esophageal reflux disease without esophagitis: Secondary | ICD-10-CM | POA: Diagnosis not present

## 2015-12-03 LAB — CBC WITH DIFFERENTIAL/PLATELET
Basophils Absolute: 0 K/uL (ref 0–0.1)
Basophils Relative: 0 %
Eosinophils Absolute: 0.1 K/uL (ref 0–0.7)
Eosinophils Relative: 1 %
HCT: 41.4 % (ref 40.0–52.0)
Hemoglobin: 13.8 g/dL (ref 13.0–18.0)
Lymphocytes Relative: 13 %
Lymphs Abs: 1.2 K/uL (ref 1.0–3.6)
MCH: 28.1 pg (ref 26.0–34.0)
MCHC: 33.4 g/dL (ref 32.0–36.0)
MCV: 84.2 fL (ref 80.0–100.0)
Monocytes Absolute: 0.5 K/uL (ref 0.2–1.0)
Monocytes Relative: 6 %
Neutro Abs: 7 K/uL — ABNORMAL HIGH (ref 1.4–6.5)
Neutrophils Relative %: 80 %
Platelets: 191 K/uL (ref 150–440)
RBC: 4.91 MIL/uL (ref 4.40–5.90)
RDW: 14.4 % (ref 11.5–14.5)
WBC: 8.8 K/uL (ref 3.8–10.6)

## 2015-12-03 LAB — COMPREHENSIVE METABOLIC PANEL WITH GFR
ALT: 20 U/L (ref 17–63)
AST: 24 U/L (ref 15–41)
Albumin: 4.2 g/dL (ref 3.5–5.0)
Alkaline Phosphatase: 63 U/L (ref 38–126)
Anion gap: 7 (ref 5–15)
BUN: 39 mg/dL — ABNORMAL HIGH (ref 6–20)
CO2: 25 mmol/L (ref 22–32)
Calcium: 9.4 mg/dL (ref 8.9–10.3)
Chloride: 103 mmol/L (ref 101–111)
Creatinine, Ser: 0.86 mg/dL (ref 0.61–1.24)
GFR calc Af Amer: >60 mL/min
GFR calc non Af Amer: >60 mL/min
Glucose, Bld: 130 mg/dL — ABNORMAL HIGH (ref 65–99)
Potassium: 3.8 mmol/L (ref 3.5–5.1)
Sodium: 135 mmol/L (ref 135–145)
Total Bilirubin: 0.6 mg/dL (ref 0.3–1.2)
Total Protein: 8.3 g/dL — ABNORMAL HIGH (ref 6.5–8.1)

## 2015-12-03 LAB — PSA: PSA: 2.4 ng/mL (ref 0.00–4.00)

## 2015-12-03 MED ORDER — LEUPROLIDE ACETATE (3 MONTH) 22.5 MG IM KIT
22.5000 mg | PACK | Freq: Once | INTRAMUSCULAR | Status: AC
  Administered 2015-12-03: 22.5 mg via INTRAMUSCULAR
  Filled 2015-12-03: qty 22.5

## 2015-12-03 NOTE — Progress Notes (Signed)
[facility] CONSULT NOTE  Patient Care Team: [name], MD as PCP - General (Family Medicine) Rondel Oh, MD as Consulting Physician (Ophthalmology)  CHIEF COMPLAINTS/PURPOSE OF CONSULTATION:  Oncology History   # external beam radiation therapy and radiation seed implant in 2005 with Dr. Joelyn Oms and Dr. Danny Lawless for T1c Gleason 3+4 = 7 prostate cancer with a pretreatment PSA of 6.8. In November 2015, he was found to have biochemical recurrence.  Dec 2016- CT a/p & Bone scan- NEG; PSA- 11; June 2017- 19; STARTED on Firmagon [Dr.Budzyn]     Primary adenocarcinoma of prostate [facility])    Initial Diagnosis Primary adenocarcinoma of prostate ([place])     HISTORY OF PRESENTING ILLNESS:  Thomas Lopez 82 y.o.  male very pleasant patient with above history of prostate cancer - Currently with recurrent prostate cancer/biochemical recurrence- hormone sensitive on ADT is here for follow-up   Patient's appetite is good. He is not losing any weight. Denies any difficulty in urination. No significant hot flashes. No chest pain or shortness of breath or cough.  ROS: A complete 10 point review of system is done which is negative except mentioned above in history of present illness  MEDICAL HISTORY:  Past Medical History  Diagnosis Date  . HTN (hypertension)   . HLD (hyperlipidemia)   . Hyperglycemia   . Esophagitis   . ED (erectile dysfunction)     Trimix and VED  . IBS (irritable bowel syndrome)   . Colon polyps     rpt colonoscopy due 2014  . Glaucoma   . Prostate cancer [facility]) 2005    s/p seed implant, EBRT (Dr. Alinda Money at [facility]) recurrent treating with androgen deprivation referred to cancer center  . Smoking history   . GERD (gastroesophageal reflux disease)   . Carotid stenosis 11/2014    mild bilateral 1-39%, rpt 2 yrs  . Essential hypertension 06/29/2009  . Aorto-iliac atherosclerosis ([place]) 04/2015    by CT scan    SURGICAL HISTORY: Past Surgical  History  Procedure Laterality Date  . Wrist release  6/98; 9/99    Right  . Ncs/wrists left carpal  2/02    Min./right improved  . Esophagogastroduodenoscopy  03/01/01    H.H.; gastritis esoph. dudodenitis  . Prostate biopsy  12/04    positive; radioactive seed implant (Dr. Joelyn Oms)  . Colonoscopy  03/01/01    Multiple polyps, divertic//adenomatous polyps  . Colonoscopy  04/24/01    multiple polyps  . Colonoscopy  10/23/01    Polyps benign-repeat every 2 years  . Colonoscopy  06/23/04    Adenom/hyperplastic polyps; multiple external hemorrhoids  . Colonoscopy  06/08/07    single small polyp-repeat 5 years  . Prostate ext beam radiation  1/27-07/23/03    Dr. Danny Lawless  . Spirometry  09/2011    WNL  . Pfts  10/2012    FVC 64%, FEV1 41%, ratio 0.62  . Cataract extraction, bilateral  December 2016    [place], Dr. Raynelle Fanning    SOCIAL HISTORY: Social History   Social History  . Marital Status: Married    Spouse Name: N/A  . Number of Children: 5  . Years of Education: N/A   Occupational History  . Retired    Social History Main Topics  . Smoking status: Former Smoker    Quit date: 05/23/1993  . Smokeless tobacco: Never Used  . Alcohol Use: Yes     Comment: Rare  . Drug Use: No  .  Sexual Activity: Yes   Other Topics Concern  . 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 Cancer Mother     colon  . Hyperlipidemia Mother   . Other Mother     Carotid disease  . Coronary artery disease Neg Hx   . Stroke Neg Hx     ALLERGIES:  has No Known Allergies.  MEDICATIONS:  Current Outpatient Prescriptions  Medication Sig Dispense Refill  . aspirin EC 81 MG tablet Take 81 mg by mouth daily.    . brimonidine  (ALPHAGAN P) 0.1 % SOLN Apply 1 drop to eye at bedtime.    . dorzolamide-timolol (COSOPT) 22.3-6.8 MG/ML ophthalmic solution As directed    . latanoprost (XALATAN) 0.005 % ophthalmic solution Place 1 drop into both eyes at bedtime.    Marland Kitchen lisinopril (PRINIVIL,ZESTRIL) 10 MG tablet Take 10 mg by mouth daily.    . methylcellulose packet Take 1 each by mouth as needed. Reported on 11/05/2015    . omeprazole (PRILOSEC) 40 MG capsule take 1 capsule by mouth once daily 90 capsule 3  . rosuvastatin (CRESTOR) 10 MG tablet Take 1 tablet (10 mg total) by mouth daily. 90 tablet 3   No current facility-administered medications for this visit.      Marland Kitchen  PHYSICAL EXAMINATION: ECOG PERFORMANCE STATUS: 0 - Asymptomatic  Filed Vitals:   12/03/15 1003  BP: 139/67  Pulse: 66  Temp: 95.5 F (35.3 C)  Resp: 18   Filed Weights   12/03/15 1003  Weight: 153 lb 14.1 oz (69.8 kg)    GENERAL: Well-nourished well-developed; Alert, no distress and comfortable.   Alone.  EYES: no pallor or icterus OROPHARYNX: no thrush or ulceration; good dentition  NECK: supple, no masses felt LYMPH:  no palpable lymphadenopathy in the cervical, axillary or inguinal regions LUNGS: clear to auscultation and  No wheeze or crackles HEART/CVS: regular rate & rhythm and no murmurs; No lower extremity edema ABDOMEN: abdomen soft, non-tender and normal bowel sounds Musculoskeletal:no cyanosis of digits and no clubbing  PSYCH: alert & oriented x 3 with fluent speech NEURO: no focal motor/sensory deficits SKIN:  no rashes or significant lesions  LABORATORY DATA:  I have reviewed the data as listed Lab Results  Component Value Date   WBC 8.8 12/03/2015   HGB 13.8 12/03/2015   HCT 41.4 12/03/2015   MCV 84.2 12/03/2015   PLT 191 12/03/2015    Recent Labs  08/14/15 0805 10/08/15 0727 12/03/15 0945  NA 140  --  135  K 4.0  --  3.8  CL 105  --  103  CO2 30  --  25  GLUCOSE 102*  --  130*  BUN 15  --  39*  CREATININE  0.85 1.10 0.86  CALCIUM 9.7  --  9.4  GFRNONAA  --   --  >60  GFRAA  --   --  >60  PROT  --   --  8.3*  ALBUMIN  --   --  4.2  AST  --   --  24  ALT  --   --  20  ALKPHOS  --   --  63  BILITOT  --   --  0.6  RADIOGRAPHIC STUDIES: I have personally reviewed the radiological images as listed and agreed with the findings in the report. No results found.  ASSESSMENT & PLAN:   Primary adenocarcinoma of prostate ([place]) Metastatic prostate cancer Hormone sensitive- biochemical recurrence [M0]- given the rapid doubling time of PSA patient on- ADT every 3 months. PSA from today is pending.   #The potential side effects of hot flashes; osteoporosis was discussed. We will get bone density next visit.  #Follow with me labs PSA Lupron shot in 3 months.       Cammie Sickle, MD 12/03/2015 9:23 PM

## 2015-12-03 NOTE — Assessment & Plan Note (Addendum)
Metastatic prostate cancer Hormone sensitive- biochemical recurrence [M0]- given the rapid doubling time of PSA patient on- ADT every 3 months. PSA from today is pending.   #The potential side effects of hot flashes; osteoporosis was discussed. We will get bone density next visit.  #Follow with me labs PSA Lupron shot in 3 months.

## 2015-12-07 DIAGNOSIS — H401133 Primary open-angle glaucoma, bilateral, severe stage: Secondary | ICD-10-CM | POA: Diagnosis not present

## 2015-12-07 DIAGNOSIS — M7581 Other shoulder lesions, right shoulder: Secondary | ICD-10-CM | POA: Diagnosis not present

## 2015-12-07 DIAGNOSIS — M25611 Stiffness of right shoulder, not elsewhere classified: Secondary | ICD-10-CM | POA: Diagnosis not present

## 2015-12-07 DIAGNOSIS — M6281 Muscle weakness (generalized): Secondary | ICD-10-CM | POA: Diagnosis not present

## 2015-12-07 DIAGNOSIS — M25511 Pain in right shoulder: Secondary | ICD-10-CM | POA: Diagnosis not present

## 2015-12-09 DIAGNOSIS — M25611 Stiffness of right shoulder, not elsewhere classified: Secondary | ICD-10-CM | POA: Diagnosis not present

## 2015-12-09 DIAGNOSIS — M7581 Other shoulder lesions, right shoulder: Secondary | ICD-10-CM | POA: Diagnosis not present

## 2015-12-09 DIAGNOSIS — M6281 Muscle weakness (generalized): Secondary | ICD-10-CM | POA: Diagnosis not present

## 2015-12-09 DIAGNOSIS — M25511 Pain in right shoulder: Secondary | ICD-10-CM | POA: Diagnosis not present

## 2015-12-16 DIAGNOSIS — M7581 Other shoulder lesions, right shoulder: Secondary | ICD-10-CM | POA: Diagnosis not present

## 2015-12-16 DIAGNOSIS — M75121 Complete rotator cuff tear or rupture of right shoulder, not specified as traumatic: Secondary | ICD-10-CM | POA: Diagnosis not present

## 2015-12-16 DIAGNOSIS — S4991XD Unspecified injury of right shoulder and upper arm, subsequent encounter: Secondary | ICD-10-CM | POA: Diagnosis not present

## 2015-12-17 DIAGNOSIS — M25511 Pain in right shoulder: Secondary | ICD-10-CM | POA: Diagnosis not present

## 2015-12-17 DIAGNOSIS — M7581 Other shoulder lesions, right shoulder: Secondary | ICD-10-CM | POA: Diagnosis not present

## 2015-12-17 DIAGNOSIS — M6281 Muscle weakness (generalized): Secondary | ICD-10-CM | POA: Diagnosis not present

## 2015-12-17 DIAGNOSIS — M25611 Stiffness of right shoulder, not elsewhere classified: Secondary | ICD-10-CM | POA: Diagnosis not present

## 2016-01-10 ENCOUNTER — Encounter: Payer: Self-pay | Admitting: Family Medicine

## 2016-01-11 DIAGNOSIS — H401133 Primary open-angle glaucoma, bilateral, severe stage: Secondary | ICD-10-CM | POA: Diagnosis not present

## 2016-02-12 ENCOUNTER — Encounter: Payer: Self-pay | Admitting: Family Medicine

## 2016-02-12 ENCOUNTER — Ambulatory Visit (INDEPENDENT_AMBULATORY_CARE_PROVIDER_SITE_OTHER): Payer: Medicare Other | Admitting: Family Medicine

## 2016-02-12 VITALS — BP 124/64 | HR 56 | Temp 97.4°F | Wt 154.5 lb

## 2016-02-12 DIAGNOSIS — C61 Malignant neoplasm of prostate: Secondary | ICD-10-CM | POA: Diagnosis not present

## 2016-02-12 DIAGNOSIS — Z23 Encounter for immunization: Secondary | ICD-10-CM | POA: Diagnosis not present

## 2016-02-12 DIAGNOSIS — R404 Transient alteration of awareness: Secondary | ICD-10-CM | POA: Diagnosis not present

## 2016-02-12 DIAGNOSIS — R232 Flushing: Secondary | ICD-10-CM

## 2016-02-12 DIAGNOSIS — R4182 Altered mental status, unspecified: Secondary | ICD-10-CM | POA: Insufficient documentation

## 2016-02-12 NOTE — Assessment & Plan Note (Signed)
Isolated episode over weekend after he rode merry-go-round. Anticipate dizziness related. nonfocal neurological exam. No syncope, not consistent with stroke or seizure. Pt will avoid carousel from here on out. To update me for further evaluation if recurrent episode.  EKG - NSR rate 60, normal axis, intervals, no acute ST/T changes, mild T wave flattening precordially, no old to compare.

## 2016-02-12 NOTE — Patient Instructions (Signed)
Flu shot today I think hot flashes are from lupron shot for prostate cancer. Touch base with Dr Rogue Bussing next visit. Watch for any new confusion episodes and let me know if this happens for further evaluation.

## 2016-02-12 NOTE — Progress Notes (Signed)
Pre visit review using our clinic review tool, if applicable. No additional management support is needed unless otherwise documented below in the visit note. 

## 2016-02-12 NOTE — Assessment & Plan Note (Signed)
Now on lupron ADT per oncology, appreciate their care.

## 2016-02-12 NOTE — Progress Notes (Signed)
BP 124/64   Pulse (!) 56   Temp 97.4 F (36.3 C) (Oral)   Wt 154 lb 8 oz (70.1 kg)   BMI 23.15 kg/m    CC: hot flashes Subjective:    Patient ID: Thomas Lopez, male    DOB: 1932/07/23, 82 y.o.   MRN: [id number]  HPI: Thomas Lopez is a 82 y.o. male presenting on 02/12/2016 for Hot Flashes   Endorses several month history of "hot flashes" described as a warm flushed feeling, diaphoresis then chills. He did have episode on Sunday of AMS at [place] and after this he was "in a daze", had to be helped off the ride and paramedic was called (with normal BP). No true syncope, lightheadedness, headache. May have been dizzy from merry-go-round. Episode lasted 5 minutes. No stroke symptoms - hemiparesis, numbness, slurred speech, bowel/bladder incontinence, tongue biting. No post-ictal confusion. Felt better when he sat down, rest of day was fine.   Prostate cancer s/p EBRT and seed implants 2005 with biological recurrence 2017 - referred to oncologist Dr Tish Men - started androgen deprivation therapy with firmagon and now lupron androgen deprivation therapy.   Recent complete R RTC tear and biceps tendon rupture - followed by Dr Roland Rack. Treated conservatively with PT until he stopped. Released from care. Able to bowl.   Relevant past medical, surgical, family and social history reviewed and updated as indicated. Interim medical history since our last visit reviewed. Allergies and medications reviewed and updated. Current Outpatient Prescriptions on File Prior to Visit  Medication Sig  . aspirin EC 81 MG tablet Take 81 mg by mouth daily.  . brimonidine (ALPHAGAN P) 0.1 % SOLN Apply 1 drop to eye at bedtime.  . dorzolamide-timolol (COSOPT) 22.3-6.8 MG/ML ophthalmic solution As directed  . latanoprost (XALATAN) 0.005 % ophthalmic solution Place 1 drop into both eyes at bedtime.  Marland Kitchen lisinopril (PRINIVIL,ZESTRIL) 10 MG tablet Take 10 mg by mouth daily.  . methylcellulose packet  Take 1 each by mouth as needed. Reported on 11/05/2015  . omeprazole (PRILOSEC) 40 MG capsule take 1 capsule by mouth once daily  . rosuvastatin (CRESTOR) 10 MG tablet Take 1 tablet (10 mg total) by mouth daily.   No current facility-administered medications on file prior to visit.     Review of Systems Per HPI unless specifically indicated in ROS section     Objective:    BP 124/64   Pulse (!) 56   Temp 97.4 F (36.3 C) (Oral)   Wt 154 lb 8 oz (70.1 kg)   BMI 23.15 kg/m   Wt Readings from Last 3 Encounters:  02/12/16 154 lb 8 oz (70.1 kg)  12/03/15 153 lb 14.1 oz (69.8 kg)  11/11/15 157 lb 1.2 oz (71.2 kg)    Physical Exam  Constitutional: He appears well-developed and well-nourished. No distress.  HENT:  Mouth/Throat: Oropharynx is clear and moist. No oropharyngeal exudate.  Eyes: Conjunctivae and EOM are normal. Pupils are equal, round, and reactive to light.  Neck: Normal range of motion. Neck supple. Carotid bruit is not present. No thyromegaly present.  Cardiovascular: Normal rate, regular rhythm, normal heart sounds and intact distal pulses.   No murmur heard. Pulmonary/Chest: Effort normal and breath sounds normal. No respiratory distress. He has no wheezes. He has no rales.  Musculoskeletal: He exhibits no edema.  Lymphadenopathy:    He has no cervical adenopathy.  Neurological: He has normal strength. No cranial nerve deficit or sensory deficit. He displays a  negative Romberg sign. Coordination and gait normal.  CN 2-12 intact FTN intact  Skin: Skin is warm and dry. No rash noted.  Psychiatric: He has a normal mood and affect.  Nursing note and vitals reviewed.  Results for orders placed or performed in visit on 12/03/15  Comprehensive metabolic panel  Result Value Ref Range   Sodium 135 135 - 145 mmol/L   Potassium 3.8 3.5 - 5.1 mmol/L   Chloride 103 101 - 111 mmol/L   CO2 25 22 - 32 mmol/L   Glucose, Bld 130 (H) 65 - 99 mg/dL   BUN 39 (H) 6 - 20 mg/dL    Creatinine, Ser 0.86 0.61 - 1.24 mg/dL   Calcium 9.4 8.9 - 10.3 mg/dL   Total Protein 8.3 (H) 6.5 - 8.1 g/dL   Albumin 4.2 3.5 - 5.0 g/dL   AST 24 15 - 41 U/L   ALT 20 17 - 63 U/L   Alkaline Phosphatase 63 38 - 126 U/L   Total Bilirubin 0.6 0.3 - 1.2 mg/dL   GFR calc non Af Amer >60 >60 mL/min   GFR calc Af Amer >60 >60 mL/min   Anion gap 7 5 - 15  CBC with Differential  Result Value Ref Range   WBC 8.8 3.8 - 10.6 K/uL   RBC 4.91 4.40 - 5.90 MIL/uL   Hemoglobin 13.8 13.0 - 18.0 g/dL   HCT 41.4 40.0 - 52.0 %   MCV 84.2 80.0 - 100.0 fL   MCH 28.1 26.0 - 34.0 pg   MCHC 33.4 32.0 - 36.0 g/dL   RDW 14.4 11.5 - 14.5 %   Platelets 191 150 - 440 K/uL   Neutrophils Relative % 80 %   Neutro Abs 7.0 (H) 1.4 - 6.5 K/uL   Lymphocytes Relative 13 %   Lymphs Abs 1.2 1.0 - 3.6 K/uL   Monocytes Relative 6 %   Monocytes Absolute 0.5 0.2 - 1.0 K/uL   Eosinophils Relative 1 %   Eosinophils Absolute 0.1 0 - 0.7 K/uL   Basophils Relative 0 %   Basophils Absolute 0.0 0 - 0.1 K/uL  PSA  Result Value Ref Range   PSA 2.40 0.00 - 4.00 ng/mL      Assessment & Plan:   Problem List Items Addressed This Visit    Altered mental status    Isolated episode over weekend after he rode merry-go-round. Anticipate dizziness related. nonfocal neurological exam. No syncope, not consistent with stroke or seizure. Pt will avoid carousel from here on out. To update me for further evaluation if recurrent episode.  EKG - NSR rate 60, normal axis, intervals, no acute ST/T changes, mild T wave flattening precordially, no old to compare.       Relevant Orders   EKG 12-Lead (Completed)   Hot flashes - Primary    Over last few months, correlates to commencement of lupron. Discussed likely etiology.       Primary adenocarcinoma of prostate [facility])    Now on lupron ADT per oncology, appreciate their care.       Other Visit Diagnoses    Need for influenza vaccination       Relevant Orders   Flu Vaccine QUAD 36+  mos PF IM (Fluarix & Fluzone Quad PF)       Follow up plan: Return if symptoms worsen or fail to improve.  [name], MD

## 2016-02-12 NOTE — Assessment & Plan Note (Signed)
Over last few months, correlates to commencement of lupron. Discussed likely etiology.

## 2016-03-03 ENCOUNTER — Inpatient Hospital Stay: Payer: Medicare Other

## 2016-03-03 ENCOUNTER — Inpatient Hospital Stay (HOSPITAL_BASED_OUTPATIENT_CLINIC_OR_DEPARTMENT_OTHER): Payer: Medicare Other | Admitting: Internal Medicine

## 2016-03-03 ENCOUNTER — Inpatient Hospital Stay: Payer: Medicare Other | Attending: Internal Medicine

## 2016-03-03 VITALS — BP 125/70 | HR 64 | Temp 97.5°F | Resp 20 | Ht 68.5 in | Wt 154.4 lb

## 2016-03-03 DIAGNOSIS — R232 Flushing: Secondary | ICD-10-CM | POA: Insufficient documentation

## 2016-03-03 DIAGNOSIS — Z87891 Personal history of nicotine dependence: Secondary | ICD-10-CM

## 2016-03-03 DIAGNOSIS — M810 Age-related osteoporosis without current pathological fracture: Secondary | ICD-10-CM

## 2016-03-03 DIAGNOSIS — K219 Gastro-esophageal reflux disease without esophagitis: Secondary | ICD-10-CM

## 2016-03-03 DIAGNOSIS — D649 Anemia, unspecified: Secondary | ICD-10-CM | POA: Diagnosis not present

## 2016-03-03 DIAGNOSIS — Z8546 Personal history of malignant neoplasm of prostate: Secondary | ICD-10-CM | POA: Insufficient documentation

## 2016-03-03 DIAGNOSIS — Z923 Personal history of irradiation: Secondary | ICD-10-CM

## 2016-03-03 DIAGNOSIS — Z79818 Long term (current) use of other agents affecting estrogen receptors and estrogen levels: Secondary | ICD-10-CM | POA: Diagnosis not present

## 2016-03-03 DIAGNOSIS — I10 Essential (primary) hypertension: Secondary | ICD-10-CM | POA: Diagnosis not present

## 2016-03-03 DIAGNOSIS — C61 Malignant neoplasm of prostate: Secondary | ICD-10-CM | POA: Diagnosis not present

## 2016-03-03 DIAGNOSIS — K589 Irritable bowel syndrome without diarrhea: Secondary | ICD-10-CM | POA: Insufficient documentation

## 2016-03-03 DIAGNOSIS — I2510 Atherosclerotic heart disease of native coronary artery without angina pectoris: Secondary | ICD-10-CM | POA: Insufficient documentation

## 2016-03-03 DIAGNOSIS — Z79899 Other long term (current) drug therapy: Secondary | ICD-10-CM | POA: Diagnosis not present

## 2016-03-03 DIAGNOSIS — Z7982 Long term (current) use of aspirin: Secondary | ICD-10-CM

## 2016-03-03 DIAGNOSIS — E785 Hyperlipidemia, unspecified: Secondary | ICD-10-CM | POA: Insufficient documentation

## 2016-03-03 LAB — COMPREHENSIVE METABOLIC PANEL
ALBUMIN: 4.3 g/dL (ref 3.5–5.0)
ALK PHOS: 58 U/L (ref 38–126)
ALT: 19 U/L (ref 17–63)
ANION GAP: 9 (ref 5–15)
AST: 21 U/L (ref 15–41)
BUN: 43 mg/dL — AB (ref 6–20)
CALCIUM: 9.6 mg/dL (ref 8.9–10.3)
CO2: 25 mmol/L (ref 22–32)
Chloride: 104 mmol/L (ref 101–111)
Creatinine, Ser: 1.14 mg/dL (ref 0.61–1.24)
GFR calc Af Amer: >60 mL/min (ref >60)
GFR calc non Af Amer: 58 mL/min — ABNORMAL LOW (ref >60)
GLUCOSE: 125 mg/dL — AB (ref 65–99)
POTASSIUM: 4.2 mmol/L (ref 3.5–5.1)
SODIUM: 138 mmol/L (ref 135–145)
Total Bilirubin: 0.7 mg/dL (ref 0.3–1.2)
Total Protein: 7.7 g/dL (ref 6.5–8.1)

## 2016-03-03 LAB — CBC WITH DIFFERENTIAL/PLATELET
Basophils Absolute: 0 10*3/uL (ref 0–0.1)
Basophils Relative: 0 %
Eosinophils Absolute: 0.1 10*3/uL (ref 0–0.7)
Eosinophils Relative: 1 %
HEMATOCRIT: 36.1 % — AB (ref 40.0–52.0)
Hemoglobin: 11.8 g/dL — ABNORMAL LOW (ref 13.0–18.0)
LYMPHS PCT: 19 %
Lymphs Abs: 1.4 10*3/uL (ref 1.0–3.6)
MCH: 28.3 pg (ref 26.0–34.0)
MCHC: 32.8 g/dL (ref 32.0–36.0)
MCV: 86.3 fL (ref 80.0–100.0)
MONO ABS: 0.5 10*3/uL (ref 0.2–1.0)
MONOS PCT: 6 %
NEUTROS ABS: 5.5 10*3/uL (ref 1.4–6.5)
Neutrophils Relative %: 74 %
Platelets: 204 10*3/uL (ref 150–440)
RBC: 4.18 MIL/uL — ABNORMAL LOW (ref 4.40–5.90)
RDW: 15.7 % — AB (ref 11.5–14.5)
WBC: 7.6 10*3/uL (ref 3.8–10.6)

## 2016-03-03 LAB — IRON AND TIBC
IRON: 82 ug/dL (ref 45–182)
Saturation Ratios: 23 % (ref 17.9–39.5)
TIBC: 362 ug/dL (ref 250–450)
UIBC: 280 ug/dL

## 2016-03-03 LAB — PSA: PSA: 0.77 ng/mL (ref 0.00–4.00)

## 2016-03-03 LAB — FERRITIN: FERRITIN: 240 ng/mL (ref 24–336)

## 2016-03-03 MED ORDER — LEUPROLIDE ACETATE (3 MONTH) 22.5 MG IM KIT
22.5000 mg | PACK | Freq: Once | INTRAMUSCULAR | Status: AC
  Administered 2016-03-03: 22.5 mg via INTRAMUSCULAR
  Filled 2016-03-03: qty 22.5

## 2016-03-03 MED ORDER — VENLAFAXINE HCL ER 37.5 MG PO CP24: 37.5000 mg | ORAL_CAPSULE | Freq: Every day | ORAL | 3 refills | Status: DC

## 2016-03-03 NOTE — Progress Notes (Signed)
[place] CONSULT NOTE  Patient Care Team: [name], MD as PCP - General (Family Medicine) Rondel Oh, MD as Consulting Physician (Ophthalmology)  CHIEF COMPLAINTS/PURPOSE OF CONSULTATION:  Oncology History   # external beam radiation therapy and radiation seed implant in 2005 with Dr. Joelyn Oms and Dr. Danny Lawless for T1c Gleason 3+4 = 7 prostate cancer with a pretreatment PSA of 6.8. In November 2015, he was found to have biochemical recurrence.  Dec 2016- CT a/p & Bone scan- NEG; PSA- 11; June 2017- 19; STARTED on Firmagon [Dr.Budzyn]     Primary adenocarcinoma of prostate [facility])    Initial Diagnosis    Primary adenocarcinoma of prostate ([place])        HISTORY OF PRESENTING ILLNESS:  Thomas Lopez 82 y.o.  male very pleasant patient with above history of prostate cancer - Currently with recurrent prostate cancer/biochemical recurrence- hormone sensitive on ADT is here for follow-up.  Patient complains of hot flashes; otherwise denies any bone pain. Patient's appetite is good. He is not losing any weight. Denies any difficulty in urination. No chest pain or shortness of breath or cough.  ROS: A complete 10 point review of system is done which is negative except mentioned above in history of present illness  MEDICAL HISTORY:  Past Medical History:  Diagnosis Date  . Aorto-iliac atherosclerosis ([place]) 04/2015   by CT scan  . Carotid stenosis 11/2014   mild bilateral 1-39%, rpt 2 yrs  . Colon polyps    rpt colonoscopy due 2014  . ED (erectile dysfunction)    Trimix and VED  . Esophagitis   . Essential hypertension 06/29/2009  . GERD (gastroesophageal reflux disease)   . Glaucoma   . HLD (hyperlipidemia)   . HTN (hypertension)   . Hyperglycemia   . IBS (irritable bowel syndrome)   . Prostate cancer [facility]) 2005   s/p seed implant, EBRT (Dr. Alinda Money at [facility]) recurrent treating with androgen deprivation referred to cancer center  . Smoking history      SURGICAL HISTORY: Past Surgical History:  Procedure Laterality Date  . CATARACT EXTRACTION, BILATERAL  December 2016   [place], Dr. Raynelle Fanning  . COLONOSCOPY  03/01/01   Multiple polyps, divertic//adenomatous polyps  . COLONOSCOPY  04/24/01   multiple polyps  . COLONOSCOPY  10/23/01   Polyps benign-repeat every 2 years  . COLONOSCOPY  06/23/04   Adenom/hyperplastic polyps; multiple external hemorrhoids  . COLONOSCOPY  06/08/07   single small polyp-repeat 5 years  . ESOPHAGOGASTRODUODENOSCOPY  03/01/01   H.H.; gastritis esoph. dudodenitis  . NCS/Wrists Left Carpal  2/02   Min./right improved  . PFTs  10/2012   FVC 64%, FEV1 41%, ratio 0.62  . PROSTATE BIOPSY  12/04   positive; radioactive seed implant (Dr. Joelyn Oms)  . Prostate Ext Beam Radiation  1/27-07/23/03   Dr. Danny Lawless  . SPIROMETRY  09/2011   WNL  . Wrist Release  6/98; 9/99   Right    SOCIAL HISTORY: Social History   Social History  . Marital status: Married    Spouse name: N/A  . Number of children: 5  . Years of education: N/A   Occupational History  . Retired    Social History Main Topics  . Smoking status: Former Smoker    Quit date: 05/23/1993  . Smokeless tobacco: Never Used  . Alcohol use Yes     Comment: Rare  . Drug use: No  . Sexual activity: Yes   Other  Topics Concern  . 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 Cancer Mother     colon  . Hyperlipidemia Mother   . Other Mother     Carotid disease  . Coronary artery disease Neg Hx   . Stroke Neg Hx     ALLERGIES:  has No Known Allergies.  MEDICATIONS:  Current Outpatient Prescriptions  Medication Sig Dispense Refill  . aspirin EC 81 MG tablet Take 81 mg by mouth daily.     . brimonidine (ALPHAGAN P) 0.1 % SOLN Apply 1 drop to eye at bedtime.    . dorzolamide-timolol (COSOPT) 22.3-6.8 MG/ML ophthalmic solution As directed    . latanoprost (XALATAN) 0.005 % ophthalmic solution Place 1 drop into both eyes at bedtime.    Marland Kitchen lisinopril (PRINIVIL,ZESTRIL) 10 MG tablet Take 10 mg by mouth daily.    . methylcellulose packet Take 1 each by mouth as needed. Reported on 11/05/2015    . omeprazole (PRILOSEC) 40 MG capsule take 1 capsule by mouth once daily 90 capsule 3  . rosuvastatin (CRESTOR) 10 MG tablet Take 1 tablet (10 mg total) by mouth daily. 90 tablet 3  . venlafaxine XR (EFFEXOR-XR) 37.5 MG 24 hr capsule Take 1 capsule (37.5 mg total) by mouth daily with breakfast. 30 capsule 3   No current facility-administered medications for this visit.       Marland Kitchen  PHYSICAL EXAMINATION: ECOG PERFORMANCE STATUS: 0 - Asymptomatic  Vitals:   03/03/16 0957  BP: 125/70  Pulse: 64  Resp: 20  Temp: 97.5 F (36.4 C)   Filed Weights   03/03/16 0957  Weight: 154 lb 6.4 oz (70 kg)    GENERAL: Well-nourished well-developed; Alert, no distress and comfortable.   Alone.  EYES: no pallor or icterus OROPHARYNX: no thrush or ulceration; good dentition  NECK: supple, no masses felt LYMPH:  no palpable lymphadenopathy in the cervical, axillary or inguinal regions LUNGS: clear to auscultation and  No wheeze or crackles HEART/CVS: regular rate & rhythm and no murmurs; No lower extremity edema ABDOMEN: abdomen soft, non-tender and normal bowel sounds Musculoskeletal:no cyanosis of digits and no clubbing  PSYCH: alert & oriented x 3 with fluent speech NEURO: no focal motor/sensory deficits SKIN:  no rashes or significant lesions  LABORATORY DATA:  I have reviewed the data as listed Lab Results  Component Value Date   WBC 7.6 03/03/2016   HGB 11.8 (L) 03/03/2016   HCT 36.1 (L) 03/03/2016   MCV 86.3 03/03/2016   PLT 204 03/03/2016    Recent Labs  08/14/15 0805  10/08/15 0727 12/03/15 0945 03/03/16 0935  NA 140  --  135 138  K 4.0  --  3.8 4.2  CL 105  --  103 104  CO2 30  --  25 25  GLUCOSE 102*  --  130* 125*  BUN 15  --  39* 43*  CREATININE 0.85 1.10 0.86 1.14  CALCIUM 9.7  --  9.4 9.6  GFRNONAA  --   --  >60 58*  GFRAA  --   --  >60 >60  PROT  --   --  8.3* 7.7  ALBUMIN  --   --  4.2 4.3  AST  --   --  24 21  ALT  --   --  20 19  ALKPHOS  --   --  63 58  BILITOT  --   --  0.6 0.7    RADIOGRAPHIC STUDIES: I have personally reviewed the radiological images as listed and agreed with the findings in the report. No results found.  ASSESSMENT & PLAN:   Primary adenocarcinoma of prostate ([place]) Metastatic prostate cancer Hormone sensitive- biochemical recurrence [M0]- given the rapid doubling time of PSA patient on- ADT every 3 months. PSA  July 2.5.  # Hot flashes- discussed trial of effexor.   # Anemia- Hb 11- check Iron studies today.   #  Osteoporosis was discussed Ca+ vit D. BID; We will get bone density prior to next visit.  #Follow with me labs PSA Lupron shot in 3 months.      Cammie Sickle, MD 03/06/2016 12:04 PM

## 2016-03-03 NOTE — Assessment & Plan Note (Signed)
Metastatic prostate cancer Hormone sensitive- biochemical recurrence [M0]- given the rapid doubling time of PSA patient on- ADT every 3 months. PSA  July 2.5.  # Hot flashes- discussed trial of effexor.   # Anemia- Hb 11- check Iron studies today.   #  Osteoporosis was discussed Ca+ vit D. BID; We will get bone density prior to next visit.  #Follow with me labs PSA Lupron shot in 3 months.

## 2016-03-03 NOTE — Progress Notes (Signed)
Patient here for follow-up with Dr. Rogue Bussing. H/o prostate cancer.  He c/o of frequent episodes of hot flashes.  Denies any fever, chills, nausea, vomiting, change in bowel habits or boney pain.

## 2016-03-03 NOTE — Patient Instructions (Signed)
Advance Directive Advance directives are the legal documents that allow you to make choices about your health care and medical treatment if you cannot speak for yourself. Advance directives are a way for you to communicate your wishes to family, friends, and health care providers. The specified people can then convey your decisions about end-of-life care to avoid confusion if you should become unable to communicate. Ideally, the process of discussing and writing advance directives should happen over time rather than making decisions all at once. Advance directives can be modified as your situation changes, and you can change your mind at any time, even after you have signed the advance directives. Each state has its own laws regarding advance directives. You may want to check with your health care provider, attorney, or state representative about the law in your state. Below are some examples of advance directives. LIVING WILL A living will is a set of instructions documenting your wishes about medical care when you cannot care for yourself. It is used if you become:  Terminally ill.  Incapacitated.  Unable to communicate.  Unable to make decisions. Items to consider in your living will include:  The use or non-use of life-sustaining equipment, such as dialysis machines and breathing machines (ventilators).  A do not resuscitate (DNR) order, which is the instruction not to use cardiopulmonary resuscitation (CPR) if breathing or heartbeat stops.  Tube feeding.  Withholding of food and fluids.  Comfort (palliative) care when the goal becomes comfort rather than a cure.  Organ and tissue donation. A living will does not give instructions about distribution of your money and property if you should pass away. It is advisable to seek the expert advice of a lawyer in drawing up a will regarding your possessions. Decisions about taxes, beneficiaries, and asset distribution will be legally binding.  This process can relieve your family and friends of any burdens surrounding disputes or questions that may come up about the allocation of your assets. DO NOT RESUSCITATE (DNR) A do not resuscitate (DNR) order is a request to not have CPR in the event that your heart stops beating or you stop breathing. Unless given other instructions, a health care provider will try to help any patient whose heart has stopped or who has stopped breathing.  HEALTH CARE PROXY AND DURABLE POWER OF ATTORNEY FOR HEALTH CARE A health care proxy is a person (agent) appointed to make medical decisions for you if you cannot. Generally, people choose someone they know well and trust to represent their preferences when they can no longer do so. You should be sure to ask this person for agreement to act as your agent. An agent may have to exercise judgment in the event of a medical decision for which your wishes are not known. The durable power of attorney for health care is the legal document that names your health care proxy. Once written, it should be:  Signed.  Notarized.  Dated.  Copied.  Witnessed.  Incorporated into your medical record. You may also want to appoint someone to manage your financial affairs if you cannot. This is called a durable power of attorney for finances. It is a separate legal document from the durable power of attorney for health care. You may choose the same person or someone different from your health care proxy to act as your agent in financial matters.   This information is not intended to replace advice given to you by your health care provider. Make sure you discuss any   questions you have with your health care provider.   Document Released: 08/16/2007 Document Revised: 05/14/2013 Document Reviewed: 09/26/2012 Elsevier Interactive Patient Education 2016 Elsevier Inc.  

## 2016-03-16 DIAGNOSIS — H401133 Primary open-angle glaucoma, bilateral, severe stage: Secondary | ICD-10-CM | POA: Diagnosis not present

## 2016-04-15 DIAGNOSIS — H401123 Primary open-angle glaucoma, left eye, severe stage: Secondary | ICD-10-CM | POA: Diagnosis not present

## 2016-05-02 DIAGNOSIS — H401133 Primary open-angle glaucoma, bilateral, severe stage: Secondary | ICD-10-CM | POA: Diagnosis not present

## 2016-05-25 ENCOUNTER — Ambulatory Visit
Admission: RE | Admit: 2016-05-25 | Discharge: 2016-05-25 | Disposition: A | Discharge status: Home / Self Care | Payer: Medicare PPO | Source: Ambulatory Visit | Attending: Internal Medicine | Admitting: Internal Medicine

## 2016-05-25 DIAGNOSIS — C61 Malignant neoplasm of prostate: Secondary | ICD-10-CM | POA: Diagnosis not present

## 2016-05-25 DIAGNOSIS — Z13820 Encounter for screening for osteoporosis: Secondary | ICD-10-CM | POA: Insufficient documentation

## 2016-05-25 DIAGNOSIS — Z79899 Other long term (current) drug therapy: Secondary | ICD-10-CM | POA: Diagnosis not present

## 2016-06-02 ENCOUNTER — Inpatient Hospital Stay: Payer: Medicare PPO | Attending: Internal Medicine | Admitting: Internal Medicine

## 2016-06-02 ENCOUNTER — Inpatient Hospital Stay: Payer: Medicare PPO

## 2016-06-02 VITALS — BP 147/76 | HR 57 | Temp 96.0°F | Resp 18 | Ht 68.5 in | Wt 157.0 lb

## 2016-06-02 DIAGNOSIS — K219 Gastro-esophageal reflux disease without esophagitis: Secondary | ICD-10-CM | POA: Insufficient documentation

## 2016-06-02 DIAGNOSIS — K589 Irritable bowel syndrome without diarrhea: Secondary | ICD-10-CM | POA: Diagnosis not present

## 2016-06-02 DIAGNOSIS — Z8546 Personal history of malignant neoplasm of prostate: Secondary | ICD-10-CM | POA: Diagnosis not present

## 2016-06-02 DIAGNOSIS — Z79899 Other long term (current) drug therapy: Secondary | ICD-10-CM | POA: Diagnosis not present

## 2016-06-02 DIAGNOSIS — C61 Malignant neoplasm of prostate: Secondary | ICD-10-CM

## 2016-06-02 DIAGNOSIS — Z79818 Long term (current) use of other agents affecting estrogen receptors and estrogen levels: Secondary | ICD-10-CM | POA: Insufficient documentation

## 2016-06-02 DIAGNOSIS — Z923 Personal history of irradiation: Secondary | ICD-10-CM | POA: Insufficient documentation

## 2016-06-02 DIAGNOSIS — D649 Anemia, unspecified: Secondary | ICD-10-CM | POA: Insufficient documentation

## 2016-06-02 DIAGNOSIS — H409 Unspecified glaucoma: Secondary | ICD-10-CM | POA: Diagnosis not present

## 2016-06-02 DIAGNOSIS — I10 Essential (primary) hypertension: Secondary | ICD-10-CM | POA: Diagnosis not present

## 2016-06-02 DIAGNOSIS — R232 Flushing: Secondary | ICD-10-CM | POA: Diagnosis not present

## 2016-06-02 DIAGNOSIS — Z7982 Long term (current) use of aspirin: Secondary | ICD-10-CM | POA: Diagnosis not present

## 2016-06-02 DIAGNOSIS — Z87891 Personal history of nicotine dependence: Secondary | ICD-10-CM | POA: Diagnosis not present

## 2016-06-02 DIAGNOSIS — E785 Hyperlipidemia, unspecified: Secondary | ICD-10-CM | POA: Diagnosis not present

## 2016-06-02 DIAGNOSIS — Z86010 Personal history of colonic polyps: Secondary | ICD-10-CM | POA: Diagnosis not present

## 2016-06-02 LAB — COMPREHENSIVE METABOLIC PANEL
ALK PHOS: 55 U/L (ref 38–126)
ALT: 23 U/L (ref 17–63)
ANION GAP: 4 — AB (ref 5–15)
AST: 30 U/L (ref 15–41)
Albumin: 4.2 g/dL (ref 3.5–5.0)
BILIRUBIN TOTAL: 0.6 mg/dL (ref 0.3–1.2)
BUN: 42 mg/dL — ABNORMAL HIGH (ref 6–20)
CALCIUM: 9.4 mg/dL (ref 8.9–10.3)
CO2: 26 mmol/L (ref 22–32)
CREATININE: 1.04 mg/dL (ref 0.61–1.24)
Chloride: 103 mmol/L (ref 101–111)
GFR calc non Af Amer: >60 mL/min (ref >60)
Glucose, Bld: 158 mg/dL — ABNORMAL HIGH (ref 65–99)
Potassium: 4.4 mmol/L (ref 3.5–5.1)
Sodium: 133 mmol/L — ABNORMAL LOW (ref 135–145)
TOTAL PROTEIN: 7.5 g/dL (ref 6.5–8.1)

## 2016-06-02 LAB — CBC WITH DIFFERENTIAL/PLATELET
Basophils Absolute: 0 10*3/uL (ref 0–0.1)
Basophils Relative: 1 %
Eosinophils Absolute: 0.1 10*3/uL (ref 0–0.7)
Eosinophils Relative: 1 %
HEMATOCRIT: 37.9 % — AB (ref 40.0–52.0)
Hemoglobin: 12.6 g/dL — ABNORMAL LOW (ref 13.0–18.0)
LYMPHS ABS: 1.2 10*3/uL (ref 1.0–3.6)
LYMPHS PCT: 20 %
MCH: 28.6 pg (ref 26.0–34.0)
MCHC: 33.2 g/dL (ref 32.0–36.0)
MCV: 86.1 fL (ref 80.0–100.0)
MONOS PCT: 5 %
Monocytes Absolute: 0.3 10*3/uL (ref 0.2–1.0)
NEUTROS ABS: 4.4 10*3/uL (ref 1.4–6.5)
NEUTROS PCT: 73 %
Platelets: 192 10*3/uL (ref 150–440)
RBC: 4.41 MIL/uL (ref 4.40–5.90)
RDW: 13.1 % (ref 11.5–14.5)
WBC: 6 10*3/uL (ref 3.8–10.6)

## 2016-06-02 LAB — PSA: PSA: 0.6 ng/mL (ref 0.00–4.00)

## 2016-06-02 MED ORDER — LEUPROLIDE ACETATE (3 MONTH) 22.5 MG IM KIT
22.5000 mg | PACK | Freq: Once | INTRAMUSCULAR | Status: AC
  Administered 2016-06-02: 22.5 mg via INTRAMUSCULAR
  Filled 2016-06-02: qty 22.5

## 2016-06-02 NOTE — Progress Notes (Signed)
[place] CONSULT NOTE  Patient Care Team: [name], MD as PCP - General (Family Medicine) Rondel Oh, MD as Consulting Physician (Ophthalmology)  CHIEF COMPLAINTS/PURPOSE OF CONSULTATION:  Oncology History   # external beam radiation therapy and radiation seed implant in 2005 with Dr. Joelyn Oms and Dr. Danny Lawless for T1c Gleason 3+4 = 7 prostate cancer with a pretreatment PSA of 6.8. In November 2015, he was found to have biochemical recurrence.  Dec 2016- CT a/p & Bone scan- NEG; PSA- 11; June 2017- 19; STARTED on Firmagon [Dr.Budzyn]; on Lupron q 34M  # JAN 2018- Dexa scan- N     Primary adenocarcinoma of prostate ([place])    Initial Diagnosis    Primary adenocarcinoma of prostate ([place])        HISTORY OF PRESENTING ILLNESS:  Thomas Lopez 83 y.o.  male very pleasant patient with above history of prostate cancer - Currently with recurrent prostate cancer/biochemical recurrence- hormone sensitive on ADT is here for follow-up.  Patient's hot flashes and improved since starting on Effexor at last visit. She denies any bone pain. Patient's appetite is good. He is not losing any weight. Denies any difficulty in urination. No chest pain or shortness of breath or cough. No blood in urine.  ROS: A complete 10 point review of system is done which is negative except mentioned above in history of present illness  MEDICAL HISTORY:  Past Medical History:  Diagnosis Date  . Aorto-iliac atherosclerosis ([place]) 04/2015   by CT scan  . Carotid stenosis 11/2014   mild bilateral 1-39%, rpt 2 yrs  . Colon polyps    rpt colonoscopy due 2014  . ED (erectile dysfunction)    Trimix and VED  . Esophagitis   . Essential hypertension 06/29/2009  . GERD (gastroesophageal reflux disease)   . Glaucoma   . HLD (hyperlipidemia)   . HTN (hypertension)   . Hyperglycemia   . IBS (irritable bowel syndrome)   . Prostate cancer [facility]) 2005   s/p seed implant, EBRT ([facility]. Alinda [facility] at  [facility]) recurrent treating with androgen deprivation referred to cancer center  . Smoking history     SURGICAL HISTORY: Past Surgical History:  Procedure Laterality Date  . CATARACT EXTRACTION, BILATERAL  December 2016   [place], Dr. Raynelle Fanning  . COLONOSCOPY  03/01/01   Multiple polyps, divertic//adenomatous polyps  . COLONOSCOPY  04/24/01   multiple polyps  . COLONOSCOPY  10/23/01   Polyps benign-repeat every 2 years  . COLONOSCOPY  06/23/04   Adenom/hyperplastic polyps; multiple external hemorrhoids  . COLONOSCOPY  06/08/07   single small polyp-repeat 5 years  . ESOPHAGOGASTRODUODENOSCOPY  03/01/01   H.H.; gastritis esoph. dudodenitis  . NCS/Wrists Left Carpal  2/02   Min./right improved  . PFTs  10/2012   FVC 64%, FEV1 41%, ratio 0.62  . PROSTATE BIOPSY  12/04   positive; radioactive seed implant (Dr. Joelyn Oms)  . Prostate Ext Beam Radiation  1/27-07/23/03   Dr. Danny Lawless  . SPIROMETRY  09/2011   WNL  . Wrist Release  6/98; 9/99   Right    SOCIAL HISTORY: Social History   Social History  . Marital status: Legally Separated    Spouse name: N/A  . Number of children: 5  . Years of education: N/A   Occupational History  . Retired    Social History Main Topics  . Smoking status: Former Smoker    Quit date: 05/23/1993  . Smokeless tobacco: Never Used  .  Alcohol use Yes     Comment: Rare  . Drug use: No  . Sexual activity: Yes   Other Topics Concern  . 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 Cancer Mother     colon  . Hyperlipidemia Mother   . Other Mother     Carotid disease  . Coronary artery disease Neg Hx   . Stroke Neg Hx     ALLERGIES:  has No Known Allergies.  MEDICATIONS:  Current  Outpatient Prescriptions  Medication Sig Dispense Refill  . aspirin EC 81 MG tablet Take 81 mg by mouth daily.    . brimonidine (ALPHAGAN P) 0.1 % SOLN Apply 1 drop to eye at bedtime.    . dorzolamide-timolol (COSOPT) 22.3-6.8 MG/ML ophthalmic solution As directed    . latanoprost (XALATAN) 0.005 % ophthalmic solution Place 1 drop into both eyes at bedtime.    Marland Kitchen lisinopril (PRINIVIL,ZESTRIL) 10 MG tablet Take 10 mg by mouth daily.    . methylcellulose packet Take 1 each by mouth as needed for constipation. Reported on 11/05/2015    . omeprazole (PRILOSEC) 40 MG capsule take 1 capsule by mouth once daily 90 capsule 3  . rosuvastatin (CRESTOR) 10 MG tablet Take 1 tablet (10 mg total) by mouth daily. 90 tablet 3  . venlafaxine XR (EFFEXOR-XR) 37.5 MG 24 hr capsule Take 1 capsule (37.5 mg total) by mouth daily with breakfast. 30 capsule 3   No current facility-administered medications for this visit.       Marland Kitchen  PHYSICAL EXAMINATION: ECOG PERFORMANCE STATUS: 0 - Asymptomatic  Vitals:   06/02/16 1016  BP: (!) 147/76  Pulse: (!) 57  Resp: 18  Temp: (!) 96 F (35.6 C)   Filed Weights   06/02/16 1016  Weight: 157 lb (71.2 kg)    GENERAL: Well-nourished well-developed; Alert, no distress and comfortable.   Alone.  EYES: no pallor or icterus OROPHARYNX: no thrush or ulceration; good dentition  NECK: supple, no masses felt LYMPH:  no palpable lymphadenopathy in the cervical, axillary or inguinal regions LUNGS: clear to auscultation and  No wheeze or crackles HEART/CVS: regular rate & rhythm and no murmurs; No lower extremity edema ABDOMEN: abdomen soft, non-tender and normal bowel sounds Musculoskeletal:no cyanosis of digits and no clubbing  PSYCH: alert & oriented x 3 with fluent speech NEURO: no focal motor/sensory deficits SKIN:  no rashes or significant lesions  LABORATORY DATA:  I have reviewed the data as listed Lab Results  Component Value Date   WBC 6.0 06/02/2016   HGB  12.6 (L) 06/02/2016   HCT 37.9 (L) 06/02/2016   MCV 86.1 06/02/2016   PLT 192 06/02/2016    Recent Labs  12/03/15 0945 03/03/16 0935 06/02/16 0926  NA 135 138 133*  K 3.8 4.2 4.4  CL 103 104 103  CO2 25 25 26   GLUCOSE 130* 125* 158*  BUN 39* 43* 42*  CREATININE 0.86 1.14 1.04  CALCIUM 9.4 9.6 9.4  GFRNONAA >60 58* >60  GFRAA >60 >60 >60  PROT 8.3* 7.7 7.5  ALBUMIN 4.2 4.3 4.2  AST 24 21 30   ALT 20 19 23   ALKPHOS 63 58 55  BILITOT 0.6 0.7 0.6  RADIOGRAPHIC STUDIES: I have personally reviewed the radiological images as listed and agreed with the findings in the report. Dg Bone Density  Result Date: 05/25/2016 EXAM: DUAL X-RAY ABSORPTIOMETRY (DXA) FOR BONE MINERAL DENSITY IMPRESSION: Dear Dr. Rogue Bussing, Your patient Hence Kosten completed a BMD test on 05/25/2016 using the [place] (analysis version: 14.10) manufactured by [employer]. The following summarizes the results of our evaluation. PATIENT BIOGRAPHICAL: Name: Braelyn, Corlett Patient ID: [id number] Birth Date: 10-27-1932 Height: 68.5 in. Gender: Male Exam Date: 05/25/2016 Weight: 159.0 lbs. Indications: Advanced Age, glaucoma, Height Loss, High Risk Meds, Hx of tobacco use, Prostate ca Fractures: Treatments: ASPRIN 81 MG, crestor, lupron injections, omeprazole ASSESSMENT: The BMD measured at Femur Total Right is 1.323 g/cm2 with a T-score of 1.5. This patient is consider NORMAL according to the [facility]) criteria. Site Region Measured Measured WHO Young Adult BMD Date       Age      Classification T-score AP Spine L1-L4 7/25/[age] 81.7 Normal 3.1 1.617 g/cm2 DualFemur Total Right 05/25/2016 81.5 Normal 1.5 1.323 g/cm2 World Health Organization [facility]) criteria for post-menopausal, Caucasian Women: Normal:       T-score at or above -1 SD Osteopenia:   T-score between -1 and -2.5 SD Osteoporosis: T-score at or below -2.5 SD RECOMMENDATIONS: [place] recommends that  FDA-approved medical therapies be considered in postmenopausal women and men age 33 or older with a: 1. Hip or vertebral (clinical or morphometric) fracture. 2. T-score of < -2.5 at the spine or hip. 3. Ten-year fracture probability by FRAX of 3% or greater for hip fracture or 20% or greater for major osteoporotic fracture. All treatment decisions require clinical judgment and consideration of individual patient factors, including patient preferences, co-morbidities, previous drug use, risk factors not captured in the FRAX model (e.g. falls, vitamin D deficiency, increased bone turnover, interval significant decline in bone density) and possible under - or over-estimation of fracture risk by FRAX. All patients should ensure an adequate intake of dietary calcium (1200 mg/d) and vitamin D (800 IU daily) unless contraindicated. FOLLOW-UP: People with diagnosed cases of osteoporosis or osteopenia should be regularly tested for bone mineral density. For patients eligible for Medicare, routine testing is allowed once every 2 years. The testing frequency can be increased to one year for patients who have rapidly progressing disease, or for those who are receiving medical therapy to restore bone mass. I have reviewed this report, and agree with the above findings. Mark A. Thornton Papas, M.D. [facility] Electronically Signed   By: Lavonia Dana M.D.   On: 05/25/2016 11:49    ASSESSMENT & PLAN:   Primary adenocarcinoma of prostate ([place]) Metastatic prostate cancer Hormone sensitive- biochemical recurrence [M0]- given the rapid doubling time of PSA patient on- ADT every 3 months. PSA  Oct 2017- 0.77; awaiting labs from today.   # Hot flashes- improved on effexor.   # Anemia- Hb 12.5; not iron deficient; monitor for now.    #  DEXA scan jan 2018- NO osteoporosis; recommend Ca+ vit D  #Follow with me labs PSA Lupron shot in 3 months.      Cammie Sickle, MD 06/02/2016 12:20 PM

## 2016-06-02 NOTE — Assessment & Plan Note (Addendum)
Metastatic prostate cancer Hormone sensitive- biochemical recurrence [M0]- given the rapid doubling time of PSA patient on- ADT every 3 months. PSA  Oct 2017- 0.77; awaiting labs from today.   # Hot flashes- improved on effexor.   # Anemia- Hb 12.5; not iron deficient; monitor for now.    # reviewed the DEXA scan jan 2018- NO osteoporosis; recommend Ca+ vit D  #Follow with me labs PSA Lupron shot in 3 months.

## 2016-06-02 NOTE — Progress Notes (Signed)
Patient here for his lupron injection today. He voices that he "has no medical complaints today."

## 2016-06-20 DIAGNOSIS — H401133 Primary open-angle glaucoma, bilateral, severe stage: Secondary | ICD-10-CM | POA: Diagnosis not present

## 2016-07-28 ENCOUNTER — Telehealth: Payer: Self-pay | Admitting: *Deleted

## 2016-07-28 ENCOUNTER — Inpatient Hospital Stay: Payer: Medicare PPO | Attending: Internal Medicine

## 2016-07-28 ENCOUNTER — Other Ambulatory Visit: Payer: Self-pay | Admitting: *Deleted

## 2016-07-28 ENCOUNTER — Other Ambulatory Visit: Payer: Self-pay | Admitting: Internal Medicine

## 2016-07-28 DIAGNOSIS — Z79818 Long term (current) use of other agents affecting estrogen receptors and estrogen levels: Secondary | ICD-10-CM | POA: Diagnosis not present

## 2016-07-28 DIAGNOSIS — N390 Urinary tract infection, site not specified: Secondary | ICD-10-CM | POA: Diagnosis not present

## 2016-07-28 DIAGNOSIS — R300 Dysuria: Secondary | ICD-10-CM

## 2016-07-28 DIAGNOSIS — C61 Malignant neoplasm of prostate: Secondary | ICD-10-CM | POA: Insufficient documentation

## 2016-07-28 DIAGNOSIS — Z923 Personal history of irradiation: Secondary | ICD-10-CM | POA: Insufficient documentation

## 2016-07-28 LAB — URINALYSIS, COMPLETE (UACMP) WITH MICROSCOPIC
BACTERIA UA: NONE SEEN
BILIRUBIN URINE: NEGATIVE
Glucose, UA: NEGATIVE mg/dL
Ketones, ur: NEGATIVE mg/dL
Nitrite: NEGATIVE
PROTEIN: 100 mg/dL — AB
SPECIFIC GRAVITY, URINE: 1.015 (ref 1.005–1.030)
SQUAMOUS EPITHELIAL / LPF: NONE SEEN
pH: 8 (ref 5.0–8.0)

## 2016-07-28 MED ORDER — CIPROFLOXACIN HCL 500 MG PO TABS: 500.0000 mg | ORAL_TABLET | Freq: Two times a day (BID) | ORAL | 0 refills | Status: DC

## 2016-07-28 NOTE — Addendum Note (Signed)
Addended by: Sabino Gasser on: 07/28/2016 03:28 PM   Modules accepted: Miquel Dunn

## 2016-07-28 NOTE — Telephone Encounter (Signed)
Patient agrees to come in at 11:30 today. Lab notified of add on

## 2016-07-28 NOTE — Telephone Encounter (Addendum)
Per Dr. Lyda Perone results demonstrate UTI. V/o to call in cipro 500 mg twice daily x 1 week. Contacted patient. Verified that pt is not allergic to any abx-no known allergies.  rx sent to patient's pharmacy-[facility]. Reviewed side effects of drug with patient. Asked patient to drink plenty of fluids as drug can be harsh on kidney function. He states that he drinks 64+ ounces of water/day. Teach back process performed with patient.

## 2016-07-28 NOTE — Telephone Encounter (Signed)
md approved ua C&S today. Orders entered- pls let pt know.

## 2016-07-28 NOTE — Telephone Encounter (Signed)
Called to report that he is having burning with urination and that his urine color has gotten darker. Has had fever off and on for 3 days up 102 and as low as 96. Please advise

## 2016-07-29 ENCOUNTER — Other Ambulatory Visit: Payer: Self-pay | Admitting: *Deleted

## 2016-07-29 DIAGNOSIS — R300 Dysuria: Secondary | ICD-10-CM

## 2016-07-29 MED ORDER — CIPROFLOXACIN HCL 500 MG PO TABS: 500.0000 mg | ORAL_TABLET | Freq: Two times a day (BID) | ORAL | 0 refills | Status: DC

## 2016-07-29 NOTE — Progress Notes (Signed)
Patient came to cancer center today. rx for cipro was sent to patient's mail order pharmacy.  Pt requesting that rx be sent to [facility] in [place]. rx sent to pt's pharmacy preference.

## 2016-07-30 LAB — URINE CULTURE

## 2016-08-09 ENCOUNTER — Ambulatory Visit: Payer: Medicare Other

## 2016-08-09 ENCOUNTER — Other Ambulatory Visit: Payer: Self-pay | Admitting: *Deleted

## 2016-08-09 ENCOUNTER — Other Ambulatory Visit: Payer: Medicare Other

## 2016-08-09 DIAGNOSIS — C61 Malignant neoplasm of prostate: Secondary | ICD-10-CM

## 2016-08-12 ENCOUNTER — Other Ambulatory Visit: Payer: Self-pay | Admitting: *Deleted

## 2016-08-12 DIAGNOSIS — C61 Malignant neoplasm of prostate: Secondary | ICD-10-CM

## 2016-08-12 MED ORDER — VENLAFAXINE HCL ER 37.5 MG PO CP24: 37.5000 mg | ORAL_CAPSULE | Freq: Every day | ORAL | 0 refills | Status: DC

## 2016-08-14 ENCOUNTER — Other Ambulatory Visit: Payer: Self-pay | Admitting: Family Medicine

## 2016-08-14 DIAGNOSIS — E782 Mixed hyperlipidemia: Secondary | ICD-10-CM

## 2016-08-15 ENCOUNTER — Ambulatory Visit (INDEPENDENT_AMBULATORY_CARE_PROVIDER_SITE_OTHER): Payer: Medicare PPO

## 2016-08-15 ENCOUNTER — Encounter (INDEPENDENT_AMBULATORY_CARE_PROVIDER_SITE_OTHER): Payer: Self-pay

## 2016-08-15 VITALS — BP 110/60 | HR 52 | Temp 97.5°F | Ht 67.5 in | Wt 153.5 lb

## 2016-08-15 DIAGNOSIS — E782 Mixed hyperlipidemia: Secondary | ICD-10-CM

## 2016-08-15 DIAGNOSIS — Z0000 Encounter for general adult medical examination without abnormal findings: Secondary | ICD-10-CM

## 2016-08-15 LAB — LIPID PANEL
CHOLESTEROL: 163 mg/dL (ref 0–200)
HDL: 40 mg/dL (ref >39.00)
LDL CALC: 96 mg/dL (ref 0–99)
NonHDL: 122.72
TRIGLYCERIDES: 132 mg/dL (ref 0.0–149.0)
Total CHOL/HDL Ratio: 4
VLDL: 26.4 mg/dL (ref 0.0–40.0)

## 2016-08-15 LAB — TSH: TSH: 1.56 u[IU]/mL (ref 0.35–4.50)

## 2016-08-15 NOTE — Progress Notes (Signed)
PCP notes:   Health maintenance:  Tetanus - postponed; insurance  Abnormal screenings:   Hearing - failed  Patient concerns:   None  Nurse concerns:  None  Next PCP appt:   3/27 @ 0900

## 2016-08-15 NOTE — Patient Instructions (Signed)
--Please contact [employer] regarding coverage for Tetanus vaccine.   Thomas Lopez , Thank you for taking time to come for your Medicare Wellness Visit. I appreciate your ongoing commitment to your health goals. Please review the following plan we discussed and let me know if I can assist you in the future.   These are the goals we discussed: Goals    . Eat more fruits and vegetables          Starting 08/17/15, I will increase my intake of fresh fruits and vegetables to at least 5 servings per day.        This is a list of the screening recommended for you and due dates:  Health Maintenance  Topic Date Due  . DTaP/Tdap/Td vaccine (1 - Tdap) 08/20/2017*  . Tetanus Vaccine  08/20/2017*  . Colon Cancer Screening  09/24/2017  . Flu Shot  Completed  . Pneumonia vaccines  Completed  *Topic was postponed. The date shown is not the original due date.   Preventive Care for Adults  A healthy lifestyle and preventive care can promote health and wellness. Preventive health guidelines for adults include the following key practices.  . A routine yearly physical is a good way to check with your health care provider about your health and preventive screening. It is a chance to share any concerns and updates on your health and to receive a thorough exam.  . Visit your dentist for a routine exam and preventive care every 6 months. Brush your teeth twice a day and floss once a day. Good oral hygiene prevents tooth decay and gum disease.  . The frequency of eye exams is based on your age, health, family medical history, use  of contact lenses, and other factors. Follow your health care provider's ecommendations for frequency of eye exams.  . Eat a healthy diet. Foods like vegetables, fruits, whole grains, low-fat dairy products, and lean protein foods contain the nutrients you need without too many calories. Decrease your intake of foods high in solid fats, added sugars, and salt. Eat the right amount of  calories for you. Get information about a proper diet from your health care provider, if necessary.  . Regular physical exercise is one of the most important things you can do for your health. Most adults should get at least 150 minutes of moderate-intensity exercise (any activity that increases your heart rate and causes you to sweat) each week. In addition, most adults need muscle-strengthening exercises on 2 or more days a week.  Silver Sneakers may be a benefit available to you. To determine eligibility, you may visit the website: www.silversneakers.com or contact program at [phone] Mon-Fri between 8AM-8PM.   . Maintain a healthy weight. The body mass index (BMI) is a screening tool to identify possible weight problems. It provides an estimate of body fat based on height and weight. Your health care provider can find your BMI and can help you achieve or maintain a healthy weight.   For adults 20 years and older: ? A BMI below 18.5 is considered underweight. ? A BMI of 18.5 to 24.9 is normal. ? A BMI of 25 to 29.9 is considered overweight. ? A BMI of 30 and above is considered obese.   . Maintain normal blood lipids and cholesterol levels by exercising and minimizing your intake of saturated fat. Eat a balanced diet with plenty of fruit and vegetables. Blood tests for lipids and cholesterol should begin at age 70 and be repeated every 5  years. If your lipid or cholesterol levels are high, you are over 50, or you are at high risk for heart disease, you may need your cholesterol levels checked more frequently. Ongoing high lipid and cholesterol levels should be treated with medicines if diet and exercise are not working.  . If you smoke, find out from your health care provider how to quit. If you do not use tobacco, please do not start.  . If you choose to drink alcohol, please do not consume more than 2 drinks per day. One drink is considered to be 12 ounces (355 mL) of beer, 5 ounces  (148 mL) of wine, or 1.5 ounces (44 mL) of liquor.  . If you are 22-17 years old, ask your health care provider if you should take aspirin to prevent strokes.  . Use sunscreen. Apply sunscreen liberally and repeatedly throughout the day. You should seek shade when your shadow is shorter than you. Protect yourself by wearing long sleeves, pants, a wide-brimmed hat, and sunglasses year round, whenever you are outdoors.  . Once a month, do a whole body skin exam, using a mirror to look at the skin on your back. Tell your health care provider of new moles, moles that have irregular borders, moles that are larger than a pencil eraser, or moles that have changed in shape or color.

## 2016-08-15 NOTE — Progress Notes (Signed)
Pre visit review using our clinic review tool, if applicable. No additional management support is needed unless otherwise documented below in the visit note. 

## 2016-08-15 NOTE — Progress Notes (Signed)
Subjective:   Thomas Lopez is a 83 y.o. male who presents for Medicare Annual/Subsequent preventive examination.  Review of Systems:  N/A Cardiac Risk Factors include: advanced age (>39men, >35 women);male gender;hypertension;dyslipidemia     Objective:    Vitals: BP 110/60 (BP Location: Left Arm, Patient Position: Sitting, Cuff Size: Normal)   Pulse (!) 52   Temp 97.5 F (36.4 C) (Oral)   Ht 5' 7.5" (1.715 m) Comment: no shoes  Wt 153 lb 8 oz (69.6 kg)   SpO2 100%   BMI 23.69 kg/m   Body mass index is 23.69 kg/m.  Tobacco History  Smoking Status  . Former Smoker  . Quit date: 05/23/1993  Smokeless Tobacco  . Never Used     Counseling given: No   Past Medical History:  Diagnosis Date  . Aorto-iliac atherosclerosis ([place]) 04/2015   by CT scan  . Carotid stenosis 11/2014   mild bilateral 1-39%, rpt 2 yrs  . Colon polyps    rpt colonoscopy due 2014  . ED (erectile dysfunction)    Trimix and VED  . Esophagitis   . Essential hypertension 06/29/2009  . GERD (gastroesophageal reflux disease)   . Glaucoma   . HLD (hyperlipidemia)   . HTN (hypertension)   . Hyperglycemia   . IBS (irritable bowel syndrome)   . Prostate cancer [facility]) 2005   s/p seed implant, EBRT (Dr. Alinda Money at [facility]) recurrent treating with androgen deprivation referred to cancer center  . Smoking history    Past Surgical History:  Procedure Laterality Date  . CATARACT EXTRACTION, BILATERAL  December 2016   [place], Dr. Raynelle Fanning  . COLONOSCOPY  03/01/01   Multiple polyps, divertic//adenomatous polyps  . COLONOSCOPY  04/24/01   multiple polyps  . COLONOSCOPY  10/23/01   Polyps benign-repeat every 2 years  . COLONOSCOPY  06/23/04   Adenom/hyperplastic polyps; multiple external hemorrhoids  . COLONOSCOPY  06/08/07   single small polyp-repeat 5 years  . ESOPHAGOGASTRODUODENOSCOPY  03/01/01   H.H.; gastritis esoph. dudodenitis  . NCS/Wrists Left Carpal  2/02   Min./right improved   . PFTs  10/2012   FVC 64%, FEV1 41%, ratio 0.62  . PROSTATE BIOPSY  12/04   positive; radioactive seed implant (Dr. Joelyn Oms)  . Prostate Ext Beam Radiation  1/27-07/23/03   Dr. Danny Lawless  . SPIROMETRY  09/2011   WNL  . Wrist Release  6/98; 9/99   Right   Family History  Problem Relation Age of Onset  . Cancer Mother     colon  . Hyperlipidemia Mother   . Other Mother     Carotid disease  . Coronary artery disease Neg Hx   . Stroke Neg Hx    History  Sexual Activity  . Sexual activity: Yes    Outpatient Encounter Prescriptions as of 08/15/2016  Medication Sig  . aspirin EC 81 MG tablet Take 81 mg by mouth daily.  . brimonidine (ALPHAGAN P) 0.1 % SOLN Apply 1 drop to eye at bedtime.  . dorzolamide-timolol (COSOPT) 22.3-6.8 MG/ML ophthalmic solution As directed  . latanoprost (XALATAN) 0.005 % ophthalmic solution Place 1 drop into both eyes at bedtime.  Marland Kitchen lisinopril (PRINIVIL,ZESTRIL) 10 MG tablet Take 10 mg by mouth daily.  . methylcellulose packet Take 1 each by mouth as needed for constipation. Reported on 11/05/2015  . omeprazole (PRILOSEC) 40 MG capsule take 1 capsule by mouth once daily  . rosuvastatin (CRESTOR) 10 MG tablet Take 1 tablet (10 mg  total) by mouth daily.  Marland Kitchen venlafaxine XR (EFFEXOR-XR) 37.5 MG 24 hr capsule Take 1 capsule (37.5 mg total) by mouth daily with breakfast.  . [DISCONTINUED] ciprofloxacin (CIPRO) 500 MG tablet Take 1 tablet (500 mg total) by mouth 2 (two) times daily.   No facility-administered encounter medications on file as of 08/15/2016.     Activities of Daily Living In your present state of health, do you have any difficulty performing the following activities: 08/15/2016  Hearing? Y  Vision? Y  Difficulty concentrating or making decisions? N  Walking or climbing stairs? N  Dressing or bathing? N  Doing errands, shopping? N  Preparing Food and eating ? N  Using the Toilet? N  In the past six months, have you accidently leaked urine? N  Do  you have problems with loss of bowel control? N  Managing your Medications? N  Managing your Finances? N  Housekeeping or managing your Housekeeping? N  Some recent data might be hidden    Patient Care Team: [name], MD as PCP - General (Family Medicine) Rondel Oh, MD as Consulting Physician (Ophthalmology)   Assessment:     Hearing Screening   125Hz  250Hz  500Hz  1000Hz  2000Hz  3000Hz  4000Hz  6000Hz  8000Hz   Right ear:   0 0 40  0    Left ear:   0 0 40  0    Vision Screening Comments: Last vision exam in Dec 2017 with Dr. Drema Dallas   Exercise Activities and Dietary recommendations Current Exercise Habits: Home exercise routine, Type of exercise: Other - see comments (stationary bike), Time (Minutes): 20, Frequency (Times/Week): 3, Weekly Exercise (Minutes/Week): 60, Intensity: Mild, Exercise limited by: None identified  Goals    . Eat more fruits and vegetables          Starting 08/17/15, I will increase my intake of fresh fruits and vegetables to at least 5 servings per day.       Fall Risk Fall Risk  08/15/2016 08/14/2015 08/13/2014 08/09/2013 08/08/2012  Falls in the past year? No No No Yes No  Number falls in past yr: - - - 1 -  Injury with Fall? - - - Yes -   Depression Screen PHQ 2/9 Scores 08/15/2016 08/14/2015 08/13/2014 08/09/2013  PHQ - 2 Score 0 0 0 0    Cognitive Function MMSE - Mini Mental State Exam 08/15/2016 08/14/2015  Orientation to time 5 5  Orientation to Place 5 5  Registration 3 3  Attention/ Calculation 0 0  Recall 3 3  Language- name 2 objects 0 0  Language- repeat 1 1  Language- follow 3 step command 3 3  Language- read & follow direction 0 0  Write a sentence 0 0  Copy design 0 0  Total score 20 20       PLEASE NOTE: A Mini-Cog screen was completed. Maximum score is 20. A value of 0 denotes this part of Folstein MMSE was not completed or the patient failed this part of the Mini-Cog screening.   Mini-Cog Screening Orientation to Time  - Max 5 pts Orientation to Place - Max 5 pts Registration - Max 3 pts Recall - Max 3 pts Language Repeat - Max 1 pts Language Follow 3 Step Command - Max 3 pts c  Immunization History  Administered Date(s) Administered  . H1N1 06/24/2008  . Influenza Split 03/03/2011, 02/29/2012  . Influenza Whole 04/06/2007, 02/19/2008, 03/24/2009, 02/25/2010  . Influenza,inj,Quad PF,36+ Mos 02/26/2013, 02/19/2014, 02/25/2015, 02/12/2016  . Pneumococcal Conjugate-13 08/09/2013  .  Pneumococcal Polysaccharide-23 03/15/2002  . Td 05/26/2006  . Zoster 07/01/2010   Screening Tests Health Maintenance  Topic Date Due  . DTaP/Tdap/Td (1 - Tdap) 08/20/2017 (Originally 05/27/2006)  . TETANUS/TDAP  08/20/2017 (Originally 05/26/2016)  . COLONOSCOPY  09/24/2017  . INFLUENZA VACCINE  Completed  . PNA vac Low Risk Adult  Completed      Plan:     I have personally reviewed and addressed the Medicare Annual Wellness questionnaire and have noted the following in the patient's chart:  A. Medical and social history B. Use of alcohol, tobacco or illicit drugs  C. Current medications and supplements D. Functional ability and status E.  Nutritional status F.  Physical activity G. Advance directives H. List of other physicians I.  Hospitalizations, surgeries, and ER visits in previous 12 months J.  [place] to include hearing, vision, cognitive, depression L. Referrals and appointments - none  In addition, I have reviewed and discussed with patient certain preventive protocols, quality metrics, and best practice recommendations. A written personalized care plan for preventive services as well as general preventive health recommendations were provided to patient.  See attached scanned questionnaire for additional information.   Signed,   Lindell Noe, MHA, BS, LPN Health Coach

## 2016-08-16 ENCOUNTER — Ambulatory Visit (INDEPENDENT_AMBULATORY_CARE_PROVIDER_SITE_OTHER): Payer: Medicare PPO | Admitting: Family Medicine

## 2016-08-16 ENCOUNTER — Encounter: Payer: Self-pay | Admitting: Family Medicine

## 2016-08-16 VITALS — BP 152/70 | HR 52 | Temp 97.8°F | Ht 67.75 in | Wt 153.0 lb

## 2016-08-16 DIAGNOSIS — I708 Atherosclerosis of other arteries: Secondary | ICD-10-CM

## 2016-08-16 DIAGNOSIS — I6523 Occlusion and stenosis of bilateral carotid arteries: Secondary | ICD-10-CM

## 2016-08-16 DIAGNOSIS — I700 Atherosclerosis of aorta: Secondary | ICD-10-CM | POA: Diagnosis not present

## 2016-08-16 DIAGNOSIS — E782 Mixed hyperlipidemia: Secondary | ICD-10-CM

## 2016-08-16 DIAGNOSIS — R413 Other amnesia: Secondary | ICD-10-CM | POA: Diagnosis not present

## 2016-08-16 DIAGNOSIS — H401133 Primary open-angle glaucoma, bilateral, severe stage: Secondary | ICD-10-CM

## 2016-08-16 DIAGNOSIS — Z7189 Other specified counseling: Secondary | ICD-10-CM

## 2016-08-16 DIAGNOSIS — C61 Malignant neoplasm of prostate: Secondary | ICD-10-CM | POA: Diagnosis not present

## 2016-08-16 DIAGNOSIS — Z0000 Encounter for general adult medical examination without abnormal findings: Secondary | ICD-10-CM | POA: Diagnosis not present

## 2016-08-16 DIAGNOSIS — S4991XS Unspecified injury of right shoulder and upper arm, sequela: Secondary | ICD-10-CM | POA: Diagnosis not present

## 2016-08-16 DIAGNOSIS — I10 Essential (primary) hypertension: Secondary | ICD-10-CM

## 2016-08-16 DIAGNOSIS — I70299 Other atherosclerosis of native arteries of extremities, unspecified extremity: Secondary | ICD-10-CM

## 2016-08-16 NOTE — Assessment & Plan Note (Addendum)
Advanced directives - has completed living will with attorney at home. Will bring us copy. Has youngest daughter designated as proxy for health decisions in case he cannot make them.  

## 2016-08-16 NOTE — Assessment & Plan Note (Addendum)
Appreciate Dr Edilia Bo care.

## 2016-08-16 NOTE — Assessment & Plan Note (Signed)
Continue aspirin, statin.  

## 2016-08-16 NOTE — Progress Notes (Signed)
Pre visit review using our clinic review tool, if applicable. No additional management support is needed unless otherwise documented below in the visit note. 

## 2016-08-16 NOTE — Assessment & Plan Note (Addendum)
Chronic, mildly elevated today however pt endorses good control at home. No changes today - continue lisinopril 10mg . Recheck next visit 6 mo f/u

## 2016-08-16 NOTE — Assessment & Plan Note (Addendum)
s/p complete R RTC tear, improved with conservative measures (Poggi 09/2015) Previously limited bowling game, pt interested in restarting bowling this summer.

## 2016-08-16 NOTE — Assessment & Plan Note (Signed)
Some slowed responses. Normal minicog yesterday. Continue to monitor.

## 2016-08-16 NOTE — Progress Notes (Signed)
BP (!) 152/70 (BP Location: Right Arm, Cuff Size: Normal)   Pulse (!) 52   Temp 97.8 F (36.6 C) (Oral)   Ht 5' 7.75" (1.721 m)   Wt 153 lb (69.4 kg)   BMI 23.44 kg/m    CC: CPE Subjective:    Patient ID: Thomas Lopez, male    DOB: 1932/06/28, 83 y.o.   MRN: [id number]  HPI: Thomas Lopez is a 83 y.o. male presenting on 08/16/2016 for Annual Exam   Saw Katha Cabal yesterday for medicare wellness visit. Note reviewed.   Prostate cancer recurrence - followed by urology Dr Pilar Jarvis and oncology Dr Rogue Bussing.  H/o RTC injury/tear healed with conservative management. Limited bowling but interested in restart.   Preventative: Prostate cancer 2005 s/p recurrence 2015 followed by onc/uro, on ADT treatment Q39mo H/o colon polyps, last colonoscopy 09/2012 by Dr. [name] - tortuous colon, unable to complete. Was told would have virtual colonoscopy scheduled but was never contacted. Advised him to call GI to f/u 2016, never f/u. Denies BM changes or blood in stool.  Flu shot yearly Pneumovax 2003 (done after age 71).Prevnar 2015.  Td 2008  zostavax 06/2010  Advanced directives - has completed living will with attorney at home. Will bring [place] copy. Has youngest daughter designated as proxy for health decisions in case he cannot make them.  Seat belt use discussed Sunscreen use discussed. 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 Interim medical history since our last visit reviewed. Allergies and medications reviewed and updated. Outpatient Medications Prior to Visit  Medication Sig Dispense Refill  . aspirin EC 81 MG tablet Take 81 mg by mouth daily.    . brimonidine (ALPHAGAN P) 0.1 %  SOLN Apply 1 drop to eye at bedtime.    . dorzolamide-timolol (COSOPT) 22.3-6.8 MG/ML ophthalmic solution As directed    . latanoprost (XALATAN) 0.005 % ophthalmic solution Place 1 drop into both eyes at bedtime.    Marland Kitchen lisinopril (PRINIVIL,ZESTRIL) 10 MG tablet Take 10 mg by mouth daily.    . methylcellulose packet Take 1 each by mouth as needed for constipation. Reported on 11/05/2015    . omeprazole (PRILOSEC) 40 MG capsule take 1 capsule by mouth once daily 90 capsule 3  . rosuvastatin (CRESTOR) 10 MG tablet Take 1 tablet (10 mg total) by mouth daily. 90 tablet 3  . venlafaxine XR (EFFEXOR-XR) 37.5 MG 24 hr capsule Take 1 capsule (37.5 mg total) by mouth daily with breakfast. 90 capsule 0   No facility-administered medications prior to visit.      Per HPI unless specifically indicated in ROS section below Review of Systems  Constitutional: Negative for activity change, appetite change, chills, fatigue, fever and unexpected weight change.  HENT: Negative for hearing loss.   Eyes: Negative for visual disturbance.  Respiratory: Negative for cough, chest tightness, shortness of breath and wheezing.   Cardiovascular: Negative for chest pain, palpitations and leg swelling.  Gastrointestinal: Negative for abdominal distention, abdominal pain, blood in stool, constipation, diarrhea, nausea and vomiting.  Genitourinary: Negative for difficulty urinating and hematuria.  Musculoskeletal: Negative for arthralgias, myalgias and neck pain.  Skin: Negative for rash.  Neurological: Negative for dizziness, seizures, syncope and headaches.  Hematological: Negative for adenopathy. Does not bruise/bleed easily.  Psychiatric/Behavioral: Negative for dysphoric mood. The patient is not nervous/anxious.        Objective:    BP (!) 152/70 (BP Location: Right Arm, Cuff Size: Normal)   Pulse (!) 52   Temp 97.8 F (36.6 C) (Oral)   Ht 5' 7.75" (1.721 m)   Wt 153 lb (69.4 kg)   BMI 23.44 kg/m   Wt  Readings from Last 3 Encounters:  08/16/16 153 lb (69.4 kg)  08/15/16 153 lb 8 oz (69.6 kg)  06/02/16 157 lb (71.2 kg)    Physical Exam  Constitutional: He is oriented to person, place, and time. He appears well-developed and well-nourished. No distress.  HENT:  Head: Normocephalic and atraumatic.  Right Ear: Hearing, tympanic membrane, external ear and ear canal normal.  Left Ear: Hearing, tympanic membrane, external ear and ear canal normal.  Nose: Nose normal.  Mouth/Throat: Uvula is midline, oropharynx is clear and moist and mucous membranes are normal. No oropharyngeal exudate, posterior oropharyngeal edema or posterior oropharyngeal erythema.  Eyes: Conjunctivae and EOM are normal. Pupils are equal, round, and reactive to light. No scleral icterus.  Neck: Normal range of motion. Neck supple. Carotid bruit is not present. No thyromegaly present.  Cardiovascular: Normal rate, regular rhythm, normal heart sounds and intact distal pulses.   No murmur heard. Pulses:      Radial pulses are 2+ on the right side, and 2+ on the left side.  Pulmonary/Chest: Effort normal and breath sounds normal. No respiratory distress. He has no wheezes. He has no rales.  Abdominal: Soft. Bowel sounds are normal. He exhibits no distension and no mass. There is no tenderness. There is no rebound and no guarding.  Musculoskeletal: Normal range of motion. He exhibits no edema.  Lymphadenopathy:    He has no cervical adenopathy.  Neurological: He is alert and oriented to person, place, and time.  CN grossly intact, station and gait intact Some slowed responses  Skin: Skin is warm and dry. No rash noted.  Psychiatric: He has a normal mood and affect. His behavior is normal. Judgment and thought content normal.  Nursing note and vitals reviewed.  Results for orders placed or performed in visit on 08/15/16  Lipid panel  Result Value Ref Range   Cholesterol 163 0 - 200 mg/dL   Triglycerides 132.0 0.0 - 149.0  mg/dL   HDL 40.00 >39.00 mg/dL   VLDL 26.4 0.0 - 40.0 mg/dL   LDL Cholesterol 96 0 - 99 mg/dL   Total CHOL/HDL Ratio 4    NonHDL 122.72   TSH  Result Value Ref Range   TSH 1.56 0.35 - 4.50 uIU/mL      Assessment & Plan:   Problem List Items Addressed This Visit    Advanced care planning/counseling discussion    Advanced directives - has completed living will with attorney at home. Will bring [place] copy. Has youngest daughter designated as proxy for health decisions in case he cannot make them.       Aorto-iliac atherosclerosis ([facility])    Continue aspirin, statin.       Carotid stenosis    Not appreciated today. Consider rpt.       Essential hypertension    Chronic, mildly elevated today however pt endorses good control at home. No changes today - continue lisinopril 10mg . Recheck next visit 6 mo f/u      Health maintenance examination - Primary    Preventative protocols reviewed  and updated unless pt declined. Discussed healthy diet and lifestyle.       Memory impairment    Some slowed responses. Normal minicog yesterday. Continue to monitor.       Mixed hyperlipidemia    Chronic, stable. Great control on statin - continue       Primary adenocarcinoma of prostate ([facility])   Relevant Medications   leuprolide (LUPRON) 22.5 MG injection   Primary open angle glaucoma    Appreciate Dr Edilia Bo care.        Right shoulder injury    s/p complete R RTC tear, improved with conservative measures (Poggi 09/2015) Previously limited bowling game, pt interested in restarting bowling this summer.           Follow up plan: Return in about 6 months (around 02/16/2017) for follow up visit.  [name], MD

## 2016-08-16 NOTE — Assessment & Plan Note (Signed)
Preventative protocols reviewed and updated unless pt declined. Discussed healthy diet and lifestyle.  

## 2016-08-16 NOTE — Patient Instructions (Addendum)
Try every other day dosing of omeprazole heartburn medicine to see if symptoms remain well controlled.  Bring in blood pressure cuff from home to compare at next visit. Keep an eye on blood pressures at home and let us know if consistently >140/90.  Bring [place] copy of your living will We will recheck height today.  You are doing well today.  Continue current medicines. Return as needed or in 6 months for recheck blood pressure.    Health Maintenance, Male A healthy lifestyle and preventive care is important for your health and wellness. Ask your health care provider about what schedule of regular examinations is right for you. What should I know about weight and diet?  Eat a Healthy Diet  Eat plenty of vegetables, fruits, whole grains, low-fat dairy products, and lean protein.  Do not eat a lot of foods high in solid fats, added sugars, or salt. Maintain a Healthy Weight  Regular exercise can help you achieve or maintain a healthy weight. You should:  Do at least 150 minutes of exercise each week. The exercise should increase your heart rate and make you sweat (moderate-intensity exercise).  Do strength-training exercises at least twice a week. Watch Your Levels of Cholesterol and Blood Lipids  Have your blood tested for lipids and cholesterol every 5 years starting at 81 years of age. If you are at high risk for heart disease, you should start having your blood tested when you are 81 years old. You may need to have your cholesterol levels checked more often if:  Your lipid or cholesterol levels are high.  You are older than 81 years of age.  You are at high risk for heart disease. What should I know about cancer screening? Many types of cancers can be detected early and may often be prevented. Lung Cancer  You should be screened every year for lung cancer if:  You are a current smoker who has smoked for at least 30 years.  You are a former smoker who has quit within the past 15  years.  Talk to your health care provider about your screening options, when you should start screening, and how often you should be screened. Colorectal Cancer  Routine colorectal cancer screening usually begins at 81 years of age and should be repeated every 5-10 years until you are 81 years old. You may need to be screened more often if early forms of precancerous polyps or small growths are found. Your health care provider may recommend screening at an earlier age if you have risk factors for colon cancer.  Your health care provider may recommend using home test kits to check for hidden blood in the stool.  A small camera at the end of a tube can be used to examine your colon (sigmoidoscopy or colonoscopy). This checks for the earliest forms of colorectal cancer. Prostate and Testicular Cancer  Depending on your age and overall health, your health care provider may do certain tests to screen for prostate and testicular cancer.  Talk to your health care provider about any symptoms or concerns you have about testicular or prostate cancer. Skin Cancer  Check your skin from head to toe regularly.  Tell your health care provider about any new moles or changes in moles, especially if:  There is a change in a mole's size, shape, or color.  You have a mole that is larger than a pencil eraser.  Always use sunscreen. Apply sunscreen liberally and repeat throughout the day.  Protect  yourself by wearing long sleeves, pants, a wide-brimmed hat, and sunglasses when outside. What should I know about heart disease, diabetes, and high blood pressure?  If you are 26-72 years of age, have your blood pressure checked every 3-5 years. If you are 45 years of age or older, have your blood pressure checked every year. You should have your blood pressure measured twice-once when you are at a hospital or clinic, and once when you are not at a hospital or clinic. Record the average of the two measurements. To  check your blood pressure when you are not at a hospital or clinic, you can use:  An automated blood pressure machine at a pharmacy.  A home blood pressure monitor.  Talk to your health care provider about your target blood pressure.  If you are between 7-29 years old, ask your health care provider if you should take aspirin to prevent heart disease.  Have regular diabetes screenings by checking your fasting blood sugar level.  If you are at a normal weight and have a low risk for diabetes, have this test once every three years after the age of 42.  If you are overweight and have a high risk for diabetes, consider being tested at a younger age or more often.  A one-time screening for abdominal aortic aneurysm (AAA) by ultrasound is recommended for men aged 21-75 years who are current or former smokers. What should I know about preventing infection? Hepatitis B  If you have a higher risk for hepatitis B, you should be screened for this virus. Talk with your health care provider to find out if you are at risk for hepatitis B infection. Hepatitis C  Blood testing is recommended for:  Everyone born from [birth year] through 1963.  Anyone with known risk factors for hepatitis C. Sexually Transmitted Diseases (STDs)  You should be screened each year for STDs including gonorrhea and chlamydia if:  You are sexually active and are younger than 81 years of age.  You are older than 81 years of age and your health care provider tells you that you are at risk for this type of infection.  Your sexual activity has changed since you were last screened and you are at an increased risk for chlamydia or gonorrhea. Ask your health care provider if you are at risk.  Talk with your health care provider about whether you are at high risk of being infected with HIV. Your health care provider may recommend a prescription medicine to help prevent HIV infection. What else can I do?  Schedule regular health,  dental, and eye exams.  Stay current with your vaccines (immunizations).  Do not use any tobacco products, such as cigarettes, chewing tobacco, and e-cigarettes. If you need help quitting, ask your health care provider.  Limit alcohol intake to no more than 2 drinks per day. One drink equals 12 ounces of beer, 5 ounces of wine, or 1 ounces of hard liquor.  Do not use street drugs.  Do not share needles.  Ask your health care provider for help if you need support or information about quitting drugs.  Tell your health care provider if you often feel depressed.  Tell your health care provider if you have ever been abused or do not feel safe at home. This information is not intended to replace advice given to you by your health care provider. Make sure you discuss any questions you have with your health care provider. Document Released: 11/05/2007 Document Revised: 01/06/2016  Document Reviewed: 02/10/2015 Elsevier Interactive Patient Education  2017 [employer].

## 2016-08-16 NOTE — Assessment & Plan Note (Signed)
Chronic, stable. Great control on statin - continue

## 2016-08-16 NOTE — Assessment & Plan Note (Signed)
Not appreciated today. Consider rpt.

## 2016-08-17 NOTE — Progress Notes (Signed)
I reviewed health advisor's note, was available for consultation, and agree with documentation and plan.  

## 2016-08-26 DIAGNOSIS — H401133 Primary open-angle glaucoma, bilateral, severe stage: Secondary | ICD-10-CM | POA: Diagnosis not present

## 2016-08-29 ENCOUNTER — Inpatient Hospital Stay: Payer: Medicare PPO | Attending: Internal Medicine

## 2016-08-29 DIAGNOSIS — K219 Gastro-esophageal reflux disease without esophagitis: Secondary | ICD-10-CM | POA: Insufficient documentation

## 2016-08-29 DIAGNOSIS — C61 Malignant neoplasm of prostate: Secondary | ICD-10-CM | POA: Diagnosis not present

## 2016-08-29 DIAGNOSIS — K589 Irritable bowel syndrome without diarrhea: Secondary | ICD-10-CM | POA: Insufficient documentation

## 2016-08-29 DIAGNOSIS — D649 Anemia, unspecified: Secondary | ICD-10-CM | POA: Diagnosis not present

## 2016-08-29 DIAGNOSIS — Z923 Personal history of irradiation: Secondary | ICD-10-CM | POA: Diagnosis not present

## 2016-08-29 DIAGNOSIS — I10 Essential (primary) hypertension: Secondary | ICD-10-CM | POA: Diagnosis not present

## 2016-08-29 DIAGNOSIS — Z87891 Personal history of nicotine dependence: Secondary | ICD-10-CM | POA: Insufficient documentation

## 2016-08-29 DIAGNOSIS — Z79818 Long term (current) use of other agents affecting estrogen receptors and estrogen levels: Secondary | ICD-10-CM | POA: Insufficient documentation

## 2016-08-29 DIAGNOSIS — Z7982 Long term (current) use of aspirin: Secondary | ICD-10-CM | POA: Diagnosis not present

## 2016-08-29 DIAGNOSIS — Z8546 Personal history of malignant neoplasm of prostate: Secondary | ICD-10-CM | POA: Diagnosis not present

## 2016-08-29 DIAGNOSIS — R232 Flushing: Secondary | ICD-10-CM | POA: Insufficient documentation

## 2016-08-29 DIAGNOSIS — Z86010 Personal history of colonic polyps: Secondary | ICD-10-CM | POA: Diagnosis not present

## 2016-08-29 DIAGNOSIS — E785 Hyperlipidemia, unspecified: Secondary | ICD-10-CM | POA: Insufficient documentation

## 2016-08-29 DIAGNOSIS — Z79899 Other long term (current) drug therapy: Secondary | ICD-10-CM | POA: Diagnosis not present

## 2016-08-29 LAB — CBC WITH DIFFERENTIAL/PLATELET
BASOS ABS: 0 10*3/uL (ref 0–0.1)
BASOS PCT: 0 %
Eosinophils Absolute: 0.1 10*3/uL (ref 0–0.7)
Eosinophils Relative: 2 %
HEMATOCRIT: 32.1 % — AB (ref 40.0–52.0)
HEMOGLOBIN: 10.6 g/dL — AB (ref 13.0–18.0)
LYMPHS PCT: 20 %
Lymphs Abs: 1.1 10*3/uL (ref 1.0–3.6)
MCH: 28.2 pg (ref 26.0–34.0)
MCHC: 32.9 g/dL (ref 32.0–36.0)
MCV: 85.7 fL (ref 80.0–100.0)
MONO ABS: 0.3 10*3/uL (ref 0.2–1.0)
Monocytes Relative: 6 %
NEUTROS PCT: 72 %
Neutro Abs: 4.1 10*3/uL (ref 1.4–6.5)
Platelets: 233 10*3/uL (ref 150–440)
RBC: 3.75 MIL/uL — AB (ref 4.40–5.90)
RDW: 15.3 % — AB (ref 11.5–14.5)
WBC: 5.7 10*3/uL (ref 3.8–10.6)

## 2016-08-29 LAB — COMPREHENSIVE METABOLIC PANEL
ALBUMIN: 3.7 g/dL (ref 3.5–5.0)
ALT: 14 U/L — AB (ref 17–63)
AST: 20 U/L (ref 15–41)
Alkaline Phosphatase: 61 U/L (ref 38–126)
Anion gap: 9 (ref 5–15)
BILIRUBIN TOTAL: 0.6 mg/dL (ref 0.3–1.2)
BUN: 34 mg/dL — AB (ref 6–20)
CO2: 25 mmol/L (ref 22–32)
CREATININE: 1.06 mg/dL (ref 0.61–1.24)
Calcium: 9.4 mg/dL (ref 8.9–10.3)
Chloride: 101 mmol/L (ref 101–111)
GFR calc Af Amer: >60 mL/min (ref >60)
GLUCOSE: 149 mg/dL — AB (ref 65–99)
POTASSIUM: 3.7 mmol/L (ref 3.5–5.1)
Sodium: 135 mmol/L (ref 135–145)
TOTAL PROTEIN: 7.6 g/dL (ref 6.5–8.1)

## 2016-08-29 LAB — PSA: PSA: 0.58 ng/mL (ref 0.00–4.00)

## 2016-08-31 ENCOUNTER — Inpatient Hospital Stay (HOSPITAL_BASED_OUTPATIENT_CLINIC_OR_DEPARTMENT_OTHER): Payer: Medicare PPO | Admitting: Internal Medicine

## 2016-08-31 ENCOUNTER — Inpatient Hospital Stay: Payer: Medicare PPO

## 2016-08-31 VITALS — BP 125/69 | HR 47 | Temp 95.9°F | Resp 16 | Wt 150.1 lb

## 2016-08-31 DIAGNOSIS — Z923 Personal history of irradiation: Secondary | ICD-10-CM

## 2016-08-31 DIAGNOSIS — K589 Irritable bowel syndrome without diarrhea: Secondary | ICD-10-CM

## 2016-08-31 DIAGNOSIS — Z79899 Other long term (current) drug therapy: Secondary | ICD-10-CM | POA: Diagnosis not present

## 2016-08-31 DIAGNOSIS — Z8546 Personal history of malignant neoplasm of prostate: Secondary | ICD-10-CM | POA: Diagnosis not present

## 2016-08-31 DIAGNOSIS — D649 Anemia, unspecified: Secondary | ICD-10-CM

## 2016-08-31 DIAGNOSIS — Z7982 Long term (current) use of aspirin: Secondary | ICD-10-CM

## 2016-08-31 DIAGNOSIS — R232 Flushing: Secondary | ICD-10-CM | POA: Diagnosis not present

## 2016-08-31 DIAGNOSIS — I10 Essential (primary) hypertension: Secondary | ICD-10-CM

## 2016-08-31 DIAGNOSIS — Z79818 Long term (current) use of other agents affecting estrogen receptors and estrogen levels: Secondary | ICD-10-CM | POA: Diagnosis not present

## 2016-08-31 DIAGNOSIS — Z87891 Personal history of nicotine dependence: Secondary | ICD-10-CM

## 2016-08-31 DIAGNOSIS — C61 Malignant neoplasm of prostate: Secondary | ICD-10-CM

## 2016-08-31 DIAGNOSIS — E785 Hyperlipidemia, unspecified: Secondary | ICD-10-CM

## 2016-08-31 DIAGNOSIS — Z86010 Personal history of colonic polyps: Secondary | ICD-10-CM

## 2016-08-31 DIAGNOSIS — K219 Gastro-esophageal reflux disease without esophagitis: Secondary | ICD-10-CM

## 2016-08-31 MED ORDER — LEUPROLIDE ACETATE (3 MONTH) 22.5 MG IM KIT
22.5000 mg | PACK | Freq: Once | INTRAMUSCULAR | Status: AC
  Administered 2016-08-31: 22.5 mg via INTRAMUSCULAR
  Filled 2016-08-31: qty 22.5

## 2016-08-31 NOTE — Progress Notes (Signed)
Patient here today today for follow up.  Patient states no new concerns today

## 2016-08-31 NOTE — Progress Notes (Signed)
[facility] CONSULT NOTE  Patient Care Team: [name], MD as PCP - General (Family Medicine) Rondel Oh, MD as Consulting Physician (Ophthalmology)  CHIEF COMPLAINTS/PURPOSE OF CONSULTATION:  Oncology History   # external beam radiation therapy and radiation seed implant in 2005 with Dr. Joelyn Oms and Dr. Danny Lawless for T1c Gleason 3+4 = 7 prostate cancer with a pretreatment PSA of 6.8. In November 2015, he was found to have biochemical recurrence.  Dec 2016- CT a/p & Bone scan- NEG; PSA- 11; June 2017- 19; STARTED on Firmagon [Dr.Budzyn]; on Lupron q 5M  # JAN 2018- Dexa scan- N     Primary adenocarcinoma of prostate ([place])    Initial Diagnosis    Primary adenocarcinoma of prostate ([place])        HISTORY OF PRESENTING ILLNESS:  Thomas Lopez 83 y.o.  male very pleasant patient with above history of prostate cancer - Currently with recurrent prostate cancer/biochemical recurrence- hormone sensitive on ADT is here for follow-up.  Denies any blood in stools or black stools. Hot flashes improved. Denies any bone pain. He is not losing any weight. Denies any difficulty in urination. No chest pain or shortness of breath or cough. No blood in urine. Denies any significant fatigue.  ROS: A complete 10 point review of system is done which is negative except mentioned above in history of present illness  MEDICAL HISTORY:  Past Medical History:  Diagnosis Date  . Aorto-iliac atherosclerosis ([place]) 04/2015   by CT scan  . Carotid stenosis 11/2014   mild bilateral 1-39%, rpt 2 yrs  . Colon polyps    rpt colonoscopy due 2014  . ED (erectile dysfunction)    Trimix and VED  . Esophagitis   . Essential hypertension 06/29/2009  . GERD (gastroesophageal reflux disease)   . Glaucoma    severe ([place])  . HLD (hyperlipidemia)   . HTN (hypertension)   . Hyperglycemia   . IBS (irritable bowel syndrome)   . Prostate cancer [facility]) 2005   s/p seed implant, EBRT (Dr. Alinda Money  at [facility]) recurrent treating with androgen deprivation referred to cancer center  . Smoking history     SURGICAL HISTORY: Past Surgical History:  Procedure Laterality Date  . CATARACT EXTRACTION, BILATERAL  December 2016   [place], Dr. Raynelle Fanning  . COLONOSCOPY  03/01/01   Multiple polyps, divertic//adenomatous polyps  . COLONOSCOPY  04/24/01   multiple polyps  . COLONOSCOPY  10/23/01   Polyps benign-repeat every 2 years  . COLONOSCOPY  06/23/04   Adenom/hyperplastic polyps; multiple external hemorrhoids  . COLONOSCOPY  06/08/07   single small polyp-repeat 5 years  . ESOPHAGOGASTRODUODENOSCOPY  03/01/01   H.H.; gastritis esoph. dudodenitis  . NCS/Wrists Left Carpal  2/02   Min./right improved  . PFTs  10/2012   FVC 64%, FEV1 41%, ratio 0.62  . PROSTATE BIOPSY  12/04   positive; radioactive seed implant (Dr. Joelyn Oms)  . Prostate Ext Beam Radiation  1/27-07/23/03   Dr. Danny Lawless  . SPIROMETRY  09/2011   WNL  . Wrist Release  6/98; 9/99   Right    SOCIAL HISTORY: Social History   Social History  . Marital status: Legally Separated    Spouse name: N/A  . Number of children: 5  . Years of education: N/A   Occupational History  . Retired    Social History Main Topics  . Smoking status: Former Smoker    Quit date: 05/23/1993  . Smokeless tobacco: Never  Used  . Alcohol use Yes     Comment: Rare  . Drug use: No  . Sexual activity: Yes   Other Topics Concern  . 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 Cancer Mother     colon  . Hyperlipidemia Mother   . Other Mother     Carotid disease  . Coronary artery disease Neg Hx   . Stroke Neg Hx     ALLERGIES:  has No Known Allergies.  MEDICATIONS:  Current  Outpatient Prescriptions  Medication Sig Dispense Refill  . aspirin EC 81 MG tablet Take 81 mg by mouth daily.    . brimonidine (ALPHAGAN P) 0.1 % SOLN Apply 1 drop to eye at bedtime.    . dorzolamide-timolol (COSOPT) 22.3-6.8 MG/ML ophthalmic solution As directed    . latanoprost (XALATAN) 0.005 % ophthalmic solution Place 1 drop into both eyes at bedtime.    Marland Kitchen leuprolide (LUPRON) 22.5 MG injection Inject 22.5 mg into the muscle every 3 (three) months.    Marland Kitchen lisinopril (PRINIVIL,ZESTRIL) 10 MG tablet Take 10 mg by mouth daily.    . methylcellulose packet Take 1 each by mouth as needed for constipation. Reported on 11/05/2015    . omeprazole (PRILOSEC) 40 MG capsule take 1 capsule by mouth once daily 90 capsule 3  . rosuvastatin (CRESTOR) 10 MG tablet Take 1 tablet (10 mg total) by mouth daily. 90 tablet 3  . venlafaxine XR (EFFEXOR-XR) 37.5 MG 24 hr capsule Take 1 capsule (37.5 mg total) by mouth daily with breakfast. 90 capsule 0   No current facility-administered medications for this visit.       Marland Kitchen  PHYSICAL EXAMINATION: ECOG PERFORMANCE STATUS: 0 - Asymptomatic  Vitals:   08/31/16 1349  BP: 125/69  Pulse: (!) 47  Resp: 16  Temp: (!) 95.9 F (35.5 C)   Filed Weights   08/31/16 1349  Weight: 150 lb 2 oz (68.1 kg)    GENERAL: Well-nourished well-developed; Alert, no distress and comfortable.   Alone.  EYES: no pallor or icterus OROPHARYNX: no thrush or ulceration; good dentition  NECK: supple, no masses felt LYMPH:  no palpable lymphadenopathy in the cervical, axillary or inguinal regions LUNGS: clear to auscultation and  No wheeze or crackles HEART/CVS: regular rate & rhythm and no murmurs; No lower extremity edema ABDOMEN: abdomen soft, non-tender and normal bowel sounds Musculoskeletal:no cyanosis of digits and no clubbing  PSYCH: alert & oriented x 3 with fluent speech NEURO: no focal motor/sensory deficits SKIN:  no rashes or significant lesions  LABORATORY DATA:   I have reviewed the data as listed Lab Results  Component Value Date   WBC 5.7 08/29/2016   HGB 10.6 (L) 08/29/2016   HCT 32.1 (L) 08/29/2016   MCV 85.7 08/29/2016   PLT 233 08/29/2016    Recent Labs  03/03/16 0935 06/02/16 0926 08/29/16 0945  NA 138 133* 135  K 4.2 4.4 3.7  CL 104 103 101  CO2 25 26 25   GLUCOSE 125* 158* 149*  BUN 43* 42* 34*  CREATININE 1.14 1.04 1.06  CALCIUM 9.6 9.4 9.4  GFRNONAA 58* >60 >60  GFRAA >60 >60 >60  PROT 7.7 7.5 7.6  ALBUMIN 4.3 4.2 3.7  AST 21 30 20   ALT 19 23 14*  ALKPHOS 58 55 61  BILITOT 0.7 0.6 0.6    RADIOGRAPHIC STUDIES: I have personally reviewed the radiological images as listed and agreed with the findings in the report. No results found.  ASSESSMENT & PLAN:   Primary adenocarcinoma of prostate ([place]) Metastatic prostate cancer Hormone sensitive- biochemical recurrence [M0]- given the rapid doubling time of PSA patient on- ADT every 3 months. PSA  April 2018- 0.58. Tolerating ADT well. If patient has castrate resistant disease; and nonmetastatic- he may be a good candidate for recently approved apalutamdide.   # Hot flashes- improved on effexor.   # Anemia- Hb 10.5 not iron deficient; monitor for now [previous colonoscopy-2016- unsuccessful sec to tortuosity; Dr.jacobs ]; recommend stool occult x2. Repeat ida studies at next visit. Work up for anemia at next visit.   # DEXA scan jan 2018- NO osteoporosis; recommend Ca+ vit D  #Follow with me labs PSA Lupron shot in 3 months; labs- prior.      Cammie Sickle, MD 08/31/2016 5:01 PM

## 2016-08-31 NOTE — Assessment & Plan Note (Addendum)
Metastatic prostate cancer Hormone sensitive- biochemical recurrence [M0]- given the rapid doubling time of PSA patient on- ADT every 3 months. PSA  April 2018- 0.58. Tolerating ADT well. If patient has castrate resistant disease; and nonmetastatic- he may be a good candidate for recently approved apalutamdide.   # Hot flashes- improved on effexor.   # Anemia- Hb 10.5 not iron deficient; monitor for now [previous colonoscopy-2016- unsuccessful sec to tortuosity; Dr.jacobs ]; recommend stool occult x2. Repeat ida studies at next visit. Work up for anemia at next visit.   # DEXA scan jan 2018- NO osteoporosis; recommend Ca+ vit D  #Follow with me labs PSA Lupron shot in 3 months; labs- prior.

## 2016-09-01 ENCOUNTER — Other Ambulatory Visit: Payer: Self-pay | Admitting: *Deleted

## 2016-09-01 DIAGNOSIS — C61 Malignant neoplasm of prostate: Secondary | ICD-10-CM

## 2016-09-01 MED ORDER — LISINOPRIL 10 MG PO TABS: 10.0000 mg | ORAL_TABLET | Freq: Every day | ORAL | 3 refills | Status: DC

## 2016-09-01 MED ORDER — ROSUVASTATIN CALCIUM 10 MG PO TABS: 10.0000 mg | ORAL_TABLET | Freq: Every day | ORAL | 3 refills | Status: DC

## 2016-09-01 MED ORDER — VENLAFAXINE HCL ER 37.5 MG PO CP24: 37.5000 mg | ORAL_CAPSULE | Freq: Every day | ORAL | 3 refills | Status: DC

## 2016-09-01 NOTE — Telephone Encounter (Signed)
Pt came into office requesting refill, and had letter indicating lisinopril no longer covered. Benazapril is on pts formulary. Routed to PCP to determine if pt will continue with current medication or be prescribed new. pls advise

## 2016-09-01 NOTE — Telephone Encounter (Signed)
plz check with patient -  Lisinopril is on 4$ formulary at [place], $10 for 90 days. Would see if he's willing to get med through [facility] and then send in there. If not, we can switch to benazepril 10mg  daily but he would need to monitor BP to ensure remains well controlled.  I refilled other 2 meds to [facility].

## 2016-09-19 DIAGNOSIS — H401133 Primary open-angle glaucoma, bilateral, severe stage: Secondary | ICD-10-CM | POA: Diagnosis not present

## 2016-09-27 DIAGNOSIS — H401123 Primary open-angle glaucoma, left eye, severe stage: Secondary | ICD-10-CM | POA: Diagnosis not present

## 2016-09-28 DIAGNOSIS — Z9889 Other specified postprocedural states: Secondary | ICD-10-CM | POA: Diagnosis not present

## 2016-09-28 DIAGNOSIS — H401133 Primary open-angle glaucoma, bilateral, severe stage: Secondary | ICD-10-CM | POA: Diagnosis not present

## 2016-11-16 ENCOUNTER — Other Ambulatory Visit: Payer: Self-pay | Admitting: Family Medicine

## 2016-11-16 DIAGNOSIS — I739 Peripheral vascular disease, unspecified: Principal | ICD-10-CM

## 2016-11-16 DIAGNOSIS — I779 Disorder of arteries and arterioles, unspecified: Secondary | ICD-10-CM

## 2016-11-17 DIAGNOSIS — Z6823 Body mass index (BMI) 23.0-23.9, adult: Secondary | ICD-10-CM | POA: Diagnosis not present

## 2016-11-17 DIAGNOSIS — E785 Hyperlipidemia, unspecified: Secondary | ICD-10-CM | POA: Diagnosis not present

## 2016-11-17 DIAGNOSIS — H409 Unspecified glaucoma: Secondary | ICD-10-CM | POA: Diagnosis not present

## 2016-11-17 DIAGNOSIS — I10 Essential (primary) hypertension: Secondary | ICD-10-CM | POA: Diagnosis not present

## 2016-11-17 DIAGNOSIS — F3340 Major depressive disorder, recurrent, in remission, unspecified: Secondary | ICD-10-CM | POA: Diagnosis not present

## 2016-11-28 ENCOUNTER — Inpatient Hospital Stay: Payer: Medicare PPO

## 2016-11-28 ENCOUNTER — Other Ambulatory Visit: Payer: Self-pay

## 2016-11-28 DIAGNOSIS — Z79899 Other long term (current) drug therapy: Secondary | ICD-10-CM | POA: Diagnosis not present

## 2016-11-28 DIAGNOSIS — C61 Malignant neoplasm of prostate: Secondary | ICD-10-CM | POA: Insufficient documentation

## 2016-11-28 DIAGNOSIS — D509 Iron deficiency anemia, unspecified: Secondary | ICD-10-CM | POA: Insufficient documentation

## 2016-11-28 DIAGNOSIS — R232 Flushing: Secondary | ICD-10-CM | POA: Insufficient documentation

## 2016-11-28 DIAGNOSIS — Z79818 Long term (current) use of other agents affecting estrogen receptors and estrogen levels: Secondary | ICD-10-CM | POA: Diagnosis not present

## 2016-11-28 DIAGNOSIS — H409 Unspecified glaucoma: Secondary | ICD-10-CM | POA: Insufficient documentation

## 2016-11-28 DIAGNOSIS — K589 Irritable bowel syndrome without diarrhea: Secondary | ICD-10-CM | POA: Diagnosis not present

## 2016-11-28 DIAGNOSIS — I10 Essential (primary) hypertension: Secondary | ICD-10-CM | POA: Insufficient documentation

## 2016-11-28 DIAGNOSIS — Z923 Personal history of irradiation: Secondary | ICD-10-CM | POA: Diagnosis not present

## 2016-11-28 DIAGNOSIS — Z7982 Long term (current) use of aspirin: Secondary | ICD-10-CM | POA: Diagnosis not present

## 2016-11-28 DIAGNOSIS — E785 Hyperlipidemia, unspecified: Secondary | ICD-10-CM | POA: Insufficient documentation

## 2016-11-28 DIAGNOSIS — Z87891 Personal history of nicotine dependence: Secondary | ICD-10-CM | POA: Insufficient documentation

## 2016-11-28 DIAGNOSIS — K219 Gastro-esophageal reflux disease without esophagitis: Secondary | ICD-10-CM | POA: Insufficient documentation

## 2016-11-28 LAB — CBC WITH DIFFERENTIAL/PLATELET
BASOS PCT: 0 %
Basophils Absolute: 0 10*3/uL (ref 0–0.1)
EOS ABS: 0.1 10*3/uL (ref 0–0.7)
EOS PCT: 2 %
HCT: 31 % — ABNORMAL LOW (ref 40.0–52.0)
HEMOGLOBIN: 10.1 g/dL — AB (ref 13.0–18.0)
LYMPHS ABS: 1 10*3/uL (ref 1.0–3.6)
Lymphocytes Relative: 19 %
MCH: 27.7 pg (ref 26.0–34.0)
MCHC: 32.7 g/dL (ref 32.0–36.0)
MCV: 84.7 fL (ref 80.0–100.0)
MONOS PCT: 8 %
Monocytes Absolute: 0.4 10*3/uL (ref 0.2–1.0)
NEUTROS PCT: 71 %
Neutro Abs: 3.8 10*3/uL (ref 1.4–6.5)
PLATELETS: 262 10*3/uL (ref 150–440)
RBC: 3.66 MIL/uL — ABNORMAL LOW (ref 4.40–5.90)
RDW: 14.6 % — AB (ref 11.5–14.5)
WBC: 5.3 10*3/uL (ref 3.8–10.6)

## 2016-11-28 LAB — BASIC METABOLIC PANEL
Anion gap: 8 (ref 5–15)
BUN: 25 mg/dL — ABNORMAL HIGH (ref 6–20)
CHLORIDE: 103 mmol/L (ref 101–111)
CO2: 26 mmol/L (ref 22–32)
CREATININE: 0.95 mg/dL (ref 0.61–1.24)
Calcium: 9.6 mg/dL (ref 8.9–10.3)
GFR calc non Af Amer: >60 mL/min (ref >60)
Glucose, Bld: 97 mg/dL (ref 65–99)
POTASSIUM: 4.3 mmol/L (ref 3.5–5.1)
SODIUM: 137 mmol/L (ref 135–145)

## 2016-11-28 LAB — IRON AND TIBC
Iron: 41 ug/dL — ABNORMAL LOW (ref 45–182)
Saturation Ratios: 12 % — ABNORMAL LOW (ref 17.9–39.5)
TIBC: 345 ug/dL (ref 250–450)
UIBC: 304 ug/dL

## 2016-11-28 LAB — FERRITIN: FERRITIN: 117 ng/mL (ref 24–336)

## 2016-11-28 LAB — VITAMIN B12: Vitamin B-12: 514 pg/mL (ref 180–914)

## 2016-11-28 LAB — FOLATE: FOLATE: 19.6 ng/mL (ref >5.9)

## 2016-11-29 LAB — KAPPA/LAMBDA LIGHT CHAINS
KAPPA FREE LGHT CHN: 22.3 mg/L — AB (ref 3.3–19.4)
Kappa, lambda light chain ratio: 1.06 (ref 0.26–1.65)
Lambda free light chains: 21.1 mg/L (ref 5.7–26.3)

## 2016-11-30 ENCOUNTER — Inpatient Hospital Stay: Payer: Medicare PPO | Attending: Internal Medicine | Admitting: Internal Medicine

## 2016-11-30 ENCOUNTER — Inpatient Hospital Stay: Payer: Medicare PPO

## 2016-11-30 VITALS — BP 111/59 | HR 50 | Temp 95.5°F | Wt 156.0 lb

## 2016-11-30 DIAGNOSIS — Z923 Personal history of irradiation: Secondary | ICD-10-CM

## 2016-11-30 DIAGNOSIS — C61 Malignant neoplasm of prostate: Secondary | ICD-10-CM

## 2016-11-30 DIAGNOSIS — Z7982 Long term (current) use of aspirin: Secondary | ICD-10-CM

## 2016-11-30 DIAGNOSIS — K219 Gastro-esophageal reflux disease without esophagitis: Secondary | ICD-10-CM

## 2016-11-30 DIAGNOSIS — I10 Essential (primary) hypertension: Secondary | ICD-10-CM

## 2016-11-30 DIAGNOSIS — Z79818 Long term (current) use of other agents affecting estrogen receptors and estrogen levels: Secondary | ICD-10-CM

## 2016-11-30 DIAGNOSIS — R232 Flushing: Secondary | ICD-10-CM | POA: Diagnosis not present

## 2016-11-30 DIAGNOSIS — D509 Iron deficiency anemia, unspecified: Secondary | ICD-10-CM

## 2016-11-30 DIAGNOSIS — Z87891 Personal history of nicotine dependence: Secondary | ICD-10-CM

## 2016-11-30 DIAGNOSIS — K589 Irritable bowel syndrome without diarrhea: Secondary | ICD-10-CM

## 2016-11-30 DIAGNOSIS — H409 Unspecified glaucoma: Secondary | ICD-10-CM | POA: Diagnosis not present

## 2016-11-30 DIAGNOSIS — Z79899 Other long term (current) drug therapy: Secondary | ICD-10-CM | POA: Diagnosis not present

## 2016-11-30 DIAGNOSIS — E785 Hyperlipidemia, unspecified: Secondary | ICD-10-CM

## 2016-11-30 LAB — MULTIPLE MYELOMA PANEL, SERUM
ALBUMIN/GLOB SERPL: 1 (ref 0.7–1.7)
ALPHA2 GLOB SERPL ELPH-MCNC: 1 g/dL (ref 0.4–1.0)
Albumin SerPl Elph-Mcnc: 3.6 g/dL (ref 2.9–4.4)
Alpha 1: 0.3 g/dL (ref 0.0–0.4)
B-Globulin SerPl Elph-Mcnc: 1.4 g/dL — ABNORMAL HIGH (ref 0.7–1.3)
Gamma Glob SerPl Elph-Mcnc: 1.1 g/dL (ref 0.4–1.8)
Globulin, Total: 3.8 g/dL (ref 2.2–3.9)
IGA: 363 mg/dL (ref 61–437)
IGM, SERUM: 41 mg/dL (ref 15–143)
IgG (Immunoglobin G), Serum: 1212 mg/dL (ref 700–1600)
TOTAL PROTEIN ELP: 7.4 g/dL (ref 6.0–8.5)

## 2016-11-30 MED ORDER — LEUPROLIDE ACETATE (3 MONTH) 22.5 MG IM KIT
22.5000 mg | PACK | Freq: Once | INTRAMUSCULAR | Status: AC
  Administered 2016-11-30: 22.5 mg via INTRAMUSCULAR

## 2016-11-30 MED ORDER — ACETAMINOPHEN 325 MG PO TABS: 650.0000 mg | ORAL_TABLET | Freq: Once | ORAL | Status: DC

## 2016-11-30 NOTE — Assessment & Plan Note (Addendum)
Metastatic prostate cancer Hormone sensitive- biochemical recurrence [M0]- given the rapid doubling time of PSA patient on- ADT every 3 months. PSA  April 2018- 0.58. Tolerating ADT well.  # When patient turnscastrate resistant disease; and nonmetastatic- he may be a good candidate for recently approved apalutamdide/enzakutamide  # Hot flashes- improved on effexor.   # Anemia- Hb 10.5 ? Iron deficient [sat 7%] monitor for now [previous colonoscopy-2016- unsuccessful sec to tortuosity; Dr.jacobs ]; recommend stool occult x2.  Recommend PO iron.   # DEXA scan jan 2018- NO osteoporosis; recommend Ca+ vit D  #Follow with me labs PSA Lupron shot in 3 months; labs- prior.; stool cards x2.

## 2016-11-30 NOTE — Progress Notes (Signed)
[place] NOTE  Patient Care Team: [name], MD as PCP - General (Family Medicine) Bond, Tracie Harrier, MD as Consulting Physician (Ophthalmology)  CHIEF COMPLAINTS/PURPOSE OF CONSULTATION:  Oncology History   # external beam radiation therapy and radiation seed implant in 2005 with Dr. Joelyn Oms and Dr. Danny Lawless for T1c Gleason 3+4 = 7 prostate cancer with a pretreatment PSA of 6.8. In November 2015, he was found to have biochemical recurrence.  Dec 2016- CT a/p & Bone scan- NEG; PSA- 11; June 2017- 19; STARTED on Firmagon [Dr.Budzyn]; on Lupron q 54M  # JAN 2018- Dexa scan- N     Primary adenocarcinoma of prostate ([place])    Initial Diagnosis    Primary adenocarcinoma of prostate ([place])        HISTORY OF PRESENTING ILLNESS:  Thomas Lopez [birth year] 83 y.o.  male very pleasant patient with above history of prostate cancer - Currently with recurrent prostate cancer/biochemical recurrence- hormone sensitive on ADT is here for follow-up.  Denies any difficulty in urination. No chest pain or shortness of breath or cough. No blood in urine. Denies any significant fatigue. Denies any blood in stools or black stools. Hot flashes improved. Denies any bone pain. He is not losing any weight.   ROS: A complete 10 point review of system is done which is negative except mentioned above in history of present illness  MEDICAL HISTORY:  Past Medical History:  Diagnosis Date  . Aorto-iliac atherosclerosis ([place]) 04/2015   by CT scan  . Carotid stenosis 11/2014   mild bilateral 1-39%, rpt 2 yrs  . Colon polyps    rpt colonoscopy due 2014  . ED (erectile dysfunction)    Trimix and VED  . Esophagitis   . Essential hypertension 06/29/2009  . GERD (gastroesophageal reflux disease)   . Glaucoma    severe ([place])  . HLD (hyperlipidemia)   . HTN (hypertension)   . Hyperglycemia   . IBS (irritable bowel syndrome)   . Prostate cancer [facility]) 2005   s/p seed implant, EBRT (Dr.  Alinda Money at [facility]) recurrent treating with androgen deprivation referred to cancer center  . Smoking history     SURGICAL HISTORY: Past Surgical History:  Procedure Laterality Date  . CATARACT EXTRACTION, BILATERAL  December 2016   [place], Dr. Raynelle Fanning  . COLONOSCOPY  03/01/01   Multiple polyps, divertic//adenomatous polyps  . COLONOSCOPY  04/24/01   multiple polyps  . COLONOSCOPY  10/23/01   Polyps benign-repeat every 2 years  . COLONOSCOPY  06/23/04   Adenom/hyperplastic polyps; multiple external hemorrhoids  . COLONOSCOPY  06/08/07   single small polyp-repeat 5 years  . ESOPHAGOGASTRODUODENOSCOPY  03/01/01   H.H.; gastritis esoph. dudodenitis  . NCS/Wrists Left Carpal  2/02   Min./right improved  . PFTs  10/2012   FVC 64%, FEV1 41%, ratio 0.62  . PROSTATE BIOPSY  12/04   positive; radioactive seed implant (Dr. Joelyn Oms)  . Prostate Ext Beam Radiation  1/27-07/23/03   Dr. Danny Lawless  . SPIROMETRY  09/2011   WNL  . Wrist Release  6/98; 9/99   Right    SOCIAL HISTORY: Social History   Social History  . Marital status: Legally Separated    Spouse name: N/A  . Number of children: 5  . Years of education: N/A   Occupational History  . Retired    Social History Main Topics  . Smoking status: Former Smoker    Quit date: 05/23/1993  . Smokeless tobacco:  Never Used  . Alcohol use Yes     Comment: Rare  . Drug use: No  . Sexual activity: Yes   Other Topics Concern  . 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 Cancer Mother        colon  . Hyperlipidemia Mother   . Other Mother        Carotid disease  . Coronary artery disease Neg Hx   . Stroke Neg Hx     ALLERGIES:  has No Known  Allergies.  MEDICATIONS:  Current Outpatient Prescriptions  Medication Sig Dispense Refill  . aspirin EC 81 MG tablet Take 81 mg by mouth daily.    . brimonidine (ALPHAGAN P) 0.1 % SOLN Apply 1 drop to eye at bedtime.    . dorzolamide-timolol (COSOPT) 22.3-6.8 MG/ML ophthalmic solution As directed    . latanoprost (XALATAN) 0.005 % ophthalmic solution Place 1 drop into both eyes at bedtime.    Marland Kitchen leuprolide (LUPRON) 22.5 MG injection Inject 22.5 mg into the muscle every 3 (three) months.    Marland Kitchen lisinopril (PRINIVIL,ZESTRIL) 10 MG tablet Take 1 tablet (10 mg total) by mouth daily. 90 tablet 3  . methylcellulose packet Take 1 each by mouth as needed for constipation. Reported on 11/05/2015    . omeprazole (PRILOSEC) 40 MG capsule take 1 capsule by mouth once daily 90 capsule 3  . rosuvastatin (CRESTOR) 10 MG tablet Take 1 tablet (10 mg total) by mouth daily. 90 tablet 3  . venlafaxine XR (EFFEXOR-XR) 37.5 MG 24 hr capsule Take 1 capsule (37.5 mg total) by mouth daily with breakfast. 90 capsule 3   No current facility-administered medications for this visit.       Marland Kitchen  PHYSICAL EXAMINATION: ECOG PERFORMANCE STATUS: 0 - Asymptomatic  Vitals:   11/30/16 1434  BP: (!) 111/59  Pulse: (!) 50  Temp: (!) 95.5 F (35.3 C)   Filed Weights   11/30/16 1434  Weight: 156 lb (70.8 kg)    GENERAL: Well-nourished well-developed; Alert, no distress and comfortable.   Alone.  EYES: no pallor or icterus OROPHARYNX: no thrush or ulceration; good dentition  NECK: supple, no masses felt LYMPH:  no palpable lymphadenopathy in the cervical, axillary or inguinal regions LUNGS: clear to auscultation and  No wheeze or crackles HEART/CVS: regular rate & rhythm and no murmurs; No lower extremity edema ABDOMEN: abdomen soft, non-tender and normal bowel sounds Musculoskeletal:no cyanosis of digits and no clubbing  PSYCH: alert & oriented x 3 with fluent speech NEURO: no focal motor/sensory deficits SKIN:  no  rashes or significant lesions  LABORATORY DATA:  I have reviewed the data as listed Lab Results  Component Value Date   WBC 5.3 11/28/2016   HGB 10.1 (L) 11/28/2016   HCT 31.0 (L) 11/28/2016   MCV 84.7 11/28/2016   PLT 262 11/28/2016    Recent Labs  03/03/16 0935 06/02/16 0926 08/29/16 0945 11/28/16 1335  NA 138 133* 135 137  K 4.2 4.4 3.7 4.3  CL 104 103 101 103  CO2 25 26 25 26   GLUCOSE 125* 158* 149* 97  BUN 43* 42* 34* 25*  CREATININE 1.14 1.04 1.06 0.95  CALCIUM 9.6 9.4 9.4 9.6  GFRNONAA 58* >  60 >60 >60  GFRAA >60 >60 >60 >60  PROT 7.7 7.5 7.6  --   ALBUMIN 4.3 4.2 3.7  --   AST 21 30 20   --   ALT 19 23 14*  --   ALKPHOS 58 55 61  --   BILITOT 0.7 0.6 0.6  --     RADIOGRAPHIC STUDIES: I have personally reviewed the radiological images as listed and agreed with the findings in the report. No results found.  ASSESSMENT & PLAN:   Primary adenocarcinoma of prostate ([place]) Metastatic prostate cancer Hormone sensitive- biochemical recurrence [M0]- given the rapid doubling time of PSA patient on- ADT every 3 months. PSA  April 2018- 0.58. Tolerating ADT well.  # When patient turnscastrate resistant disease; and nonmetastatic- he may be a good candidate for recently approved apalutamdide/enzakutamide  # Hot flashes- improved on effexor.   # Anemia- Hb 10.5 ? Iron deficient [sat 7%] monitor for now [previous colonoscopy-2016- unsuccessful sec to tortuosity; Dr.jacobs ]; recommend stool occult x2.  Recommend PO iron.   # DEXA scan jan 2018- NO osteoporosis; recommend Ca+ vit D  #Follow with me labs PSA Lupron shot in 3 months; labs- prior.; stool cards x2.      Thomas Sickle, MD 11/30/2016 6:01 PM

## 2016-12-01 ENCOUNTER — Ambulatory Visit: Payer: Medicare PPO

## 2016-12-01 DIAGNOSIS — I6523 Occlusion and stenosis of bilateral carotid arteries: Secondary | ICD-10-CM | POA: Diagnosis not present

## 2016-12-01 DIAGNOSIS — I739 Peripheral vascular disease, unspecified: Principal | ICD-10-CM

## 2016-12-01 DIAGNOSIS — I779 Disorder of arteries and arterioles, unspecified: Secondary | ICD-10-CM

## 2016-12-05 ENCOUNTER — Encounter: Payer: Self-pay | Admitting: *Deleted

## 2016-12-08 DIAGNOSIS — H409 Unspecified glaucoma: Secondary | ICD-10-CM | POA: Diagnosis not present

## 2016-12-08 DIAGNOSIS — E785 Hyperlipidemia, unspecified: Secondary | ICD-10-CM | POA: Diagnosis not present

## 2016-12-08 DIAGNOSIS — Z79818 Long term (current) use of other agents affecting estrogen receptors and estrogen levels: Secondary | ICD-10-CM | POA: Diagnosis not present

## 2016-12-08 DIAGNOSIS — Z923 Personal history of irradiation: Secondary | ICD-10-CM | POA: Diagnosis not present

## 2016-12-08 DIAGNOSIS — I10 Essential (primary) hypertension: Secondary | ICD-10-CM | POA: Diagnosis not present

## 2016-12-08 DIAGNOSIS — D509 Iron deficiency anemia, unspecified: Secondary | ICD-10-CM | POA: Diagnosis not present

## 2016-12-08 DIAGNOSIS — C61 Malignant neoplasm of prostate: Secondary | ICD-10-CM | POA: Diagnosis not present

## 2016-12-08 DIAGNOSIS — R232 Flushing: Secondary | ICD-10-CM | POA: Diagnosis not present

## 2016-12-08 DIAGNOSIS — K219 Gastro-esophageal reflux disease without esophagitis: Secondary | ICD-10-CM | POA: Diagnosis not present

## 2016-12-10 DIAGNOSIS — E785 Hyperlipidemia, unspecified: Secondary | ICD-10-CM | POA: Diagnosis not present

## 2016-12-10 DIAGNOSIS — I10 Essential (primary) hypertension: Secondary | ICD-10-CM | POA: Diagnosis not present

## 2016-12-10 DIAGNOSIS — Z923 Personal history of irradiation: Secondary | ICD-10-CM | POA: Diagnosis not present

## 2016-12-10 DIAGNOSIS — D509 Iron deficiency anemia, unspecified: Secondary | ICD-10-CM | POA: Diagnosis not present

## 2016-12-10 DIAGNOSIS — R232 Flushing: Secondary | ICD-10-CM | POA: Diagnosis not present

## 2016-12-10 DIAGNOSIS — K219 Gastro-esophageal reflux disease without esophagitis: Secondary | ICD-10-CM | POA: Diagnosis not present

## 2016-12-10 DIAGNOSIS — Z79818 Long term (current) use of other agents affecting estrogen receptors and estrogen levels: Secondary | ICD-10-CM | POA: Diagnosis not present

## 2016-12-10 DIAGNOSIS — H409 Unspecified glaucoma: Secondary | ICD-10-CM | POA: Diagnosis not present

## 2016-12-10 DIAGNOSIS — C61 Malignant neoplasm of prostate: Secondary | ICD-10-CM | POA: Diagnosis not present

## 2016-12-12 ENCOUNTER — Other Ambulatory Visit: Payer: Self-pay

## 2016-12-12 DIAGNOSIS — C61 Malignant neoplasm of prostate: Secondary | ICD-10-CM

## 2016-12-12 LAB — OCCULT BLOOD X 1 CARD TO LAB, STOOL
Fecal Occult Bld: NEGATIVE
Fecal Occult Bld: NEGATIVE

## 2017-01-30 DIAGNOSIS — H401133 Primary open-angle glaucoma, bilateral, severe stage: Secondary | ICD-10-CM | POA: Diagnosis not present

## 2017-02-14 DIAGNOSIS — H401113 Primary open-angle glaucoma, right eye, severe stage: Secondary | ICD-10-CM | POA: Diagnosis not present

## 2017-02-14 DIAGNOSIS — H401133 Primary open-angle glaucoma, bilateral, severe stage: Secondary | ICD-10-CM | POA: Diagnosis not present

## 2017-02-17 ENCOUNTER — Ambulatory Visit (INDEPENDENT_AMBULATORY_CARE_PROVIDER_SITE_OTHER): Payer: Medicare PPO | Admitting: Family Medicine

## 2017-02-17 ENCOUNTER — Encounter: Payer: Self-pay | Admitting: Family Medicine

## 2017-02-17 VITALS — BP 114/58 | HR 90 | Temp 97.6°F | Wt 148.8 lb

## 2017-02-17 DIAGNOSIS — Z23 Encounter for immunization: Secondary | ICD-10-CM

## 2017-02-17 DIAGNOSIS — I10 Essential (primary) hypertension: Secondary | ICD-10-CM | POA: Diagnosis not present

## 2017-02-17 DIAGNOSIS — E782 Mixed hyperlipidemia: Secondary | ICD-10-CM

## 2017-02-17 DIAGNOSIS — H401133 Primary open-angle glaucoma, bilateral, severe stage: Secondary | ICD-10-CM

## 2017-02-17 DIAGNOSIS — C61 Malignant neoplasm of prostate: Secondary | ICD-10-CM | POA: Diagnosis not present

## 2017-02-17 MED ORDER — OMEPRAZOLE 40 MG PO CPDR
40.0000 mg | DELAYED_RELEASE_CAPSULE | Freq: Every day | ORAL | 3 refills | Status: DC
Start: 2017-02-17 — End: 2017-10-25

## 2017-02-17 NOTE — Assessment & Plan Note (Signed)
Chronic, tolerating statin well. Continue The ASCVD Risk score Mikey Bussing DC Jr., et al., 2013) failed to calculate for the following reasons:   The 2013 ASCVD risk score is only valid for ages 28 to 29

## 2017-02-17 NOTE — Patient Instructions (Addendum)
Flu shot today.  You are doing well.  Return as needed or in 6 months for medicare wellness visit with Katha Cabal and physical with me. Try spacing out omeprazole reflux medicine to every other day and if tolerated, continue spacing out as tolerated.

## 2017-02-17 NOTE — Progress Notes (Signed)
BP (!) 114/58 (BP Location: Left Arm, Patient Position: Sitting, Cuff Size: Normal)   Pulse 90   Temp 97.6 F (36.4 C) (Oral)   Wt 148 lb 12 oz (67.5 kg)   SpO2 99%   BMI 22.78 kg/m    CC: 6 mo f/u visit Subjective:    Patient ID: Thomas Lopez, male    DOB: Oct 31, 1932, 83 y.o.   MRN: [id number]  HPI: Thomas Lopez is a 83 y.o. male presenting on 02/17/2017 for 6 mo follow-up   Glaucoma - followed regularly by ophtho Dr Edilia Bo had eye surgery on Tuesday.  Prostate cancer recurrence - followed by urology/oncology.   HTN - Compliant with current antihypertensive regimen of lisinopril 10mg  daily.  Does not check blood pressures at home. occasional low blood pressure readings without symptoms of dizziness/syncope. Denies HA, vision changes, CP/tightness, SOB, leg swelling.   HLD - compliant with crestor 10mg  daily without myalgias.   Relevant past medical, surgical, family and social history reviewed and updated as indicated. Interim medical history since our last visit reviewed. Allergies and medications reviewed and updated. Outpatient Medications Prior to Visit  Medication Sig Dispense Refill  . aspirin EC 81 MG tablet Take 81 mg by mouth daily.    . brimonidine (ALPHAGAN P) 0.1 % SOLN Apply 1 drop to eye at bedtime.    . dorzolamide-timolol (COSOPT) 22.3-6.8 MG/ML ophthalmic solution As directed    . latanoprost (XALATAN) 0.005 % ophthalmic solution Place 1 drop into both eyes at bedtime.    Marland Kitchen leuprolide (LUPRON) 22.5 MG injection Inject 22.5 mg into the muscle every 3 (three) months.    Marland Kitchen lisinopril (PRINIVIL,ZESTRIL) 10 MG tablet Take 1 tablet (10 mg total) by mouth daily. 90 tablet 3  . methylcellulose packet Take 1 each by mouth as needed for constipation. Reported on 11/05/2015    . rosuvastatin (CRESTOR) 10 MG tablet Take 1 tablet (10 mg total) by mouth daily. 90 tablet 3  . venlafaxine XR (EFFEXOR-XR) 37.5 MG 24 hr capsule Take 1 capsule (37.5 mg total) by mouth daily with  breakfast. 90 capsule 3  . omeprazole (PRILOSEC) 40 MG capsule take 1 capsule by mouth once daily 90 capsule 3   No facility-administered medications prior to visit.      Per HPI unless specifically indicated in ROS section below Review of Systems     Objective:    BP (!) 114/58 (BP Location: Left Arm, Patient Position: Sitting, Cuff Size: Normal)   Pulse 90   Temp 97.6 F (36.4 C) (Oral)   Wt 148 lb 12 oz (67.5 kg)   SpO2 99%   BMI 22.78 kg/m   Wt Readings from Last 3 Encounters:  02/17/17 148 lb 12 oz (67.5 kg)  11/30/16 156 lb (70.8 kg)  08/31/16 150 lb 2 oz (68.1 kg)    Physical Exam  Constitutional: He appears well-developed and well-nourished. No distress.  HENT:  Mouth/Throat: Oropharynx is clear and moist. No oropharyngeal exudate.  Cardiovascular: Normal rate, regular rhythm, normal heart sounds and intact distal pulses.   No murmur heard. Pulmonary/Chest: Effort normal and breath sounds normal. No respiratory distress. He has no wheezes. He has no rales.  Musculoskeletal: He exhibits no edema.  Skin: Skin is dry. No rash noted.  Psychiatric: He has a normal mood and affect.  Nursing note and vitals reviewed.  Results for orders placed or performed in visit on 12/12/16  Occult blood card to lab, stool  Result Value Ref Range  Fecal Occult Bld NEGATIVE NEGATIVE  Occult blood card to lab, stool  Result Value Ref Range   Fecal Occult Bld NEGATIVE NEGATIVE   Lab Results  Component Value Date   CREATININE 0.95 11/28/2016       Assessment & Plan:   Problem List Items Addressed This Visit    Essential hypertension - Primary    Chronic, stable. Continue current regimen.       Mixed hyperlipidemia    Chronic, tolerating statin well. Continue The ASCVD Risk score Mikey Bussing DC Jr., et al., 2013) failed to calculate for the following reasons:   The 2013 ASCVD risk score is only valid for ages 17 to 43       Primary adenocarcinoma of prostate ([place])   Primary  open angle glaucoma    S/p eye surgery. Appreciate ophtho care.          Follow up plan: Return in about 6 months (around 08/17/2017) for annual exam, prior fasting for blood work, medicare wellness visit.  Thomas Bush, MD

## 2017-02-17 NOTE — Assessment & Plan Note (Signed)
Chronic, stable. Continue current regimen. 

## 2017-02-17 NOTE — Addendum Note (Signed)
Addended by: Brenton Grills on: 11/11/2977 89:21 AM   Modules accepted: Orders

## 2017-02-17 NOTE — Assessment & Plan Note (Signed)
S/p eye surgery. Appreciate ophtho care.

## 2017-02-27 ENCOUNTER — Inpatient Hospital Stay: Payer: Medicare PPO

## 2017-02-27 ENCOUNTER — Other Ambulatory Visit: Payer: Self-pay | Admitting: Internal Medicine

## 2017-02-27 DIAGNOSIS — E785 Hyperlipidemia, unspecified: Secondary | ICD-10-CM | POA: Diagnosis not present

## 2017-02-27 DIAGNOSIS — K589 Irritable bowel syndrome without diarrhea: Secondary | ICD-10-CM | POA: Insufficient documentation

## 2017-02-27 DIAGNOSIS — Z800 Family history of malignant neoplasm of digestive organs: Secondary | ICD-10-CM | POA: Insufficient documentation

## 2017-02-27 DIAGNOSIS — C61 Malignant neoplasm of prostate: Secondary | ICD-10-CM

## 2017-02-27 DIAGNOSIS — Z86010 Personal history of colonic polyps: Secondary | ICD-10-CM | POA: Diagnosis not present

## 2017-02-27 DIAGNOSIS — R0609 Other forms of dyspnea: Secondary | ICD-10-CM | POA: Diagnosis not present

## 2017-02-27 DIAGNOSIS — Z923 Personal history of irradiation: Secondary | ICD-10-CM | POA: Insufficient documentation

## 2017-02-27 DIAGNOSIS — K219 Gastro-esophageal reflux disease without esophagitis: Secondary | ICD-10-CM | POA: Insufficient documentation

## 2017-02-27 DIAGNOSIS — R5383 Other fatigue: Secondary | ICD-10-CM | POA: Insufficient documentation

## 2017-02-27 DIAGNOSIS — Z87891 Personal history of nicotine dependence: Secondary | ICD-10-CM | POA: Insufficient documentation

## 2017-02-27 DIAGNOSIS — I10 Essential (primary) hypertension: Secondary | ICD-10-CM | POA: Diagnosis not present

## 2017-02-27 DIAGNOSIS — Z79899 Other long term (current) drug therapy: Secondary | ICD-10-CM | POA: Diagnosis not present

## 2017-02-27 DIAGNOSIS — H409 Unspecified glaucoma: Secondary | ICD-10-CM | POA: Insufficient documentation

## 2017-02-27 DIAGNOSIS — Z79818 Long term (current) use of other agents affecting estrogen receptors and estrogen levels: Secondary | ICD-10-CM | POA: Insufficient documentation

## 2017-02-27 DIAGNOSIS — R232 Flushing: Secondary | ICD-10-CM | POA: Insufficient documentation

## 2017-02-27 DIAGNOSIS — Z7982 Long term (current) use of aspirin: Secondary | ICD-10-CM | POA: Insufficient documentation

## 2017-02-27 DIAGNOSIS — D649 Anemia, unspecified: Secondary | ICD-10-CM | POA: Diagnosis not present

## 2017-02-27 LAB — COMPREHENSIVE METABOLIC PANEL
ALK PHOS: 62 U/L (ref 38–126)
ALT: 13 U/L — AB (ref 17–63)
AST: 16 U/L (ref 15–41)
Albumin: 3.2 g/dL — ABNORMAL LOW (ref 3.5–5.0)
Anion gap: 9 (ref 5–15)
BILIRUBIN TOTAL: 0.3 mg/dL (ref 0.3–1.2)
BUN: 29 mg/dL — ABNORMAL HIGH (ref 6–20)
CALCIUM: 9.4 mg/dL (ref 8.9–10.3)
CO2: 27 mmol/L (ref 22–32)
CREATININE: 1.07 mg/dL (ref 0.61–1.24)
Chloride: 102 mmol/L (ref 101–111)
Glucose, Bld: 164 mg/dL — ABNORMAL HIGH (ref 65–99)
Potassium: 3.7 mmol/L (ref 3.5–5.1)
Sodium: 138 mmol/L (ref 135–145)
Total Protein: 8.2 g/dL — ABNORMAL HIGH (ref 6.5–8.1)

## 2017-02-27 LAB — CBC WITH DIFFERENTIAL/PLATELET
Basophils Absolute: 0 10*3/uL (ref 0–0.1)
Basophils Relative: 0 %
EOS PCT: 1 %
Eosinophils Absolute: 0 10*3/uL (ref 0–0.7)
HCT: 25.8 % — ABNORMAL LOW (ref 40.0–52.0)
HEMOGLOBIN: 8.1 g/dL — AB (ref 13.0–18.0)
LYMPHS ABS: 0.8 10*3/uL — AB (ref 1.0–3.6)
LYMPHS PCT: 11 %
MCH: 25.6 pg — AB (ref 26.0–34.0)
MCHC: 31.4 g/dL — ABNORMAL LOW (ref 32.0–36.0)
MCV: 81.4 fL (ref 80.0–100.0)
Monocytes Absolute: 0.4 10*3/uL (ref 0.2–1.0)
Monocytes Relative: 6 %
NEUTROS PCT: 82 %
Neutro Abs: 5.8 10*3/uL (ref 1.4–6.5)
Platelets: 439 10*3/uL (ref 150–440)
RBC: 3.17 MIL/uL — AB (ref 4.40–5.90)
RDW: 17.4 % — ABNORMAL HIGH (ref 11.5–14.5)
WBC: 7 10*3/uL (ref 3.8–10.6)

## 2017-02-27 LAB — IRON AND TIBC
Iron: 24 ug/dL — ABNORMAL LOW (ref 45–182)
Saturation Ratios: 9 % — ABNORMAL LOW (ref 17.9–39.5)
TIBC: 258 ug/dL (ref 250–450)
UIBC: 234 ug/dL

## 2017-02-27 LAB — FERRITIN: Ferritin: 227 ng/mL (ref 24–336)

## 2017-02-27 LAB — LACTATE DEHYDROGENASE: LDH: 110 U/L (ref 98–192)

## 2017-02-27 LAB — PSA: PROSTATIC SPECIFIC ANTIGEN: 0.97 ng/mL (ref 0.00–4.00)

## 2017-03-01 ENCOUNTER — Ambulatory Visit
Admission: RE | Admit: 2017-03-01 | Discharge: 2017-03-01 | Disposition: A | Discharge status: Home / Self Care | Payer: Medicare PPO | Source: Ambulatory Visit | Attending: Internal Medicine | Admitting: Internal Medicine

## 2017-03-01 ENCOUNTER — Inpatient Hospital Stay: Payer: Medicare PPO

## 2017-03-01 ENCOUNTER — Inpatient Hospital Stay: Payer: Medicare PPO | Attending: Internal Medicine | Admitting: Internal Medicine

## 2017-03-01 VITALS — BP 109/61 | HR 74 | Temp 97.6°F | Resp 18 | Ht 67.75 in | Wt 150.0 lb

## 2017-03-01 DIAGNOSIS — R232 Flushing: Secondary | ICD-10-CM | POA: Diagnosis not present

## 2017-03-01 DIAGNOSIS — H409 Unspecified glaucoma: Secondary | ICD-10-CM

## 2017-03-01 DIAGNOSIS — C61 Malignant neoplasm of prostate: Secondary | ICD-10-CM | POA: Insufficient documentation

## 2017-03-01 DIAGNOSIS — R0609 Other forms of dyspnea: Secondary | ICD-10-CM

## 2017-03-01 DIAGNOSIS — Z79818 Long term (current) use of other agents affecting estrogen receptors and estrogen levels: Secondary | ICD-10-CM | POA: Diagnosis not present

## 2017-03-01 DIAGNOSIS — I700 Atherosclerosis of aorta: Secondary | ICD-10-CM | POA: Insufficient documentation

## 2017-03-01 DIAGNOSIS — Z7982 Long term (current) use of aspirin: Secondary | ICD-10-CM

## 2017-03-01 DIAGNOSIS — D649 Anemia, unspecified: Secondary | ICD-10-CM

## 2017-03-01 DIAGNOSIS — Z86010 Personal history of colonic polyps: Secondary | ICD-10-CM

## 2017-03-01 DIAGNOSIS — Z923 Personal history of irradiation: Secondary | ICD-10-CM

## 2017-03-01 DIAGNOSIS — K589 Irritable bowel syndrome without diarrhea: Secondary | ICD-10-CM

## 2017-03-01 DIAGNOSIS — K219 Gastro-esophageal reflux disease without esophagitis: Secondary | ICD-10-CM

## 2017-03-01 DIAGNOSIS — E785 Hyperlipidemia, unspecified: Secondary | ICD-10-CM

## 2017-03-01 DIAGNOSIS — Z79899 Other long term (current) drug therapy: Secondary | ICD-10-CM

## 2017-03-01 DIAGNOSIS — Z87891 Personal history of nicotine dependence: Secondary | ICD-10-CM

## 2017-03-01 DIAGNOSIS — R5383 Other fatigue: Secondary | ICD-10-CM

## 2017-03-01 DIAGNOSIS — I10 Essential (primary) hypertension: Secondary | ICD-10-CM

## 2017-03-01 MED ORDER — LEUPROLIDE ACETATE (3 MONTH) 22.5 MG IM KIT
22.5000 mg | PACK | Freq: Once | INTRAMUSCULAR | Status: AC
  Administered 2017-03-01: 22.5 mg via INTRAMUSCULAR
  Filled 2017-03-01: qty 22.5

## 2017-03-01 NOTE — Assessment & Plan Note (Addendum)
Metastatic prostate cancer Hormone sensitive- biochemical recurrence [M0]- given the rapid doubling time of PSA patient on- ADT every 3 months. PSA  October 2018- 0.9 Tolerating ADT well.  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 Currently no indications.    # Anemia- Hb 8.3 worsening; on PO iron. monitor for now [previous colonoscopy-2016- unsuccessful sec to tortuosity; Dr.jacobs ]; stool test-NEG [july 2018]; on study is not suggestive of iron deficiency ; LDH normal; did not suspect hemolysis.Recommend BMBx-reviewed the procedure potential complications with the patient is agreeable.  # Fatigue/shortness of breath on exertion- recommend a chest x-ray.   # DEXA scan jan 2018- NO osteoporosis; recommend Ca+ vit D  #Follow with me labs/cbc/b12-folate.

## 2017-03-01 NOTE — Progress Notes (Signed)
[place] NOTE  Patient Care Team: [name], MD as PCP - General (Family Medicine) Bond, Tracie Harrier, MD as Consulting Physician (Ophthalmology)  CHIEF COMPLAINTS/PURPOSE OF CONSULTATION:  Oncology History   # external beam radiation therapy and radiation seed implant in 2005 with Dr. Joelyn Oms and Dr. Danny Lawless for T1c Gleason 3+4 = 7 prostate cancer with a pretreatment PSA of 6.8. In November 2015, he was found to have biochemical recurrence.  Dec 2016- CT a/p & Bone scan- NEG; PSA- 11; June 2017- 19; STARTED on Firmagon [Dr.Budzyn]; on Lupron q 70M  # JAN 2018- Dexa scan- N     Primary adenocarcinoma of prostate ([place])    Initial Diagnosis    Primary adenocarcinoma of prostate ([place])        HISTORY OF PRESENTING ILLNESS:  Thomas Lopez July 83 y.o.  male very pleasant patient with above history of prostate cancer - Currently with recurrent prostate cancer/biochemical recurrence- hormone sensitive on ADT is here for follow-up.   Patient complains of "giving out" with exertion. Denies any chest pain. He does admit to fatigue. Denies any blood in stools or black stools. Denies any headaches. Denies any bone pain. Has hot flashes which are stable. No significant weight loss no new constipation.   ROS: A complete 10 point review of system is done which is negative except mentioned above in history of present illness  MEDICAL HISTORY:  Past Medical History:  Diagnosis Date  . Aorto-iliac atherosclerosis ([place]) 04/2015   by CT scan  . Carotid stenosis 11/2014   mild bilateral 1-39%, rpt 2 yrs  . Colon polyps    rpt colonoscopy due 2014  . ED (erectile dysfunction)    Trimix and VED  . Esophagitis   . Essential hypertension 06/29/2009  . GERD (gastroesophageal reflux disease)   . Glaucoma    severe ([place])  . HLD (hyperlipidemia)   . HTN (hypertension)   . Hyperglycemia   . IBS (irritable bowel syndrome)   . Prostate cancer [facility]) 2005   s/p seed  implant, EBRT ([facility] Alinda [facility] at [facility]) recurrent treating with androgen deprivation referred to cancer center  . Smoking history     SURGICAL HISTORY: Past Surgical History:  Procedure Laterality Date  . CATARACT EXTRACTION, BILATERAL  December 2016   [place], Dr. Raynelle Fanning  . COLONOSCOPY  03/01/01   Multiple polyps, divertic//adenomatous polyps  . COLONOSCOPY  04/24/01   multiple polyps  . COLONOSCOPY  10/23/01   Polyps benign-repeat every 2 years  . COLONOSCOPY  06/23/04   Adenom/hyperplastic polyps; multiple external hemorrhoids  . COLONOSCOPY  06/08/07   single small polyp-repeat 5 years  . ESOPHAGOGASTRODUODENOSCOPY  03/01/01   H.H.; gastritis esoph. dudodenitis  . NCS/Wrists Left Carpal  2/02   Min./right improved  . PFTs  10/2012   FVC 64%, FEV1 41%, ratio 0.62  . PROSTATE BIOPSY  12/04   positive; radioactive seed implant (Dr. Joelyn Oms)  . Prostate Ext Beam Radiation  1/27-07/23/03   Dr. Danny Lawless  . SPIROMETRY  09/2011   WNL  . Wrist Release  6/98; 9/99   Right    SOCIAL HISTORY: Social History   Social History  . Marital status: Legally Separated    Spouse name: N/A  . Number of children: 5  . Years of education: N/A   Occupational History  . Retired    Social History Main Topics  . Smoking status: Former Smoker    Quit date: 05/23/1993  .  Smokeless tobacco: Never Used  . Alcohol use Yes     Comment: Rare  . Drug use: No  . Sexual activity: Yes   Other Topics Concern  . 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 Cancer Mother        colon  . Hyperlipidemia Mother   . Other Mother        Carotid disease  . Coronary artery disease Neg Hx   . Stroke Neg Hx     ALLERGIES:  has No Known  Allergies.  MEDICATIONS:  Current Outpatient Prescriptions  Medication Sig Dispense Refill  . aspirin EC 81 MG tablet Take 81 mg by mouth daily.    Marland Kitchen leuprolide (LUPRON) 22.5 MG injection Inject 22.5 mg into the muscle every 3 (three) months.    Marland Kitchen lisinopril (PRINIVIL,ZESTRIL) 10 MG tablet Take 1 tablet (10 mg total) by mouth daily. 90 tablet 3  . methylcellulose packet Take 1 each by mouth as needed for constipation. Reported on 11/05/2015    . omeprazole (PRILOSEC) 40 MG capsule Take 1 capsule (40 mg total) by mouth daily. 90 capsule 3  . rosuvastatin (CRESTOR) 10 MG tablet Take 1 tablet (10 mg total) by mouth daily. 90 tablet 3  . venlafaxine XR (EFFEXOR-XR) 37.5 MG 24 hr capsule Take 1 capsule (37.5 mg total) by mouth daily with breakfast. 90 capsule 3  . brimonidine (ALPHAGAN P) 0.1 % SOLN Apply 1 drop to eye at bedtime.    . dorzolamide-timolol (COSOPT) 22.3-6.8 MG/ML ophthalmic solution As directed    . latanoprost (XALATAN) 0.005 % ophthalmic solution Place 1 drop into both eyes at bedtime.     No current facility-administered medications for this visit.       Marland Kitchen  PHYSICAL EXAMINATION: ECOG PERFORMANCE STATUS: 0 - Asymptomatic  Vitals:   03/01/17 1442  BP: 109/61  Pulse: 74  Resp: 18  Temp: 97.6 F (36.4 C)   Filed Weights   03/01/17 1442  Weight: 150 lb (68 kg)    GENERAL: Well-nourished well-developed; Alert, no distress and comfortable.  Accompanied by his wife.  EYES: no pallor or icterus OROPHARYNX: no thrush or ulceration; good dentition  NECK: supple, no masses felt LYMPH:  no palpable lymphadenopathy in the cervical, axillary or inguinal regions LUNGS: clear to auscultation and  No wheeze or crackles HEART/CVS: regular rate & rhythm and no murmurs; No lower extremity edema ABDOMEN: abdomen soft, non-tender and normal bowel sounds Musculoskeletal:no cyanosis of digits and no clubbing  PSYCH: alert & oriented x 3 with fluent speech NEURO: no focal  motor/sensory deficits SKIN:  no rashes or significant lesions  Results for LEMUEL, BOODRAM (MRN [id number]) as of 03/01/2017 15:03  Ref. Range 12/03/2015 09:45 03/03/2016 09:35 06/02/2016 09:26 08/29/2016 09:45 02/27/2017 14:21  PSA Latest Ref Range: 0.00 - 4.00 ng/mL 2.40 0.77 0.60 0.58   Prostatic Specific Antigen Latest Ref Range: 0.00 - 4.00 ng/mL     0.97    LABORATORY DATA:  I have reviewed the data as listed Lab Results  Component Value Date   WBC 7.0 02/27/2017   HGB 8.1 (L) 02/27/2017   HCT 25.8 (L) 02/27/2017   MCV 81.4 02/27/2017   PLT 439 02/27/2017  Recent Labs  06/02/16 0926 08/29/16 0945 11/28/16 1335 02/27/17 1427  NA 133* 135 137 138  K 4.4 3.7 4.3 3.7  CL 103 101 103 102  CO2 26 25 26 27   GLUCOSE 158* 149* 97 164*  BUN 42* 34* 25* 29*  CREATININE 1.04 1.06 0.95 1.07  CALCIUM 9.4 9.4 9.6 9.4  GFRNONAA >60 >60 >60 >60  GFRAA >60 >60 >60 >60  PROT 7.5 7.6  --  8.2*  ALBUMIN 4.2 3.7  --  3.2*  AST 30 20  --  16  ALT 23 14*  --  13*  ALKPHOS 55 61  --  62  BILITOT 0.6 0.6  --  0.3    RADIOGRAPHIC STUDIES: I have personally reviewed the radiological images as listed and agreed with the findings in the report. No results found.  ASSESSMENT & PLAN:   Primary adenocarcinoma of prostate ([place]) Metastatic prostate cancer Hormone sensitive- biochemical recurrence [M0]- given the rapid doubling time of PSA patient on- ADT every 3 months. PSA  October 2018- 0.9 Tolerating ADT well.  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 Currently no indications.    # Anemia- Hb 8.3 worsening; on PO iron. monitor for now [previous colonoscopy-2016- unsuccessful sec to tortuosity; Dr.jacobs ]; stool test-NEG [july 2018]; on study is not suggestive of iron deficiency ; LDH normal; did not suspect hemolysis.Recommend BMBx-reviewed the procedure potential complications with the patient is  agreeable.  # Fatigue/shortness of breath on exertion- recommend a chest x-ray.   # DEXA scan jan 2018- NO osteoporosis; recommend Ca+ vit D  #Follow with me labs/cbc/b12-folate.      Cammie Sickle, MD 03/01/2017 4:23 PM

## 2017-03-07 ENCOUNTER — Other Ambulatory Visit: Payer: Self-pay | Admitting: Radiology

## 2017-03-08 ENCOUNTER — Other Ambulatory Visit (HOSPITAL_COMMUNITY)
Admission: RE | Admit: 2017-03-08 | Discharge: 2017-03-08 | Disposition: A | Discharge status: Home / Self Care | Payer: Medicare PPO | Source: Ambulatory Visit | Attending: Internal Medicine | Admitting: Internal Medicine

## 2017-03-08 ENCOUNTER — Ambulatory Visit
Admission: RE | Admit: 2017-03-08 | Discharge: 2017-03-08 | Disposition: A | Discharge status: Home / Self Care | Payer: Medicare PPO | Source: Ambulatory Visit | Attending: Internal Medicine | Admitting: Internal Medicine

## 2017-03-08 DIAGNOSIS — C61 Malignant neoplasm of prostate: Secondary | ICD-10-CM | POA: Diagnosis not present

## 2017-03-08 DIAGNOSIS — D649 Anemia, unspecified: Secondary | ICD-10-CM | POA: Insufficient documentation

## 2017-03-08 DIAGNOSIS — D7589 Other specified diseases of blood and blood-forming organs: Secondary | ICD-10-CM | POA: Insufficient documentation

## 2017-03-08 DIAGNOSIS — R0609 Other forms of dyspnea: Secondary | ICD-10-CM | POA: Diagnosis not present

## 2017-03-08 DIAGNOSIS — Z8546 Personal history of malignant neoplasm of prostate: Secondary | ICD-10-CM | POA: Diagnosis not present

## 2017-03-08 LAB — CBC WITH DIFFERENTIAL/PLATELET
BASOS ABS: 0 10*3/uL (ref 0–0.1)
BASOS PCT: 0 %
EOS ABS: 0.1 10*3/uL (ref 0–0.7)
Eosinophils Relative: 1 %
HCT: 27.1 % — ABNORMAL LOW (ref 40.0–52.0)
HEMOGLOBIN: 8.5 g/dL — AB (ref 13.0–18.0)
Lymphocytes Relative: 10 %
Lymphs Abs: 0.8 10*3/uL — ABNORMAL LOW (ref 1.0–3.6)
MCH: 25 pg — ABNORMAL LOW (ref 26.0–34.0)
MCHC: 31.3 g/dL — ABNORMAL LOW (ref 32.0–36.0)
MCV: 80 fL (ref 80.0–100.0)
MONOS PCT: 7 %
Monocytes Absolute: 0.5 10*3/uL (ref 0.2–1.0)
NEUTROS PCT: 82 %
Neutro Abs: 6.2 10*3/uL (ref 1.4–6.5)
PLATELETS: 373 10*3/uL (ref 150–440)
RBC: 3.38 MIL/uL — AB (ref 4.40–5.90)
RDW: 18.1 % — ABNORMAL HIGH (ref 11.5–14.5)
WBC: 7.6 10*3/uL (ref 3.8–10.6)

## 2017-03-08 LAB — PROTIME-INR
INR: 1.19
PROTHROMBIN TIME: 15 s (ref 11.4–15.2)

## 2017-03-08 MED ORDER — FENTANYL CITRATE (PF) 100 MCG/2ML IJ SOLN
INTRAMUSCULAR | Status: AC
  Filled 2017-03-08: qty 4

## 2017-03-08 MED ORDER — SODIUM CHLORIDE 0.9 % IV SOLN
INTRAVENOUS | Status: DC
  Administered 2017-03-08: 09:00:00 via INTRAVENOUS

## 2017-03-08 MED ORDER — HEPARIN SOD (PORK) LOCK FLUSH 100 UNIT/ML IV SOLN
INTRAVENOUS | Status: AC
  Filled 2017-03-08: qty 5

## 2017-03-08 MED ORDER — MIDAZOLAM HCL 5 MG/5ML IJ SOLN
INTRAMUSCULAR | Status: AC | PRN
  Administered 2017-03-08: 1 mg via INTRAVENOUS
  Administered 2017-03-08: 0.5 mg via INTRAVENOUS

## 2017-03-08 MED ORDER — MIDAZOLAM HCL 5 MG/5ML IJ SOLN
INTRAMUSCULAR | Status: AC
  Filled 2017-03-08: qty 5

## 2017-03-08 MED ORDER — FENTANYL CITRATE (PF) 100 MCG/2ML IJ SOLN
INTRAMUSCULAR | Status: AC | PRN
  Administered 2017-03-08: 50 ug via INTRAVENOUS

## 2017-03-08 NOTE — Procedures (Signed)
Interventional Radiology Procedure Note  Procedure: CT guided aspirate and core biopsy of right iliac bone Complications: None Recommendations: - Bedrest supine x 1 hrs - Hydrocodone PRN  Pain - Follow biopsy results  Signed,  Detrell Umscheid K. Yue Glasheen, MD   

## 2017-03-16 ENCOUNTER — Telehealth: Payer: Self-pay | Admitting: Internal Medicine

## 2017-03-16 NOTE — Telephone Encounter (Signed)
[facility]- Call [facility]- pathologist Dr.Butler-]- and have them ADD FISH for MDS. If any questions pathologist can reach me at [phone].

## 2017-03-16 NOTE — Telephone Encounter (Signed)
I spoke with Shirlean Mylar at [facility].  She states to fax over a sheet of paper stating: Add FISH for MDS for case # [id number], per Dr. Rogue Bussing. Paper with that information has been faxed into the fax # provided. Thanks!

## 2017-03-17 ENCOUNTER — Inpatient Hospital Stay (HOSPITAL_BASED_OUTPATIENT_CLINIC_OR_DEPARTMENT_OTHER): Payer: Medicare PPO | Admitting: Internal Medicine

## 2017-03-17 ENCOUNTER — Inpatient Hospital Stay: Payer: Medicare PPO

## 2017-03-17 ENCOUNTER — Encounter: Payer: Self-pay | Admitting: Internal Medicine

## 2017-03-17 ENCOUNTER — Other Ambulatory Visit: Payer: Self-pay

## 2017-03-17 VITALS — BP 131/68 | HR 66 | Temp 97.8°F | Resp 12 | Ht 68.0 in | Wt 153.4 lb

## 2017-03-17 DIAGNOSIS — Z86010 Personal history of colonic polyps: Secondary | ICD-10-CM

## 2017-03-17 DIAGNOSIS — R232 Flushing: Secondary | ICD-10-CM

## 2017-03-17 DIAGNOSIS — Z923 Personal history of irradiation: Secondary | ICD-10-CM | POA: Diagnosis not present

## 2017-03-17 DIAGNOSIS — C61 Malignant neoplasm of prostate: Secondary | ICD-10-CM

## 2017-03-17 DIAGNOSIS — Z79899 Other long term (current) drug therapy: Secondary | ICD-10-CM

## 2017-03-17 DIAGNOSIS — R5383 Other fatigue: Secondary | ICD-10-CM | POA: Diagnosis not present

## 2017-03-17 DIAGNOSIS — K589 Irritable bowel syndrome without diarrhea: Secondary | ICD-10-CM

## 2017-03-17 DIAGNOSIS — D649 Anemia, unspecified: Secondary | ICD-10-CM

## 2017-03-17 DIAGNOSIS — H409 Unspecified glaucoma: Secondary | ICD-10-CM

## 2017-03-17 DIAGNOSIS — Z7982 Long term (current) use of aspirin: Secondary | ICD-10-CM

## 2017-03-17 DIAGNOSIS — I10 Essential (primary) hypertension: Secondary | ICD-10-CM

## 2017-03-17 DIAGNOSIS — Z87891 Personal history of nicotine dependence: Secondary | ICD-10-CM

## 2017-03-17 DIAGNOSIS — R0609 Other forms of dyspnea: Secondary | ICD-10-CM | POA: Diagnosis not present

## 2017-03-17 DIAGNOSIS — Z800 Family history of malignant neoplasm of digestive organs: Secondary | ICD-10-CM

## 2017-03-17 DIAGNOSIS — K219 Gastro-esophageal reflux disease without esophagitis: Secondary | ICD-10-CM | POA: Diagnosis not present

## 2017-03-17 DIAGNOSIS — Z79818 Long term (current) use of other agents affecting estrogen receptors and estrogen levels: Secondary | ICD-10-CM

## 2017-03-17 DIAGNOSIS — E785 Hyperlipidemia, unspecified: Secondary | ICD-10-CM | POA: Diagnosis not present

## 2017-03-17 LAB — CBC WITH DIFFERENTIAL/PLATELET
BASOS ABS: 0 10*3/uL (ref 0–0.1)
Basophils Relative: 0 %
EOS ABS: 0.1 10*3/uL (ref 0–0.7)
EOS PCT: 1 %
HCT: 24.8 % — ABNORMAL LOW (ref 40.0–52.0)
Hemoglobin: 7.8 g/dL — ABNORMAL LOW (ref 13.0–18.0)
LYMPHS ABS: 1.2 10*3/uL (ref 1.0–3.6)
Lymphocytes Relative: 14 %
MCH: 25.3 pg — AB (ref 26.0–34.0)
MCHC: 31.4 g/dL — ABNORMAL LOW (ref 32.0–36.0)
MCV: 80.7 fL (ref 80.0–100.0)
MONO ABS: 0.6 10*3/uL (ref 0.2–1.0)
Monocytes Relative: 8 %
Neutro Abs: 6.4 10*3/uL (ref 1.4–6.5)
Neutrophils Relative %: 77 %
Platelets: 364 10*3/uL (ref 150–440)
RBC: 3.08 MIL/uL — ABNORMAL LOW (ref 4.40–5.90)
RDW: 18.3 % — AB (ref 11.5–14.5)
WBC: 8.4 10*3/uL (ref 3.8–10.6)

## 2017-03-17 LAB — ABO/RH: ABO/RH(D): A POS

## 2017-03-17 LAB — SAMPLE TO BLOOD BANK

## 2017-03-17 LAB — FOLATE: Folate: 10.1 ng/mL (ref >5.9)

## 2017-03-17 LAB — PREPARE RBC (CROSSMATCH)

## 2017-03-17 NOTE — Progress Notes (Signed)
Thomas Lopez NOTE  Patient Care Team: [name], MD as PCP - General (Family Medicine) Bond, Tracie Harrier, MD as Consulting Physician (Ophthalmology)  CHIEF COMPLAINTS/PURPOSE OF CONSULTATION:  Oncology History   # external beam radiation therapy and radiation seed implant in 2005 with Dr. Joelyn Oms and Dr. Danny Lawless for T1c Gleason 3+4 = 7 prostate cancer with a pretreatment PSA of 6.8. In November 2015, he was found to have biochemical recurrence.  Dec 2016- CT a/p & Bone scan- NEG; PSA- 11; June 2017- 19; STARTED on Firmagon [Dr.Budzyn]; on Lupron q 31M  # OCT 2018- BMBx- hypercellular 30%; relative myeloid hyperplasia/erythroid hypoplasia; FISH pending  # JAN 2018- Dexa scan- N     Primary adenocarcinoma of prostate ([place])     HISTORY OF PRESENTING ILLNESS:  Thomas Lopez 83 y.o.  male very pleasant patient with above history of prostate cancer - Currently with recurrent prostate cancer/biochemical recurrence- hormone sensitive on ADT; and also History of worsening anemia is here to review the results of his bone marrow biopsy.  Patient's bone marrow biopsy procedure was uneventful. He complains of significant fatigue. Denies any blood in stools or black stools. Denies any presyncopal episodes of syncope.   Denies any bone pain. Has hot flashes which are stable. No significant weight loss no new constipation.   ROS: A complete 10 point review of system is done which is negative except mentioned above in history of present illness  MEDICAL HISTORY:  Past Medical History:  Diagnosis Date  . Aorto-iliac atherosclerosis ([place]) 04/2015   by CT scan  . Carotid stenosis 11/2014   mild bilateral 1-39%, rpt 2 yrs  . Colon polyps    rpt colonoscopy due 2014  . ED (erectile dysfunction)    Trimix and VED  . Esophagitis   . Essential hypertension 06/29/2009  . GERD (gastroesophageal reflux disease)   . Glaucoma    severe ([place])  . HLD (hyperlipidemia)   .  HTN (hypertension)   . Hyperglycemia   . IBS (irritable bowel syndrome)   . Prostate cancer [facility]) 2005   s/p seed implant, EBRT (Dr. Alinda Money at [facility]) recurrent treating with androgen deprivation referred to cancer center  . Smoking history     SURGICAL HISTORY: Past Surgical History:  Procedure Laterality Date  . CATARACT EXTRACTION, BILATERAL  December 2016   [place], Dr. Raynelle Fanning  . COLONOSCOPY  03/01/01   Multiple polyps, divertic//adenomatous polyps  . COLONOSCOPY  04/24/01   multiple polyps  . COLONOSCOPY  10/23/01   Polyps benign-repeat every 2 years  . COLONOSCOPY  06/23/04   Adenom/hyperplastic polyps; multiple external hemorrhoids  . COLONOSCOPY  06/08/07   single small polyp-repeat 5 years  . ESOPHAGOGASTRODUODENOSCOPY  03/01/01   H.H.; gastritis esoph. dudodenitis  . NCS/Wrists Left Carpal  2/02   Min./right improved  . PFTs  10/2012   FVC 64%, FEV1 41%, ratio 0.62  . PROSTATE BIOPSY  12/04   positive; radioactive seed implant (Dr. Joelyn Oms)  . Prostate Ext Beam Radiation  1/27-07/23/03   Dr. Danny Lawless  . SPIROMETRY  09/2011   WNL  . Wrist Release  6/98; 9/99   Right    SOCIAL HISTORY: Social History   Social History  . Marital status: Legally Separated    Spouse name: N/A  . Number of children: 5  . Years of education: N/A   Occupational History  . Retired    Social History Main Topics  . Smoking status:  Former Smoker    Quit date: 05/23/1993  . Smokeless tobacco: Never Used  . Alcohol use Yes     Comment: Rare  . Drug use: No  . Sexual activity: Yes   Other Topics Concern  . 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 Cancer Mother        colon  . Hyperlipidemia Mother   . Other  Mother        Carotid disease  . Coronary artery disease Neg Hx   . Stroke Neg Hx     ALLERGIES:  has No Known Allergies.  MEDICATIONS:  Current Outpatient Prescriptions  Medication Sig Dispense Refill  . aspirin EC 81 MG tablet Take 81 mg by mouth daily.    . dorzolamide-timolol (COSOPT) 22.3-6.8 MG/ML ophthalmic solution As directed    . latanoprost (XALATAN) 0.005 % ophthalmic solution Place 1 drop into both eyes at bedtime.    Marland Kitchen leuprolide (LUPRON) 22.5 MG injection Inject 22.5 mg into the muscle every 3 (three) months.    Marland Kitchen lisinopril (PRINIVIL,ZESTRIL) 10 MG tablet Take 1 tablet (10 mg total) by mouth daily. 90 tablet 3  . methylcellulose packet Take 1 each by mouth as needed for constipation. Reported on 11/05/2015    . omeprazole (PRILOSEC) 40 MG capsule Take 1 capsule (40 mg total) by mouth daily. 90 capsule 3  . rosuvastatin (CRESTOR) 10 MG tablet Take 1 tablet (10 mg total) by mouth daily. 90 tablet 3  . venlafaxine XR (EFFEXOR-XR) 37.5 MG 24 hr capsule Take 1 capsule (37.5 mg total) by mouth daily with breakfast. 90 capsule 3   No current facility-administered medications for this visit.       Marland Kitchen  PHYSICAL EXAMINATION: ECOG PERFORMANCE STATUS: 0 - Asymptomatic  Vitals:   03/17/17 1357  BP: 131/68  Pulse: 66  Resp: 12  Temp: 97.8 F (36.6 C)   Filed Weights   03/17/17 1350  Weight: 153 lb 6.4 oz (69.6 kg)    GENERAL: Well-nourished well-developed; Alert, no distress and comfortable.  Accompanied by his wife.  EYES: no pallor or icterus OROPHARYNX: no thrush or ulceration; good dentition  NECK: supple, no masses felt LYMPH:  no palpable lymphadenopathy in the cervical, axillary or inguinal regions LUNGS: clear to auscultation and  No wheeze or crackles HEART/CVS: regular rate & rhythm and no murmurs; No lower extremity edema ABDOMEN: abdomen soft, non-tender and normal bowel sounds Musculoskeletal:no cyanosis of digits and no clubbing  PSYCH: alert &  oriented x 3 with fluent speech NEURO: no focal motor/sensory deficits SKIN:  no rashes or significant lesions  Results for Thomas Lopez, Thomas Lopez (MRN [id number]) as of 03/01/2017 15:03  Ref. Range 12/03/2015 09:45 03/03/2016 09:35 06/02/2016 09:26 08/29/2016 09:45 02/27/2017 14:21  PSA Latest Ref Range: 0.00 - 4.00 ng/mL 2.40 0.77 0.60 0.58   Prostatic Specific Antigen Latest Ref Range: 0.00 - 4.00 ng/mL     0.97    LABORATORY DATA:  I have reviewed the data as listed Lab Results  Component Value Date   WBC 8.4 03/17/2017   HGB 7.8 (L) 03/17/2017   HCT 24.8 (L) 03/17/2017   MCV 80.7 03/17/2017   PLT 364 03/17/2017    Recent Labs  06/02/16  9169 08/29/16 0945 11/28/16 1335 02/27/17 1427  NA 133* 135 137 138  K 4.4 3.7 4.3 3.7  CL 103 101 103 102  CO2 26 25 26 27   GLUCOSE 158* 149* 97 164*  BUN 42* 34* 25* 29*  CREATININE 1.04 1.06 0.95 1.07  CALCIUM 9.4 9.4 9.6 9.4  GFRNONAA >60 >60 >60 >60  GFRAA >60 >60 >60 >60  PROT 7.5 7.6  --  8.2*  ALBUMIN 4.2 3.7  --  3.2*  AST 30 20  --  16  ALT 23 14*  --  13*  ALKPHOS 55 61  --  62  BILITOT 0.6 0.6  --  0.3    RADIOGRAPHIC STUDIES: I have personally reviewed the radiological images as listed and agreed with the findings in the report. Dg Chest 2 View  Result Date: 03/01/2017 CLINICAL DATA:  Dyspnea on exertion. EXAM: CHEST  2 VIEW COMPARISON:  08/08/2012 FINDINGS: The heart size and mediastinal contours are within normal limits. Both lungs are clear. Nipple shadow at the right lung base. The visualized skeletal structures are unremarkable. IMPRESSION: No acute abnormality. Aortic atherosclerosis. Electronically Signed   By: Lorriane Shire M.D.   On: 03/01/2017 16:53   Ct Biopsy  Result Date: 03/08/2017 INDICATION: 83 year old male with a history of prostate cancer and normocytic anemia. EXAM: CT GUIDED BONE MARROW ASPIRATION AND CORE BIOPSY Interventional Radiologist:  Criselda Peaches, MD MEDICATIONS: None. ANESTHESIA/SEDATION:  Moderate (conscious) sedation was employed during this procedure. A total of 1.5 milligrams versed and 50 micrograms fentanyl were administered intravenously. The patient's level of consciousness and vital signs were monitored continuously by radiology nursing throughout the procedure under my direct supervision. Total monitored sedation time: 16 minutes FLUOROSCOPY TIME:  Fluoroscopy Time: 0 minutes 0 seconds (0 mGy). COMPLICATIONS: None immediate. Estimated blood loss: <25 mL PROCEDURE: Informed written consent was obtained from the patient after a thorough discussion of the procedural risks, benefits and alternatives. All questions were addressed. Maximal Sterile Barrier Technique was utilized including caps, mask, sterile gowns, sterile gloves, sterile drape, hand hygiene and skin antiseptic. A timeout was performed prior to the initiation of the procedure. The patient was positioned prone and non-contrast localization CT was performed of the pelvis to demonstrate the iliac marrow spaces. Maximal barrier sterile technique utilized including caps, mask, sterile gowns, sterile gloves, large sterile drape, hand hygiene, and betadine prep. Under sterile conditions and local anesthesia, an 11 gauge coaxial bone biopsy needle was advanced into the right iliac marrow space. Needle position was confirmed with CT imaging. Initially, bone marrow aspiration was performed. Next, the 11 gauge outer cannula was utilized to obtain a right iliac bone marrow core biopsy. Needle was removed. Hemostasis was obtained with compression. The patient tolerated the procedure well. Samples were prepared with the cytotechnologist. IMPRESSION: Technically successful CT-guided core biopsy and aspiration of right iliac bone marrow. Electronically Signed   By: Jacqulynn Cadet M.D.   On: 03/08/2017 11:39    ASSESSMENT & PLAN:   Normocytic anemia # Anemia- Hb 7.8 worsening/symptomatic;oct 2018- BMBx- mildly hypercellular ? Low grade  MDS; awaiting on FISH studies. Also awaiting on b12/folate; will check copper/zinc at next labs.   # Discussed the patient with concerns for underlying low-grade myelodysplastic syndrome causing his anemia/fatigue.  Also discussed the treatment options include Aranesp/IV iron; hypomethylating agents etc. However we'll await on fish testing for further recommendations.  # Metastatic prostate cancer Hormone sensitive- biochemical recurrence [M0]- given the rapid doubling time of PSA patient  on- ADT every 3 months. PSA  October 2018- 0.9 Tolerating ADT well.   #Follow with me on NOV 9th/ labs-cbc/hold tube/copper-zinc.  The above plan of care was discussed with the patient/wife in detail. They agree.  Cc; Dr.Guitrrez.      Cammie Sickle, MD 03/17/2017 2:36 PM

## 2017-03-17 NOTE — Assessment & Plan Note (Addendum)
#   Anemia- Hb 7.8 worsening/symptomatic;oct 2018- BMBx- mildly hypercellular ? Low grade MDS; awaiting on FISH studies. Also awaiting on b12/folate; will check copper/zinc at next labs.   # Discussed the patient with concerns for underlying low-grade myelodysplastic syndrome causing his anemia/fatigue.  Also discussed the treatment options include Aranesp/IV iron; hypomethylating agents etc. However we'll await on fish testing for further recommendations.  # Metastatic prostate cancer Hormone sensitive- biochemical recurrence [M0]- given the rapid doubling time of PSA patient on- ADT every 3 months. PSA  October 2018- 0.9 Tolerating ADT well.   #Follow with me on NOV 9th/ labs-cbc/hold tube/copper-zinc.  The above plan of care was discussed with the patient/wife in detail. They agree.  Cc; Dr.Guitrrez.

## 2017-03-18 LAB — VITAMIN B12: Vitamin B-12: 416 pg/mL (ref 180–914)

## 2017-03-20 ENCOUNTER — Inpatient Hospital Stay: Payer: Medicare PPO

## 2017-03-20 DIAGNOSIS — D649 Anemia, unspecified: Secondary | ICD-10-CM | POA: Diagnosis not present

## 2017-03-20 DIAGNOSIS — R232 Flushing: Secondary | ICD-10-CM | POA: Diagnosis not present

## 2017-03-20 DIAGNOSIS — Z79818 Long term (current) use of other agents affecting estrogen receptors and estrogen levels: Secondary | ICD-10-CM | POA: Diagnosis not present

## 2017-03-20 DIAGNOSIS — R5383 Other fatigue: Secondary | ICD-10-CM | POA: Diagnosis not present

## 2017-03-20 DIAGNOSIS — E785 Hyperlipidemia, unspecified: Secondary | ICD-10-CM | POA: Diagnosis not present

## 2017-03-20 DIAGNOSIS — Z923 Personal history of irradiation: Secondary | ICD-10-CM | POA: Diagnosis not present

## 2017-03-20 DIAGNOSIS — R0609 Other forms of dyspnea: Secondary | ICD-10-CM | POA: Diagnosis not present

## 2017-03-20 DIAGNOSIS — C61 Malignant neoplasm of prostate: Secondary | ICD-10-CM | POA: Diagnosis not present

## 2017-03-20 DIAGNOSIS — I10 Essential (primary) hypertension: Secondary | ICD-10-CM | POA: Diagnosis not present

## 2017-03-20 LAB — PREPARE RBC (CROSSMATCH)

## 2017-03-20 MED ORDER — DIPHENHYDRAMINE HCL 25 MG PO CAPS
25.0000 mg | ORAL_CAPSULE | Freq: Once | ORAL | Status: AC
  Administered 2017-03-20: 25 mg via ORAL

## 2017-03-20 MED ORDER — ACETAMINOPHEN 325 MG PO TABS
650.0000 mg | ORAL_TABLET | Freq: Once | ORAL | Status: AC
  Administered 2017-03-20: 650 mg via ORAL

## 2017-03-20 MED ORDER — SODIUM CHLORIDE 0.9 % IV SOLN
250.0000 mL | Freq: Once | INTRAVENOUS | Status: AC
  Administered 2017-03-20: 250 mL via INTRAVENOUS
  Filled 2017-03-20: qty 250

## 2017-03-21 LAB — TYPE AND SCREEN
ABO/RH(D): A POS
ANTIBODY SCREEN: NEGATIVE
Unit division: 0

## 2017-03-21 LAB — BPAM RBC
Blood Product Expiration Date: 201811162359
ISSUE DATE / TIME: 201810290935
Unit Type and Rh: 6200

## 2017-03-28 ENCOUNTER — Encounter: Payer: Self-pay | Admitting: Internal Medicine

## 2017-03-31 ENCOUNTER — Inpatient Hospital Stay (HOSPITAL_BASED_OUTPATIENT_CLINIC_OR_DEPARTMENT_OTHER): Payer: Medicare PPO | Admitting: Internal Medicine

## 2017-03-31 ENCOUNTER — Inpatient Hospital Stay: Payer: Medicare PPO | Attending: Internal Medicine

## 2017-03-31 VITALS — BP 180/66 | HR 104 | Temp 98.4°F | Resp 16 | Wt 150.6 lb

## 2017-03-31 DIAGNOSIS — K589 Irritable bowel syndrome without diarrhea: Secondary | ICD-10-CM

## 2017-03-31 DIAGNOSIS — C61 Malignant neoplasm of prostate: Secondary | ICD-10-CM

## 2017-03-31 DIAGNOSIS — D649 Anemia, unspecified: Secondary | ICD-10-CM | POA: Insufficient documentation

## 2017-03-31 DIAGNOSIS — Z79818 Long term (current) use of other agents affecting estrogen receptors and estrogen levels: Secondary | ICD-10-CM | POA: Insufficient documentation

## 2017-03-31 DIAGNOSIS — R5383 Other fatigue: Secondary | ICD-10-CM | POA: Insufficient documentation

## 2017-03-31 DIAGNOSIS — I10 Essential (primary) hypertension: Secondary | ICD-10-CM | POA: Insufficient documentation

## 2017-03-31 DIAGNOSIS — Z86010 Personal history of colonic polyps: Secondary | ICD-10-CM | POA: Insufficient documentation

## 2017-03-31 DIAGNOSIS — Z79899 Other long term (current) drug therapy: Secondary | ICD-10-CM

## 2017-03-31 DIAGNOSIS — K5900 Constipation, unspecified: Secondary | ICD-10-CM | POA: Insufficient documentation

## 2017-03-31 DIAGNOSIS — Z7982 Long term (current) use of aspirin: Secondary | ICD-10-CM

## 2017-03-31 DIAGNOSIS — H409 Unspecified glaucoma: Secondary | ICD-10-CM | POA: Diagnosis not present

## 2017-03-31 DIAGNOSIS — Z923 Personal history of irradiation: Secondary | ICD-10-CM

## 2017-03-31 DIAGNOSIS — D500 Iron deficiency anemia secondary to blood loss (chronic): Secondary | ICD-10-CM

## 2017-03-31 DIAGNOSIS — Z87891 Personal history of nicotine dependence: Secondary | ICD-10-CM

## 2017-03-31 DIAGNOSIS — K219 Gastro-esophageal reflux disease without esophagitis: Secondary | ICD-10-CM

## 2017-03-31 DIAGNOSIS — R0609 Other forms of dyspnea: Secondary | ICD-10-CM

## 2017-03-31 DIAGNOSIS — E785 Hyperlipidemia, unspecified: Secondary | ICD-10-CM | POA: Diagnosis not present

## 2017-03-31 LAB — CBC WITH DIFFERENTIAL/PLATELET
Basophils Absolute: 0 10*3/uL (ref 0–0.1)
Basophils Relative: 0 %
EOS ABS: 0.1 10*3/uL (ref 0–0.7)
EOS PCT: 1 %
HCT: 28.2 % — ABNORMAL LOW (ref 40.0–52.0)
Hemoglobin: 8.9 g/dL — ABNORMAL LOW (ref 13.0–18.0)
LYMPHS ABS: 1.2 10*3/uL (ref 1.0–3.6)
LYMPHS PCT: 17 %
MCH: 24.9 pg — AB (ref 26.0–34.0)
MCHC: 31.4 g/dL — AB (ref 32.0–36.0)
MCV: 79.3 fL — ABNORMAL LOW (ref 80.0–100.0)
MONO ABS: 0.4 10*3/uL (ref 0.2–1.0)
Monocytes Relative: 6 %
Neutro Abs: 5.5 10*3/uL (ref 1.4–6.5)
Neutrophils Relative %: 76 %
PLATELETS: 367 10*3/uL (ref 150–440)
RBC: 3.56 MIL/uL — ABNORMAL LOW (ref 4.40–5.90)
RDW: 18.9 % — AB (ref 11.5–14.5)
WBC: 7.2 10*3/uL (ref 3.8–10.6)

## 2017-03-31 LAB — SAMPLE TO BLOOD BANK

## 2017-03-31 LAB — FOLATE: FOLATE: 12.4 ng/mL (ref >5.9)

## 2017-03-31 LAB — VITAMIN B12: VITAMIN B 12: 465 pg/mL (ref 180–914)

## 2017-03-31 NOTE — Assessment & Plan Note (Addendum)
#   Anemia- Hb 7.8 worsening/symptomatic;oct 2018- BMBx- mildly hypercellular ? Low grade MDS; awaiting on FISH studies. N-b12/folic acid. will check copper/zinc at next labs.   #Question concomitant iron deficiency-iron saturation 9%; ferritin 227.  Recommend IV iron Venofer weekly x4.   # Discussed the patient with concerns for underlying low-grade myelodysplastic syndrome causing his anemia/fatigue.  Also discussed the treatment options include Aranesp/IV iron; hypomethylating agents etc. However we'll await on fish testing for further recommendations.  # Metastatic prostate cancer Hormone sensitive- biochemical recurrence [M0]- given the rapid doubling time of PSA patient on- ADT every 3 months. PSA  October 2018- 0.9 Tolerating ADT well.   # constipation- ? Last colo- 2016- twisted colo [Dr.Daniels]; recommend miralax..   # 2 weeks- labs-cbc/hold tube/copper-zinc. 4 weeks/md/cbc-hold tube; venofer weeklyx 4.   The above plan of care was discussed with the patient Cc; Dr.Guitrrez.   Addendum: Karyotype--no major abnormalities.;  FISH panel pending.

## 2017-03-31 NOTE — Progress Notes (Signed)
Patient is here for a follow up.

## 2017-03-31 NOTE — Progress Notes (Signed)
[place] NOTE  Patient Care Team: [name], MD as PCP - General (Family Medicine) Bond, Tracie Harrier, MD as Consulting Physician (Ophthalmology)  CHIEF COMPLAINTS/PURPOSE OF CONSULTATION:  Oncology History   # external beam radiation therapy and radiation seed implant in 2005 with Dr. Joelyn Oms and Dr. Danny Lawless for T1c Gleason 3+4 = 7 prostate cancer with a pretreatment PSA of 6.8. In November 2015, he was found to have biochemical recurrence.  Dec 2016- CT a/p & Bone scan- NEG; PSA- 11; June 2017- 19; STARTED on Firmagon [Dr.Budzyn]; on Lupron q 3M  # OCT 2018- BMBx- hypercellular 30%; relative myeloid hyperplasia/erythroid hypoplasia; FISH pending  # JAN 2018- Dexa scan- N     Primary adenocarcinoma of prostate ([place])     HISTORY OF PRESENTING ILLNESS:  Thomas Lopez 83 y.o.  male very pleasant patient with above history of prostate cancer - Currently with recurrent prostate cancer/biochemical recurrence- hormone sensitive on ADT; and also History of worsening anemia is for a follow-up.  Patient had blood transfusion approximately 2 weeks ago.  He noted to have improvement of his energy levels.  However he started to notice being more tired in the last few days.  He continues to deny any blood in stools or black colored stools.  Denies any bone pain. Has hot flashes which are stable. No significant weight loss no new constipation.   ROS: A complete 10 point review of system is done which is negative except mentioned above in history of present illness  MEDICAL HISTORY:  Past Medical History:  Diagnosis Date  . Aorto-iliac atherosclerosis ([place]) 04/2015   by CT scan  . Carotid stenosis 11/2014   mild bilateral 1-39%, rpt 2 yrs  . Colon polyps    rpt colonoscopy due 2014  . ED (erectile dysfunction)    Trimix and VED  . Esophagitis   . Essential hypertension 06/29/2009  . GERD (gastroesophageal reflux disease)   . Glaucoma    severe ([place])  .  HLD (hyperlipidemia)   . HTN (hypertension)   . Hyperglycemia   . IBS (irritable bowel syndrome)   . Prostate cancer [facility]) 2005   s/p seed implant, EBRT (Dr. Alinda Money at [facility]) recurrent treating with androgen deprivation referred to cancer center  . Smoking history     SURGICAL HISTORY: Past Surgical History:  Procedure Laterality Date  . CATARACT EXTRACTION, BILATERAL  December 2016   [place], Dr. Raynelle Fanning  . COLONOSCOPY  03/01/01   Multiple polyps, divertic//adenomatous polyps  . COLONOSCOPY  04/24/01   multiple polyps  . COLONOSCOPY  10/23/01   Polyps benign-repeat every 2 years  . COLONOSCOPY  06/23/04   Adenom/hyperplastic polyps; multiple external hemorrhoids  . COLONOSCOPY  06/08/07   single small polyp-repeat 5 years  . ESOPHAGOGASTRODUODENOSCOPY  03/01/01   H.H.; gastritis esoph. dudodenitis  . NCS/Wrists Left Carpal  2/02   Min./right improved  . PFTs  10/2012   FVC 64%, FEV1 41%, ratio 0.62  . PROSTATE BIOPSY  12/04   positive; radioactive seed implant (Dr. Joelyn Oms)  . Prostate Ext Beam Radiation  1/27-07/23/03   Dr. Danny Lawless  . SPIROMETRY  09/2011   WNL  . Wrist Release  6/98; 9/99   Right    SOCIAL HISTORY: Social History   Socioeconomic History  . Marital status: Legally Separated    Spouse name: Not on file  . Number of children: 5  . Years of education: Not on file  . Highest  education level: Not on file  Social Needs  . Financial resource strain: Not on file  . Food insecurity - worry: Not on file  . Food insecurity - inability: Not on file  . Transportation needs - medical: Not on file  . Transportation needs - non-medical: Not on file  Occupational History  . Occupation: Retired  Tobacco Use  . Smoking status: Former Smoker    Last attempt to quit: 05/23/1993    Years since quitting: 23.8  . Smokeless tobacco: Never Used  Substance and Sexual Activity  . Alcohol use: Yes    Comment: Rare  . Drug use: No  . Sexual activity:  Yes  Other Topics Concern  . 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 Cancer Mother        colon  . Hyperlipidemia Mother   . Other Mother        Carotid disease  . Coronary artery disease Neg Hx   . Stroke Neg Hx     ALLERGIES:  has No Known Allergies.  MEDICATIONS:  Current Outpatient Medications  Medication Sig Dispense Refill  . aspirin EC 81 MG tablet Take 81 mg by mouth daily.    . dorzolamide-timolol (COSOPT) 22.3-6.8 MG/ML ophthalmic solution As directed    . latanoprost (XALATAN) 0.005 % ophthalmic solution Place 1 drop into both eyes at bedtime.    Marland Kitchen leuprolide (LUPRON) 22.5 MG injection Inject 22.5 mg into the muscle every 3 (three) months.    Marland Kitchen lisinopril (PRINIVIL,ZESTRIL) 10 MG tablet Take 1 tablet (10 mg total) by mouth daily. 90 tablet 3  . methylcellulose packet Take 1 each by mouth as needed for constipation. Reported on 11/05/2015    . omeprazole (PRILOSEC) 40 MG capsule Take 1 capsule (40 mg total) by mouth daily. 90 capsule 3  . rosuvastatin (CRESTOR) 10 MG tablet Take 1 tablet (10 mg total) by mouth daily. 90 tablet 3  . venlafaxine XR (EFFEXOR-XR) 37.5 MG 24 hr capsule Take 1 capsule (37.5 mg total) by mouth daily with breakfast. 90 capsule 3   No current facility-administered medications for this visit.       Marland Kitchen  PHYSICAL EXAMINATION: ECOG PERFORMANCE STATUS: 0 - Asymptomatic  Vitals:   03/31/17 1351  BP: (!) 180/66  Pulse: (!) 104  Resp: 16  Temp: 98.4 F (36.9 C)   Filed Weights   03/31/17 1351  Weight: 150 lb 9.6 oz (68.3 kg)    GENERAL: Well-nourished well-developed; Alert, no distress and comfortable.  He is alone. EYES: no pallor or icterus OROPHARYNX: no thrush or ulceration; good  dentition  NECK: supple, no masses felt LYMPH:  no palpable lymphadenopathy in the cervical, axillary or inguinal regions LUNGS: clear to auscultation and  No wheeze or crackles HEART/CVS: regular rate & rhythm and no murmurs; No lower extremity edema ABDOMEN: abdomen soft, non-tender and normal bowel sounds Musculoskeletal:no cyanosis of digits and no clubbing  PSYCH: alert & oriented x 3 with fluent speech NEURO: no focal motor/sensory deficits SKIN:  no rashes or significant lesions  Results for Thomas Lopez, Thomas Lopez (MRN [id number]) as of 03/01/2017 15:03  Ref. Range 12/03/2015 09:45 03/03/2016 09:35 06/02/2016 09:26 08/29/2016 09:45 02/27/2017 14:21  PSA Latest Ref Range: 0.00 -  4.00 ng/mL 2.40 0.77 0.60 0.58   Prostatic Specific Antigen Latest Ref Range: 0.00 - 4.00 ng/mL     0.97    LABORATORY DATA:  I have reviewed the data as listed Lab Results  Component Value Date   WBC 7.2 03/31/2017   HGB 8.9 (L) 03/31/2017   HCT 28.2 (L) 03/31/2017   MCV 79.3 (L) 03/31/2017   PLT 367 03/31/2017   Recent Labs    06/02/16 0926 08/29/16 0945 11/28/16 1335 02/27/17 1427  NA 133* 135 137 138  K 4.4 3.7 4.3 3.7  CL 103 101 103 102  CO2 26 25 26 27   GLUCOSE 158* 149* 97 164*  BUN 42* 34* 25* 29*  CREATININE 1.04 1.06 0.95 1.07  CALCIUM 9.4 9.4 9.6 9.4  GFRNONAA >60 >60 >60 >60  GFRAA >60 >60 >60 >60  PROT 7.5 7.6  --  8.2*  ALBUMIN 4.2 3.7  --  3.2*  AST 30 20  --  16  ALT 23 14*  --  13*  ALKPHOS 55 61  --  62  BILITOT 0.6 0.6  --  0.3    RADIOGRAPHIC STUDIES: I have personally reviewed the radiological images as listed and agreed with the findings in the report. Ct Biopsy  Result Date: 03/08/2017 INDICATION: 83 year old male with a history of prostate cancer and normocytic anemia. EXAM: CT GUIDED BONE MARROW ASPIRATION AND CORE BIOPSY Interventional Radiologist:  Criselda Peaches, MD MEDICATIONS: None. ANESTHESIA/SEDATION: Moderate (conscious) sedation was employed during this  procedure. A total of 1.5 milligrams versed and 50 micrograms fentanyl were administered intravenously. The patient's level of consciousness and vital signs were monitored continuously by radiology nursing throughout the procedure under my direct supervision. Total monitored sedation time: 16 minutes FLUOROSCOPY TIME:  Fluoroscopy Time: 0 minutes 0 seconds (0 mGy). COMPLICATIONS: None immediate. Estimated blood loss: <25 mL PROCEDURE: Informed written consent was obtained from the patient after a thorough discussion of the procedural risks, benefits and alternatives. All questions were addressed. Maximal Sterile Barrier Technique was utilized including caps, mask, sterile gowns, sterile gloves, sterile drape, hand hygiene and skin antiseptic. A timeout was performed prior to the initiation of the procedure. The patient was positioned prone and non-contrast localization CT was performed of the pelvis to demonstrate the iliac marrow spaces. Maximal barrier sterile technique utilized including caps, mask, sterile gowns, sterile gloves, large sterile drape, hand hygiene, and betadine prep. Under sterile conditions and local anesthesia, an 11 gauge coaxial bone biopsy needle was advanced into the right iliac marrow space. Needle position was confirmed with CT imaging. Initially, bone marrow aspiration was performed. Next, the 11 gauge outer cannula was utilized to obtain a right iliac bone marrow core biopsy. Needle was removed. Hemostasis was obtained with compression. The patient tolerated the procedure well. Samples were prepared with the cytotechnologist. IMPRESSION: Technically successful CT-guided core biopsy and aspiration of right iliac bone marrow. Electronically Signed   By: Jacqulynn Cadet M.D.   On: 03/08/2017 11:39    ASSESSMENT & PLAN:   Normocytic anemia # Anemia- Hb 7.8 worsening/symptomatic;oct 2018- BMBx- mildly hypercellular ? Low grade MDS; awaiting on FISH studies. N-b12/folic acid. will  check copper/zinc at next labs.   #Question concomitant iron deficiency-iron saturation 9%; ferritin 227.  Recommend IV iron Venofer weekly x4.   # Discussed the patient with concerns for underlying low-grade myelodysplastic syndrome causing his anemia/fatigue.  Also discussed the treatment options include Aranesp/IV iron; hypomethylating agents etc. However we'll await on fish testing  for further recommendations.  # Metastatic prostate cancer Hormone sensitive- biochemical recurrence [M0]- given the rapid doubling time of PSA patient on- ADT every 3 months. PSA  October 2018- 0.9 Tolerating ADT well.   # constipation- ? Last colo- 2016- twisted colo [Dr.Daniels]; recommend miralax..   # 2 weeks- labs-cbc/hold tube/copper-zinc. 4 weeks/md/cbc-hold tube; venofer weeklyx 4.   The above plan of care was discussed with the patient Cc; Dr.Guitrrez.   Addendum: Karyotype--no major abnormalities.;  FISH panel pending.     Cammie Sickle, MD 04/04/2017 4:29 PM

## 2017-04-04 ENCOUNTER — Encounter (HOSPITAL_COMMUNITY): Payer: Self-pay

## 2017-04-05 ENCOUNTER — Inpatient Hospital Stay: Payer: Medicare PPO

## 2017-04-05 VITALS — BP 120/65 | HR 64 | Temp 96.5°F | Resp 18

## 2017-04-05 DIAGNOSIS — I10 Essential (primary) hypertension: Secondary | ICD-10-CM | POA: Diagnosis not present

## 2017-04-05 DIAGNOSIS — C61 Malignant neoplasm of prostate: Secondary | ICD-10-CM

## 2017-04-05 DIAGNOSIS — K219 Gastro-esophageal reflux disease without esophagitis: Secondary | ICD-10-CM | POA: Diagnosis not present

## 2017-04-05 DIAGNOSIS — Z79818 Long term (current) use of other agents affecting estrogen receptors and estrogen levels: Secondary | ICD-10-CM | POA: Diagnosis not present

## 2017-04-05 DIAGNOSIS — Z923 Personal history of irradiation: Secondary | ICD-10-CM | POA: Diagnosis not present

## 2017-04-05 DIAGNOSIS — D649 Anemia, unspecified: Secondary | ICD-10-CM | POA: Diagnosis not present

## 2017-04-05 DIAGNOSIS — K5900 Constipation, unspecified: Secondary | ICD-10-CM | POA: Diagnosis not present

## 2017-04-05 DIAGNOSIS — R5383 Other fatigue: Secondary | ICD-10-CM | POA: Diagnosis not present

## 2017-04-05 DIAGNOSIS — E785 Hyperlipidemia, unspecified: Secondary | ICD-10-CM | POA: Diagnosis not present

## 2017-04-05 LAB — TISSUE HYBRIDIZATION (BONE MARROW)-NCBH

## 2017-04-05 LAB — CHROMOSOME ANALYSIS, BONE MARROW

## 2017-04-05 MED ORDER — IRON SUCROSE 20 MG/ML IV SOLN
200.0000 mg | Freq: Once | INTRAVENOUS | Status: AC
  Administered 2017-04-05: 200 mg via INTRAVENOUS
  Filled 2017-04-05: qty 10

## 2017-04-05 MED ORDER — SODIUM CHLORIDE 0.9 % IV SOLN
Freq: Once | INTRAVENOUS | Status: AC
  Administered 2017-04-05: 14:00:00 via INTRAVENOUS
  Filled 2017-04-05: qty 1000

## 2017-04-06 ENCOUNTER — Telehealth: Payer: Self-pay | Admitting: Internal Medicine

## 2017-04-06 NOTE — Telephone Encounter (Signed)
Spoke to path at [place] re: [place]; will be faxing the results.

## 2017-04-11 ENCOUNTER — Encounter (HOSPITAL_COMMUNITY): Payer: Self-pay

## 2017-04-12 ENCOUNTER — Inpatient Hospital Stay: Payer: Medicare PPO

## 2017-04-12 VITALS — BP 108/65 | HR 60 | Temp 96.5°F | Resp 16

## 2017-04-12 DIAGNOSIS — Z79818 Long term (current) use of other agents affecting estrogen receptors and estrogen levels: Secondary | ICD-10-CM | POA: Diagnosis not present

## 2017-04-12 DIAGNOSIS — R5383 Other fatigue: Secondary | ICD-10-CM | POA: Diagnosis not present

## 2017-04-12 DIAGNOSIS — E785 Hyperlipidemia, unspecified: Secondary | ICD-10-CM | POA: Diagnosis not present

## 2017-04-12 DIAGNOSIS — D649 Anemia, unspecified: Secondary | ICD-10-CM | POA: Diagnosis not present

## 2017-04-12 DIAGNOSIS — I10 Essential (primary) hypertension: Secondary | ICD-10-CM | POA: Diagnosis not present

## 2017-04-12 DIAGNOSIS — Z923 Personal history of irradiation: Secondary | ICD-10-CM | POA: Diagnosis not present

## 2017-04-12 DIAGNOSIS — K219 Gastro-esophageal reflux disease without esophagitis: Secondary | ICD-10-CM | POA: Diagnosis not present

## 2017-04-12 DIAGNOSIS — K5900 Constipation, unspecified: Secondary | ICD-10-CM | POA: Diagnosis not present

## 2017-04-12 DIAGNOSIS — C61 Malignant neoplasm of prostate: Secondary | ICD-10-CM | POA: Diagnosis not present

## 2017-04-12 LAB — CBC WITH DIFFERENTIAL/PLATELET
Basophils Absolute: 0 10*3/uL (ref 0–0.1)
Basophils Relative: 0 %
EOS PCT: 1 %
Eosinophils Absolute: 0 10*3/uL (ref 0–0.7)
HEMATOCRIT: 27.4 % — AB (ref 40.0–52.0)
HEMOGLOBIN: 8.6 g/dL — AB (ref 13.0–18.0)
LYMPHS ABS: 0.9 10*3/uL — AB (ref 1.0–3.6)
LYMPHS PCT: 13 %
MCH: 24.8 pg — AB (ref 26.0–34.0)
MCHC: 31.3 g/dL — ABNORMAL LOW (ref 32.0–36.0)
MCV: 79.3 fL — AB (ref 80.0–100.0)
Monocytes Absolute: 0.5 10*3/uL (ref 0.2–1.0)
Monocytes Relative: 7 %
Neutro Abs: 5.6 10*3/uL (ref 1.4–6.5)
Neutrophils Relative %: 79 %
PLATELETS: 350 10*3/uL (ref 150–440)
RBC: 3.45 MIL/uL — AB (ref 4.40–5.90)
RDW: 19.8 % — ABNORMAL HIGH (ref 11.5–14.5)
WBC: 7.1 10*3/uL (ref 3.8–10.6)

## 2017-04-12 LAB — SAMPLE TO BLOOD BANK

## 2017-04-12 MED ORDER — SODIUM CHLORIDE 0.9 % IV SOLN
Freq: Once | INTRAVENOUS | Status: AC
  Administered 2017-04-12: 14:00:00 via INTRAVENOUS
  Filled 2017-04-12: qty 1000

## 2017-04-12 MED ORDER — IRON SUCROSE 20 MG/ML IV SOLN
200.0000 mg | Freq: Once | INTRAVENOUS | Status: AC
  Administered 2017-04-12: 200 mg via INTRAVENOUS
  Filled 2017-04-12: qty 10

## 2017-04-13 LAB — ZINC: ZINC: 95 ug/dL (ref 56–134)

## 2017-04-13 LAB — COPPER, SERUM: COPPER: 142 ug/dL (ref 72–166)

## 2017-04-19 ENCOUNTER — Inpatient Hospital Stay: Payer: Medicare PPO

## 2017-04-19 VITALS — BP 151/76 | HR 49 | Temp 96.5°F | Resp 18

## 2017-04-19 DIAGNOSIS — C61 Malignant neoplasm of prostate: Secondary | ICD-10-CM

## 2017-04-19 DIAGNOSIS — K219 Gastro-esophageal reflux disease without esophagitis: Secondary | ICD-10-CM | POA: Diagnosis not present

## 2017-04-19 DIAGNOSIS — K5900 Constipation, unspecified: Secondary | ICD-10-CM | POA: Diagnosis not present

## 2017-04-19 DIAGNOSIS — R5383 Other fatigue: Secondary | ICD-10-CM | POA: Diagnosis not present

## 2017-04-19 DIAGNOSIS — I10 Essential (primary) hypertension: Secondary | ICD-10-CM | POA: Diagnosis not present

## 2017-04-19 DIAGNOSIS — E785 Hyperlipidemia, unspecified: Secondary | ICD-10-CM | POA: Diagnosis not present

## 2017-04-19 DIAGNOSIS — Z79818 Long term (current) use of other agents affecting estrogen receptors and estrogen levels: Secondary | ICD-10-CM | POA: Diagnosis not present

## 2017-04-19 DIAGNOSIS — D649 Anemia, unspecified: Secondary | ICD-10-CM | POA: Diagnosis not present

## 2017-04-19 DIAGNOSIS — Z923 Personal history of irradiation: Secondary | ICD-10-CM | POA: Diagnosis not present

## 2017-04-19 MED ORDER — SODIUM CHLORIDE 0.9 % IV SOLN
Freq: Once | INTRAVENOUS | Status: AC
  Administered 2017-04-19: 14:00:00 via INTRAVENOUS
  Filled 2017-04-19: qty 1000

## 2017-04-19 MED ORDER — IRON SUCROSE 20 MG/ML IV SOLN
200.0000 mg | Freq: Once | INTRAVENOUS | Status: AC
  Administered 2017-04-19: 200 mg via INTRAVENOUS
  Filled 2017-04-19: qty 10

## 2017-04-26 ENCOUNTER — Telehealth: Payer: Self-pay | Admitting: Internal Medicine

## 2017-04-26 ENCOUNTER — Inpatient Hospital Stay: Payer: Medicare PPO | Attending: Internal Medicine | Admitting: Internal Medicine

## 2017-04-26 ENCOUNTER — Inpatient Hospital Stay: Payer: Medicare PPO

## 2017-04-26 ENCOUNTER — Other Ambulatory Visit: Payer: Self-pay | Admitting: *Deleted

## 2017-04-26 VITALS — BP 169/80 | HR 61 | Temp 98.2°F | Resp 16 | Wt 154.0 lb

## 2017-04-26 VITALS — BP 160/78 | HR 60 | Resp 18

## 2017-04-26 DIAGNOSIS — C61 Malignant neoplasm of prostate: Secondary | ICD-10-CM

## 2017-04-26 DIAGNOSIS — Z7982 Long term (current) use of aspirin: Secondary | ICD-10-CM | POA: Insufficient documentation

## 2017-04-26 DIAGNOSIS — I10 Essential (primary) hypertension: Secondary | ICD-10-CM | POA: Diagnosis not present

## 2017-04-26 DIAGNOSIS — Z923 Personal history of irradiation: Secondary | ICD-10-CM | POA: Diagnosis not present

## 2017-04-26 DIAGNOSIS — E785 Hyperlipidemia, unspecified: Secondary | ICD-10-CM | POA: Insufficient documentation

## 2017-04-26 DIAGNOSIS — D649 Anemia, unspecified: Secondary | ICD-10-CM

## 2017-04-26 DIAGNOSIS — Z79899 Other long term (current) drug therapy: Secondary | ICD-10-CM | POA: Diagnosis not present

## 2017-04-26 DIAGNOSIS — Z79818 Long term (current) use of other agents affecting estrogen receptors and estrogen levels: Secondary | ICD-10-CM | POA: Diagnosis not present

## 2017-04-26 DIAGNOSIS — Z87891 Personal history of nicotine dependence: Secondary | ICD-10-CM | POA: Diagnosis not present

## 2017-04-26 DIAGNOSIS — K219 Gastro-esophageal reflux disease without esophagitis: Secondary | ICD-10-CM | POA: Diagnosis not present

## 2017-04-26 DIAGNOSIS — H409 Unspecified glaucoma: Secondary | ICD-10-CM

## 2017-04-26 DIAGNOSIS — Z86010 Personal history of colonic polyps: Secondary | ICD-10-CM | POA: Diagnosis not present

## 2017-04-26 DIAGNOSIS — K589 Irritable bowel syndrome without diarrhea: Secondary | ICD-10-CM | POA: Insufficient documentation

## 2017-04-26 LAB — SAMPLE TO BLOOD BANK

## 2017-04-26 LAB — CBC WITH DIFFERENTIAL/PLATELET
BASOS ABS: 0 10*3/uL (ref 0–0.1)
BASOS PCT: 0 %
Eosinophils Absolute: 0.1 10*3/uL (ref 0–0.7)
Eosinophils Relative: 2 %
HEMATOCRIT: 25.4 % — AB (ref 40.0–52.0)
HEMOGLOBIN: 7.8 g/dL — AB (ref 13.0–18.0)
Lymphocytes Relative: 12 %
Lymphs Abs: 0.8 10*3/uL — ABNORMAL LOW (ref 1.0–3.6)
MCH: 24.4 pg — ABNORMAL LOW (ref 26.0–34.0)
MCHC: 30.9 g/dL — ABNORMAL LOW (ref 32.0–36.0)
MCV: 79 fL — ABNORMAL LOW (ref 80.0–100.0)
Monocytes Absolute: 0.4 10*3/uL (ref 0.2–1.0)
Monocytes Relative: 6 %
NEUTROS ABS: 5.6 10*3/uL (ref 1.4–6.5)
NEUTROS PCT: 80 %
Platelets: 393 10*3/uL (ref 150–440)
RBC: 3.21 MIL/uL — AB (ref 4.40–5.90)
RDW: 20.4 % — AB (ref 11.5–14.5)
WBC: 7 10*3/uL (ref 3.8–10.6)

## 2017-04-26 LAB — PREPARE RBC (CROSSMATCH)

## 2017-04-26 MED ORDER — SODIUM CHLORIDE 0.9 % IV SOLN
Freq: Once | INTRAVENOUS | Status: AC
  Administered 2017-04-26: 14:00:00 via INTRAVENOUS
  Filled 2017-04-26: qty 1000

## 2017-04-26 MED ORDER — IRON SUCROSE 20 MG/ML IV SOLN
200.0000 mg | Freq: Once | INTRAVENOUS | Status: AC
  Administered 2017-04-26: 200 mg via INTRAVENOUS
  Filled 2017-04-26: qty 10

## 2017-04-26 NOTE — Telephone Encounter (Signed)
psa lab order entered per md

## 2017-04-26 NOTE — Assessment & Plan Note (Addendum)
#  Anemia- Hb 7.8 worsening/symptomatic;oct 2018- BMBx- mildly hypercellular ? Low grade MDS; MDS FISH-panel normal.  N-b12/folic acid/copper zinc levels.  #No significant improvement noted on IV iron-proceed with treatment #4 today.  Hemoglobin 7.8.  Percent with PRBC transfusion 1-2 days.  #I discussed my continued concerns for low-grade myelodysplastic syndrome which is not clearly evident on the recent bone marrow biopsy.   Also discussed possibility for repeat bone marrow biopsy-this time with extended NGS panel.   # Metastatic prostate cancer Hormone sensitive- biochemical recurrence [M0]- given the rapid doubling time of PSA patient on- ADT every 3 months. PSA  October 2018- 0.9 Tolerating ADT well.  Plan Lupron in January 2019.  # order rectic/ldh/hapto tomorrow; plan transfusion; follow up with me in 4 weeks/hold tube/ cbc/ possible.  Also add Lupron/PSA at next visit.

## 2017-04-26 NOTE — Telephone Encounter (Signed)
Please schedule the patient for Lupron next visit in January;   Also order PSA at that viist. Thx

## 2017-04-26 NOTE — Progress Notes (Signed)
[place] NOTE  Patient Care Team: [name], MD as PCP - General (Family Medicine) Bond, Tracie Harrier, MD as Consulting Physician (Ophthalmology)  CHIEF COMPLAINTS/PURPOSE OF CONSULTATION:  Oncology History   # external beam radiation therapy and radiation seed implant in 2005 with Dr. Joelyn Oms and Dr. Danny Lawless for T1c Gleason 3+4 = 7 prostate cancer with a pretreatment PSA of 6.8. In November 2015, he was found to have biochemical recurrence.  Dec 2016- CT a/p & Bone scan- NEG; PSA- 11; June 2017- 19; STARTED on Firmagon [Dr.Budzyn]; on Lupron q 73M  # OCT 2018- BMBx- hypercellular 30%; relative myeloid hyperplasia/erythroid hypoplasia; FISH- 5/7/8-Normal.  # JAN 2018- Dexa scan- N     Primary adenocarcinoma of prostate ([place])     HISTORY OF PRESENTING ILLNESS:  [name] 83 y.o.  male very pleasant patient with anemia unclear etiology-currently on IV iron; also history of prostate cancer/with biochemical recurrence on ADT is here for follow-up.  Patient received her last blood transfusion approximately 2 weeks ago.  He noted to have improvement of his energy levels.  However he started to notice being more tired in the last few days.  He continues to deny any blood in stools or black colored stools.  Denies any bone pain. Has hot flashes which are stable. No significant weight loss no new constipation.   ROS: A complete 10 point review of system is done which is negative except mentioned above in history of present illness  MEDICAL HISTORY:  Past Medical History:  Diagnosis Date  . Aorto-iliac atherosclerosis ([place]) 04/2015   by CT scan  . Carotid stenosis 11/2014   mild bilateral 1-39%, rpt 2 yrs  . Colon polyps    rpt colonoscopy due 2014  . ED (erectile dysfunction)    Trimix and VED  . Esophagitis   . Essential hypertension 06/29/2009  . GERD (gastroesophageal reflux disease)   . Glaucoma    severe ([place])  . HLD (hyperlipidemia)   .  HTN (hypertension)   . Hyperglycemia   . IBS (irritable bowel syndrome)   . Prostate cancer [facility]) 2005   s/p seed implant, EBRT (Dr. Alinda [facility] at [facility]) recurrent treating with androgen deprivation referred to cancer center  . Smoking history     SURGICAL HISTORY: Past Surgical History:  Procedure Laterality Date  . CATARACT EXTRACTION, BILATERAL  December 2016   [place], Dr. Raynelle Fanning  . COLONOSCOPY  03/01/01   Multiple polyps, divertic//adenomatous polyps  . COLONOSCOPY  04/24/01   multiple polyps  . COLONOSCOPY  10/23/01   Polyps benign-repeat every 2 years  . COLONOSCOPY  06/23/04   Adenom/hyperplastic polyps; multiple external hemorrhoids  . COLONOSCOPY  06/08/07   single small polyp-repeat 5 years  . ESOPHAGOGASTRODUODENOSCOPY  03/01/01   H.H.; gastritis esoph. dudodenitis  . NCS/Wrists Left Carpal  2/02   Min./right improved  . PFTs  10/2012   FVC 64%, FEV1 41%, ratio 0.62  . PROSTATE BIOPSY  12/04   positive; radioactive seed implant (Dr. Joelyn Oms)  . Prostate Ext Beam Radiation  1/27-07/23/03   Dr. Danny Lawless  . SPIROMETRY  09/2011   WNL  . Wrist Release  6/98; 9/99   Right    SOCIAL HISTORY: Social History   Socioeconomic History  . Marital status: Legally Separated    Spouse name: Not on file  . Number of children: 5  . Years of education: Not on file  . Highest education level: Not on file  Social Needs  . Financial resource strain: Not on file  . Food insecurity - worry: Not on file  . Food insecurity - inability: Not on file  . Transportation needs - medical: Not on file  . Transportation needs - non-medical: Not on file  Occupational History  . Occupation: Retired  Tobacco Use  . Smoking status: Former Smoker    Last attempt to quit: 05/23/1993    Years since quitting: 23.9  . Smokeless tobacco: Never Used  Substance and Sexual Activity  . Alcohol use: Yes    Comment: Rare  . Drug use: No  . Sexual activity: Yes  Other Topics Concern   . 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 Cancer Mother        colon  . Hyperlipidemia Mother   . Other Mother        Carotid disease  . Coronary artery disease Neg Hx   . Stroke Neg Hx     ALLERGIES:  has No Known Allergies.  MEDICATIONS:  Current Outpatient Medications  Medication Sig Dispense Refill  . aspirin EC 81 MG tablet Take 81 mg by mouth daily.    . dorzolamide-timolol (COSOPT) 22.3-6.8 MG/ML ophthalmic solution As directed    . latanoprost (XALATAN) 0.005 % ophthalmic solution Place 1 drop into both eyes at bedtime.    Marland Kitchen leuprolide (LUPRON) 22.5 MG injection Inject 22.5 mg into the muscle every 3 (three) months.    Marland Kitchen lisinopril (PRINIVIL,ZESTRIL) 10 MG tablet Take 1 tablet (10 mg total) by mouth daily. 90 tablet 3  . methylcellulose packet Take 1 each by mouth as needed for constipation. Reported on 11/05/2015    . omeprazole (PRILOSEC) 40 MG capsule Take 1 capsule (40 mg total) by mouth daily. 90 capsule 3  . rosuvastatin (CRESTOR) 10 MG tablet Take 1 tablet (10 mg total) by mouth daily. 90 tablet 3  . venlafaxine XR (EFFEXOR-XR) 37.5 MG 24 hr capsule Take 1 capsule (37.5 mg total) by mouth daily with breakfast. 90 capsule 3   No current facility-administered medications for this visit.    Facility-Administered Medications Ordered in Other Visits  Medication Dose Route Frequency Provider Last Rate Last Dose  . iron sucrose (VENOFER) 200 mg IVPB  200 mg Intravenous Once Charlaine Dalton R, MD 120 mL/hr at 04/26/17 1338 200 mg at 04/26/17 1338      .  PHYSICAL EXAMINATION: ECOG PERFORMANCE STATUS: 0 - Asymptomatic  Vitals:   04/26/17 1158  BP: (!) 169/80  Pulse: 61  Resp: 16  Temp: 98.2 F (36.8 C)    Filed Weights   04/26/17 1158  Weight: 154 lb (69.9 kg)    GENERAL: Well-nourished well-developed; Alert, no distress and comfortable.  He is accompanied by his wife. EYES: no pallor or icterus OROPHARYNX: no thrush or ulceration; good dentition  NECK: supple, no masses felt LYMPH:  no palpable lymphadenopathy in the cervical, axillary or inguinal regions LUNGS: clear to auscultation and  No wheeze or crackles HEART/CVS: regular rate & rhythm and no murmurs; No lower extremity edema ABDOMEN: abdomen soft, non-tender and normal bowel sounds Musculoskeletal:no cyanosis of digits and no clubbing  PSYCH: alert & oriented x 3 with fluent speech NEURO: no focal motor/sensory  deficits SKIN:  no rashes or significant lesions  Results for ABDULKARIM, EBERLIN (MRN [id number]) as of 03/01/2017 15:03  Ref. Range 12/03/2015 09:45 03/03/2016 09:35 06/02/2016 09:26 08/29/2016 09:45 02/27/2017 14:21  PSA Latest Ref Range: 0.00 - 4.00 ng/mL 2.40 0.77 0.60 0.58   Prostatic Specific Antigen Latest Ref Range: 0.00 - 4.00 ng/mL     0.97    LABORATORY DATA:  I have reviewed the data as listed Lab Results  Component Value Date   WBC 7.0 04/26/2017   HGB 7.8 (L) 04/26/2017   HCT 25.4 (L) 04/26/2017   MCV 79.0 (L) 04/26/2017   PLT 393 04/26/2017   Recent Labs    06/02/16 0926 08/29/16 0945 11/28/16 1335 02/27/17 1427  NA 133* 135 137 138  K 4.4 3.7 4.3 3.7  CL 103 101 103 102  CO2 26 25 26 27   GLUCOSE 158* 149* 97 164*  BUN 42* 34* 25* 29*  CREATININE 1.04 1.06 0.95 1.07  CALCIUM 9.4 9.4 9.6 9.4  GFRNONAA >60 >60 >60 >60  GFRAA >60 >60 >60 >60  PROT 7.5 7.6  --  8.2*  ALBUMIN 4.2 3.7  --  3.2*  AST 30 20  --  16  ALT 23 14*  --  13*  ALKPHOS 55 61  --  62  BILITOT 0.6 0.6  --  0.3    RADIOGRAPHIC STUDIES: I have personally reviewed the radiological images as listed and agreed with the findings in the report. No results found.  ASSESSMENT & PLAN:   Normocytic anemia # Anemia- Hb 7.8  worsening/symptomatic;oct 2018- BMBx- mildly hypercellular ? Low grade MDS; MDS FISH-panel normal.  N-b12/folic acid/copper zinc levels.  #No significant improvement noted on IV iron-proceed with treatment #4 today.  Hemoglobin 7.8.  Percent with PRBC transfusion 1-2 days.  #I discussed my continued concerns for low-grade myelodysplastic syndrome which is not clearly evident on the recent bone marrow biopsy.   Also discussed possibility for repeat bone marrow biopsy-this time with extended NGS panel.   # Metastatic prostate cancer Hormone sensitive- biochemical recurrence [M0]- given the rapid doubling time of PSA patient on- ADT every 3 months. PSA  October 2018- 0.9 Tolerating ADT well.  Plan Lupron in January 2019.  # order rectic/ldh/hapto tomorrow; plan transfusion; follow up with me in 4 weeks/hold tube/ cbc/ possible.  Also add Lupron/PSA at next visit.     Cammie Sickle, MD 04/26/2017 1:39 PM

## 2017-04-28 ENCOUNTER — Inpatient Hospital Stay: Payer: Medicare PPO

## 2017-04-28 DIAGNOSIS — I10 Essential (primary) hypertension: Secondary | ICD-10-CM | POA: Diagnosis not present

## 2017-04-28 DIAGNOSIS — Z79818 Long term (current) use of other agents affecting estrogen receptors and estrogen levels: Secondary | ICD-10-CM | POA: Diagnosis not present

## 2017-04-28 DIAGNOSIS — Z923 Personal history of irradiation: Secondary | ICD-10-CM | POA: Diagnosis not present

## 2017-04-28 DIAGNOSIS — C61 Malignant neoplasm of prostate: Secondary | ICD-10-CM | POA: Diagnosis not present

## 2017-04-28 DIAGNOSIS — E785 Hyperlipidemia, unspecified: Secondary | ICD-10-CM | POA: Diagnosis not present

## 2017-04-28 DIAGNOSIS — H409 Unspecified glaucoma: Secondary | ICD-10-CM | POA: Diagnosis not present

## 2017-04-28 DIAGNOSIS — K589 Irritable bowel syndrome without diarrhea: Secondary | ICD-10-CM | POA: Diagnosis not present

## 2017-04-28 DIAGNOSIS — D649 Anemia, unspecified: Secondary | ICD-10-CM

## 2017-04-28 DIAGNOSIS — K219 Gastro-esophageal reflux disease without esophagitis: Secondary | ICD-10-CM | POA: Diagnosis not present

## 2017-04-28 LAB — LACTATE DEHYDROGENASE: LDH: 123 U/L (ref 98–192)

## 2017-04-28 LAB — RETICULOCYTES
RBC.: 3.49 MIL/uL — ABNORMAL LOW (ref 4.40–5.90)
Retic Count, Absolute: 62.8 10*3/uL (ref 19.0–183.0)
Retic Ct Pct: 1.8 % (ref 0.4–3.1)

## 2017-04-28 MED ORDER — SODIUM CHLORIDE 0.9% FLUSH
10.0000 mL | INTRAVENOUS | Status: DC | PRN
  Filled 2017-04-28: qty 10

## 2017-04-28 MED ORDER — ACETAMINOPHEN 325 MG PO TABS
650.0000 mg | ORAL_TABLET | Freq: Once | ORAL | Status: AC
  Administered 2017-04-28: 650 mg via ORAL
  Filled 2017-04-28: qty 2

## 2017-04-28 MED ORDER — DIPHENHYDRAMINE HCL 25 MG PO CAPS
25.0000 mg | ORAL_CAPSULE | Freq: Once | ORAL | Status: AC
  Administered 2017-04-28: 25 mg via ORAL
  Filled 2017-04-28: qty 1

## 2017-04-28 MED ORDER — SODIUM CHLORIDE 0.9 % IV SOLN
250.0000 mL | Freq: Once | INTRAVENOUS | Status: AC
  Administered 2017-04-28: 250 mL via INTRAVENOUS
  Filled 2017-04-28: qty 250

## 2017-04-28 NOTE — Progress Notes (Signed)
Pt with elevated BP in office today.  Dr. Rogue Bussing notified.  Pt to get a home BP cuff and monitor his BP at home.  Pt also given data from his last few visits of his elevated BP to call his PCP.  Pt aware of plan, all questions answered.

## 2017-04-29 LAB — BPAM RBC
Blood Product Expiration Date: 201812192359
ISSUE DATE / TIME: 201812070927
UNIT TYPE AND RH: 6200

## 2017-04-29 LAB — TYPE AND SCREEN
ABO/RH(D): A POS
ANTIBODY SCREEN: NEGATIVE
UNIT DIVISION: 0

## 2017-04-29 LAB — HAPTOGLOBIN: Haptoglobin: 395 mg/dL — ABNORMAL HIGH (ref 34–200)

## 2017-05-23 HISTORY — PX: CATARACT EXTRACTION: SUR2

## 2017-05-24 ENCOUNTER — Encounter: Payer: Self-pay | Admitting: Internal Medicine

## 2017-05-24 ENCOUNTER — Other Ambulatory Visit: Payer: Self-pay

## 2017-05-24 ENCOUNTER — Inpatient Hospital Stay: Payer: Medicare PPO

## 2017-05-24 ENCOUNTER — Inpatient Hospital Stay (HOSPITAL_BASED_OUTPATIENT_CLINIC_OR_DEPARTMENT_OTHER): Payer: Medicare PPO | Admitting: Internal Medicine

## 2017-05-24 ENCOUNTER — Inpatient Hospital Stay: Payer: Medicare PPO | Attending: Internal Medicine

## 2017-05-24 VITALS — BP 190/70 | HR 58 | Temp 97.0°F | Resp 18 | Ht 68.0 in | Wt 157.8 lb

## 2017-05-24 DIAGNOSIS — Z87891 Personal history of nicotine dependence: Secondary | ICD-10-CM

## 2017-05-24 DIAGNOSIS — Z7982 Long term (current) use of aspirin: Secondary | ICD-10-CM | POA: Insufficient documentation

## 2017-05-24 DIAGNOSIS — Z800 Family history of malignant neoplasm of digestive organs: Secondary | ICD-10-CM

## 2017-05-24 DIAGNOSIS — I10 Essential (primary) hypertension: Secondary | ICD-10-CM

## 2017-05-24 DIAGNOSIS — C61 Malignant neoplasm of prostate: Secondary | ICD-10-CM | POA: Insufficient documentation

## 2017-05-24 DIAGNOSIS — R232 Flushing: Secondary | ICD-10-CM | POA: Insufficient documentation

## 2017-05-24 DIAGNOSIS — H409 Unspecified glaucoma: Secondary | ICD-10-CM

## 2017-05-24 DIAGNOSIS — K589 Irritable bowel syndrome without diarrhea: Secondary | ICD-10-CM | POA: Insufficient documentation

## 2017-05-24 DIAGNOSIS — Z923 Personal history of irradiation: Secondary | ICD-10-CM

## 2017-05-24 DIAGNOSIS — K219 Gastro-esophageal reflux disease without esophagitis: Secondary | ICD-10-CM

## 2017-05-24 DIAGNOSIS — D649 Anemia, unspecified: Secondary | ICD-10-CM | POA: Insufficient documentation

## 2017-05-24 DIAGNOSIS — Z79818 Long term (current) use of other agents affecting estrogen receptors and estrogen levels: Secondary | ICD-10-CM | POA: Diagnosis not present

## 2017-05-24 DIAGNOSIS — Z79899 Other long term (current) drug therapy: Secondary | ICD-10-CM | POA: Insufficient documentation

## 2017-05-24 DIAGNOSIS — Z86010 Personal history of colonic polyps: Secondary | ICD-10-CM | POA: Insufficient documentation

## 2017-05-24 DIAGNOSIS — E785 Hyperlipidemia, unspecified: Secondary | ICD-10-CM | POA: Diagnosis not present

## 2017-05-24 LAB — SAMPLE TO BLOOD BANK

## 2017-05-24 LAB — CBC WITH DIFFERENTIAL/PLATELET
BASOS PCT: 1 %
Basophils Absolute: 0 10*3/uL (ref 0–0.1)
Eosinophils Absolute: 0.2 10*3/uL (ref 0–0.7)
Eosinophils Relative: 2 %
HCT: 28.3 % — ABNORMAL LOW (ref 40.0–52.0)
Hemoglobin: 8.9 g/dL — ABNORMAL LOW (ref 13.0–18.0)
LYMPHS ABS: 0.9 10*3/uL — AB (ref 1.0–3.6)
Lymphocytes Relative: 12 %
MCH: 25.1 pg — AB (ref 26.0–34.0)
MCHC: 31.4 g/dL — AB (ref 32.0–36.0)
MCV: 79.9 fL — ABNORMAL LOW (ref 80.0–100.0)
MONO ABS: 0.5 10*3/uL (ref 0.2–1.0)
MONOS PCT: 6 %
NEUTROS ABS: 6 10*3/uL (ref 1.4–6.5)
Neutrophils Relative %: 79 %
Platelets: 342 10*3/uL (ref 150–440)
RBC: 3.54 MIL/uL — ABNORMAL LOW (ref 4.40–5.90)
RDW: 21.4 % — AB (ref 11.5–14.5)
WBC: 7.6 10*3/uL (ref 3.8–10.6)

## 2017-05-24 LAB — PSA: PROSTATIC SPECIFIC ANTIGEN: 0.89 ng/mL (ref 0.00–4.00)

## 2017-05-24 MED ORDER — LEUPROLIDE ACETATE (3 MONTH) 22.5 MG IM KIT
22.5000 mg | PACK | Freq: Once | INTRAMUSCULAR | Status: AC
  Administered 2017-05-24: 22.5 mg via INTRAMUSCULAR
  Filled 2017-05-24: qty 22.5

## 2017-05-24 NOTE — Progress Notes (Signed)
Patient missed his am dose of lisinopril. BP elevated this morning.

## 2017-05-24 NOTE — Progress Notes (Signed)
[place] NOTE  Patient Care Team: [name], MD as PCP - General (Family Medicine) Bond, Tracie Harrier, MD as Consulting Physician (Ophthalmology)  CHIEF COMPLAINTS/PURPOSE OF CONSULTATION:  Oncology History   # external beam radiation therapy and radiation seed implant in 2005 with Dr. Joelyn Oms and Dr. Danny Lawless for T1c Gleason 3+4 = 7 prostate cancer with a pretreatment PSA of 6.8. In November 2015, he was found to have biochemical recurrence.  Dec 2016- CT a/p & Bone scan- NEG; PSA- 11; June 2017- 19; STARTED on Firmagon [Dr.Budzyn]; on Lupron q 81M  # OCT 2018- BMBx- hypercellular 30%; relative myeloid hyperplasia/erythroid hypoplasia; FISH- 5/7/8-Normal.  # JAN 2018- Dexa scan- N     Primary adenocarcinoma of prostate ([place])     HISTORY OF PRESENTING ILLNESS:  Thomas Lopez 84 y.o.  male very pleasant patient with anemia unclear etiology-currently on IV iron; also history of prostate cancer/with biochemical recurrence on ADT is here for follow-up.  Patient received her last blood transfusion approximately 4 weeks ago.  He noted to have improvement of his energy levels.  Denies any bone pain. Has hot flashes which are stable. No significant weight loss no new constipation.   ROS: A complete 10 point review of system is done which is negative except mentioned above in history of present illness  MEDICAL HISTORY:  Past Medical History:  Diagnosis Date  . Aorto-iliac atherosclerosis ([place]) 04/2015   by CT scan  . Carotid stenosis 11/2014   mild bilateral 1-39%, rpt 2 yrs  . Colon polyps    rpt colonoscopy due 2014  . ED (erectile dysfunction)    Trimix and VED  . Esophagitis   . Essential hypertension 06/29/2009  . GERD (gastroesophageal reflux disease)   . Glaucoma    severe ([place])  . HLD (hyperlipidemia)   . HTN (hypertension)   . Hyperglycemia   . IBS (irritable bowel syndrome)   . Prostate cancer [facility]) 2005   s/p seed implant, EBRT  (Dr. Alinda Money at [facility]) recurrent treating with androgen deprivation referred to cancer center  . Smoking history     SURGICAL HISTORY: Past Surgical History:  Procedure Laterality Date  . CATARACT EXTRACTION, BILATERAL  December 2016   [place], Dr. Raynelle Fanning  . COLONOSCOPY  03/01/01   Multiple polyps, divertic//adenomatous polyps  . COLONOSCOPY  04/24/01   multiple polyps  . COLONOSCOPY  10/23/01   Polyps benign-repeat every 2 years  . COLONOSCOPY  06/23/04   Adenom/hyperplastic polyps; multiple external hemorrhoids  . COLONOSCOPY  06/08/07   single small polyp-repeat 5 years  . ESOPHAGOGASTRODUODENOSCOPY  03/01/01   H.H.; gastritis esoph. dudodenitis  . NCS/Wrists Left Carpal  2/02   Min./right improved  . PFTs  10/2012   FVC 64%, FEV1 41%, ratio 0.62  . PROSTATE BIOPSY  12/04   positive; radioactive seed implant (Dr. Joelyn Oms)  . Prostate Ext Beam Radiation  1/27-07/23/03   Dr. Danny Lawless  . SPIROMETRY  09/2011   WNL  . Wrist Release  6/98; 9/99   Right    SOCIAL HISTORY: Social History   Socioeconomic History  . Marital status: Legally Separated    Spouse name: Not on file  . Number of children: 5  . Years of education: Not on file  . Highest education level: Not on file  Social Needs  . Financial resource strain: Not on file  . Food insecurity - worry: Not on file  . Food insecurity - inability: Not  on file  . Transportation needs - medical: Not on file  . Transportation needs - non-medical: Not on file  Occupational History  . Occupation: Retired  Tobacco Use  . Smoking status: Former Smoker    Last attempt to quit: 05/23/1993    Years since quitting: 24.0  . Smokeless tobacco: Never Used  Substance and Sexual Activity  . Alcohol use: Yes    Comment: Rare  . Drug use: No  . Sexual activity: Yes  Other Topics Concern  . 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 Cancer Mother        colon  . Hyperlipidemia Mother   . Other Mother        Carotid disease  . Coronary artery disease Neg Hx   . Stroke Neg Hx     ALLERGIES:  has No Known Allergies.  MEDICATIONS:  Current Outpatient Medications  Medication Sig Dispense Refill  . aspirin EC 81 MG tablet Take 81 mg by mouth daily.    Marland Kitchen leuprolide (LUPRON) 22.5 MG injection Inject 22.5 mg into the muscle every 3 (three) months.    Marland Kitchen lisinopril (PRINIVIL,ZESTRIL) 10 MG tablet Take 1 tablet (10 mg total) by mouth daily. 90 tablet 3  . methylcellulose packet Take 1 each by mouth as needed for constipation. Reported on 11/05/2015    . omeprazole (PRILOSEC) 40 MG capsule Take 1 capsule (40 mg total) by mouth daily. 90 capsule 3  . rosuvastatin (CRESTOR) 10 MG tablet Take 1 tablet (10 mg total) by mouth daily. 90 tablet 3  . timolol (TIMOPTIC) 0.5 % ophthalmic solution     . venlafaxine XR (EFFEXOR-XR) 37.5 MG 24 hr capsule Take 1 capsule (37.5 mg total) by mouth daily with breakfast. 90 capsule 3   No current facility-administered medications for this visit.       Marland Kitchen  PHYSICAL EXAMINATION: ECOG PERFORMANCE STATUS: 0 - Asymptomatic  Vitals:   05/24/17 1004 05/24/17 1010  BP: (!) 198/86 (!) 190/70  Pulse: (!) 58   Resp:    Temp:     Filed Weights   05/24/17 1003  Weight: 157 lb 12.8 oz (71.6 kg)    GENERAL: Well-nourished well-developed; Alert, no distress and comfortable.  He is accompanied by his wife. EYES: no pallor or icterus OROPHARYNX: no thrush or ulceration; good dentition  NECK: supple, no masses felt LYMPH:  no palpable lymphadenopathy in the cervical, axillary or inguinal regions LUNGS: clear to auscultation and  No wheeze or crackles HEART/CVS: regular rate & rhythm and no murmurs; No lower extremity  edema ABDOMEN: abdomen soft, non-tender and normal bowel sounds Musculoskeletal:no cyanosis of digits and no clubbing  PSYCH: alert & oriented x 3 with fluent speech NEURO: no focal motor/sensory deficits SKIN:  no rashes or significant lesions  Results for Thomas Lopez, Thomas Lopez (MRN [id number]) as of 03/01/2017 15:03  Ref. Range 12/03/2015 09:45 03/03/2016 09:35 06/02/2016 09:26 08/29/2016 09:45 02/27/2017 14:21  PSA Latest Ref Range: 0.00 - 4.00 ng/mL 2.40 0.77 0.60 0.58   Prostatic Specific Antigen Latest Ref Range: 0.00 - 4.00 ng/mL     0.97    LABORATORY DATA:  I have reviewed the data as listed Lab Results  Component Value Date  WBC 7.6 05/24/2017   HGB 8.9 (L) 05/24/2017   HCT 28.3 (L) 05/24/2017   MCV 79.9 (L) 05/24/2017   PLT 342 05/24/2017   Recent Labs    06/02/16 0926 08/29/16 0945 11/28/16 1335 02/27/17 1427  NA 133* 135 137 138  K 4.4 3.7 4.3 3.7  CL 103 101 103 102  CO2 _0 GLUCOSE 158* 149* 97 164*  BUN 42* 34* 25* 29*  CREATININE 1.04 1.06 0.95 1.07  CALCIUM 9.4 9.4 9.6 9.4  GFRNONAA >60 >60 >60 >60  GFRAA >60 >60 >60 >60  PROT 7.5 7.6  --  8.2*  ALBUMIN 4.2 3.7  --  3.2*  AST 30 20  --  16  ALT 23 14*  --  13*  ALKPHOS 55 61  --  62  BILITOT 0.6 0.6  --  0.3    RADIOGRAPHIC STUDIES: I have personally reviewed the radiological images as listed and agreed with the findings in the report. No results found.  ASSESSMENT & PLAN:   Normocytic anemia # Anemia- Hb 7.8 worsening/symptomatic;OCT 2018- BMBx- mildly hypercellular ? Low grade MDS; MDS FISH-panel normal.  N-haptoglobin;b12/folic acid/copper zinc levels.  # I discussed my continued concerns for low-grade myelodysplastic syndrome which is not clearly evident on the recent bone marrow biopsy.  Also discussed possibility for repeat bone marrow biopsy-this time with extended NGS panel.  Will reevaluate the hemoglobin the next 1 month or so.  If worsening would recommend a repeat bone marrow biopsy  with NGS panel.  # Metastatic prostate cancer Hormone sensitive- biochemical recurrence [M0]- given the rapid doubling time of PSA patient on- ADT every 3 months. PSA  October 2018- 0.9 Tolerating ADT well. lupron today.   # elevated blood pressure- 130s at home; ? whote coat HNT.   # follow up in 4 weeks/ MD;labs- hold tube also in 2weeks.      Cammie Sickle, MD 05/24/2017 4:09 PM

## 2017-05-24 NOTE — Assessment & Plan Note (Addendum)
#  Anemia- Hb 7.8 worsening/symptomatic;OCT 2018- BMBx- mildly hypercellular ? Low grade MDS; MDS FISH-panel normal.  N-haptoglobin;b12/folic acid/copper zinc levels.  # I discussed my continued concerns for low-grade myelodysplastic syndrome which is not clearly evident on the recent bone marrow biopsy.  Also discussed possibility for repeat bone marrow biopsy-this time with extended NGS panel.  Will reevaluate the hemoglobin the next 1 month or so.  If worsening would recommend a repeat bone marrow biopsy with NGS panel.  # Metastatic prostate cancer Hormone sensitive- biochemical recurrence [M0]- given the rapid doubling time of PSA patient on- ADT every 3 months. PSA  October 2018- 0.9 Tolerating ADT well. lupron today.   # elevated blood pressure- 130s at home; ? whote coat HNT.   # follow up in 4 weeks/ MD;labs- hold tube also in 2weeks.

## 2017-05-25 LAB — ERYTHROPOIETIN: Erythropoietin: 47.8 m[IU]/mL — ABNORMAL HIGH (ref 2.6–18.5)

## 2017-06-07 ENCOUNTER — Inpatient Hospital Stay: Payer: Medicare PPO

## 2017-06-07 DIAGNOSIS — R232 Flushing: Secondary | ICD-10-CM | POA: Diagnosis not present

## 2017-06-07 DIAGNOSIS — C61 Malignant neoplasm of prostate: Secondary | ICD-10-CM | POA: Diagnosis not present

## 2017-06-07 DIAGNOSIS — E785 Hyperlipidemia, unspecified: Secondary | ICD-10-CM | POA: Diagnosis not present

## 2017-06-07 DIAGNOSIS — I10 Essential (primary) hypertension: Secondary | ICD-10-CM | POA: Diagnosis not present

## 2017-06-07 DIAGNOSIS — Z923 Personal history of irradiation: Secondary | ICD-10-CM | POA: Diagnosis not present

## 2017-06-07 DIAGNOSIS — K219 Gastro-esophageal reflux disease without esophagitis: Secondary | ICD-10-CM | POA: Diagnosis not present

## 2017-06-07 DIAGNOSIS — D649 Anemia, unspecified: Secondary | ICD-10-CM

## 2017-06-07 DIAGNOSIS — Z79818 Long term (current) use of other agents affecting estrogen receptors and estrogen levels: Secondary | ICD-10-CM | POA: Diagnosis not present

## 2017-06-07 DIAGNOSIS — K589 Irritable bowel syndrome without diarrhea: Secondary | ICD-10-CM | POA: Diagnosis not present

## 2017-06-07 LAB — CBC WITH DIFFERENTIAL/PLATELET
BASOS PCT: 0 %
Basophils Absolute: 0 10*3/uL (ref 0–0.1)
EOS ABS: 0.1 10*3/uL (ref 0–0.7)
EOS PCT: 1 %
HCT: 29.3 % — ABNORMAL LOW (ref 40.0–52.0)
Hemoglobin: 9.1 g/dL — ABNORMAL LOW (ref 13.0–18.0)
Lymphocytes Relative: 11 %
Lymphs Abs: 0.9 10*3/uL — ABNORMAL LOW (ref 1.0–3.6)
MCH: 24.5 pg — AB (ref 26.0–34.0)
MCHC: 31 g/dL — AB (ref 32.0–36.0)
MCV: 79 fL — ABNORMAL LOW (ref 80.0–100.0)
MONOS PCT: 6 %
Monocytes Absolute: 0.5 10*3/uL (ref 0.2–1.0)
NEUTROS PCT: 82 %
Neutro Abs: 6.4 10*3/uL (ref 1.4–6.5)
PLATELETS: 444 10*3/uL — AB (ref 150–440)
RBC: 3.72 MIL/uL — AB (ref 4.40–5.90)
RDW: 20.5 % — ABNORMAL HIGH (ref 11.5–14.5)
WBC: 7.9 10*3/uL (ref 3.8–10.6)

## 2017-06-07 LAB — SAMPLE TO BLOOD BANK

## 2017-06-14 DIAGNOSIS — H401133 Primary open-angle glaucoma, bilateral, severe stage: Secondary | ICD-10-CM | POA: Diagnosis not present

## 2017-06-17 DIAGNOSIS — I10 Essential (primary) hypertension: Secondary | ICD-10-CM | POA: Diagnosis not present

## 2017-06-17 DIAGNOSIS — H547 Unspecified visual loss: Secondary | ICD-10-CM | POA: Diagnosis not present

## 2017-06-17 DIAGNOSIS — C61 Malignant neoplasm of prostate: Secondary | ICD-10-CM | POA: Diagnosis not present

## 2017-06-17 DIAGNOSIS — K219 Gastro-esophageal reflux disease without esophagitis: Secondary | ICD-10-CM | POA: Diagnosis not present

## 2017-06-17 DIAGNOSIS — F3340 Major depressive disorder, recurrent, in remission, unspecified: Secondary | ICD-10-CM | POA: Diagnosis not present

## 2017-06-17 DIAGNOSIS — H409 Unspecified glaucoma: Secondary | ICD-10-CM | POA: Diagnosis not present

## 2017-06-17 DIAGNOSIS — Z809 Family history of malignant neoplasm, unspecified: Secondary | ICD-10-CM | POA: Diagnosis not present

## 2017-06-17 DIAGNOSIS — Z6822 Body mass index (BMI) 22.0-22.9, adult: Secondary | ICD-10-CM | POA: Diagnosis not present

## 2017-06-17 DIAGNOSIS — E785 Hyperlipidemia, unspecified: Secondary | ICD-10-CM | POA: Diagnosis not present

## 2017-06-21 ENCOUNTER — Inpatient Hospital Stay: Payer: Medicare PPO

## 2017-06-21 ENCOUNTER — Inpatient Hospital Stay (HOSPITAL_BASED_OUTPATIENT_CLINIC_OR_DEPARTMENT_OTHER): Payer: Medicare PPO | Admitting: Internal Medicine

## 2017-06-21 ENCOUNTER — Encounter: Payer: Self-pay | Admitting: Internal Medicine

## 2017-06-21 VITALS — BP 190/70 | HR 60 | Temp 97.5°F | Wt 142.4 lb

## 2017-06-21 DIAGNOSIS — Z7982 Long term (current) use of aspirin: Secondary | ICD-10-CM

## 2017-06-21 DIAGNOSIS — Z923 Personal history of irradiation: Secondary | ICD-10-CM | POA: Diagnosis not present

## 2017-06-21 DIAGNOSIS — C61 Malignant neoplasm of prostate: Secondary | ICD-10-CM

## 2017-06-21 DIAGNOSIS — H409 Unspecified glaucoma: Secondary | ICD-10-CM

## 2017-06-21 DIAGNOSIS — Z79899 Other long term (current) drug therapy: Secondary | ICD-10-CM

## 2017-06-21 DIAGNOSIS — I10 Essential (primary) hypertension: Secondary | ICD-10-CM

## 2017-06-21 DIAGNOSIS — D649 Anemia, unspecified: Secondary | ICD-10-CM | POA: Diagnosis not present

## 2017-06-21 DIAGNOSIS — E785 Hyperlipidemia, unspecified: Secondary | ICD-10-CM

## 2017-06-21 DIAGNOSIS — K219 Gastro-esophageal reflux disease without esophagitis: Secondary | ICD-10-CM | POA: Diagnosis not present

## 2017-06-21 DIAGNOSIS — Z800 Family history of malignant neoplasm of digestive organs: Secondary | ICD-10-CM

## 2017-06-21 DIAGNOSIS — Z87891 Personal history of nicotine dependence: Secondary | ICD-10-CM

## 2017-06-21 DIAGNOSIS — Z79818 Long term (current) use of other agents affecting estrogen receptors and estrogen levels: Secondary | ICD-10-CM

## 2017-06-21 DIAGNOSIS — K589 Irritable bowel syndrome without diarrhea: Secondary | ICD-10-CM

## 2017-06-21 DIAGNOSIS — R232 Flushing: Secondary | ICD-10-CM | POA: Diagnosis not present

## 2017-06-21 DIAGNOSIS — Z86010 Personal history of colonic polyps: Secondary | ICD-10-CM

## 2017-06-21 LAB — CBC WITH DIFFERENTIAL/PLATELET
BASOS ABS: 0.1 10*3/uL (ref 0–0.1)
Basophils Relative: 1 %
EOS PCT: 1 %
Eosinophils Absolute: 0.1 10*3/uL (ref 0–0.7)
HCT: 27.5 % — ABNORMAL LOW (ref 40.0–52.0)
HEMOGLOBIN: 8.7 g/dL — AB (ref 13.0–18.0)
LYMPHS ABS: 1.1 10*3/uL (ref 1.0–3.6)
LYMPHS PCT: 12 %
MCH: 24.8 pg — AB (ref 26.0–34.0)
MCHC: 31.6 g/dL — ABNORMAL LOW (ref 32.0–36.0)
MCV: 78.5 fL — AB (ref 80.0–100.0)
Monocytes Absolute: 0.6 10*3/uL (ref 0.2–1.0)
Monocytes Relative: 7 %
NEUTROS ABS: 7.3 10*3/uL — AB (ref 1.4–6.5)
NEUTROS PCT: 79 %
Platelets: 438 10*3/uL (ref 150–440)
RBC: 3.5 MIL/uL — AB (ref 4.40–5.90)
RDW: 20.4 % — ABNORMAL HIGH (ref 11.5–14.5)
WBC: 9.1 10*3/uL (ref 3.8–10.6)

## 2017-06-21 LAB — SAMPLE TO BLOOD BANK

## 2017-06-21 NOTE — Progress Notes (Signed)
Patient b/p ( left arm) 190/70 with machine. Patient was concern about his blood pressure being high. Recheck manual (right arm) 130/79

## 2017-06-21 NOTE — Assessment & Plan Note (Addendum)
#   Anemia- nadir Hb 7.8 worsening/symptomatic; unclear etiology.  OCT 2018- BMBx- mildly hypercellular ? Low grade MDS; MDS FISH-panel normal.  Awaiting a second opinion on the bone. Today hemoglobin 8.7 hold PRBC transfusion.  # Metastatic prostate cancer Hormone sensitive- biochemical recurrence [M0]- Jan 2019- 0.9. Lupron 05/24/2017.   # Elevated blood pressure- rechecked -130/74; 130s at home; ? white coat HNT.   # follow up in 6 weeks/ MD;labs- hold tube also in 3 weeks.

## 2017-06-21 NOTE — Progress Notes (Signed)
[place] NOTE  Patient Care Team: [name], MD as PCP - General (Family Medicine) Bond, Tracie Harrier, MD as Consulting Physician (Ophthalmology)  CHIEF COMPLAINTS/PURPOSE OF CONSULTATION:  Oncology History   # external beam radiation therapy and radiation seed implant in 2005 with Dr. Joelyn Oms and Dr. Danny Lawless for T1c Gleason 3+4 = 7 prostate cancer with a pretreatment PSA of 6.8. In November 2015, he was found to have biochemical recurrence.  Dec 2016- CT a/p & Bone scan- NEG; PSA- 11; June 2017- 19; STARTED on Firmagon [Dr.Budzyn]; on Lupron q 81M  # OCT 2018- BMBx- hypercellular 30%; relative myeloid hyperplasia/erythroid hypoplasia; FISH- 5/7/8-Normal.  # JAN 2018- Dexa scan- N     Primary adenocarcinoma of prostate ([place])     HISTORY OF PRESENTING ILLNESS:  Thomas Lopez July 84 y.o.  male very pleasant patient with anemia unclear etiology-currently on IV iron; also history of prostate cancer/with biochemical recurrence on ADT is here for follow-up.  Patient received her last blood transfusion approximately 4 weeks ago.  He noted to have improvement of his energy levels.  Denies any bone pain. Has hot flashes which are stable. No significant weight loss no new constipation.   ROS: A complete 10 point review of system is done which is negative except mentioned above in history of present illness  MEDICAL HISTORY:  Past Medical History:  Diagnosis Date  . Aorto-iliac atherosclerosis ([place]) 04/2015   by CT scan  . Carotid stenosis 11/2014   mild bilateral 1-39%, rpt 2 yrs  . Colon polyps    rpt colonoscopy due 2014  . ED (erectile dysfunction)    Trimix and VED  . Esophagitis   . Essential hypertension 06/29/2009  . GERD (gastroesophageal reflux disease)   . Glaucoma    severe ([place])  . HLD (hyperlipidemia)   . HTN (hypertension)   . Hyperglycemia   . IBS (irritable bowel syndrome)   . Prostate cancer [facility]) 2005   s/p seed implant, EBRT  (Dr. Alinda Money at [facility]) recurrent treating with androgen deprivation referred to cancer center  . Smoking history     SURGICAL HISTORY: Past Surgical History:  Procedure Laterality Date  . CATARACT EXTRACTION, BILATERAL  December 2016   [place], Dr. Raynelle Fanning  . COLONOSCOPY  03/01/01   Multiple polyps, divertic//adenomatous polyps  . COLONOSCOPY  04/24/01   multiple polyps  . COLONOSCOPY  10/23/01   Polyps benign-repeat every 2 years  . COLONOSCOPY  06/23/04   Adenom/hyperplastic polyps; multiple external hemorrhoids  . COLONOSCOPY  06/08/07   single small polyp-repeat 5 years  . ESOPHAGOGASTRODUODENOSCOPY  03/01/01   H.H.; gastritis esoph. dudodenitis  . NCS/Wrists Left Carpal  2/02   Min./right improved  . PFTs  10/2012   FVC 64%, FEV1 41%, ratio 0.62  . PROSTATE BIOPSY  12/04   positive; radioactive seed implant (Dr. Joelyn Oms)  . Prostate Ext Beam Radiation  1/27-07/23/03   Dr. Danny Lawless  . SPIROMETRY  09/2011   WNL  . Wrist Release  6/98; 9/99   Right    SOCIAL HISTORY: Social History   Socioeconomic History  . Marital status: Legally Separated    Spouse name: Not on file  . Number of children: 5  . Years of education: Not on file  . Highest education level: Not on file  Social Needs  . Financial resource strain: Not on file  . Food insecurity - worry: Not on file  . Food insecurity - inability: Not  on file  . Transportation needs - medical: Not on file  . Transportation needs - non-medical: Not on file  Occupational History  . Occupation: Retired  Tobacco Use  . Smoking status: Former Smoker    Last attempt to quit: 05/23/1993    Years since quitting: 24.0  . Smokeless tobacco: Never Used  Substance and Sexual Activity  . Alcohol use: Yes    Comment: Rare  . Drug use: No  . Sexual activity: Yes  Other Topics Concern  . 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 Cancer Mother        colon  . Hyperlipidemia Mother   . Other Mother        Carotid disease  . Coronary artery disease Neg Hx   . Stroke Neg Hx     ALLERGIES:  has No Known Allergies.  MEDICATIONS:  Current Outpatient Medications  Medication Sig Dispense Refill  . aspirin EC 81 MG tablet Take 81 mg by mouth daily.    Marland Kitchen leuprolide (LUPRON) 22.5 MG injection Inject 22.5 mg into the muscle every 3 (three) months.    Marland Kitchen lisinopril (PRINIVIL,ZESTRIL) 10 MG tablet Take 1 tablet (10 mg total) by mouth daily. 90 tablet 3  . methylcellulose packet Take 1 each by mouth as needed for constipation. Reported on 11/05/2015    . omeprazole (PRILOSEC) 40 MG capsule Take 1 capsule (40 mg total) by mouth daily. 90 capsule 3  . rosuvastatin (CRESTOR) 10 MG tablet Take 1 tablet (10 mg total) by mouth daily. 90 tablet 3  . timolol (TIMOPTIC) 0.5 % ophthalmic solution     . venlafaxine XR (EFFEXOR-XR) 37.5 MG 24 hr capsule Take 1 capsule (37.5 mg total) by mouth daily with breakfast. 90 capsule 3   No current facility-administered medications for this visit.       Marland Kitchen  PHYSICAL EXAMINATION: ECOG PERFORMANCE STATUS: 0 - Asymptomatic  Vitals:   06/21/17 0948  BP: (!) 190/70  Pulse: 60  Temp: (!) 97.5 F (36.4 C)   Filed Weights   06/21/17 0948  Weight: 142 lb 6.4 oz (64.6 kg)    GENERAL: Well-nourished well-developed; Alert, no distress and comfortable.  He is alone. EYES: no pallor or icterus OROPHARYNX: no thrush or ulceration; good dentition  NECK: supple, no masses felt LYMPH:  no palpable lymphadenopathy in the cervical, axillary or inguinal regions LUNGS: clear to auscultation and  No wheeze or crackles HEART/CVS: regular rate & rhythm and no murmurs; No lower extremity edema ABDOMEN: abdomen soft, non-tender and  normal bowel sounds Musculoskeletal:no cyanosis of digits and no clubbing  PSYCH: alert & oriented x 3 with fluent speech NEURO: no focal motor/sensory deficits SKIN:  no rashes or significant lesions  Results for OLUWATOBILOBA, MARTIN (MRN [id number]) as of 06/21/2017 09:56  Ref. Range 10/25/1995 00:00 01/15/1998 00:00 12/23/1998 00:00 11/02/1999 00:00 12/22/2000 00:00 03/08/2002 00:00 04/11/2003 00:00 04/19/2005 00:00 05/11/2006 00:00 06/18/2008 09:28 06/24/2009 09:15 06/25/2010 08:47 07/08/2011 08:01 08/01/2012 08:20 08/02/2013 08:35 08/06/2014 08:06 03/27/2015 08:44 10/28/2015 08:58 12/03/2015 09:45 03/03/2016 09:35 06/02/2016 09:26 08/29/2016 09:45 02/27/2017 14:21 05/24/2017 09:14  PSA Latest Ref Range: 0.00 - 4.00 ng/mL 5.3 4.4 6.5 5.5 5.8 4.9 6.8 1.22 0.60 0.42 0.56 0.77 1.13  1.54 3.39 5.61 (H)   2.40 0.77 0.60 0.58    Prostate Specific Ag, Serum Latest Ref Range: 0.0 - 4.0 ng/mL                 11.1 (H) 19.4 (H)        Prostatic Specific Antigen Latest Ref Range: 0.00 - 4.00 ng/mL                       0.97 0.89     LABORATORY DATA:  I have reviewed the data as listed Lab Results  Component Value Date   WBC 9.1 06/21/2017   HGB 8.7 (L) 06/21/2017   HCT 27.5 (L) 06/21/2017   MCV 78.5 (L) 06/21/2017   PLT 438 06/21/2017   Recent Labs    08/29/16 0945 11/28/16 1335 02/27/17 1427  NA 135 137 138  K 3.7 4.3 3.7  CL 101 103 102  CO2 25 26 27   GLUCOSE 149* 97 164*  BUN 34* 25* 29*  CREATININE 1.06 0.95 1.07  CALCIUM 9.4 9.6 9.4  GFRNONAA >60 >60 >60  GFRAA >60 >60 >60  PROT 7.6  --  8.2*  ALBUMIN 3.7  --  3.2*  AST 20  --  16  ALT 14*  --  13*  ALKPHOS 61  --  62  BILITOT 0.6  --  0.3    RADIOGRAPHIC STUDIES: I have personally reviewed the radiological images as listed and agreed with the findings in the report. No results found.  ASSESSMENT & PLAN:   Normocytic anemia # Anemia- Hb 7.8 worsening/symptomatic;OCT 2018- BMBx- mildly hypercellular ? Low grade MDS; MDS FISH-panel normal.  Awaiting a  second opinion on the bone.  Today hemoglobin 8.7 hold PRBC transfusion.  # Metastatic prostate cancer Hormone sensitive- biochemical recurrence [M0]- Jan 2019- 0.9. Lupron 05/24/2017.   # Elevated blood pressure- rechecked -130/74; 130s at home; ? whote coat HNT.   # follow up in 6 weeks/ MD;labs- hold tube also in 3 weeks.      Thomas Sickle, MD 06/22/2017 7:50 AM

## 2017-06-22 ENCOUNTER — Telehealth: Payer: Self-pay | Admitting: Internal Medicine

## 2017-06-22 NOTE — Telephone Encounter (Signed)
Spoke to [employer]-? MDS- She will order Foundation One- on bone marrow biopsy.

## 2017-07-05 ENCOUNTER — Other Ambulatory Visit: Payer: Self-pay | Admitting: Family Medicine

## 2017-07-12 ENCOUNTER — Other Ambulatory Visit: Payer: Self-pay

## 2017-07-12 ENCOUNTER — Inpatient Hospital Stay: Payer: Medicare PPO

## 2017-07-12 ENCOUNTER — Inpatient Hospital Stay: Payer: Medicare PPO | Attending: Internal Medicine

## 2017-07-12 ENCOUNTER — Inpatient Hospital Stay (HOSPITAL_BASED_OUTPATIENT_CLINIC_OR_DEPARTMENT_OTHER): Payer: Medicare PPO | Admitting: Internal Medicine

## 2017-07-12 VITALS — BP 181/75 | HR 68 | Temp 95.4°F | Resp 18 | Wt 139.0 lb

## 2017-07-12 DIAGNOSIS — D469 Myelodysplastic syndrome, unspecified: Secondary | ICD-10-CM | POA: Diagnosis not present

## 2017-07-12 DIAGNOSIS — R630 Anorexia: Secondary | ICD-10-CM | POA: Diagnosis not present

## 2017-07-12 DIAGNOSIS — Z87891 Personal history of nicotine dependence: Secondary | ICD-10-CM | POA: Insufficient documentation

## 2017-07-12 DIAGNOSIS — E785 Hyperlipidemia, unspecified: Secondary | ICD-10-CM

## 2017-07-12 DIAGNOSIS — C61 Malignant neoplasm of prostate: Secondary | ICD-10-CM | POA: Diagnosis not present

## 2017-07-12 DIAGNOSIS — D649 Anemia, unspecified: Secondary | ICD-10-CM | POA: Diagnosis not present

## 2017-07-12 DIAGNOSIS — R634 Abnormal weight loss: Secondary | ICD-10-CM | POA: Insufficient documentation

## 2017-07-12 DIAGNOSIS — I10 Essential (primary) hypertension: Secondary | ICD-10-CM

## 2017-07-12 DIAGNOSIS — Z79899 Other long term (current) drug therapy: Secondary | ICD-10-CM | POA: Insufficient documentation

## 2017-07-12 DIAGNOSIS — Z923 Personal history of irradiation: Secondary | ICD-10-CM

## 2017-07-12 DIAGNOSIS — K589 Irritable bowel syndrome without diarrhea: Secondary | ICD-10-CM | POA: Insufficient documentation

## 2017-07-12 DIAGNOSIS — Z79818 Long term (current) use of other agents affecting estrogen receptors and estrogen levels: Secondary | ICD-10-CM | POA: Insufficient documentation

## 2017-07-12 DIAGNOSIS — K219 Gastro-esophageal reflux disease without esophagitis: Secondary | ICD-10-CM | POA: Diagnosis not present

## 2017-07-12 DIAGNOSIS — Z7982 Long term (current) use of aspirin: Secondary | ICD-10-CM | POA: Diagnosis not present

## 2017-07-12 LAB — CBC WITH DIFFERENTIAL/PLATELET
Basophils Absolute: 0.1 10*3/uL (ref 0–0.1)
Basophils Relative: 1 %
Eosinophils Absolute: 0.1 10*3/uL (ref 0–0.7)
Eosinophils Relative: 1 %
HEMATOCRIT: 25.3 % — AB (ref 40.0–52.0)
Hemoglobin: 8 g/dL — ABNORMAL LOW (ref 13.0–18.0)
LYMPHS ABS: 1 10*3/uL (ref 1.0–3.6)
LYMPHS PCT: 11 %
MCH: 24.4 pg — AB (ref 26.0–34.0)
MCHC: 31.4 g/dL — AB (ref 32.0–36.0)
MCV: 77.8 fL — AB (ref 80.0–100.0)
MONOS PCT: 8 %
Monocytes Absolute: 0.7 10*3/uL (ref 0.2–1.0)
NEUTROS ABS: 7.2 10*3/uL — AB (ref 1.4–6.5)
Neutrophils Relative %: 79 %
Platelets: 431 10*3/uL (ref 150–440)
RBC: 3.26 MIL/uL — ABNORMAL LOW (ref 4.40–5.90)
RDW: 20.7 % — AB (ref 11.5–14.5)
WBC: 9 10*3/uL (ref 3.8–10.6)

## 2017-07-12 LAB — SAMPLE TO BLOOD BANK

## 2017-07-12 NOTE — Progress Notes (Signed)
[place] NOTE  Patient Care Team: [name], MD as PCP - General (Family Medicine) Bond, Tracie Harrier, MD as Consulting Physician (Ophthalmology)  CHIEF COMPLAINTS/PURPOSE OF CONSULTATION:  Oncology History   # external beam radiation therapy and radiation seed implant in 2005 with Dr. Joelyn Oms and Dr. Danny Lawless for T1c Gleason 3+4 = 7 prostate cancer with a pretreatment PSA of 6.8. In November 2015, he was found to have biochemical recurrence.  Dec 2016- CT a/p & Bone scan- NEG; PSA- 11; June 2017- 19; STARTED on Firmagon [Dr.Budzyn]; on Lupron q 14M  # OCT 2018- BMBx- hypercellular 30%; relative myeloid hyperplasia/erythroid hypoplasia; FISH- 5/7/8-Normal.  # JAN 2018- Dexa scan- N     Primary adenocarcinoma of prostate ([place])     HISTORY OF PRESENTING ILLNESS:  Thomas Lopez 84 y.o.  male very pleasant patient with anemia unclear etiology-currently on IV iron; also history of prostate cancer/with biochemical recurrence on ADT is here for follow-up.  Patient received his last blood transfusion was on December 5.  He denies any significant fatigue.  Complains of poor appetite.  Mild weight loss.  Denies any bone pain. Has hot flashes which are stable. No significant weight loss no new constipation.   ROS: A complete 10 point review of system is done which is negative except mentioned above in history of present illness  MEDICAL HISTORY:  Past Medical History:  Diagnosis Date  . Aorto-iliac atherosclerosis ([place]) 04/2015   by CT scan  . Carotid stenosis 11/2014   mild bilateral 1-39%, rpt 2 yrs  . Colon polyps    rpt colonoscopy due 2014  . ED (erectile dysfunction)    Trimix and VED  . Esophagitis   . Essential hypertension 06/29/2009  . GERD (gastroesophageal reflux disease)   . Glaucoma    severe ([place])  . HLD (hyperlipidemia)   . HTN (hypertension)   . Hyperglycemia   . IBS (irritable bowel syndrome)   . Prostate cancer [facility]) 2005   s/p seed implant, EBRT (Dr. Alinda Money at [facility]) recurrent treating with androgen deprivation referred to cancer center  . Smoking history     SURGICAL HISTORY: Past Surgical History:  Procedure Laterality Date  . CATARACT EXTRACTION, BILATERAL  December 2016   [place], Dr. Raynelle Fanning  . COLONOSCOPY  03/01/01   Multiple polyps, divertic//adenomatous polyps  . COLONOSCOPY  04/24/01   multiple polyps  . COLONOSCOPY  10/23/01   Polyps benign-repeat every 2 years  . COLONOSCOPY  06/23/04   Adenom/hyperplastic polyps; multiple external hemorrhoids  . COLONOSCOPY  06/08/07   single small polyp-repeat 5 years  . ESOPHAGOGASTRODUODENOSCOPY  03/01/01   H.H.; gastritis esoph. dudodenitis  . NCS/Wrists Left Carpal  2/02   Min./right improved  . PFTs  10/2012   FVC 64%, FEV1 41%, ratio 0.62  . PROSTATE BIOPSY  12/04   positive; radioactive seed implant (Dr. Joelyn Oms)  . Prostate Ext Beam Radiation  1/27-07/23/03   Dr. Danny Lawless  . SPIROMETRY  09/2011   WNL  . Wrist Release  6/98; 9/99   Right    SOCIAL HISTORY: Social History   Socioeconomic History  . Marital status: Legally Separated    Spouse name: Not on file  . Number of children: 5  . Years of education: Not on file  . Highest education level: Not on file  Social Needs  . Financial resource strain: Not on file  . Food insecurity - worry: Not on file  . Food  insecurity - inability: Not on file  . Transportation needs - medical: Not on file  . Transportation needs - non-medical: Not on file  Occupational History  . Occupation: Retired  Tobacco Use  . Smoking status: Former Smoker    Last attempt to quit: 05/23/1993    Years since quitting: 24.1  . Smokeless tobacco: Never Used  Substance and Sexual Activity  . Alcohol use: Yes    Comment: Rare  . Drug use: No  . Sexual activity: Yes  Other Topics Concern  . 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 Cancer Mother        colon  . Hyperlipidemia Mother   . Other Mother        Carotid disease  . Coronary artery disease Neg Hx   . Stroke Neg Hx     ALLERGIES:  has No Known Allergies.  MEDICATIONS:  Current Outpatient Medications  Medication Sig Dispense Refill  . aspirin EC 81 MG tablet Take 81 mg by mouth daily.    Marland Kitchen leuprolide (LUPRON) 22.5 MG injection Inject 22.5 mg into the muscle every 3 (three) months.    Marland Kitchen lisinopril (PRINIVIL,ZESTRIL) 10 MG tablet TAKE 1 TABLET EVERY DAY 90 tablet 2  . omeprazole (PRILOSEC) 40 MG capsule Take 1 capsule (40 mg total) by mouth daily. 90 capsule 3  . rosuvastatin (CRESTOR) 10 MG tablet Take 1 tablet (10 mg total) by mouth daily. 90 tablet 3  . timolol (TIMOPTIC) 0.5 % ophthalmic solution Place 1 drop into the left eye 2 (two) times daily.     Marland Kitchen venlafaxine XR (EFFEXOR-XR) 37.5 MG 24 hr capsule Take 1 capsule (37.5 mg total) by mouth daily with breakfast. 90 capsule 3  . methylcellulose packet Take 1 each by mouth as needed for constipation. Reported on 11/05/2015     No current facility-administered medications for this visit.       Marland Kitchen  PHYSICAL EXAMINATION: ECOG PERFORMANCE STATUS: 0 - Asymptomatic  Vitals:   07/12/17 0952  BP: (!) 181/75  Pulse: 68  Resp: 18  Temp: (!) 95.4 F (35.2 C)   Filed Weights   07/12/17 0952  Weight: 139 lb (63 kg)    GENERAL: Well-nourished well-developed; Alert, no distress and comfortable.  He is alone. EYES: no pallor or icterus OROPHARYNX: no thrush or ulceration; good dentition  NECK: supple, no masses felt LYMPH:  no palpable lymphadenopathy in the cervical, axillary or inguinal regions LUNGS: clear to auscultation and  No wheeze or crackles HEART/CVS: regular rate & rhythm and no murmurs; No lower  extremity edema ABDOMEN: abdomen soft, non-tender and normal bowel sounds Musculoskeletal:no cyanosis of digits and no clubbing  PSYCH: alert & oriented x 3 with fluent speech NEURO: no focal motor/sensory deficits SKIN:  no rashes or significant lesions  Results for FARHAN, JEAN (MRN [id number]) as of 06/21/2017 09:56  Ref. Range 10/25/1995 00:00 01/15/1998 00:00 12/23/1998 00:00 11/02/1999 00:00 12/22/2000 00:00 03/08/2002 00:00 04/11/2003 00:00 04/19/2005 00:00 05/11/2006 00:00 06/18/2008 09:28 06/24/2009 09:15 06/25/2010 08:47 07/08/2011 08:01 08/01/2012 08:20 08/02/2013 08:35 08/06/2014 08:06 03/27/2015 08:44 10/28/2015 08:58 12/03/2015 09:45 03/03/2016 09:35 06/02/2016 09:26 08/29/2016 09:45 02/27/2017 14:21 05/24/2017 09:14  PSA Latest Ref Range: 0.00 - 4.00 ng/mL 5.3  4.4 6.5 5.5 5.8 4.9 6.8 1.22 0.60 0.42 0.56 0.77 1.13 1.54 3.39 5.61 (H)   2.40 0.77 0.60 0.58    Prostate Specific Ag, Serum Latest Ref Range: 0.0 - 4.0 ng/mL                 11.1 (H) 19.4 (H)        Prostatic Specific Antigen Latest Ref Range: 0.00 - 4.00 ng/mL                       0.97 0.89     LABORATORY DATA:  I have reviewed the data as listed Lab Results  Component Value Date   WBC 9.0 07/12/2017   HGB 8.0 (L) 07/12/2017   HCT 25.3 (L) 07/12/2017   MCV 77.8 (L) 07/12/2017   PLT 431 07/12/2017   Recent Labs    08/29/16 0945 11/28/16 1335 02/27/17 1427  NA 135 137 138  K 3.7 4.3 3.7  CL 101 103 102  CO2 25 26 27   GLUCOSE 149* 97 164*  BUN 34* 25* 29*  CREATININE 1.06 0.95 1.07  CALCIUM 9.4 9.6 9.4  GFRNONAA >60 >60 >60  GFRAA >60 >60 >60  PROT 7.6  --  8.2*  ALBUMIN 3.7  --  3.2*  AST 20  --  16  ALT 14*  --  13*  ALKPHOS 61  --  62  BILITOT 0.6  --  0.3    RADIOGRAPHIC STUDIES: I have personally reviewed the radiological images as listed and agreed with the findings in the report. No results found.  ASSESSMENT & PLAN:   Normocytic anemia # Anemia- nadir Hb 7.8 worsening/symptomatic; unclear etiology.  OCT  2018- BMBx- mildly hypercellular ? Low grade MDS; MDS FISH-panel normal.  Discussed with Dr.Baker; awaiting on NGS.  Today hemoglobin 8.0 hold PRBC transfusion.  # Metastatic prostate cancer Hormone sensitive- biochemical recurrence [M0]- Jan 2019- 0.9. Lupron 05/24/2017.   # weight loss- recommend boost ensure/   # Elevated blood pressure- rechecked -180s/90s; 130s at home; ? white coat HNT.   # follow up in 2 month/ MD;labs- hold tube/possible transfusion;  also in 2 weeks- cbc/possible PRBC      Thomas Sickle, MD 07/12/2017 10:14 AM

## 2017-07-12 NOTE — Assessment & Plan Note (Addendum)
#   Anemia- nadir Hb 7.8 worsening/symptomatic; unclear etiology.  OCT 2018- BMBx- mildly hypercellular ? Low grade MDS; MDS FISH-panel normal.  Discussed with Dr.Baker; awaiting on NGS.  Today hemoglobin 8.0 hold PRBC transfusion.  # Metastatic prostate cancer Hormone sensitive- biochemical recurrence [M0]- Jan 2019- 0.9. Lupron 05/24/2017.   # weight loss- recommend boost ensure/   # Elevated blood pressure- rechecked -180s/90s; 130s at home; ? white coat HNT.   # follow up in 2 month/ MD;labs- hold tube/possible transfusion;  also in 2 weeks- cbc/possible PRBC

## 2017-07-12 NOTE — Progress Notes (Signed)
Here for follow up  Stated daughter brings him ensure but doesn't drink. Weight down.   Per pt stated " I feel ok "

## 2017-07-24 ENCOUNTER — Other Ambulatory Visit: Payer: Self-pay | Admitting: *Deleted

## 2017-07-24 ENCOUNTER — Telehealth: Payer: Self-pay | Admitting: Internal Medicine

## 2017-07-24 ENCOUNTER — Inpatient Hospital Stay: Payer: Medicare PPO | Attending: Internal Medicine

## 2017-07-24 ENCOUNTER — Inpatient Hospital Stay: Payer: Medicare PPO

## 2017-07-24 DIAGNOSIS — C61 Malignant neoplasm of prostate: Secondary | ICD-10-CM

## 2017-07-24 DIAGNOSIS — D649 Anemia, unspecified: Secondary | ICD-10-CM | POA: Insufficient documentation

## 2017-07-24 DIAGNOSIS — Z79818 Long term (current) use of other agents affecting estrogen receptors and estrogen levels: Secondary | ICD-10-CM | POA: Diagnosis not present

## 2017-07-24 DIAGNOSIS — Z923 Personal history of irradiation: Secondary | ICD-10-CM | POA: Insufficient documentation

## 2017-07-24 LAB — PREPARE RBC (CROSSMATCH)

## 2017-07-24 LAB — CBC WITH DIFFERENTIAL/PLATELET
Basophils Absolute: 0 10*3/uL (ref 0–0.1)
Basophils Relative: 0 %
EOS ABS: 0.1 10*3/uL (ref 0–0.7)
EOS PCT: 1 %
HCT: 24.6 % — ABNORMAL LOW (ref 40.0–52.0)
Hemoglobin: 7.8 g/dL — ABNORMAL LOW (ref 13.0–18.0)
LYMPHS ABS: 0.9 10*3/uL — AB (ref 1.0–3.6)
Lymphocytes Relative: 9 %
MCH: 24.8 pg — AB (ref 26.0–34.0)
MCHC: 31.8 g/dL — AB (ref 32.0–36.0)
MCV: 77.9 fL — AB (ref 80.0–100.0)
MONO ABS: 0.4 10*3/uL (ref 0.2–1.0)
MONOS PCT: 5 %
Neutro Abs: 7.9 10*3/uL — ABNORMAL HIGH (ref 1.4–6.5)
Neutrophils Relative %: 85 %
PLATELETS: 412 10*3/uL (ref 150–440)
RBC: 3.16 MIL/uL — ABNORMAL LOW (ref 4.40–5.90)
RDW: 20.2 % — AB (ref 11.5–14.5)
WBC: 9.3 10*3/uL (ref 3.8–10.6)

## 2017-07-24 LAB — SAMPLE TO BLOOD BANK

## 2017-07-24 MED ORDER — SODIUM CHLORIDE 0.9 % IV SOLN
250.0000 mL | Freq: Once | INTRAVENOUS | Status: AC
  Administered 2017-07-24: 250 mL via INTRAVENOUS
  Filled 2017-07-24: qty 250

## 2017-07-24 MED ORDER — DIPHENHYDRAMINE HCL 25 MG PO CAPS
25.0000 mg | ORAL_CAPSULE | Freq: Once | ORAL | Status: AC
  Administered 2017-07-24: 25 mg via ORAL
  Filled 2017-07-24: qty 1

## 2017-07-24 MED ORDER — ACETAMINOPHEN 325 MG PO TABS
650.0000 mg | ORAL_TABLET | Freq: Once | ORAL | Status: AC
  Administered 2017-07-24: 650 mg via ORAL
  Filled 2017-07-24: qty 2

## 2017-07-24 NOTE — Telephone Encounter (Signed)
Colette, pls initiate a referral to Dr. Arlyss Gandy or one of his colleagues.

## 2017-07-24 NOTE — Telephone Encounter (Signed)
Pt's wife concerned re: pt's declining condition; discussed my concerns of MDS-although not proven on the bone marrow biopsy. Discussed that Foundation was ordered by pathologist.   Nira Conn- please speak to the patient/if interested will make a referral to [facility] or [facility] for a second opinion.   Discussed with Dr. Melina Copa; still awaiting on the foundation 1 testing.

## 2017-07-24 NOTE — Telephone Encounter (Signed)
I did speak to the patient. He is agreeable to 2nd opinion referral to [facility].  I will facilitate referral

## 2017-07-25 LAB — TYPE AND SCREEN
ABO/RH(D): A POS
Antibody Screen: NEGATIVE
Unit division: 0

## 2017-07-25 LAB — BPAM RBC
BLOOD PRODUCT EXPIRATION DATE: 201903062359
ISSUE DATE / TIME: 201903041101
Unit Type and Rh: 5100

## 2017-07-26 ENCOUNTER — Other Ambulatory Visit: Payer: Self-pay | Admitting: *Deleted

## 2017-07-26 DIAGNOSIS — C61 Malignant neoplasm of prostate: Secondary | ICD-10-CM

## 2017-07-26 DIAGNOSIS — R739 Hyperglycemia, unspecified: Secondary | ICD-10-CM

## 2017-07-26 DIAGNOSIS — D500 Iron deficiency anemia secondary to blood loss (chronic): Secondary | ICD-10-CM

## 2017-07-27 ENCOUNTER — Telehealth: Payer: Self-pay | Admitting: *Deleted

## 2017-07-27 NOTE — Telephone Encounter (Signed)
Ebony Hail at [facility]. [place] [facility] office [phone]). Called to clarify patient's referring diagnosis. I explained to Ebony Hail that pt is being referred for anemia; h/o prostate cancer. Negative bone marrow bx workup. She states that another benign hem provider may need to evaluate this patient instead of Dr. Royce Macadamia. She will send the records for review by that team and will contact our office back with these apts.

## 2017-07-31 DIAGNOSIS — H401133 Primary open-angle glaucoma, bilateral, severe stage: Secondary | ICD-10-CM | POA: Diagnosis not present

## 2017-08-01 DIAGNOSIS — R634 Abnormal weight loss: Secondary | ICD-10-CM | POA: Diagnosis not present

## 2017-08-01 DIAGNOSIS — D649 Anemia, unspecified: Secondary | ICD-10-CM | POA: Diagnosis not present

## 2017-08-01 DIAGNOSIS — D4620 Refractory anemia with excess of blasts, unspecified: Secondary | ICD-10-CM | POA: Diagnosis not present

## 2017-08-02 ENCOUNTER — Other Ambulatory Visit: Payer: Medicare PPO

## 2017-08-02 ENCOUNTER — Ambulatory Visit: Payer: Medicare PPO | Admitting: Internal Medicine

## 2017-08-02 ENCOUNTER — Ambulatory Visit: Payer: Medicare PPO

## 2017-08-30 ENCOUNTER — Other Ambulatory Visit: Payer: Self-pay

## 2017-08-30 ENCOUNTER — Inpatient Hospital Stay: Payer: Medicare PPO

## 2017-08-30 ENCOUNTER — Inpatient Hospital Stay: Payer: Medicare PPO | Attending: Internal Medicine

## 2017-08-30 ENCOUNTER — Inpatient Hospital Stay (HOSPITAL_BASED_OUTPATIENT_CLINIC_OR_DEPARTMENT_OTHER): Payer: Medicare PPO | Admitting: Internal Medicine

## 2017-08-30 ENCOUNTER — Encounter: Payer: Self-pay | Admitting: Internal Medicine

## 2017-08-30 VITALS — BP 109/59 | HR 84 | Temp 97.0°F | Resp 20 | Ht 68.0 in | Wt 142.0 lb

## 2017-08-30 DIAGNOSIS — R634 Abnormal weight loss: Secondary | ICD-10-CM

## 2017-08-30 DIAGNOSIS — Z7982 Long term (current) use of aspirin: Secondary | ICD-10-CM | POA: Diagnosis not present

## 2017-08-30 DIAGNOSIS — R232 Flushing: Secondary | ICD-10-CM

## 2017-08-30 DIAGNOSIS — I10 Essential (primary) hypertension: Secondary | ICD-10-CM | POA: Diagnosis not present

## 2017-08-30 DIAGNOSIS — E785 Hyperlipidemia, unspecified: Secondary | ICD-10-CM | POA: Diagnosis not present

## 2017-08-30 DIAGNOSIS — Z87891 Personal history of nicotine dependence: Secondary | ICD-10-CM | POA: Insufficient documentation

## 2017-08-30 DIAGNOSIS — I2510 Atherosclerotic heart disease of native coronary artery without angina pectoris: Secondary | ICD-10-CM | POA: Diagnosis not present

## 2017-08-30 DIAGNOSIS — Z79899 Other long term (current) drug therapy: Secondary | ICD-10-CM

## 2017-08-30 DIAGNOSIS — D469 Myelodysplastic syndrome, unspecified: Secondary | ICD-10-CM

## 2017-08-30 DIAGNOSIS — K589 Irritable bowel syndrome without diarrhea: Secondary | ICD-10-CM

## 2017-08-30 DIAGNOSIS — Z923 Personal history of irradiation: Secondary | ICD-10-CM

## 2017-08-30 DIAGNOSIS — C61 Malignant neoplasm of prostate: Secondary | ICD-10-CM

## 2017-08-30 DIAGNOSIS — Z79818 Long term (current) use of other agents affecting estrogen receptors and estrogen levels: Secondary | ICD-10-CM | POA: Insufficient documentation

## 2017-08-30 DIAGNOSIS — K219 Gastro-esophageal reflux disease without esophagitis: Secondary | ICD-10-CM | POA: Diagnosis not present

## 2017-08-30 DIAGNOSIS — H409 Unspecified glaucoma: Secondary | ICD-10-CM

## 2017-08-30 DIAGNOSIS — D649 Anemia, unspecified: Secondary | ICD-10-CM

## 2017-08-30 LAB — CBC WITH DIFFERENTIAL/PLATELET
BASOS ABS: 0 10*3/uL (ref 0–0.1)
BASOS PCT: 1 %
EOS PCT: 1 %
Eosinophils Absolute: 0.1 10*3/uL (ref 0–0.7)
HCT: 26.9 % — ABNORMAL LOW (ref 40.0–52.0)
Hemoglobin: 8.5 g/dL — ABNORMAL LOW (ref 13.0–18.0)
Lymphocytes Relative: 13 %
Lymphs Abs: 1 10*3/uL (ref 1.0–3.6)
MCH: 25.4 pg — ABNORMAL LOW (ref 26.0–34.0)
MCHC: 31.7 g/dL — ABNORMAL LOW (ref 32.0–36.0)
MCV: 80.3 fL (ref 80.0–100.0)
MONO ABS: 0.3 10*3/uL (ref 0.2–1.0)
Monocytes Relative: 4 %
NEUTROS ABS: 5.8 10*3/uL (ref 1.4–6.5)
Neutrophils Relative %: 81 %
PLATELETS: 366 10*3/uL (ref 150–440)
RBC: 3.35 MIL/uL — AB (ref 4.40–5.90)
RDW: 20.6 % — AB (ref 11.5–14.5)
WBC: 7.2 10*3/uL (ref 3.8–10.6)

## 2017-08-30 LAB — SAMPLE TO BLOOD BANK

## 2017-08-30 NOTE — Progress Notes (Signed)
[facility] NOTE  Patient Care Team: [name], MD as PCP - General (Family Medicine) Bond, Tracie Harrier, MD as Consulting Physician (Ophthalmology)  CHIEF COMPLAINTS/PURPOSE OF CONSULTATION:  Oncology History   # external beam radiation therapy and radiation seed implant in 2005 with Dr. Joelyn Oms and Dr. Danny Lawless for T1c Gleason 3+4 = 7 prostate cancer with a pretreatment PSA of 6.8. In November 2015, he was found to have biochemical recurrence.  Dec 2016- CT a/p & Bone scan- NEG; PSA- 11; June 2017- 19; STARTED on Firmagon [Dr.Budzyn]; on Lupron q 43M  # OCT 2018- BMBx- hypercellular 30%; relative myeloid hyperplasia/erythroid hypoplasia; FISH- 5/7/8-Normal.  # JAN 2018- Dexa scan- N     Primary adenocarcinoma of prostate ([place])     HISTORY OF PRESENTING ILLNESS:  [name] 84 y.o.  male very pleasant patient with anemia unclear etiology-currently on IV iron; also history of prostate cancer/with biochemical recurrence on ADT is here for follow-up.  In the interim patient was evaluated at [facility] for his anemia of unclear etiology.   Patient received his last blood transfusion was appx 4 weeks ago.  He denies any significant fatigue.  He states his appetite is improved; however he has lost significant weight in the last many months.  Denies any bone pain. Has hot flashes which are stable. No significant weight loss no new constipation.   ROS: A complete 10 point review of system is done which is negative except mentioned above in history of present illness  MEDICAL HISTORY:  Past Medical History:  Diagnosis Date  . Aorto-iliac atherosclerosis ([place]) 04/2015   by CT scan  . Carotid stenosis 11/2014   mild bilateral 1-39%, rpt 2 yrs  . Colon polyps    rpt colonoscopy due 2014  . ED (erectile dysfunction)    Trimix and VED  . Esophagitis   . Essential hypertension 06/29/2009  . GERD (gastroesophageal reflux disease)   . Glaucoma    severe ([place])   . HLD (hyperlipidemia)   . HTN (hypertension)   . Hyperglycemia   . IBS (irritable bowel syndrome)   . Prostate cancer [facility]) 2005   s/p seed implant, EBRT (Dr. Alinda Money at [facility]) recurrent treating with androgen deprivation referred to cancer center  . Smoking history     SURGICAL HISTORY: Past Surgical History:  Procedure Laterality Date  . CATARACT EXTRACTION, BILATERAL  December 2016   [place], Dr. Raynelle Fanning  . COLONOSCOPY  03/01/01   Multiple polyps, divertic//adenomatous polyps  . COLONOSCOPY  04/24/01   multiple polyps  . COLONOSCOPY  10/23/01   Polyps benign-repeat every 2 years  . COLONOSCOPY  06/23/04   [place]om/hyperplastic polyps; multiple external hemorrhoids  . COLONOSCOPY  06/08/07   single small polyp-repeat 5 years  . ESOPHAGOGASTRODUODENOSCOPY  03/01/01   H.H.; gastritis esoph. dudodenitis  . NCS/Wrists Left Carpal  2/02   Min./right improved  . PFTs  10/2012   FVC 64%, FEV1 41%, ratio 0.62  . PROSTATE BIOPSY  12/04   positive; radioactive seed implant (Dr. Joelyn Oms)  . Prostate Ext Beam Radiation  1/27-07/23/03   Dr. Danny Lawless  . SPIROMETRY  09/2011   WNL  . Wrist Release  6/98; 9/99   Right    SOCIAL HISTORY: Social History   Socioeconomic History  . Marital status: Legally Separated    Spouse name: Not on file  . Number of children: 5  . Years of education: Not on file  . Highest education  level: Not on file  Occupational History  . Occupation: Retired  Scientific laboratory technician  . Financial resource strain: Not on file  . Food insecurity:    Worry: Not on file    Inability: Not on file  . Transportation needs:    Medical: Not on file    Non-medical: Not on file  Tobacco Use  . Smoking status: Former Smoker    Last attempt to quit: 05/23/1993    Years since quitting: 24.2  . Smokeless tobacco: Never Used  Substance and Sexual Activity  . Alcohol use: Yes    Comment: Rare  . Drug use: No  . Sexual activity: Yes  Lifestyle  . Physical  activity:    Days per week: Not on file    Minutes per session: Not on file  . Stress: Not on file  Relationships  . Social connections:    Talks on phone: Not on file    Gets together: Not on file    Attends religious service: Not on file    Active member of club or organization: Not on file    Attends meetings of clubs or organizations: Not on file    Relationship status: Not on file  . Intimate partner violence:    Fear of current or ex partner: Not on file    Emotionally abused: Not on file    Physically abused: Not on file    Forced sexual activity: Not on file  Other Topics Concern  . 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 Cancer Mother        colon  . Hyperlipidemia Mother   . Other Mother        Carotid disease  . Coronary artery disease Neg Hx   . Stroke Neg Hx     ALLERGIES:  has No Known Allergies.  MEDICATIONS:  Current Outpatient Medications  Medication Sig Dispense Refill  . aspirin EC 81 MG tablet Take 81 mg by mouth daily.    Marland Kitchen leuprolide (LUPRON) 22.5 MG injection Inject 22.5 mg into the muscle every 3 (three) months.    Marland Kitchen lisinopril (PRINIVIL,ZESTRIL) 10 MG tablet TAKE 1 TABLET EVERY DAY 90 tablet 2  . methylcellulose packet Take 1 each by mouth as needed for constipation. Reported on 11/05/2015    . omeprazole (PRILOSEC) 40 MG capsule Take 1 capsule (40 mg total) by mouth daily. 90 capsule 3  . rosuvastatin (CRESTOR) 10 MG tablet Take 1 tablet (10 mg total) by mouth daily. 90 tablet 3  . timolol (TIMOPTIC) 0.5 % ophthalmic solution Place 1 drop into the left eye 2 (two) times daily.     Marland Kitchen venlafaxine XR (EFFEXOR-XR) 37.5 MG 24 hr capsule Take 1 capsule (37.5 mg total) by mouth daily with breakfast. 90  capsule 3   No current facility-administered medications for this visit.       Marland Kitchen  PHYSICAL EXAMINATION: ECOG PERFORMANCE STATUS: 0 - Asymptomatic  Vitals:   08/30/17 0915  BP: (!) 109/59  Pulse: 84  Resp: 20  Temp: (!) 97 F (36.1 C)   Filed Weights   08/30/17 0911  Weight: 142 lb (64.4 kg)    GENERAL: Well-nourished well-developed; Alert, no distress and comfortable.  He is  alone. EYES: no pallor or icterus OROPHARYNX: no thrush or ulceration; good dentition  NECK: supple, no masses felt LYMPH:  no palpable lymphadenopathy in the cervical, axillary or inguinal regions LUNGS: clear to auscultation and  No wheeze or crackles HEART/CVS: regular rate & rhythm and no murmurs; No lower extremity edema ABDOMEN: abdomen soft, non-tender and normal bowel sounds Musculoskeletal:no cyanosis of digits and no clubbing  PSYCH: alert & oriented x 3 with fluent speech NEURO: no focal motor/sensory deficits SKIN:  no rashes or significant lesions  Results for ANTONEY, BIVEN (MRN [id number]) as of 06/21/2017 09:56  Ref. Range 10/25/1995 00:00 01/15/1998 00:00 12/23/1998 00:00 11/02/1999 00:00 12/22/2000 00:00 03/08/2002 00:00 04/11/2003 00:00 04/19/2005 00:00 05/11/2006 00:00 06/18/2008 09:28 06/24/2009 09:15 06/25/2010 08:47 07/08/2011 08:01 08/01/2012 08:20 08/02/2013 08:35 08/06/2014 08:06 03/27/2015 08:44 10/28/2015 08:58 12/03/2015 09:45 03/03/2016 09:35 06/02/2016 09:26 08/29/2016 09:45 02/27/2017 14:21 05/24/2017 09:14  PSA Latest Ref Range: 0.00 - 4.00 ng/mL 5.3 4.4 6.5 5.5 5.8 4.9 6.8 1.22 0.60 0.42 0.56 0.77 1.13 1.54 3.39 5.61 (H)   2.40 0.77 0.60 0.58    Prostate Specific Ag, Serum Latest Ref Range: 0.0 - 4.0 ng/mL                 11.1 (H) 19.4 (H)        Prostatic Specific Antigen Latest Ref Range: 0.00 - 4.00 ng/mL                       0.97 0.89     LABORATORY DATA:  I have reviewed the data as listed Lab Results  Component Value Date   WBC 7.2 08/30/2017   HGB 8.5 (L) 08/30/2017   HCT 26.9 (L)  08/30/2017   MCV 80.3 08/30/2017   PLT 366 08/30/2017   Recent Labs    11/28/16 1335 02/27/17 1427  NA 137 138  K 4.3 3.7  CL 103 102  CO2 26 27  GLUCOSE 97 164*  BUN 25* 29*  CREATININE 0.95 1.07  CALCIUM 9.6 9.4  GFRNONAA >60 >60  GFRAA >60 >60  PROT  --  8.2*  ALBUMIN  --  3.2*  AST  --  16  ALT  --  13*  ALKPHOS  --  62  BILITOT  --  0.3    RADIOGRAPHIC STUDIES: I have personally reviewed the radiological images as listed and agreed with the findings in the report. No results found.  ASSESSMENT & PLAN:   Normocytic anemia # Anemia- nadir Hb 7.8 worsening/symptomatic; unclear etiology.  OCT 2018- BMBx- mildly hypercellular ? Low grade MDS; MDS FISH-panel normal.  Discussed with Dr.Bulterr; awaiting on NGS.  Today hemoglobin 8.5 hold PRBC transfusion.  # Metastatic prostate cancer Hormone sensitive- biochemical recurrence [M0]- Jan 2019- 0.9. Lupron 05/24/2017.   # weight loss- recommend boost ensure/also recommend CT of the chest/abdomen pelvis for further evaluation.   # Elevated blood pressure--improved today.  #Follow up in approximately 3 weeks/labs hold tube/Lupron; CT scans ASAP   Primary adenocarcinoma of prostate ([place]) # Anemia- nadir Hb 7.8 worsening/symptomatic; unclear etiology.  OCT 2018- BMBx- mildly hypercellular ? Low grade MDS; MDS FISH-panel normal.  Discussed with Dr.Butler; awaiting on NGS.  Today hemoglobin 8.5 hold PRBC transfusion.  # Metastatic prostate cancer Hormone sensitive- biochemical recurrence [M0]- Jan 2019- 0.9. Lupron 05/24/2017.   # weight loss- recommend CT chest/ab/pelvis with contrast.   # Elevated blood pressure- rechecked -180s/90s; 130s at home; ? white coat HNT.   # follow  up in 3 weeks/ - hold tube/possible transfusion;Lupron. CT ASAP.       Cammie Sickle, MD 08/30/2017 1:47 PM

## 2017-08-30 NOTE — Assessment & Plan Note (Addendum)
#   Anemia- nadir Hb 7.8 worsening/symptomatic; unclear etiology.  OCT 2018- BMBx- mildly hypercellular ? Low grade MDS; MDS FISH-panel normal.  Discussed with Dr.Bulterr; awaiting on NGS.  Today hemoglobin 8.5 hold PRBC transfusion.  # Metastatic prostate cancer Hormone sensitive- biochemical recurrence [M0]- Jan 2019- 0.9. Lupron 05/24/2017.   # weight loss- recommend boost ensure/also recommend CT of the chest/abdomen pelvis for further evaluation.   # Elevated blood pressure--improved today.  #Follow up in approximately 3 weeks/labs hold tube/Lupron; CT scans ASAP

## 2017-09-05 ENCOUNTER — Ambulatory Visit
Admission: RE | Admit: 2017-09-05 | Discharge: 2017-09-05 | Disposition: A | Discharge status: Home / Self Care | Payer: Medicare PPO | Source: Ambulatory Visit | Attending: Internal Medicine | Admitting: Internal Medicine

## 2017-09-05 DIAGNOSIS — C61 Malignant neoplasm of prostate: Secondary | ICD-10-CM | POA: Diagnosis not present

## 2017-09-05 DIAGNOSIS — I2510 Atherosclerotic heart disease of native coronary artery without angina pectoris: Secondary | ICD-10-CM | POA: Diagnosis not present

## 2017-09-05 DIAGNOSIS — N1330 Unspecified hydronephrosis: Secondary | ICD-10-CM | POA: Insufficient documentation

## 2017-09-05 DIAGNOSIS — I700 Atherosclerosis of aorta: Secondary | ICD-10-CM | POA: Diagnosis not present

## 2017-09-05 DIAGNOSIS — R634 Abnormal weight loss: Secondary | ICD-10-CM | POA: Diagnosis not present

## 2017-09-05 DIAGNOSIS — J439 Emphysema, unspecified: Secondary | ICD-10-CM | POA: Diagnosis not present

## 2017-09-05 MED ORDER — IOHEXOL 300 MG/ML  SOLN
100.0000 mL | Freq: Once | INTRAMUSCULAR | Status: AC | PRN
  Administered 2017-09-05: 100 mL via INTRAVENOUS

## 2017-09-12 DIAGNOSIS — I10 Essential (primary) hypertension: Secondary | ICD-10-CM | POA: Diagnosis not present

## 2017-09-12 DIAGNOSIS — D649 Anemia, unspecified: Secondary | ICD-10-CM | POA: Diagnosis not present

## 2017-09-12 DIAGNOSIS — Z8546 Personal history of malignant neoplasm of prostate: Secondary | ICD-10-CM | POA: Diagnosis not present

## 2017-09-12 DIAGNOSIS — H401123 Primary open-angle glaucoma, left eye, severe stage: Secondary | ICD-10-CM | POA: Diagnosis not present

## 2017-09-12 DIAGNOSIS — Z87891 Personal history of nicotine dependence: Secondary | ICD-10-CM | POA: Diagnosis not present

## 2017-09-20 ENCOUNTER — Other Ambulatory Visit: Payer: Self-pay

## 2017-09-20 ENCOUNTER — Inpatient Hospital Stay: Payer: Medicare PPO

## 2017-09-20 ENCOUNTER — Inpatient Hospital Stay: Payer: Medicare PPO | Attending: Internal Medicine

## 2017-09-20 ENCOUNTER — Encounter: Payer: Self-pay | Admitting: Internal Medicine

## 2017-09-20 ENCOUNTER — Inpatient Hospital Stay (HOSPITAL_BASED_OUTPATIENT_CLINIC_OR_DEPARTMENT_OTHER): Payer: Medicare PPO | Admitting: Internal Medicine

## 2017-09-20 VITALS — BP 108/60 | HR 98 | Temp 98.9°F | Resp 20 | Ht 68.0 in | Wt 144.5 lb

## 2017-09-20 DIAGNOSIS — I700 Atherosclerosis of aorta: Secondary | ICD-10-CM

## 2017-09-20 DIAGNOSIS — K219 Gastro-esophageal reflux disease without esophagitis: Secondary | ICD-10-CM | POA: Diagnosis not present

## 2017-09-20 DIAGNOSIS — Z79899 Other long term (current) drug therapy: Secondary | ICD-10-CM | POA: Insufficient documentation

## 2017-09-20 DIAGNOSIS — R3915 Urgency of urination: Secondary | ICD-10-CM | POA: Diagnosis not present

## 2017-09-20 DIAGNOSIS — J439 Emphysema, unspecified: Secondary | ICD-10-CM

## 2017-09-20 DIAGNOSIS — Z87891 Personal history of nicotine dependence: Secondary | ICD-10-CM

## 2017-09-20 DIAGNOSIS — C61 Malignant neoplasm of prostate: Secondary | ICD-10-CM

## 2017-09-20 DIAGNOSIS — Z923 Personal history of irradiation: Secondary | ICD-10-CM | POA: Diagnosis not present

## 2017-09-20 DIAGNOSIS — N1330 Unspecified hydronephrosis: Secondary | ICD-10-CM | POA: Insufficient documentation

## 2017-09-20 DIAGNOSIS — N400 Enlarged prostate without lower urinary tract symptoms: Secondary | ICD-10-CM | POA: Insufficient documentation

## 2017-09-20 DIAGNOSIS — D649 Anemia, unspecified: Secondary | ICD-10-CM | POA: Insufficient documentation

## 2017-09-20 DIAGNOSIS — Z79818 Long term (current) use of other agents affecting estrogen receptors and estrogen levels: Secondary | ICD-10-CM | POA: Insufficient documentation

## 2017-09-20 DIAGNOSIS — Z7982 Long term (current) use of aspirin: Secondary | ICD-10-CM | POA: Insufficient documentation

## 2017-09-20 DIAGNOSIS — I10 Essential (primary) hypertension: Secondary | ICD-10-CM | POA: Diagnosis not present

## 2017-09-20 DIAGNOSIS — N134 Hydroureter: Secondary | ICD-10-CM | POA: Insufficient documentation

## 2017-09-20 DIAGNOSIS — R5383 Other fatigue: Secondary | ICD-10-CM | POA: Diagnosis not present

## 2017-09-20 DIAGNOSIS — R5381 Other malaise: Secondary | ICD-10-CM | POA: Diagnosis not present

## 2017-09-20 DIAGNOSIS — R350 Frequency of micturition: Secondary | ICD-10-CM

## 2017-09-20 DIAGNOSIS — R5382 Chronic fatigue, unspecified: Secondary | ICD-10-CM | POA: Diagnosis not present

## 2017-09-20 DIAGNOSIS — E785 Hyperlipidemia, unspecified: Secondary | ICD-10-CM

## 2017-09-20 DIAGNOSIS — I2510 Atherosclerotic heart disease of native coronary artery without angina pectoris: Secondary | ICD-10-CM | POA: Insufficient documentation

## 2017-09-20 LAB — COMPREHENSIVE METABOLIC PANEL
ALBUMIN: 3.5 g/dL (ref 3.5–5.0)
ALK PHOS: 62 U/L (ref 38–126)
ALT: 11 U/L — AB (ref 17–63)
AST: 15 U/L (ref 15–41)
Anion gap: 11 (ref 5–15)
BILIRUBIN TOTAL: 0.4 mg/dL (ref 0.3–1.2)
BUN: 53 mg/dL — ABNORMAL HIGH (ref 6–20)
CALCIUM: 9.9 mg/dL (ref 8.9–10.3)
CO2: 21 mmol/L — AB (ref 22–32)
CREATININE: 1.44 mg/dL — AB (ref 0.61–1.24)
Chloride: 108 mmol/L (ref 101–111)
GFR calc Af Amer: 51 mL/min — ABNORMAL LOW (ref >60)
GFR calc non Af Amer: 44 mL/min — ABNORMAL LOW (ref >60)
GLUCOSE: 115 mg/dL — AB (ref 65–99)
Potassium: 4.2 mmol/L (ref 3.5–5.1)
SODIUM: 140 mmol/L (ref 135–145)
Total Protein: 9.1 g/dL — ABNORMAL HIGH (ref 6.5–8.1)

## 2017-09-20 LAB — CBC WITH DIFFERENTIAL/PLATELET
Basophils Absolute: 0 10*3/uL (ref 0–0.1)
Basophils Relative: 0 %
EOS ABS: 0 10*3/uL (ref 0–0.7)
Eosinophils Relative: 1 %
HEMATOCRIT: 27.1 % — AB (ref 40.0–52.0)
HEMOGLOBIN: 8.8 g/dL — AB (ref 13.0–18.0)
LYMPHS ABS: 0.8 10*3/uL — AB (ref 1.0–3.6)
Lymphocytes Relative: 9 %
MCH: 25.7 pg — AB (ref 26.0–34.0)
MCHC: 32.3 g/dL (ref 32.0–36.0)
MCV: 79.5 fL — ABNORMAL LOW (ref 80.0–100.0)
Monocytes Absolute: 0.3 10*3/uL (ref 0.2–1.0)
Monocytes Relative: 4 %
NEUTROS PCT: 86 %
Neutro Abs: 7.6 10*3/uL — ABNORMAL HIGH (ref 1.4–6.5)
Platelets: 350 10*3/uL (ref 150–440)
RBC: 3.41 MIL/uL — AB (ref 4.40–5.90)
RDW: 20.1 % — ABNORMAL HIGH (ref 11.5–14.5)
WBC: 8.8 10*3/uL (ref 3.8–10.6)

## 2017-09-20 LAB — C-REACTIVE PROTEIN: CRP: 2 mg/dL — AB (ref <1.0)

## 2017-09-20 LAB — SAMPLE TO BLOOD BANK

## 2017-09-20 MED ORDER — TAMSULOSIN HCL 0.4 MG PO CAPS: 0.4000 mg | ORAL_CAPSULE | Freq: Every day | ORAL | 3 refills | Status: DC

## 2017-09-20 MED ORDER — LEUPROLIDE ACETATE (3 MONTH) 22.5 MG IM KIT
22.5000 mg | PACK | Freq: Once | INTRAMUSCULAR | Status: AC
  Administered 2017-09-20: 22.5 mg via INTRAMUSCULAR
  Filled 2017-09-20: qty 22.5

## 2017-09-20 NOTE — Assessment & Plan Note (Addendum)
#   Anemia- nadir Hb 7.8 worsening/symptomatic; unclear etiology.  OCT 2018- BMBx- mildly hypercellular ? Low grade MDS; MDS FISH-panel normal.  Today hemoglobin 8.8 hold PRBC transfusion.  #CT scan shows- Bil Hydronephrosis/hydroureter- creat 1.4/ refer back to [facility].   # Metastatic prostate cancer Hormone sensitive- biochemical recurrence [M0]- Jan 2019- 0.9. Lupron 22.5 mgagain today [09/20/2017]   #Weight loss-unclear etiology CT scan shows no obvious evidence of active malignancy except for hydronephrosis as reviewed above.   # Prostatism symtoms- recommend start flomax; refer to urology  #Follow up in approximately 4 weeks/labs hold tube; lupron today; refer to urology.   # I reviewed the blood work- with the patient in detail; also reviewed the imaging independently [as summarized above]; and with the patient in detail.

## 2017-09-20 NOTE — Progress Notes (Signed)
[facility] NOTE  Patient Care Team: [name], MD as PCP - General (Family Medicine) [place], Tracie Harrier, MD as Consulting Physician (Ophthalmology)  CHIEF COMPLAINTS/PURPOSE OF CONSULTATION:  Oncology History   # external beam radiation therapy and radiation seed implant in 2005 with Dr. Joelyn Oms and Dr. Danny Lawless for T1c Gleason 3+4 = 7 prostate cancer with a pretreatment PSA of 6.8. In November 2015, he was found to have biochemical recurrence.  Dec 2016- CT a/p & Bone scan- NEG; PSA- 11; June 2017- 19; STARTED on Firmagon [Dr.Budzyn]; on Lupron q 58M  # OCT 2018- BMBx- hypercellular 30%; relative myeloid hyperplasia/erythroid hypoplasia; FISH- 5/7/8-Normal. II OPINION at [facility].   # JAN 2018- Dexa scan- N     Primary adenocarcinoma of prostate ([place])     HISTORY OF PRESENTING ILLNESS:  Thomas Lopez 84 y.o.  male very pleasant patient with anemia unclear etiology-currently on IV iron; also history of prostate cancer/with biochemical recurrence on ADT is here for follow-up/ Reviewed the results of his CT scan abdomen pelvis.  Patient admits to increased frequency of urination especially at nighttime.  Also complains of urgency/and weak stream.  His appetite is fair.  Chronic mild fatigue.  Not any worse.  Stable.  Denies any blood in stools black or stools.  ROS: A complete 10 point review of system is done which is negative except mentioned above in history of present illness  MEDICAL HISTORY:  Past Medical History:  Diagnosis Date  . Aorto-iliac atherosclerosis ([place]) 04/2015   by CT scan  . Carotid stenosis 11/2014   mild bilateral 1-39%, rpt 2 yrs  . Colon polyps    rpt colonoscopy due 2014  . ED (erectile dysfunction)    Trimix and VED  . Esophagitis   . Essential hypertension 06/29/2009  . GERD (gastroesophageal reflux disease)   . Glaucoma    severe ([place])  . HLD (hyperlipidemia)   . HTN (hypertension)   . Hyperglycemia   . IBS  (irritable bowel syndrome)   . Prostate cancer [facility]) 2005   s/p seed implant, EBRT (Dr. Alinda Money at [facility]) recurrent treating with androgen deprivation referred to cancer center  . Smoking history     SURGICAL HISTORY: Past Surgical History:  Procedure Laterality Date  . CATARACT EXTRACTION, BILATERAL  December 2016   [place], Dr. Raynelle Fanning  . COLONOSCOPY  03/01/01   Multiple polyps, divertic//adenomatous polyps  . COLONOSCOPY  04/24/01   multiple polyps  . COLONOSCOPY  10/23/01   Polyps benign-repeat every 2 years  . COLONOSCOPY  06/23/04   Adenom/hyperplastic polyps; multiple external hemorrhoids  . COLONOSCOPY  06/08/07   single small polyp-repeat 5 years  . ESOPHAGOGASTRODUODENOSCOPY  03/01/01   H.H.; gastritis esoph. dudodenitis  . NCS/Wrists Left Carpal  2/02   Min./right improved  . PFTs  10/2012   FVC 64%, FEV1 41%, ratio 0.62  . PROSTATE BIOPSY  12/04   positive; radioactive seed implant (Dr. Joelyn Oms)  . Prostate Ext Beam Radiation  1/27-07/23/03   Dr. Danny Lawless  . SPIROMETRY  09/2011   WNL  . Wrist Release  6/98; 9/99   Right    SOCIAL HISTORY: Social History   Socioeconomic History  . Marital status: Legally Separated    Spouse name: Not on file  . Number of children: 5  . Years of education: Not on file  . Highest education level: Not on file  Occupational History  . Occupation: Retired  Scientific laboratory technician  .  Financial resource strain: Not on file  . Food insecurity:    Worry: Not on file    Inability: Not on file  . Transportation needs:    Medical: Not on file    Non-medical: Not on file  Tobacco Use  . Smoking status: Former Smoker    Last attempt to quit: 05/23/1993    Years since quitting: 24.3  . Smokeless tobacco: Never Used  Substance and Sexual Activity  . Alcohol use: Yes    Comment: Rare  . Drug use: No  . Sexual activity: Yes  Lifestyle  . Physical activity:    Days per week: Not on file    Minutes per session: Not on file   . Stress: Not on file  Relationships  . Social connections:    Talks on phone: Not on file    Gets together: Not on file    Attends religious service: Not on file    Active member of club or organization: Not on file    Attends meetings of clubs or organizations: Not on file    Relationship status: Not on file  . Intimate partner violence:    Fear of current or ex partner: Not on file    Emotionally abused: Not on file    Physically abused: Not on file    Forced sexual activity: Not on file  Other Topics Concern  . 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 Cancer Mother        colon  . Hyperlipidemia Mother   . Other Mother        Carotid disease  . Coronary artery disease Neg Hx   . Stroke Neg Hx     ALLERGIES:  has No Known Allergies.  MEDICATIONS:  Current Outpatient Medications  Medication Sig Dispense Refill  . aspirin EC 81 MG tablet Take 81 mg by mouth daily.    Marland Kitchen leuprolide (LUPRON) 22.5 MG injection Inject 22.5 mg into the muscle every 3 (three) months.    Marland Kitchen lisinopril (PRINIVIL,ZESTRIL) 10 MG tablet TAKE 1 TABLET EVERY DAY 90 tablet 2  . methylcellulose packet Take 1 each by mouth as needed for constipation. Reported on 11/05/2015    . omeprazole (PRILOSEC) 40 MG capsule Take 1 capsule (40 mg total) by mouth daily. 90 capsule 3  . rosuvastatin (CRESTOR) 10 MG tablet Take 1 tablet (10 mg total) by mouth daily. 90 tablet 3  . timolol (TIMOPTIC) 0.5 % ophthalmic solution Place 1 drop into the left eye 2 (two) times daily.     Marland Kitchen venlafaxine XR (EFFEXOR-XR) 37.5 MG 24 hr capsule Take 1 capsule (37.5 mg total) by mouth daily with breakfast. 90 capsule 3  . tamsulosin (FLOMAX) 0.4 MG CAPS capsule Take 1 capsule (0.4 mg total)  by mouth daily after supper. 90 capsule 3   No current facility-administered medications for this visit.       Marland Kitchen  PHYSICAL EXAMINATION: ECOG PERFORMANCE STATUS: 0 - Asymptomatic  Vitals:   09/20/17 0918  BP: 108/60  Pulse: 98  Resp: 20  Temp: 98.9 F (37.2 C)   Filed Weights   09/20/17 0918  Weight: 144 lb 8 oz (65.5 kg)    GENERAL: Well-nourished well-developed; Alert, no  distress and comfortable.  He accompanied by his wife. EYES: no pallor or icterus OROPHARYNX: no thrush or ulceration; good dentition  NECK: supple, no masses felt LYMPH:  no palpable lymphadenopathy in the cervical, axillary or inguinal regions LUNGS: clear to auscultation and  No wheeze or crackles HEART/CVS: regular rate & rhythm and no murmurs; No lower extremity edema ABDOMEN: abdomen soft, non-tender and normal bowel sounds Musculoskeletal:no cyanosis of digits and no clubbing  PSYCH: alert & oriented x 3 with fluent speech NEURO: no focal motor/sensory deficits SKIN:  no rashes or significant lesions  Results for SHELDON, SEM (MRN [id number]) as of 06/21/2017 09:56  Ref. Range 10/25/1995 00:00 01/15/1998 00:00 12/23/1998 00:00 11/02/1999 00:00 12/22/2000 00:00 03/08/2002 00:00 04/11/2003 00:00 04/19/2005 00:00 05/11/2006 00:00 06/18/2008 09:28 06/24/2009 09:15 06/25/2010 08:47 07/08/2011 08:01 08/01/2012 08:20 08/02/2013 08:35 08/06/2014 08:06 03/27/2015 08:44 10/28/2015 08:58 12/03/2015 09:45 03/03/2016 09:35 06/02/2016 09:26 08/29/2016 09:45 02/27/2017 14:21 05/24/2017 09:14  PSA Latest Ref Range: 0.00 - 4.00 ng/mL 5.3 4.4 6.5 5.5 5.8 4.9 6.8 1.22 0.60 0.42 0.56 0.77 1.13 1.54 3.39 5.61 (H)   2.40 0.77 0.60 0.58    Prostate Specific Ag, Serum Latest Ref Range: 0.0 - 4.0 ng/mL                 11.1 (H) 19.4 (H)        Prostatic Specific Antigen Latest Ref Range: 0.00 - 4.00 ng/mL                       0.97 0.89     LABORATORY DATA:  I have reviewed the data as listed Lab Results  Component Value Date   WBC 8.8  09/20/2017   HGB 8.8 (L) 09/20/2017   HCT 27.1 (L) 09/20/2017   MCV 79.5 (L) 09/20/2017   PLT 350 09/20/2017   Recent Labs    11/28/16 1335 02/27/17 1427 09/20/17 0904  NA 137 138 140  K 4.3 3.7 4.2  CL 103 102 108  CO2 26 27 21*  GLUCOSE 97 164* 115*  BUN 25* 29* 53*  CREATININE 0.95 1.07 1.44*  CALCIUM 9.6 9.4 9.9  GFRNONAA >60 >60 44*  GFRAA >60 >60 51*  PROT  --  8.2* 9.1*  ALBUMIN  --  3.2* 3.5  AST  --  16 15  ALT  --  13* 11*  ALKPHOS  --  62 62  BILITOT  --  0.3 0.4    RADIOGRAPHIC STUDIES: I have personally reviewed the radiological images as listed and agreed with the findings in the report. Ct Chest W Contrast  Result Date: 09/06/2017 CLINICAL DATA:  Prostate cancer.  Weight loss. EXAM: CT CHEST, ABDOMEN, AND PELVIS WITH CONTRAST TECHNIQUE: Multidetector CT imaging of the chest, abdomen and pelvis was performed following the standard protocol during bolus administration of intravenous contrast. CONTRAST:  132mL OMNIPAQUE IOHEXOL 300 MG/ML  SOLN COMPARISON:  CT AP 04/24/2015 FINDINGS: CT CHEST FINDINGS Cardiovascular: Normal heart size. No pericardial effusion. Aortic atherosclerosis. Calcification within the LAD, RCA and left circumflex coronary artery. Mediastinum/Nodes: Normal appearance of the thyroid gland. The trachea appears patent and is midline. Normal appearance of the esophagus. No enlarged mediastinal or hilar adenopathy. No enlarged mediastinal or hilar lymph nodes. Lungs/Pleura: No pleural effusion. Moderate changes of emphysema. No suspicious pulmonary nodules. Musculoskeletal: Spondylosis identified within the thoracic spine. No aggressive lytic or sclerotic bone lesions. CT ABDOMEN PELVIS FINDINGS Hepatobiliary: No focal liver abnormality is seen. No gallstones, gallbladder wall  thickening, or biliary dilatation. Pancreas: Unremarkable. No pancreatic ductal dilatation or surrounding inflammatory changes. Spleen: Normal in size without focal abnormality.  Adrenals/Urinary Tract: Normal adrenal glands. Small hypodensities in the right kidney are too small to characterize. There is new bilateral hydronephrosis and hydroureter to the level of the urinary bladder.Diffuse bladder wall trabeculation is identified suggestive of chronic bladder outlet obstruction. Stomach/Bowel: Stomach is within normal limits. No evidence of bowel wall thickening, distention, or inflammatory changes. Vascular/Lymphatic: Aortic atherosclerosis. No aneurysm. No enlarged upper abdominal lymph nodes. No enlarged pelvic or inguinal adenopathy. Reproductive: Seed implants are identified within the prostate gland. Other: No abdominal wall hernia or abnormality. No abdominopelvic ascites. Musculoskeletal: Spondylosis noted within the thoracic spine. No suspicious bone lesions. IMPRESSION: 1. No evidence for metastatic disease within the chest, abdomen or pelvis. No suspicious bone lesions. 2. Interval development of bilateral hydronephrosis and hydroureter to the level of the urinary bladder. There is diffuse bladder wall trabeculation which suggest chronic bladder outlet obstruction. 3. Seed implants identified within the prostate gland. 4. Aortic Atherosclerosis (ICD10-I70.0) and Emphysema (ICD10-J43.9). Three vessel coronary artery atherosclerotic calcifications noted. Electronically Signed   By: Kerby Moors M.D.   On: 09/06/2017 10:02   Ct Abdomen Pelvis W Contrast  Result Date: 09/06/2017 CLINICAL DATA:  Prostate cancer.  Weight loss. EXAM: CT CHEST, ABDOMEN, AND PELVIS WITH CONTRAST TECHNIQUE: Multidetector CT imaging of the chest, abdomen and pelvis was performed following the standard protocol during bolus administration of intravenous contrast. CONTRAST:  149mL OMNIPAQUE IOHEXOL 300 MG/ML  SOLN COMPARISON:  CT AP 04/24/2015 FINDINGS: CT CHEST FINDINGS Cardiovascular: Normal heart size. No pericardial effusion. Aortic atherosclerosis. Calcification within the LAD, RCA and left  circumflex coronary artery. Mediastinum/Nodes: Normal appearance of the thyroid gland. The trachea appears patent and is midline. Normal appearance of the esophagus. No enlarged mediastinal or hilar adenopathy. No enlarged mediastinal or hilar lymph nodes. Lungs/Pleura: No pleural effusion. Moderate changes of emphysema. No suspicious pulmonary nodules. Musculoskeletal: Spondylosis identified within the thoracic spine. No aggressive lytic or sclerotic bone lesions. CT ABDOMEN PELVIS FINDINGS Hepatobiliary: No focal liver abnormality is seen. No gallstones, gallbladder wall thickening, or biliary dilatation. Pancreas: Unremarkable. No pancreatic ductal dilatation or surrounding inflammatory changes. Spleen: Normal in size without focal abnormality. Adrenals/Urinary Tract: Normal adrenal glands. Small hypodensities in the right kidney are too small to characterize. There is new bilateral hydronephrosis and hydroureter to the level of the urinary bladder.Diffuse bladder wall trabeculation is identified suggestive of chronic bladder outlet obstruction. Stomach/Bowel: Stomach is within normal limits. No evidence of bowel wall thickening, distention, or inflammatory changes. Vascular/Lymphatic: Aortic atherosclerosis. No aneurysm. No enlarged upper abdominal lymph nodes. No enlarged pelvic or inguinal adenopathy. Reproductive: Seed implants are identified within the prostate gland. Other: No abdominal wall hernia or abnormality. No abdominopelvic ascites. Musculoskeletal: Spondylosis noted within the thoracic spine. No suspicious bone lesions. IMPRESSION: 1. No evidence for metastatic disease within the chest, abdomen or pelvis. No suspicious bone lesions. 2. Interval development of bilateral hydronephrosis and hydroureter to the level of the urinary bladder. There is diffuse bladder wall trabeculation which suggest chronic bladder outlet obstruction. 3. Seed implants identified within the prostate gland. 4. Aortic  Atherosclerosis (ICD10-I70.0) and Emphysema (ICD10-J43.9). Three vessel coronary artery atherosclerotic calcifications noted. Electronically Signed   By: Kerby Moors M.D.   On: 09/06/2017 10:02   IMPRESSION: 1. No evidence for metastatic disease within the chest, abdomen or pelvis. No suspicious bone lesions. 2. Interval development of bilateral hydronephrosis and hydroureter to  the level of the urinary bladder. There is diffuse bladder wall trabeculation which suggest chronic bladder outlet obstruction. 3. Seed implants identified within the prostate gland. 4. Aortic Atherosclerosis (ICD10-I70.0) and Emphysema (ICD10-J43.9). Three vessel coronary artery atherosclerotic calcifications noted.   Electronically Signed   By: Kerby Moors M.D.   On: 09/06/2017 10:02  ASSESSMENT & PLAN:   Normocytic anemia # Anemia- nadir Hb 7.8 worsening/symptomatic; unclear etiology.  OCT 2018- BMBx- mildly hypercellular ? Low grade MDS; MDS FISH-panel normal.  Today hemoglobin 8.8 hold PRBC transfusion.  #CT scan shows- Bil Hydronephrosis/hydroureter- creat 1.4/ refer back to [facility].   # Metastatic prostate cancer Hormone sensitive- biochemical recurrence [M0]- Jan 2019- 0.9. Lupron 22.5 mgagain today [09/20/2017]   #Weight loss-unclear etiology CT scan shows no obvious evidence of active malignancy except for hydronephrosis as reviewed above.   # Prostatism symtoms- recommend start flomax; refer to urology  #Follow up in approximately 4 weeks/labs hold tube; lupron today; refer to urology.   # I reviewed the blood work- with the patient in detail; also reviewed the imaging independently [as summarized above]; and with the patient in detail.         Cammie Sickle, MD 10/03/2017 3:54 PM

## 2017-10-03 ENCOUNTER — Telehealth: Payer: Self-pay | Admitting: Internal Medicine

## 2017-10-03 NOTE — Telephone Encounter (Signed)
Please call [facility]-[when you get a chance; no hurries]-and check if his bone marrow was ever sent [facility] for molecular testing. If not-we will plan to get a [facility] heme/at next visit.  Thx

## 2017-10-04 NOTE — Telephone Encounter (Signed)
Spoke with md- he would like to keep the lab/md apt on 5/29. There is no need to come any sooner for this [facility]. Lab tests will be drawn on this day and Dr. B will consent the patient about [facility] at that visit.

## 2017-10-04 NOTE — Telephone Encounter (Signed)
I contacted [facility] and spoke with Quita Skye. He states that they received a sample on July 26, 2017 - but the tissue was insufficient.  Is the [facility] test okay to be drawn at patient's next appt on 5/29 - or does patient need to come in sooner? Please advise.

## 2017-10-11 ENCOUNTER — Other Ambulatory Visit: Payer: Self-pay | Admitting: Family Medicine

## 2017-10-11 ENCOUNTER — Ambulatory Visit (INDEPENDENT_AMBULATORY_CARE_PROVIDER_SITE_OTHER): Payer: Medicare PPO

## 2017-10-11 VITALS — BP 102/58 | HR 68 | Temp 97.6°F | Ht 67.5 in | Wt 132.5 lb

## 2017-10-11 DIAGNOSIS — D500 Iron deficiency anemia secondary to blood loss (chronic): Secondary | ICD-10-CM | POA: Diagnosis not present

## 2017-10-11 DIAGNOSIS — E782 Mixed hyperlipidemia: Secondary | ICD-10-CM

## 2017-10-11 DIAGNOSIS — R739 Hyperglycemia, unspecified: Secondary | ICD-10-CM | POA: Diagnosis not present

## 2017-10-11 DIAGNOSIS — C61 Malignant neoplasm of prostate: Secondary | ICD-10-CM | POA: Diagnosis not present

## 2017-10-11 DIAGNOSIS — Z0000 Encounter for general adult medical examination without abnormal findings: Secondary | ICD-10-CM

## 2017-10-11 LAB — BASIC METABOLIC PANEL
BUN: 52 mg/dL — AB (ref 6–23)
CHLORIDE: 106 meq/L (ref 96–112)
CO2: 22 meq/L (ref 19–32)
Calcium: 9.6 mg/dL (ref 8.4–10.5)
Creatinine, Ser: 1.53 mg/dL — ABNORMAL HIGH (ref 0.40–1.50)
GFR: 56.19 mL/min — AB (ref >60.00)
GLUCOSE: 112 mg/dL — AB (ref 70–99)
Potassium: 4.1 mEq/L (ref 3.5–5.1)
Sodium: 141 mEq/L (ref 135–145)

## 2017-10-11 LAB — IBC PANEL
Iron: 17 ug/dL — ABNORMAL LOW (ref 42–165)
SATURATION RATIOS: 6.2 % — AB (ref 20.0–50.0)
Transferrin: 195 mg/dL — ABNORMAL LOW (ref 212.0–360.0)

## 2017-10-11 LAB — LIPID PANEL
CHOL/HDL RATIO: 3
Cholesterol: 114 mg/dL (ref 0–200)
HDL: 34.8 mg/dL — AB (ref >39.00)
LDL CALC: 55 mg/dL (ref 0–99)
NONHDL: 79.03
Triglycerides: 119 mg/dL (ref 0.0–149.0)
VLDL: 23.8 mg/dL (ref 0.0–40.0)

## 2017-10-11 LAB — PSA: PSA: 1.77 ng/mL (ref 0.10–4.00)

## 2017-10-11 LAB — HEMOGLOBIN A1C: Hgb A1c MFr Bld: 6.6 % — ABNORMAL HIGH (ref 4.6–6.5)

## 2017-10-11 LAB — FERRITIN: FERRITIN: 452.9 ng/mL — AB (ref 22.0–322.0)

## 2017-10-11 NOTE — Progress Notes (Signed)
PCP notes:   Health maintenance:  Tetanus vaccine - postponed/insurance Colonoscopy - per pt, he does not need to repeat screening  Abnormal screenings:   Hearing - failed  Hearing Screening   125Hz  250Hz  500Hz  1000Hz  2000Hz  3000Hz  4000Hz  6000Hz  8000Hz   Right ear:   0 40 40  0    Left ear:   40 40 40  0     Mini-Cog score: 18/20 MMSE - Mini Mental State Exam 10/11/2017 08/15/2016 08/14/2015  Orientation to time 5 5 5   Orientation to Place 5 5 5   Registration 3 3 3   Attention/ Calculation 0 0 0  Recall 1 3 3   Recall-comments unable to recall 2 of 3 words - -  Language- name 2 objects 0 0 0  Language- repeat 1 1 1   Language- follow 3 step command 3 3 3   Language- read & follow direction 0 0 0  Write a sentence 0 0 0  Copy design 0 0 0  Total score 18 20 20    Patient concerns:   None  Nurse concerns:  None  Next PCP appt:   10/25/17 @ 1600

## 2017-10-11 NOTE — Patient Instructions (Signed)
Mr. Thomas Lopez , Thank you for taking time to come for your Medicare Wellness Visit. I appreciate your ongoing commitment to your health goals. Please review the following plan we discussed and let me know if I can assist you in the future.   These are the goals we discussed: Goals    . Eat more fruits and vegetables     Starting 10/11/2017, I will increase my intake of fresh fruits and vegetables to at least 5 servings per day.        This is a list of the screening recommended for you and due dates:  Health Maintenance  Topic Date Due  . DTaP/Tdap/Td vaccine (1 - Tdap) 10/12/2018*  . Tetanus Vaccine  10/12/2018*  . Colon Cancer Screening  10/21/2018*  . Flu Shot  12/21/2017  . Pneumonia vaccines  Completed  *Topic was postponed. The date shown is not the original due date.   Preventive Care for Adults  A healthy lifestyle and preventive care can promote health and wellness. Preventive health guidelines for adults include the following key practices.  . A routine yearly physical is a good way to check with your health care provider about your health and preventive screening. It is a chance to share any concerns and updates on your health and to receive a thorough exam.  . Visit your dentist for a routine exam and preventive care every 6 months. Brush your teeth twice a day and floss once a day. Good oral hygiene prevents tooth decay and gum disease.  . The frequency of eye exams is based on your age, health, family medical history, use  of contact lenses, and other factors. Follow your health care provider's recommendations for frequency of eye exams.  . Eat a healthy diet. Foods like vegetables, fruits, whole grains, low-fat dairy products, and lean protein foods contain the nutrients you need without too many calories. Decrease your intake of foods high in solid fats, added sugars, and salt. Eat the right amount of calories for you. Get information about a proper diet from your health  care provider, if necessary.  . Regular physical exercise is one of the most important things you can do for your health. Most adults should get at least 150 minutes of moderate-intensity exercise (any activity that increases your heart rate and causes you to sweat) each week. In addition, most adults need muscle-strengthening exercises on 2 or more days a week.  Silver Sneakers may be a benefit available to you. To determine eligibility, you may visit the website: www.silversneakers.com or contact program at 281-648-7083 Mon-Fri between 8AM-8PM.   . Maintain a healthy weight. The body mass index (BMI) is a screening tool to identify possible weight problems. It provides an estimate of body fat based on height and weight. Your health care provider can find your BMI and can help you achieve or maintain a healthy weight.   For adults 20 years and older: ? A BMI below 18.5 is considered underweight. ? A BMI of 18.5 to 24.9 is normal. ? A BMI of 25 to 29.9 is considered overweight. ? A BMI of 30 and above is considered obese.   . Maintain normal blood lipids and cholesterol levels by exercising and minimizing your intake of saturated fat. Eat a balanced diet with plenty of fruit and vegetables. Blood tests for lipids and cholesterol should begin at age 66 and be repeated every 5 years. If your lipid or cholesterol levels are high, you are over 50, or you  are at high risk for heart disease, you may need your cholesterol levels checked more frequently. Ongoing high lipid and cholesterol levels should be treated with medicines if diet and exercise are not working.  . If you smoke, find out from your health care provider how to quit. If you do not use tobacco, please do not start.  . If you choose to drink alcohol, please do not consume more than 2 drinks per day. One drink is considered to be 12 ounces (355 mL) of beer, 5 ounces (148 mL) of wine, or 1.5 ounces (44 mL) of liquor.  . If you are 74-51  years old, ask your health care provider if you should take aspirin to prevent strokes.  . Use sunscreen. Apply sunscreen liberally and repeatedly throughout the day. You should seek shade when your shadow is shorter than you. Protect yourself by wearing long sleeves, pants, a wide-brimmed hat, and sunglasses year round, whenever you are outdoors.  . Once a month, do a whole body skin exam, using a mirror to look at the skin on your back. Tell your health care provider of new moles, moles that have irregular borders, moles that are larger than a pencil eraser, or moles that have changed in shape or color.

## 2017-10-11 NOTE — Progress Notes (Signed)
Subjective:   Thomas Lopez is a 84 y.o. male who presents for Medicare Annual/Subsequent preventive examination.  Review of Systems:  N/A Cardiac Risk Factors include: advanced age (>31men, >18 women);male gender;hypertension;dyslipidemia     Objective:    Vitals: BP (!) 102/58 (BP Location: Right Arm, Patient Position: Sitting, Cuff Size: Normal)   Pulse 68   Temp 97.6 F (36.4 C) (Oral)   Ht 5' 7.5" (1.715 m) Comment: no shoes  Wt 132 lb 8 oz (60.1 kg)   SpO2 100%   BMI 20.45 kg/m   Body mass index is 20.45 kg/m.  Advanced Directives 10/11/2017 09/20/2017 08/30/2017 07/12/2017 06/21/2017 05/24/2017 03/31/2017  Does Patient Have a Medical Advance Directive? No No No No No No No  Type of Advance Directive - - - - - - -  Does patient want to make changes to medical advance directive? - - - - - - -  Copy of [place] in Chart? - - - - - - -  Would patient like information on creating a medical advance directive? No - Patient declined No - Patient declined No - Patient declined No - Patient declined No - Patient declined No - Patient declined No - Patient declined    Tobacco Social History   Tobacco Use  Smoking Status Former Smoker  . Last attempt to quit: 05/23/1993  . Years since quitting: 24.4  Smokeless Tobacco Never Used     Counseling given: No   Clinical Intake:  Pre-visit preparation completed: Yes  Pain : No/denies pain Pain Score: 0-No pain     Nutritional Status: BMI of 19-24  Normal Nutritional Risks: Unintentional weight loss Diabetes: No  How often do you need to have someone help you when you read instructions, pamphlets, or other written materials from your doctor or pharmacy?: 1 - Never What is the last grade level you completed in school?: Associates degree  Interpreter Needed?: No  Comments: pt lives alone Information entered by :: LPinson, LPN  Past Medical History:  Diagnosis Date  . Aorto-iliac atherosclerosis ([place])  04/2015   by CT scan  . Carotid stenosis 11/2014   mild bilateral 1-39%, rpt 2 yrs  . Colon polyps    rpt colonoscopy due 2014  . ED (erectile dysfunction)    Trimix and VED  . Esophagitis   . Essential hypertension 06/29/2009  . GERD (gastroesophageal reflux disease)   . Glaucoma    severe ([place])  . HLD (hyperlipidemia)   . HTN (hypertension)   . Hyperglycemia   . IBS (irritable bowel syndrome)   . Prostate cancer [facility]) 2005   s/p seed implant, EBRT ([facility] at [facility]) recurrent treating with androgen deprivation referred to cancer center  . Smoking history    Past Surgical History:  Procedure Laterality Date  . CATARACT EXTRACTION Left 05/2017   Dr. Edilia Bo  . CATARACT EXTRACTION, BILATERAL  December 2016   [place], Dr. Raynelle Fanning  . COLONOSCOPY  03/01/01   Multiple polyps, divertic//adenomatous polyps  . COLONOSCOPY  04/24/01   multiple polyps  . COLONOSCOPY  10/23/01   Polyps benign-repeat every 2 years  . COLONOSCOPY  06/23/04   Adenom/hyperplastic polyps; multiple external hemorrhoids  . COLONOSCOPY  06/08/07   single small polyp-repeat 5 years  . ESOPHAGOGASTRODUODENOSCOPY  03/01/01   H.H.; gastritis esoph. dudodenitis  . NCS/Wrists Left Carpal  2/02   Min./right improved  . PFTs  10/2012   FVC 64%, FEV1 41%, ratio  0.62  . PROSTATE BIOPSY  12/04   positive; radioactive seed implant (Dr. Joelyn Oms)  . Prostate Ext Beam Radiation  1/27-07/23/03   Dr. Danny Lawless  . SPIROMETRY  09/2011   WNL  . Wrist Release  6/98; 9/99   Right   Family History  Problem Relation Age of Onset  . Cancer Mother        colon  . Hyperlipidemia Mother   . Other Mother        Carotid disease  . Coronary artery disease Neg Hx   . Stroke Neg Hx    Social History   Socioeconomic History  . Marital status: Legally Separated    Spouse name: Not on file  . Number of children: 5  . Years of education: Not on file  . Highest education level: Not on file  Occupational History   . Occupation: Retired  Scientific laboratory technician  . Financial resource strain: Not on file  . Food insecurity:    Worry: Not on file    Inability: Not on file  . Transportation needs:    Medical: Not on file    Non-medical: Not on file  Tobacco Use  . Smoking status: Former Smoker    Last attempt to quit: 05/23/1993    Years since quitting: 24.4  . Smokeless tobacco: Never Used  Substance and Sexual Activity  . Alcohol use: Yes    Comment: Rare  . Drug use: No  . Sexual activity: Yes  Lifestyle  . Physical activity:    Days per week: Not on file    Minutes per session: Not on file  . Stress: Not on file  Relationships  . Social connections:    Talks on phone: Not on file    Gets together: Not on file    Attends religious service: Not on file    Active member of club or organization: Not on file    Attends meetings of clubs or organizations: Not on file    Relationship status: Not on file  Other Topics Concern  . 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 aspirin EC 81 MG tablet Take 81 mg by mouth daily.  Marland Kitchen leuprolide (LUPRON) 22.5 MG injection Inject 22.5 mg into the muscle every 3 (three) months.  Marland Kitchen lisinopril (PRINIVIL,ZESTRIL) 10 MG tablet TAKE 1 TABLET EVERY DAY  . methylcellulose packet Take 1 each by mouth as needed for constipation. Reported on 11/05/2015  . omeprazole (PRILOSEC) 40 MG capsule Take 1 capsule (40 mg total) by mouth daily.  . rosuvastatin (CRESTOR) 10 MG tablet Take 1 tablet (10 mg total) by mouth daily.  . tamsulosin (FLOMAX) 0.4 MG CAPS capsule Take 1 capsule (0.4 mg total) by mouth daily after supper.  . venlafaxine XR (EFFEXOR-XR) 37.5 MG 24 hr capsule Take 1 capsule (37.5 mg total) by mouth daily with breakfast.   . timolol (TIMOPTIC) 0.5 % ophthalmic solution Place 1 drop into the left eye 2 (two) times daily.    No facility-administered encounter medications on file as of 10/11/2017.     Activities of Daily Living In your present state of health, do you have any difficulty performing the following activities: 10/11/2017  Hearing? N  Vision? Y  Difficulty concentrating or  making decisions? N  Walking or climbing stairs? N  Dressing or bathing? N  Doing errands, shopping? N  Preparing Food and eating ? N  Using the Toilet? N  In the past six months, have you accidently leaked urine? Y  Do you have problems with loss of bowel control? N  Managing your Medications? N  Managing your Finances? N  Housekeeping or managing your Housekeeping? N  Some recent data might be hidden    Patient Care Team: [name], MD as PCP - General (Family Medicine) Bond, Tracie Harrier, MD as Consulting Physician (Ophthalmology)   Assessment:   This is a routine wellness examination for Thomas Lopez.   Hearing Screening   125Hz  250Hz  500Hz  1000Hz  2000Hz  3000Hz  4000Hz  6000Hz  8000Hz   Right ear:   0 40 40  0    Left ear:   40 40 40  0    Vision Screening Comments: Vision exam in Jan 2019 with Dr. Edilia Bo   Exercise Activities and Dietary recommendations Current Exercise Habits: The patient does not participate in regular exercise at present, Exercise limited by: None identified  Goals    . Eat more fruits and vegetables     Starting 10/11/2017, I will increase my intake of fresh fruits and vegetables to at least 5 servings per day.        Fall Risk Fall Risk  10/11/2017 04/28/2017 08/15/2016 08/14/2015 08/13/2014  Falls in the past year? No No No No No  Number falls in past yr: - - - - -  Comment - - - - -  Injury with Fall? - - - - -  Comment - - - - -    Depression Screen PHQ 2/9 Scores 10/11/2017 08/15/2016 08/14/2015 08/13/2014  PHQ - 2 Score 0 0 0 0  PHQ- 9 Score 0 - - -    Cognitive Function MMSE -  Mini Mental State Exam 10/11/2017 08/15/2016 08/14/2015  Orientation to time 5 5 5   Orientation to Place 5 5 5   Registration 3 3 3   Attention/ Calculation 0 0 0  Recall 1 3 3   Recall-comments unable to recall 2 of 3 words - -  Language- name 2 objects 0 0 0  Language- repeat 1 1 1   Language- follow 3 step command 3 3 3   Language- read & follow direction 0 0 0  Write a sentence 0 0 0  Copy design 0 0 0  Total score 18 20 20      PLEASE NOTE: A Mini-Cog screen was completed. Maximum score is 20. A value of 0 denotes this part of [place] MMSE was not completed or the patient failed this part of the Mini-Cog screening.   Mini-Cog Screening Orientation to Time - Max 5 pts Orientation to Place - Max 5 pts Registration - Max 3 pts Recall - Max 3 pts Language Repeat - Max 1 pts Language Follow 3 Step Command - Max 3 pts     Immunization History  Administered Date(s) Administered  . H1N1 06/24/2008  . Influenza Split 03/03/2011, 02/29/2012  . Influenza Whole 04/06/2007, 02/19/2008, 03/24/2009, 02/25/2010  . Influenza,inj,Quad PF,6+ Mos 02/26/2013, 02/19/2014, 02/25/2015, 02/12/2016, 02/17/2017  . Pneumococcal Conjugate-13 08/09/2013  . Pneumococcal Polysaccharide-23 03/15/2002  . Td 05/26/2006  . Zoster 07/01/2010    Screening Tests Health Maintenance  Topic Date Due  . DTaP/Tdap/Td (1 - Tdap) 10/12/2018 (Originally 05/27/2006)  . TETANUS/TDAP  10/12/2018 (Originally 05/26/2016)  . COLONOSCOPY  10/21/2018 (Originally 09/24/2017)  . INFLUENZA VACCINE  12/21/2017  .  PNA vac Low Risk Adult  Completed      Plan:    I have personally reviewed, addressed, and noted the following in the patient's chart:  A. Medical and social history B. Use of alcohol, tobacco or illicit drugs  C. Current medications and supplements D. Functional ability and status E.  Nutritional status F.  Physical activity G. Advance directives H. List of other physicians I.  Hospitalizations, surgeries, and ER  visits in previous 12 months J.  [place] to include hearing, vision, cognitive, depression L. Referrals and appointments - none  In addition, I have reviewed and discussed with patient certain preventive protocols, quality metrics, and best practice recommendations. A written personalized care plan for preventive services as well as general preventive health recommendations were provided to patient.  See attached scanned questionnaire for additional information.   Signed,   Lindell Noe, MHA, BS, LPN Health Coach

## 2017-10-12 NOTE — Progress Notes (Signed)
   Subjective:    Patient ID: Thomas Lopez, male    DOB: 02/09/1933, 84 y.o.   MRN: [id number]  HPI  I reviewed health advisor's note, was available for consultation, and agree with documentation and plan.\   Review of Systems     Objective:   Physical Exam         Assessment & Plan:

## 2017-10-18 ENCOUNTER — Inpatient Hospital Stay (HOSPITAL_BASED_OUTPATIENT_CLINIC_OR_DEPARTMENT_OTHER): Payer: Medicare PPO | Admitting: Internal Medicine

## 2017-10-18 ENCOUNTER — Encounter: Payer: Self-pay | Admitting: Internal Medicine

## 2017-10-18 ENCOUNTER — Inpatient Hospital Stay: Payer: Medicare PPO

## 2017-10-18 ENCOUNTER — Other Ambulatory Visit: Payer: Self-pay

## 2017-10-18 ENCOUNTER — Other Ambulatory Visit: Payer: Self-pay | Admitting: Internal Medicine

## 2017-10-18 VITALS — BP 150/54 | HR 67 | Temp 97.0°F | Resp 20 | Ht 67.5 in | Wt 139.0 lb

## 2017-10-18 DIAGNOSIS — D649 Anemia, unspecified: Secondary | ICD-10-CM

## 2017-10-18 DIAGNOSIS — R5382 Chronic fatigue, unspecified: Secondary | ICD-10-CM

## 2017-10-18 DIAGNOSIS — C61 Malignant neoplasm of prostate: Secondary | ICD-10-CM

## 2017-10-18 DIAGNOSIS — Z923 Personal history of irradiation: Secondary | ICD-10-CM

## 2017-10-18 DIAGNOSIS — N1330 Unspecified hydronephrosis: Secondary | ICD-10-CM

## 2017-10-18 DIAGNOSIS — Z79818 Long term (current) use of other agents affecting estrogen receptors and estrogen levels: Secondary | ICD-10-CM | POA: Diagnosis not present

## 2017-10-18 DIAGNOSIS — Z7982 Long term (current) use of aspirin: Secondary | ICD-10-CM

## 2017-10-18 DIAGNOSIS — N400 Enlarged prostate without lower urinary tract symptoms: Secondary | ICD-10-CM | POA: Diagnosis not present

## 2017-10-18 DIAGNOSIS — R350 Frequency of micturition: Secondary | ICD-10-CM | POA: Diagnosis not present

## 2017-10-18 DIAGNOSIS — R5381 Other malaise: Secondary | ICD-10-CM

## 2017-10-18 DIAGNOSIS — N134 Hydroureter: Secondary | ICD-10-CM | POA: Diagnosis not present

## 2017-10-18 DIAGNOSIS — I2510 Atherosclerotic heart disease of native coronary artery without angina pectoris: Secondary | ICD-10-CM

## 2017-10-18 DIAGNOSIS — Z79899 Other long term (current) drug therapy: Secondary | ICD-10-CM

## 2017-10-18 DIAGNOSIS — R3915 Urgency of urination: Secondary | ICD-10-CM

## 2017-10-18 DIAGNOSIS — I10 Essential (primary) hypertension: Secondary | ICD-10-CM

## 2017-10-18 DIAGNOSIS — R5383 Other fatigue: Secondary | ICD-10-CM

## 2017-10-18 DIAGNOSIS — I700 Atherosclerosis of aorta: Secondary | ICD-10-CM

## 2017-10-18 DIAGNOSIS — E785 Hyperlipidemia, unspecified: Secondary | ICD-10-CM

## 2017-10-18 DIAGNOSIS — Z87891 Personal history of nicotine dependence: Secondary | ICD-10-CM

## 2017-10-18 DIAGNOSIS — J439 Emphysema, unspecified: Secondary | ICD-10-CM

## 2017-10-18 DIAGNOSIS — K219 Gastro-esophageal reflux disease without esophagitis: Secondary | ICD-10-CM

## 2017-10-18 LAB — CBC WITH DIFFERENTIAL/PLATELET
Basophils Absolute: 0 10*3/uL (ref 0–0.1)
Basophils Relative: 1 %
EOS PCT: 2 %
Eosinophils Absolute: 0.1 10*3/uL (ref 0–0.7)
HEMATOCRIT: 22.4 % — AB (ref 40.0–52.0)
Hemoglobin: 7.2 g/dL — ABNORMAL LOW (ref 13.0–18.0)
LYMPHS PCT: 13 %
Lymphs Abs: 0.9 10*3/uL — ABNORMAL LOW (ref 1.0–3.6)
MCH: 24.9 pg — ABNORMAL LOW (ref 26.0–34.0)
MCHC: 32 g/dL (ref 32.0–36.0)
MCV: 78 fL — AB (ref 80.0–100.0)
MONO ABS: 0.3 10*3/uL (ref 0.2–1.0)
MONOS PCT: 5 %
NEUTROS ABS: 5.5 10*3/uL (ref 1.4–6.5)
Neutrophils Relative %: 79 %
Platelets: 451 10*3/uL — ABNORMAL HIGH (ref 150–440)
RBC: 2.87 MIL/uL — ABNORMAL LOW (ref 4.40–5.90)
RDW: 19.6 % — AB (ref 11.5–14.5)
WBC: 6.9 10*3/uL (ref 3.8–10.6)

## 2017-10-18 LAB — PREPARE RBC (CROSSMATCH)

## 2017-10-18 LAB — SAMPLE TO BLOOD BANK

## 2017-10-18 NOTE — Progress Notes (Signed)
[facility] OFFICE PROGRESS NOTE  Patient Care Team: [name], MD as PCP - General (Family Medicine) Bond, Tracie Harrier, MD as Consulting Physician (Ophthalmology)  Cancer Staging No matching staging information was found for the patient.   Oncology History   # external beam radiation therapy and radiation seed implant in 2005 with Dr. Joelyn Oms and Dr. Danny Lawless for T1c Gleason 3+4 = 7 prostate cancer with a pretreatment PSA of 6.8. In November 2015, he was found to have biochemical recurrence.  Dec 2016- CT a/p & Bone scan- NEG; PSA- 11; June 2017- 19; STARTED on Firmagon [Dr.Budzyn]; on Lupron q 68M  # OCT 2018- BMBx- hypercellular 30%; relative myeloid hyperplasia/erythroid hypoplasia; FISH- 5/7/8-Normal. II OPINION at [facility].   # JAN 2018- Dexa scan- N     Primary adenocarcinoma of prostate ([place])      INTERVAL HISTORY:  Thomas Lopez 84 y.o.  male pleasant patient above history of anemia of unclear etiology is here for follow-up.  Patient denies any blood in stools black or stools.  Complains of fatigue.  Complains of increased frequency of urination.  He has not been evaluated by urology yet.  He denies any significant pain.  Complains of shortness of breath on exertion.  Review of Systems  Constitutional: Positive for malaise/fatigue. Negative for chills, diaphoresis, fever and weight loss.  HENT: Negative for nosebleeds and sore throat.   Eyes: Negative for double vision.  Respiratory: Positive for shortness of breath. Negative for cough, hemoptysis, sputum production and wheezing.   Cardiovascular: Negative for chest pain, palpitations, orthopnea and leg swelling.  Gastrointestinal: Negative for abdominal pain, blood in stool, constipation, diarrhea, heartburn, melena, nausea and vomiting.  Genitourinary: Negative for dysuria, frequency and urgency.  Musculoskeletal: Negative for back pain and joint pain.  Skin: Negative.  Negative for itching and  rash.  Neurological: Negative for dizziness, tingling, focal weakness, weakness and headaches.  Endo/Heme/Allergies: Does not bruise/bleed easily.  Psychiatric/Behavioral: Negative for depression. The patient is not nervous/anxious and does not have insomnia.       PAST MEDICAL HISTORY :  Past Medical History:  Diagnosis Date  . Aorto-iliac atherosclerosis ([place]) 04/2015   by CT scan  . Carotid stenosis 11/2014   mild bilateral 1-39%, rpt 2 yrs  . Colon polyps    rpt colonoscopy due 2014  . ED (erectile dysfunction)    Trimix and VED  . Esophagitis   . Essential hypertension 06/29/2009  . GERD (gastroesophageal reflux disease)   . Glaucoma    severe ([place])  . HLD (hyperlipidemia)   . HTN (hypertension)   . Hyperglycemia   . IBS (irritable bowel syndrome)   . Prostate cancer [facility]) 2005   s/p seed implant, EBRT ([facility]. Alinda [facility] at [facility]) recurrent treating with androgen deprivation referred to cancer center  . Smoking history     PAST SURGICAL HISTORY :   Past Surgical History:  Procedure Laterality Date  . CATARACT EXTRACTION Left 05/2017   Dr. Edilia Bo  . CATARACT EXTRACTION, BILATERAL  December 2016   [place], Dr. Raynelle Fanning  . COLONOSCOPY  03/01/01   Multiple polyps, divertic//adenomatous polyps  . COLONOSCOPY  04/24/01   multiple polyps  . COLONOSCOPY  10/23/01   Polyps benign-repeat every 2 years  . COLONOSCOPY  06/23/04   Adenom/hyperplastic polyps; multiple external hemorrhoids  . COLONOSCOPY  06/08/07   single small polyp-repeat 5 years  . ESOPHAGOGASTRODUODENOSCOPY  03/01/01   H.H.; gastritis esoph. dudodenitis  . NCS/Wrists  Left Carpal  2/02   Min./right improved  . PFTs  10/2012   FVC 64%, FEV1 41%, ratio 0.62  . PROSTATE BIOPSY  12/04   positive; radioactive seed implant (Dr. Joelyn Oms)  . Prostate Ext Beam Radiation  1/27-07/23/03   Dr. Danny Lawless  . SPIROMETRY  09/2011   WNL  . Wrist Release  6/98; 9/99   Right    FAMILY HISTORY :   Family  History  Problem Relation Age of Onset  . Cancer Mother        colon  . Hyperlipidemia Mother   . Other Mother        Carotid disease  . Coronary artery disease Neg Hx   . Stroke Neg Hx     SOCIAL HISTORY:   Social History   Tobacco Use  . Smoking status: Former Smoker    Last attempt to quit: 05/23/1993    Years since quitting: 24.4  . Smokeless tobacco: Never Used  Substance Use Topics  . Alcohol use: Yes    Comment: Rare  . Drug use: No    ALLERGIES:  has No Known Allergies.  MEDICATIONS:  Current Outpatient Medications  Medication Sig Dispense Refill  . aspirin EC 81 MG tablet Take 81 mg by mouth daily.    Marland Kitchen leuprolide (LUPRON) 22.5 MG injection Inject 22.5 mg into the muscle every 3 (three) months.    Marland Kitchen lisinopril (PRINIVIL,ZESTRIL) 10 MG tablet TAKE 1 TABLET EVERY DAY 90 tablet 2  . methylcellulose packet Take 1 each by mouth as needed for constipation. Reported on 11/05/2015    . omeprazole (PRILOSEC) 40 MG capsule Take 1 capsule (40 mg total) by mouth daily. 90 capsule 3  . rosuvastatin (CRESTOR) 10 MG tablet Take 1 tablet (10 mg total) by mouth daily. 90 tablet 3  . tamsulosin (FLOMAX) 0.4 MG CAPS capsule Take 1 capsule (0.4 mg total) by mouth daily after supper. 90 capsule 3  . timolol (TIMOPTIC) 0.5 % ophthalmic solution Place 1 drop into the left eye 2 (two) times daily.     Marland Kitchen venlafaxine XR (EFFEXOR-XR) 37.5 MG 24 hr capsule Take 1 capsule (37.5 mg total) by mouth daily with breakfast. 90 capsule 3   No current facility-administered medications for this visit.     PHYSICAL EXAMINATION: ECOG PERFORMANCE STATUS: 1 - Symptomatic but completely ambulatory  BP (!) 150/54 (Patient Position: Sitting)   Pulse 67   Temp (!) 97 F (36.1 C) (Tympanic)   Resp 20   Ht 5' 7.5" (1.715 m)   Wt 139 lb (63 kg)   BMI 21.45 kg/m   Filed Weights   10/18/17 1003  Weight: 139 lb (63 kg)    GENERAL: Well-nourished well-developed; Alert, no distress and comfortable.   Alone.   EYES: Positive for pallor OROPHARYNX: no thrush or ulceration; NECK: supple; no lymph nodes felt. LYMPH:  no palpable lymphadenopathy in the axillary or inguinal regions LUNGS: Decreased breath sounds auscultation bilaterally. No wheeze or crackles HEART/CVS: regular rate & rhythm and no murmurs; No lower extremity edema ABDOMEN:abdomen soft, non-tender and normal bowel sounds. No hepatomegaly or splenomegaly.  Musculoskeletal:no cyanosis of digits and no clubbing  PSYCH: alert & oriented x 3 with fluent speech NEURO: no focal motor/sensory deficits SKIN:  no rashes or significant lesions    LABORATORY DATA:  I have reviewed the data as listed    Component Value Date/Time   NA 141 10/11/2017 0927   K 4.1 10/11/2017 0927   CL 106 10/11/2017  0927   CO2 22 10/11/2017 0927   GLUCOSE 112 (H) 10/11/2017 0927   BUN 52 (H) 10/11/2017 0927   CREATININE 1.53 (H) 10/11/2017 0927   CALCIUM 9.6 10/11/2017 0927   PROT 9.1 (H) 09/20/2017 0904   ALBUMIN 3.5 09/20/2017 0904   AST 15 09/20/2017 0904   ALT 11 (L) 09/20/2017 0904   ALKPHOS 62 09/20/2017 0904   BILITOT 0.4 09/20/2017 0904   GFRNONAA 44 (L) 09/20/2017 0904   GFRAA 51 (L) 09/20/2017 0904    No results found for: SPEP, UPEP  Lab Results  Component Value Date   WBC 6.9 10/18/2017   NEUTROABS 5.5 10/18/2017   HGB 7.2 (L) 10/18/2017   HCT 22.4 (L) 10/18/2017   MCV 78.0 (L) 10/18/2017   PLT 451 (H) 10/18/2017      Chemistry      Component Value Date/Time   NA 141 10/11/2017 0927   K 4.1 10/11/2017 0927   CL 106 10/11/2017 0927   CO2 22 10/11/2017 0927   BUN 52 (H) 10/11/2017 0927   CREATININE 1.53 (H) 10/11/2017 0927      Component Value Date/Time   CALCIUM 9.6 10/11/2017 0927   ALKPHOS 62 09/20/2017 0904   AST 15 09/20/2017 0904   ALT 11 (L) 09/20/2017 0904   BILITOT 0.4 09/20/2017 0904       RADIOGRAPHIC STUDIES: I have personally reviewed the radiological images as listed and agreed with the  findings in the report. No results found.   ASSESSMENT & PLAN:  Normocytic anemia #Normocytic anemia-unclear etiology; worsened; today hemoglobin 7.7; patient symptomatic.  Proceed with PRBC transfusion 1 unit.  #Given the inconclusive-diagnosis even post bone marrow biopsy; discussed regarding NGS testing/foundation 1 heme.  Patient agrees.  #Bilateral hydro-nephrosis /hydroureter unclear etiology question bladder neck obstruction; new  creat 1.4/ refer back to [facility].   # Metastatic prostate cancer Hormone sensitive- biochemical recurrence [M0]-  [on Lupron 09/20/2017] -stable  # Prostatism symtoms-on flomax; worsened; refer to urology  #Follow up in approximately 8 weeks/labs hold tube;refer to urology.   Orders Placed This Encounter  Procedures  . Ambulatory referral to Urology    Referral Priority:   Routine    Referral Type:   Consultation    Referral Reason:   Specialty Services Required    Referred to Provider:   Hollice Espy, MD    Requested Specialty:   Urology    Number of Visits Requested:   1   All questions were answered. The patient knows to call the clinic with any problems, questions or concerns.      Cammie Sickle, MD 10/18/2017 5:04 PM

## 2017-10-18 NOTE — Assessment & Plan Note (Addendum)
#  Normocytic anemia-unclear etiology; worsened; today hemoglobin 7.7; patient symptomatic.  Proceed with PRBC transfusion 1 unit.  #Given the inconclusive-diagnosis even post bone marrow biopsy; discussed regarding NGS testing/foundation 1 heme.  Patient agrees.  #Bilateral hydro-nephrosis /hydroureter unclear etiology question bladder neck obstruction; new  creat 1.4/ refer back to [facility].   # Metastatic prostate cancer Hormone sensitive- biochemical recurrence [M0]-  [on Lupron 09/20/2017] -stable  # Prostatism symtoms-on flomax; worsened; refer to urology  #Follow up in approximately 8 weeks/labs hold tube;refer to urology.

## 2017-10-20 ENCOUNTER — Inpatient Hospital Stay: Payer: Medicare PPO

## 2017-10-20 ENCOUNTER — Ambulatory Visit (INDEPENDENT_AMBULATORY_CARE_PROVIDER_SITE_OTHER): Payer: 59 | Admitting: Urology

## 2017-10-20 ENCOUNTER — Encounter: Payer: Self-pay | Admitting: Urology

## 2017-10-20 VITALS — BP 143/57 | HR 64 | Ht 67.5 in | Wt 140.4 lb

## 2017-10-20 DIAGNOSIS — N1330 Unspecified hydronephrosis: Secondary | ICD-10-CM

## 2017-10-20 DIAGNOSIS — R350 Frequency of micturition: Secondary | ICD-10-CM | POA: Diagnosis not present

## 2017-10-20 DIAGNOSIS — C61 Malignant neoplasm of prostate: Secondary | ICD-10-CM

## 2017-10-20 DIAGNOSIS — N400 Enlarged prostate without lower urinary tract symptoms: Secondary | ICD-10-CM | POA: Diagnosis not present

## 2017-10-20 DIAGNOSIS — Z8546 Personal history of malignant neoplasm of prostate: Secondary | ICD-10-CM | POA: Diagnosis not present

## 2017-10-20 DIAGNOSIS — R339 Retention of urine, unspecified: Secondary | ICD-10-CM

## 2017-10-20 DIAGNOSIS — D649 Anemia, unspecified: Secondary | ICD-10-CM

## 2017-10-20 DIAGNOSIS — N134 Hydroureter: Secondary | ICD-10-CM | POA: Diagnosis not present

## 2017-10-20 DIAGNOSIS — Z79818 Long term (current) use of other agents affecting estrogen receptors and estrogen levels: Secondary | ICD-10-CM | POA: Diagnosis not present

## 2017-10-20 DIAGNOSIS — R3915 Urgency of urination: Secondary | ICD-10-CM | POA: Diagnosis not present

## 2017-10-20 DIAGNOSIS — Z923 Personal history of irradiation: Secondary | ICD-10-CM | POA: Diagnosis not present

## 2017-10-20 LAB — BLADDER SCAN AMB NON-IMAGING

## 2017-10-20 MED ORDER — ACETAMINOPHEN 325 MG PO TABS
650.0000 mg | ORAL_TABLET | Freq: Once | ORAL | Status: AC
  Administered 2017-10-20: 650 mg via ORAL
  Filled 2017-10-20: qty 2

## 2017-10-20 MED ORDER — SODIUM CHLORIDE 0.9 % IV SOLN
250.0000 mL | Freq: Once | INTRAVENOUS | Status: AC
  Administered 2017-10-20: 250 mL via INTRAVENOUS
  Filled 2017-10-20: qty 250

## 2017-10-20 MED ORDER — DIPHENHYDRAMINE HCL 25 MG PO CAPS
25.0000 mg | ORAL_CAPSULE | Freq: Once | ORAL | Status: AC
  Administered 2017-10-20: 25 mg via ORAL
  Filled 2017-10-20: qty 1

## 2017-10-20 NOTE — Progress Notes (Signed)
10/20/2017 1:52 PM   Thomas Lopez July 1933-03-14 [id number]  Referring provider: [name], MD [address] [place], [place] [address]  Chief Complaint  Patient presents with  . Hydronephrosis    HPI: 84 year old male referred for evaluation of bilateral hydronephrosis.  He has a history of prostate cancer and was treated with combination EBRT/brachytherapy in 2005 for moderate risk prostate cancer.  He had biochemical recurrence in November 2015. He was started on ADT in 2017.  PSA earlier this month was 1.77.    3 years ago he developed he states lower urinary tract symptoms including urinary hesitancy, decreased stream, frequency, nocturia x2 and urinary incontinence which is usually worse at night.  He does wear pads for urinary incontinence.  A CT scan performed on 09/08/2017 showed bilateral hydronephrosis and hydroureter to the level of the bladder.  He denies dysuria or gross hematuria.  He is on tamsulosin.   PMH: Past Medical History:  Diagnosis Date  . Aorto-iliac atherosclerosis ([place]) 04/2015   by CT scan  . Carotid stenosis 11/2014   mild bilateral 1-39%, rpt 2 yrs  . Colon polyps    rpt colonoscopy due 2014  . ED (erectile dysfunction)    Trimix and VED  . Esophagitis   . Essential hypertension 06/29/2009  . GERD (gastroesophageal reflux disease)   . Glaucoma    severe ([place])  . HLD (hyperlipidemia)   . HTN (hypertension)   . Hyperglycemia   . IBS (irritable bowel syndrome)   . Prostate cancer [facility]) 2005   s/p seed implant, EBRT (Dr. Alinda Money at [facility]) recurrent treating with androgen deprivation referred to cancer center  . Smoking history     Surgical History: Past Surgical History:  Procedure Laterality Date  . CATARACT EXTRACTION Left 05/2017   Dr. Edilia Bo  . CATARACT EXTRACTION, BILATERAL  December 2016   [place], Dr. Raynelle Fanning  . COLONOSCOPY  03/01/01   Multiple polyps, divertic//adenomatous polyps  . COLONOSCOPY   04/24/01   multiple polyps  . COLONOSCOPY  10/23/01   Polyps benign-repeat every 2 years  . COLONOSCOPY  06/23/04   A[place]om/hyperplastic polyps; multiple external hemorrhoids  . COLONOSCOPY  06/08/07   single small polyp-repeat 5 years  . ESOPHAGOGASTRODUODENOSCOPY  03/01/01   H.H.; gastritis esoph. dudodenitis  . NCS/Wrists Left Carpal  2/02   Min./right improved  . PFTs  10/2012   FVC 64%, FEV1 41%, ratio 0.62  . PROSTATE BIOPSY  12/04   positive; radioactive seed implant (Dr. Joelyn Oms)  . Prostate Ext Beam Radiation  1/27-07/23/03   Dr. Danny Lawless  . SPIROMETRY  09/2011   WNL  . Wrist Release  6/98; 9/99   Right    Home Medications:  Allergies as of 10/20/2017   No Known Allergies     Medication List        Accurate as of 10/20/17  1:52 PM. Always use your most recent med list.          aspirin EC 81 MG tablet Take 81 mg by mouth daily.   leuprolide 22.5 MG injection Commonly known as:  LUPRON Inject 22.5 mg into the muscle every 3 (three) months.   lisinopril 10 MG tablet Commonly known as:  PRINIVIL,ZESTRIL TAKE 1 TABLET EVERY DAY   methylcellulose packet Take 1 each by mouth as needed for constipation. Reported on 11/05/2015   omeprazole 40 MG capsule Commonly known as:  PRILOSEC Take 1 capsule (40 mg total) by mouth daily.  rosuvastatin 10 MG tablet Commonly known as:  CRESTOR Take 1 tablet (10 mg total) by mouth daily.   tamsulosin 0.4 MG Caps capsule Commonly known as:  FLOMAX Take 1 capsule (0.4 mg total) by mouth daily after supper.   timolol 0.5 % ophthalmic solution Commonly known as:  TIMOPTIC Place 1 drop into the left eye 2 (two) times daily.   venlafaxine XR 37.5 MG 24 hr capsule Commonly known as:  EFFEXOR-XR Take 1 capsule (37.5 mg total) by mouth daily with breakfast.       Allergies: No Known Allergies  Family History: Family History  Problem Relation Age of Onset  . Cancer Mother        colon  . Hyperlipidemia Mother   . Other  Mother        Carotid disease  . Coronary artery disease Neg Hx   . Stroke Neg Hx     Social History:  reports that he quit smoking about 24 years ago. He has never used smokeless tobacco. He reports that he drinks alcohol. He reports that he does not use drugs.  ROS: UROLOGY Frequent Urination?: Yes Hard to postpone urination?: No Burning/pain with urination?: No Get up at night to urinate?: Yes Leakage of urine?: No Urine stream starts and stops?: No Trouble starting stream?: No Do you have to strain to urinate?: No Blood in urine?: No Urinary tract infection?: No Sexually transmitted disease?: No Injury to kidneys or bladder?: No Painful intercourse?: No Weak stream?: No Erection problems?: Yes Penile pain?: No  Gastrointestinal Nausea?: No Vomiting?: No Indigestion/heartburn?: No Diarrhea?: No Constipation?: No  Constitutional Fever: No Night sweats?: No Weight loss?: Yes Fatigue?: No  Skin Skin rash/lesions?: No Itching?: No  Eyes Blurred vision?: No Double vision?: No  Ears/Nose/Throat Sore throat?: No Sinus problems?: No  Hematologic/Lymphatic Swollen glands?: No Easy bruising?: No  Cardiovascular Leg swelling?: No Chest pain?: No  Respiratory Cough?: Yes Shortness of breath?: No  Endocrine Excessive thirst?: No  Musculoskeletal Back pain?: No Joint pain?: No  Neurological Headaches?: No Dizziness?: No  Psychologic Depression?: No Anxiety?: No  Physical Exam: BP (!) 143/57 (BP Location: Right Arm, Patient Position: Sitting, Cuff Size: Normal)   Pulse 64   Ht 5' 7.5" (1.715 m)   Wt 140 lb 6.4 oz (63.7 kg)   BMI 21.67 kg/m    Constitutional:  Alert and oriented, No acute distress. HEENT: [place] AT, moist mucus membranes.  Trachea midline, no masses. Cardiovascular: No clubbing, cyanosis, or edema. Respiratory: Normal respiratory effort, no increased work of breathing. GI: Abdomen is soft, nontender, nondistended, no abdominal  masses GU: No CVA tenderness.  Prostate enlarged, 50 g, smooth without nodules Lymph: No cervical or inguinal lymphadenopathy. Skin: No rashes, bruises or suspicious lesions. Neurologic: Grossly intact, no focal deficits, moving all 4 extremities. Psychiatric: Normal mood and affect.  Laboratory Data: Lab Results  Component Value Date   WBC 6.9 10/18/2017   HGB 7.2 (L) 10/18/2017   HCT 22.4 (L) 10/18/2017   MCV 78.0 (L) 10/18/2017   PLT 451 (H) 10/18/2017    Lab Results  Component Value Date   CREATININE 1.53 (H) 10/11/2017    Lab Results  Component Value Date   PSA 1.77 10/11/2017   PSA 0.58 08/29/2016   PSA 0.60 06/02/2016    Lab Results  Component Value Date   HGBA1C 6.6 (H) 10/11/2017     Pertinent Imaging: CT was reviewed.  Assessment & Plan:    84 year old male with bilateral  hydronephrosis/hydroureter.  PVR by bladder scan today was 304 mL.  He has bothersome lower urinary tract symptoms.  I recommended Foley catheter placement with a follow-up ultrasound and creatinine in approximately 1 week and prior to catheter removal/voiding trial to see if his hydronephrosis has resolved.    Abbie Sons, [place] [address], [place], [place] [address] [phone]

## 2017-10-21 LAB — TYPE AND SCREEN
ABO/RH(D): A POS
ANTIBODY SCREEN: NEGATIVE
UNIT DIVISION: 0

## 2017-10-21 LAB — BPAM RBC
Blood Product Expiration Date: 201906032359
ISSUE DATE / TIME: 201905311015
Unit Type and Rh: 6200

## 2017-10-23 ENCOUNTER — Telehealth: Payer: Self-pay | Admitting: *Deleted

## 2017-10-23 NOTE — Telephone Encounter (Signed)
Wife came to c.ctr and requested to speak to Nira Conn, RN to discuss her husband's care. Wife reports that patient was very agitated with her on Thursday last week. She states that last Thursday "my husband called me on my cell phone. He was very irritable and accusing me of stealing all of his money and selling all of his possessions. This very bizarre. We have been married most of my life and he has never talked to me in this tone. On Saturday, he denied this conversation when I tried to ask him about this and He told me I didn't talk to him about any of this. He then told me that I must of talk to someone else about this conversation and that he would never accuse her of taking his money and his things. Honestly, I am just confused,  I don't know what his labs looked like. I am just concerned. He didn't want to do things most of this weekend. He must be cold all the time because he is wearing flannel pajamas bottoms to the bed in our 70 to 80 degree house. He normally wears shorts most of the summer season. I just find this behavior very odd. Plus, he refuses to let me get in the bed at the same time as he is getting in the bed. He tells me to sit in the living room for several hours without disturbing him before I come to bed after he lays down. He has never done this. He started this on Friday evening. He only had 2 appointments on Friday the cancer center for 1 unit of blood and he saw the urologist. I just need to know what went on his care. He will not tell me anything other than his apts went fine. I had my nephew take him to this apt. He wouldn't let my nephew go back there with him. When I asked my nephew how the apt went, my nephew said as far as I know he told me everything was fine and that he had a follow-up next week sometime. I asked Al when the next apt. He told me ' I don't know.' I asked him' What do you mean you don't know. So that's why I'm here. He doesn't know that I have come up here. I just  need some explanation to know what is going on with my husband's care."  Active listening provided. Reviewed pt's chart with wife. Patient had foley cath placed in urology office. I explained that documentation is written that pt went home with a foley. Pt's wife states that she was not "aware of this and I'm concerned why he didn't tell me any of this. This may explain why he didn't want me to see him and perhaps he has been hiding the foley cath from me." Wife states pt has not had any fevers or chills that she has noted. She will ask him open ended questions to see if he will tell her about the foley. She will try to come to him with the apt. We did discuss the rationale for the foley. She gave verbal understanding. She thanked me for my time. I spent approx. 30 mins face to face with wife today 12-1230pm listening and discussing her concerns.

## 2017-10-25 ENCOUNTER — Encounter: Payer: Self-pay | Admitting: Family Medicine

## 2017-10-25 ENCOUNTER — Ambulatory Visit (INDEPENDENT_AMBULATORY_CARE_PROVIDER_SITE_OTHER): Payer: Medicare PPO | Admitting: Family Medicine

## 2017-10-25 VITALS — BP 112/62 | HR 68 | Temp 97.9°F | Ht 67.5 in | Wt 138.5 lb

## 2017-10-25 DIAGNOSIS — K209 Esophagitis, unspecified without bleeding: Secondary | ICD-10-CM

## 2017-10-25 DIAGNOSIS — J439 Emphysema, unspecified: Secondary | ICD-10-CM

## 2017-10-25 DIAGNOSIS — E782 Mixed hyperlipidemia: Secondary | ICD-10-CM

## 2017-10-25 DIAGNOSIS — Z0000 Encounter for general adult medical examination without abnormal findings: Secondary | ICD-10-CM

## 2017-10-25 DIAGNOSIS — H401133 Primary open-angle glaucoma, bilateral, severe stage: Secondary | ICD-10-CM

## 2017-10-25 DIAGNOSIS — N320 Bladder-neck obstruction: Secondary | ICD-10-CM

## 2017-10-25 DIAGNOSIS — D649 Anemia, unspecified: Secondary | ICD-10-CM | POA: Diagnosis not present

## 2017-10-25 DIAGNOSIS — Z7189 Other specified counseling: Secondary | ICD-10-CM

## 2017-10-25 DIAGNOSIS — R195 Other fecal abnormalities: Secondary | ICD-10-CM

## 2017-10-25 DIAGNOSIS — R634 Abnormal weight loss: Secondary | ICD-10-CM

## 2017-10-25 DIAGNOSIS — I700 Atherosclerosis of aorta: Secondary | ICD-10-CM | POA: Diagnosis not present

## 2017-10-25 DIAGNOSIS — C61 Malignant neoplasm of prostate: Secondary | ICD-10-CM

## 2017-10-25 DIAGNOSIS — I708 Atherosclerosis of other arteries: Secondary | ICD-10-CM

## 2017-10-25 DIAGNOSIS — Z0001 Encounter for general adult medical examination with abnormal findings: Secondary | ICD-10-CM

## 2017-10-25 DIAGNOSIS — I10 Essential (primary) hypertension: Secondary | ICD-10-CM

## 2017-10-25 DIAGNOSIS — I70299 Other atherosclerosis of native arteries of extremities, unspecified extremity: Secondary | ICD-10-CM

## 2017-10-25 DIAGNOSIS — R7303 Prediabetes: Secondary | ICD-10-CM

## 2017-10-25 DIAGNOSIS — I2510 Atherosclerotic heart disease of native coronary artery without angina pectoris: Secondary | ICD-10-CM

## 2017-10-25 MED ORDER — ROSUVASTATIN CALCIUM 10 MG PO TABS: 10.0000 mg | ORAL_TABLET | Freq: Every day | ORAL | 3 refills | Status: DC

## 2017-10-25 MED ORDER — VENLAFAXINE HCL ER 37.5 MG PO CP24: 37.5000 mg | ORAL_CAPSULE | Freq: Every day | ORAL | 3 refills | Status: DC

## 2017-10-25 MED ORDER — LISINOPRIL 10 MG PO TABS: 10.0000 mg | ORAL_TABLET | Freq: Every day | ORAL | 3 refills | Status: DC

## 2017-10-25 MED ORDER — OMEPRAZOLE 40 MG PO CPDR: 40.0000 mg | DELAYED_RELEASE_CAPSULE | Freq: Every day | ORAL | 3 refills | Status: DC

## 2017-10-25 MED ORDER — FERROUS SULFATE 325 (65 FE) MG PO TABS
325.0000 mg | ORAL_TABLET | Freq: Every day | ORAL | 3 refills | Status: AC
Start: 2017-10-25 — End: ?

## 2017-10-25 MED ORDER — TAMSULOSIN HCL 0.4 MG PO CAPS: 0.4000 mg | ORAL_CAPSULE | Freq: Every day | ORAL | 3 refills | Status: DC

## 2017-10-25 MED ORDER — CALCIUM CARBONATE-VITAMIN D 600-400 MG-UNIT PO CHEW: 1.0000 | CHEWABLE_TABLET | Freq: Every day | ORAL | Status: DC

## 2017-10-25 NOTE — Patient Instructions (Addendum)
Medicines refilled.  Labs today Pass by lab for a stool kit.  We will be in touch with results. Keep follow up with Dr B and Dr Bernardo Heater.

## 2017-10-25 NOTE — Progress Notes (Signed)
BP 112/62 (BP Location: Left Arm, Patient Position: Sitting, Cuff Size: Normal)   Pulse 68   Temp 97.9 F (36.6 C) (Oral)   Ht 5' 7.5" (1.715 m)   Wt 138 lb 8 oz (62.8 kg)   SpO2 94%   BMI 21.37 kg/m    CC: CPE, f/u recent medical care Subjective:    Patient ID: Thomas Lopez, male    DOB: 07/25/1932, 84 y.o.   MRN: [id number]  HPI: NATION CRADLE is a 84 y.o. male presenting on 10/25/2017 for Annual Exam (Pt 2.)   I last saw patient 01/2017. Multiple medical appointments and interventions in the interim - reviewed.  Saw Katha Cabal last month for medicare wellness visit. Note reviewed.   Progressive anemia - unclear cause despite onc eval (bone marrow biopsy 02/2017 - mildly hypocellular ?low grade MDS, normal karyotype, normal FISH panel), stable contrasted CT chest /abd /pelvis except for newly found hydronephrosis. Referred to [facility] heme as well (Dr Gita Kudo). Latest [facility] note reviewed - consider ESR (TA) and EGD/colonoscopy. Had blood transfusion last week (has had several), has had iron infusions in the past (late 2018) without significant benefit. Ongoing unexplained weight loss noted over the past 6 months (156lbs 11/2016 --> 138 10/2017). Planned NGS testing/foundation 1 heme  Prostate cancer recurrence - previously followed by urology Dr Pilar Jarvis now oncology Dr Rogue Bussing on androgen deprivation therapy (ADT). Has established with Dr Bernardo Heater - see below.   New bilateral hydronephrosis/hydroureter - catheter placed Friday and f/u planned tomorrow [facility]).   Preventative: Prostate cancer 2005 s/p recurrence 2015 - see above H/o colon polyps, last colonoscopy 09/2012 by Dr. [name] - tortuous colon, unable to complete. Was told would have virtual colonoscopy scheduled but was never contacted. Advised him to call GI to f/u 2016. Normal yearly iFOBs in interim, last 11/2016. Dark stools started recently - but he has also been taking oral iron supplementation.  Flu shot yearly Pneumovax 2003  (done after age 16).Prevnar 2015.  Td 2008  zostavax 06/2010  Shingrix - deferred Advanced directives - has completed living will with attorney at home. Will bring [place] copy. Has youngest daughter designated as proxy for health decisions in case he cannot make them.  Seat belt use discussed Sunscreen use discussed. No changing moles on skin.  Ex smoker quit 1995 Alcohol - rare Dentist - unsure Eye exam - recent cataract surgery ([facility]). Just had eye drops stopped. Pending f/u next month.   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 Interim medical history since our last visit reviewed. Allergies and medications reviewed and updated. Outpatient Medications Prior to Visit  Medication Sig Dispense Refill  . aspirin EC 81 MG tablet Take 81 mg by mouth daily.    Marland Kitchen leuprolide (LUPRON) 22.5 MG injection Inject 22.5 mg into the muscle every 3 (three) months.    . methylcellulose packet Take 1 each by mouth as needed for constipation. Reported on 11/05/2015    . lisinopril (PRINIVIL,ZESTRIL) 10 MG tablet TAKE 1 TABLET EVERY DAY 90 tablet 2  . omeprazole (PRILOSEC) 40 MG capsule Take 1 capsule (40 mg total) by mouth daily. 90 capsule 3  . rosuvastatin (CRESTOR) 10 MG tablet Take 1 tablet (10 mg total) by mouth daily. 90 tablet 3  . tamsulosin (FLOMAX) 0.4  MG CAPS capsule Take 1 capsule (0.4 mg total) by mouth daily after supper. 90 capsule 3  . venlafaxine XR (EFFEXOR-XR) 37.5 MG 24 hr capsule Take 1 capsule (37.5 mg total) by mouth daily with breakfast. 90 capsule 3  . timolol (TIMOPTIC) 0.5 % ophthalmic solution Place 1 drop into the left eye 2 (two) times daily.      No facility-administered medications prior to visit.      Per HPI unless specifically  indicated in ROS section below Review of Systems  Constitutional: Positive for chills. Negative for activity change, appetite change, fatigue, fever and unexpected weight change.  HENT: Negative for hearing loss.   Eyes: Negative for visual disturbance.  Respiratory: Positive for cough (per family, pt denies). Negative for chest tightness, shortness of breath and wheezing.   Cardiovascular: Negative for chest pain, palpitations and leg swelling.  Gastrointestinal: Negative for abdominal distention, abdominal pain, blood in stool, constipation, diarrhea, nausea and vomiting.       Black stools endorsed  Endocrine: Positive for cold intolerance.  Genitourinary: Negative for difficulty urinating and hematuria.  Musculoskeletal: Negative for arthralgias, myalgias and neck pain.  Skin: Negative for rash.  Neurological: Negative for dizziness, seizures, syncope and headaches (no temporal pain).  Hematological: Negative for adenopathy. Does not bruise/bleed easily.  Psychiatric/Behavioral: Negative for dysphoric mood. The patient is not nervous/anxious.        Objective:    BP 112/62 (BP Location: Left Arm, Patient Position: Sitting, Cuff Size: Normal)   Pulse 68   Temp 97.9 F (36.6 C) (Oral)   Ht 5' 7.5" (1.715 m)   Wt 138 lb 8 oz (62.8 kg)   SpO2 94%   BMI 21.37 kg/m   Wt Readings from Last 3 Encounters:  10/26/17 140 lb 3.2 oz (63.6 kg)  10/25/17 138 lb 8 oz (62.8 kg)  10/20/17 140 lb 6.4 oz (63.7 kg)  02/17/2017 - weight 148 lbs 08/16/2016 - weight 153 lbs Physical Exam  Constitutional: He is oriented to person, place, and time. He appears well-developed and well-nourished. No distress.  thin  HENT:  Head: Normocephalic and atraumatic.  Right Ear: Hearing, tympanic membrane, external ear and ear canal normal.  Left Ear: Hearing, tympanic membrane, external ear and ear canal normal.  Nose: Nose normal.  Mouth/Throat: Uvula is midline, oropharynx is clear and moist and mucous  membranes are normal. No oropharyngeal exudate, posterior oropharyngeal edema or posterior oropharyngeal erythema.  Eyes: Pupils are equal, round, and reactive to light. Conjunctivae and EOM are normal. No scleral icterus.  Neck: Normal range of motion. Neck supple. No thyromegaly present.  Cardiovascular: Normal rate, regular rhythm, normal heart sounds and intact distal pulses.  No murmur heard. Pulses:      Radial pulses are 2+ on the right side, and 2+ on the left side.  Pulmonary/Chest: Effort normal and breath sounds normal. No respiratory distress. He has no wheezes. He has no rales.  Abdominal: Soft. Bowel sounds are normal. He exhibits no distension and no mass. There is no tenderness. There is no rebound and no guarding.  Genitourinary:  Genitourinary Comments: Foley in place  Musculoskeletal: Normal range of motion. He exhibits no edema.  Lymphadenopathy:    He has no cervical adenopathy.  Neurological: He is alert and oriented to person, place, and time.  CN grossly intact, station and gait intact  Skin: Skin is warm and dry. No rash noted.  Psychiatric: He has a normal mood and affect. His behavior is normal. Judgment  and thought content normal.  Nursing note and vitals reviewed.  Results for orders placed or performed in visit on 10/25/17  Sedimentation rate  Result Value Ref Range   Sed Rate 95 (H) 0 - 20 mm/hr  TSH  Result Value Ref Range   TSH 0.99 0.35 - 4.50 uIU/mL  T4, free  Result Value Ref Range   Free T4 0.77 0.60 - 1.60 ng/dL  Serum protein electrophoresis with reflex  Result Value Ref Range   Total Protein 7.5 6.1 - 8.1 g/dL   Albumin ELP 3.2 (L) 3.8 - 4.8 g/dL   Alpha 1 0.5 (H) 0.2 - 0.3 g/dL   Alpha 2 1.1 (H) 0.5 - 0.9 g/dL   Beta Globulin 0.4 0.4 - 0.6 g/dL   Beta 2 0.5 0.2 - 0.5 g/dL   Gamma Globulin 1.8 (H) 0.8 - 1.7 g/dL   Abnormal Protein Band1 NOTE NONE DETEC g/dL   SPE Interp.      Lab Results  Component Value Date   TSH 0.99 10/25/2017     Lab Results  Component Value Date   ALT 11 (L) 09/20/2017   AST 15 09/20/2017   ALKPHOS 62 09/20/2017   BILITOT 0.4 09/20/2017   Lab Results  Component Value Date   CREATININE 1.47 (H) 10/26/2017   BUN 52 (H) 10/11/2017   NA 141 10/11/2017   K 4.1 10/11/2017   CL 106 10/11/2017   CO2 22 10/11/2017    Lab Results  Component Value Date   CHOL 114 10/11/2017   HDL 34.80 (L) 10/11/2017   LDLCALC 55 10/11/2017   TRIG 119.0 10/11/2017   CHOLHDL 3 10/11/2017    Lab Results  Component Value Date   HGBA1C 6.6 (H) 10/11/2017       Assessment & Plan:   Problem List Items Addressed This Visit    Primary open angle glaucoma    S/p eye surgery ([facility]) now off all eye drops and doing well.       Primary adenocarcinoma of prostate ([place])    Biochemical recurrence on ADT Rogue Bussing)      Relevant Medications   venlafaxine XR (EFFEXOR-XR) 37.5 MG 24 hr capsule   Prediabetes    A1c actually worse this year - continue to monitor.       Normocytic anemia    Appreciate heme/onc care. Unclear cause after extensive evaluation including bone marrow biopsy 02/2017, FISH study normal. Iron studies not clear-cut iron deficiency (low transferrin). Also noted high ferritin consistent with inflammatory state. Has received multiple iron infusions and blood transfusions.  Endorsing dark stools - but patient is on oral iron replacement which could account for this. Will send home with iFOB, consider GI referral.  ?lupron effect.       Relevant Medications   ferrous sulfate 325 (65 FE) MG tablet   Mixed hyperlipidemia    Chronic, stable on crestor. Consider tapering off given malaise and marked weight loss.  The ASCVD Risk score Mikey Bussing DC Jr., et al., 2013) failed to calculate for the following reasons:   The 2013 ASCVD risk score is only valid for ages 69 to 44       Relevant Medications   lisinopril (PRINIVIL,ZESTRIL) 10 MG tablet   rosuvastatin (CRESTOR) 10 MG tablet   Health  maintenance examination - Primary    Preventative protocols reviewed and updated unless pt declined. Discussed healthy diet and lifestyle.       Essential hypertension    Chronic on lisinopril 31m daily -  continue.       Relevant Medications   lisinopril (PRINIVIL,ZESTRIL) 10 MG tablet   rosuvastatin (CRESTOR) 10 MG tablet   ESOPHAGITIS    H/o this - now on omeprazole 64m daily long term.       Emphysema of lung (HCC)   CAD (coronary artery disease)   Relevant Medications   lisinopril (PRINIVIL,ZESTRIL) 10 MG tablet   rosuvastatin (CRESTOR) 10 MG tablet   Bladder outlet obstruction    Foley in place, planned voiding trial at end of the week ([facility] Unclear etiology at this time.       Aorto-iliac atherosclerosis (H[facility])   Relevant Medications   lisinopril (PRINIVIL,ZESTRIL) 10 MG tablet   rosuvastatin (CRESTOR) 10 MG tablet   Advanced care planning/counseling discussion    Advanced directives - has completed living will with attorney at home. Will bring [place]. Has youngest daughter designated as proxy for health decisions in case he cannot make them.       Abnormal weight loss    With normocytic anemia, malaise/fatigue unclear etiology after bone marrow biopsy.  ?lupron effect ?related to biochemically recurrent prostate cancer Per [facility] heme eval, ?[facility] - but he denies temporal headache, vision changes. Check ESR today.  With anemia, CKD, check SPEP for MM.  New BOO s/p foley placement.  I have asked him to return in 1 month for f/u.       Relevant Orders   Sedimentation rate (Completed)   TSH (Completed)   T4, free (Completed)   Serum protein electrophoresis with reflex (Completed)       Meds ordered this encounter  Medications  . venlafaxine XR (EFFEXOR-XR) 37.5 MG 24 hr capsule    Sig: Take 1 capsule (37.5 mg total) by mouth daily with breakfast.    Dispense:  90 capsule    Refill:  3  . lisinopril (PRINIVIL,ZESTRIL) 10 MG tablet    Sig: Take 1 tablet  (10 mg total) by mouth daily.    Dispense:  90 tablet    Refill:  3  . omeprazole (PRILOSEC) 40 MG capsule    Sig: Take 1 capsule (40 mg total) by mouth daily.    Dispense:  90 capsule    Refill:  3  . rosuvastatin (CRESTOR) 10 MG tablet    Sig: Take 1 tablet (10 mg total) by mouth daily.    Dispense:  90 tablet    Refill:  3  . tamsulosin (FLOMAX) 0.4 MG CAPS capsule    Sig: Take 1 capsule (0.4 mg total) by mouth daily after supper.    Dispense:  90 capsule    Refill:  3  . ferrous sulfate 325 (65 FE) MG tablet    Sig: Take 1 tablet (325 mg total) by mouth daily with breakfast.    Refill:  3  . Calcium Carbonate-Vitamin D (CALCIUM 600/VITAMIN D) 600-400 MG-UNIT chew tablet    Sig: Chew 1 tablet by mouth daily.   Orders Placed This Encounter  Procedures  . Sedimentation rate  . TSH  . T4, free  . Serum protein electrophoresis with reflex  . IFE Interpretation    Follow up plan: Return in about 1 month (around 11/22/2017) for follow up visit.  JRia Bush MD

## 2017-10-26 ENCOUNTER — Ambulatory Visit: Payer: 59

## 2017-10-26 VITALS — BP 119/65 | HR 101 | Resp 16 | Ht 69.0 in | Wt 140.2 lb

## 2017-10-26 DIAGNOSIS — N1330 Unspecified hydronephrosis: Secondary | ICD-10-CM

## 2017-10-26 LAB — SEDIMENTATION RATE: Sed Rate: 95 mm/hr — ABNORMAL HIGH (ref 0–20)

## 2017-10-26 LAB — TSH: TSH: 0.99 u[IU]/mL (ref 0.35–4.50)

## 2017-10-26 LAB — T4, FREE: FREE T4: 0.77 ng/dL (ref 0.60–1.60)

## 2017-10-26 NOTE — Progress Notes (Signed)
Catheter Removal  Patient is present today for a catheter removal.  14ml of water was drained from the balloon. A 16FR Coude foley cath was removed from the bladder no complications were noted . Patient tolerated well.  Preformed by: Cristie Hem, CMA  Follow up/ Additional notes: Return this afternoon if unable to urinate.   Blood pressure 119/65, pulse (!) 101, resp. rate 16, height 5\' 9"  (1.753 m), weight 140 lb 3.2 oz (63.6 kg), SpO2 96 %.

## 2017-10-27 LAB — CREATININE, SERUM
CREATININE: 1.47 mg/dL — AB (ref 0.76–1.27)
GFR calc Af Amer: 51 mL/min/{1.73_m2} — ABNORMAL LOW (ref >59)
GFR, EST NON AFRICAN AMERICAN: 44 mL/min/{1.73_m2} — AB (ref >59)

## 2017-10-28 ENCOUNTER — Encounter: Payer: Self-pay | Admitting: Family Medicine

## 2017-10-28 DIAGNOSIS — R339 Retention of urine, unspecified: Secondary | ICD-10-CM | POA: Insufficient documentation

## 2017-10-28 DIAGNOSIS — I2510 Atherosclerotic heart disease of native coronary artery without angina pectoris: Secondary | ICD-10-CM | POA: Insufficient documentation

## 2017-10-28 DIAGNOSIS — J439 Emphysema, unspecified: Secondary | ICD-10-CM | POA: Insufficient documentation

## 2017-10-28 NOTE — Assessment & Plan Note (Signed)
Biochemical recurrence on ADT Rogue Bussing)

## 2017-10-28 NOTE — Assessment & Plan Note (Signed)
Preventative protocols reviewed and updated unless pt declined. Discussed healthy diet and lifestyle.  

## 2017-10-28 NOTE — Assessment & Plan Note (Addendum)
Appreciate heme/onc care. Unclear cause after extensive evaluation including bone marrow biopsy 02/2017, FISH study normal. Iron studies not clear-cut iron deficiency (low transferrin). Also noted high ferritin consistent with inflammatory state. Has received multiple iron infusions and blood transfusions.  Endorsing dark stools - but patient is on oral iron replacement which could account for this. Will send home with iFOB, consider GI referral.  ?lupron effect.  

## 2017-10-28 NOTE — Assessment & Plan Note (Signed)
Foley in place, planned voiding trial at end of the week [facility]) Unclear etiology at this time.

## 2017-10-28 NOTE — Addendum Note (Signed)
Addended by: [name] on: 10/28/2017 12:05 PM   Modules accepted: Orders

## 2017-10-28 NOTE — Assessment & Plan Note (Signed)
Advanced directives - has completed living will with attorney at home. Will bring [place] copy. Has youngest daughter designated as proxy for health decisions in case he cannot make them.

## 2017-10-28 NOTE — Assessment & Plan Note (Signed)
Chronic on lisinopril 10mg  daily - continue.

## 2017-10-28 NOTE — Assessment & Plan Note (Signed)
H/o this - now on omeprazole 40mg  daily long term.

## 2017-10-28 NOTE — Assessment & Plan Note (Signed)
S/p eye surgery ([place]) now off all eye drops and doing well.

## 2017-10-28 NOTE — Assessment & Plan Note (Signed)
A1c actually worse this year - continue to monitor.

## 2017-10-28 NOTE — Assessment & Plan Note (Addendum)
With normocytic anemia, malaise/fatigue unclear etiology after bone marrow biopsy.  ?lupron effect ?related to biochemically recurrent prostate cancer Per [facility] heme eval, ?[facility] - but he denies temporal headache, vision changes. Check ESR today.  With anemia, CKD, check SPEP for MM.  New BOO s/p foley placement.  I have asked him to return in 1 month for f/u.

## 2017-10-28 NOTE — Assessment & Plan Note (Signed)
Chronic, stable on crestor. Consider tapering off given malaise and marked weight loss.  The ASCVD Risk score Thomas Bussing DC Jr., et al., 2013) failed to calculate for the following reasons:   The 2013 ASCVD risk score is only valid for ages 75 to 27

## 2017-10-29 ENCOUNTER — Encounter: Payer: Self-pay | Admitting: Emergency Medicine

## 2017-10-29 ENCOUNTER — Emergency Department
Admission: EM | Admit: 2017-10-29 | Discharge: 2017-10-29 | Disposition: A | Discharge status: Home / Self Care | Payer: Medicare PPO | Attending: Emergency Medicine | Admitting: Emergency Medicine

## 2017-10-29 ENCOUNTER — Other Ambulatory Visit: Payer: Self-pay

## 2017-10-29 ENCOUNTER — Emergency Department: Payer: Medicare PPO

## 2017-10-29 DIAGNOSIS — Z87891 Personal history of nicotine dependence: Secondary | ICD-10-CM | POA: Insufficient documentation

## 2017-10-29 DIAGNOSIS — E785 Hyperlipidemia, unspecified: Secondary | ICD-10-CM | POA: Insufficient documentation

## 2017-10-29 DIAGNOSIS — I10 Essential (primary) hypertension: Secondary | ICD-10-CM | POA: Diagnosis not present

## 2017-10-29 DIAGNOSIS — M619 Calcification and ossification of muscle, unspecified: Secondary | ICD-10-CM | POA: Diagnosis not present

## 2017-10-29 DIAGNOSIS — I700 Atherosclerosis of aorta: Secondary | ICD-10-CM | POA: Diagnosis not present

## 2017-10-29 DIAGNOSIS — M25561 Pain in right knee: Secondary | ICD-10-CM | POA: Diagnosis not present

## 2017-10-29 DIAGNOSIS — Z8546 Personal history of malignant neoplasm of prostate: Secondary | ICD-10-CM | POA: Diagnosis not present

## 2017-10-29 DIAGNOSIS — Z7982 Long term (current) use of aspirin: Secondary | ICD-10-CM | POA: Insufficient documentation

## 2017-10-29 DIAGNOSIS — Z79899 Other long term (current) drug therapy: Secondary | ICD-10-CM | POA: Insufficient documentation

## 2017-10-29 DIAGNOSIS — I2510 Atherosclerotic heart disease of native coronary artery without angina pectoris: Secondary | ICD-10-CM | POA: Diagnosis not present

## 2017-10-29 DIAGNOSIS — I998 Other disorder of circulatory system: Secondary | ICD-10-CM

## 2017-10-29 MED ORDER — DICLOFENAC SODIUM 1 % TD GEL: 2.0000 g | Freq: Every day | TRANSDERMAL | 0 refills | Status: AC

## 2017-10-29 NOTE — ED Notes (Signed)
NAD noted at time of D/C. Pt taken to lobby via wheelchair at this time. Pt denies comments/concerns regarding D/C.  

## 2017-10-29 NOTE — ED Provider Notes (Signed)
[facility] Emergency Department Provider Note  ____________________________________________  Time seen: Approximately 10:18 AM  I have reviewed the triage vital signs and the nursing notes.   HISTORY  Chief Complaint Knee Injury    HPI Thomas Lopez is a 84 y.o. male that presents to the emergency department for evaluation of right knee pain for 3 days.  Patient was outside chopping wood with his son when a piece of a log hit his knee.  Pain with ambulation has increased since. Pain is worse with weight bearing. Pain is over the front of his knee.  He is not having any pain in the back of his knee or lower leg.  No numbness or tingling of feet.   Past Medical History:  Diagnosis Date  . Aorto-iliac atherosclerosis ([place]) 04/2015   by CT scan  . Carotid stenosis 11/2014   mild bilateral 1-39%, rpt 2 yrs  . Colon polyps    rpt colonoscopy due 2014  . ED (erectile dysfunction)    Trimix and VED  . Esophagitis   . Essential hypertension 06/29/2009  . GERD (gastroesophageal reflux disease)   . Glaucoma    severe ([place])  . HLD (hyperlipidemia)   . HTN (hypertension)   . Hyperglycemia   . IBS (irritable bowel syndrome)   . Prostate cancer [facility]) 2005   s/p seed implant, EBRT (Dr. Alinda Money at [facility]) recurrent treating with androgen deprivation referred to cancer center  . Smoking history     Patient Active Problem List   Diagnosis Date Noted  . CAD (coronary artery disease) 10/28/2017  . Emphysema of lung ([place]) 10/28/2017  . Bladder outlet obstruction 10/28/2017  . Abnormal weight loss 08/30/2017  . Dyspnea on exertion 03/01/2017  . Normocytic anemia 03/01/2017  . Hot flashes 02/12/2016  . Right shoulder injury 08/18/2015  . Pseudoaphakia 07/31/2015  . Aorto-iliac atherosclerosis ([place]) 04/23/2015  . Health maintenance examination 08/13/2014  . Advanced care planning/counseling discussion 08/13/2014  . Memory impairment 08/13/2014  . Carotid  stenosis 08/09/2013  . Presbyopia 12/31/2012  . Primary open angle glaucoma 12/15/2012  . History of smoking 09/29/2011  . Medicare annual wellness visit, subsequent 07/15/2011  . Primary adenocarcinoma of prostate ([place])   . Cataract, nuclear sclerotic senile 05/01/2011  . Essential hypertension 06/29/2009  . IRRITABLE BOWEL SYNDROME 04/08/2008  . History of colonic polyps 06/08/2007  . Prediabetes 06/05/2007  . ESOPHAGITIS 06/05/2007  . 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 Mixed hyperlipidemia 06/04/2007    Past Surgical History:  Procedure Laterality Date  . CATARACT EXTRACTION Left 05/2017   Dr. Edilia Bo  . CATARACT EXTRACTION, BILATERAL  December 2016   [place], Dr. Raynelle Fanning  . COLONOSCOPY  03/01/01   Multiple polyps, divertic//adenomatous polyps  . COLONOSCOPY  04/24/01   multiple polyps  . COLONOSCOPY  10/23/01   Polyps benign-repeat every 2 years  . COLONOSCOPY  06/23/04   Adenom/hyperplastic polyps; multiple external hemorrhoids  . COLONOSCOPY  06/08/07   single small polyp-repeat 5 years  . ESOPHAGOGASTRODUODENOSCOPY  03/01/01   H.H.; gastritis esoph. dudodenitis  . NCS/Wrists Left Carpal  2/02   Min./right improved  . PFTs  10/2012   FVC 64%, FEV1 41%, ratio 0.62  . PROSTATE BIOPSY  12/04   positive; radioactive seed implant (Dr. Joelyn Oms)  . Prostate Ext Beam Radiation  1/27-07/23/03   Dr. Danny Lawless  . SPIROMETRY  09/2011   WNL  . Wrist Release  6/98; 9/99   Right  Prior to Admission medications   Medication Sig Start Date End Date Taking? Authorizing Provider  aspirin EC 81 MG tablet Take 81 mg by mouth daily.    [provider]  Calcium Carbonate-Vitamin D (CALCIUM 600/VITAMIN D) 600-400 MG-UNIT chew tablet Chew 1 tablet by mouth daily. 10/25/17   [name], MD  diclofenac sodium (VOLTAREN) 1 % GEL Apply 2 g topically daily for 7 days. 10/29/17 11/05/17  Laban Emperor, PA-C  ferrous sulfate 325 (65 FE) MG tablet Take 1 tablet  (325 mg total) by mouth daily with breakfast. 10/25/17   [name], MD  leuprolide (LUPRON) 22.5 MG injection Inject 22.5 mg into the muscle every 3 (three) months.    [provider]  lisinopril (PRINIVIL,ZESTRIL) 10 MG tablet Take 1 tablet (10 mg total) by mouth daily. 10/25/17   [name], MD  methylcellulose packet Take 1 each by mouth as needed for constipation. Reported on 11/05/2015    [provider]  omeprazole (PRILOSEC) 40 MG capsule Take 1 capsule (40 mg total) by mouth daily. 10/25/17   [name], MD  rosuvastatin (CRESTOR) 10 MG tablet Take 1 tablet (10 mg total) by mouth daily. 10/25/17   [name], MD  tamsulosin (FLOMAX) 0.4 MG CAPS capsule Take 1 capsule (0.4 mg total) by mouth daily after supper. 10/25/17   [name], MD  venlafaxine XR (EFFEXOR-XR) 37.5 MG 24 hr capsule Take 1 capsule (37.5 mg total) by mouth daily with breakfast. 10/25/17   [name], MD    Allergies Patient has no known allergies.  Family History  Problem Relation Age of Onset  . Cancer Mother        colon  . Hyperlipidemia Mother   . Other Mother        Carotid disease  . Coronary artery disease Neg Hx   . Stroke Neg Hx     Social History Social History   Tobacco Use  . Smoking status: Former Smoker    Last attempt to quit: 05/23/1993    Years since quitting: 24.4  . Smokeless tobacco: Never Used  Substance Use Topics  . Alcohol use: Yes    Comment: Rare  . Drug use: No     Review of Systems  Gastrointestinal: No nausea, no vomiting.  Musculoskeletal: Positive for knee pain.  Skin: Negative for rash, abrasions, lacerations, ecchymosis. Neurological: Negative for numbness or tingling   ____________________________________________   PHYSICAL EXAM:  VITAL SIGNS: ED Triage Vitals  Enc Vitals Group     BP 10/29/17 0858 (!) 127/105     Pulse Rate 10/29/17 0858 89     Resp 10/29/17 0858 16     Temp 10/29/17 0858 97.7 F (36.5  C)     Temp Source 10/29/17 0858 Oral     SpO2 10/29/17 0858 100 %     Weight 10/29/17 0858 147 lb (66.7 kg)     Height 10/29/17 0858 5\' 8"  (1.727 m)     Head Circumference --      Peak Flow --      Pain Score 10/29/17 0906 10     Pain Loc --      Pain Edu? --      Excl. in [place]? --      Constitutional: Alert and oriented. Well appearing and in no acute distress. Eyes: Conjunctivae are normal. PERRL. EOMI. Head: Atraumatic. ENT:      Ears:      Nose: No congestion/rhinnorhea.      Mouth/Throat:  Mucous membranes are moist.  Neck: No stridor.   Cardiovascular: Normal rate, regular rhythm.  Good peripheral circulation. Respiratory: Normal respiratory effort without tachypnea or retractions. Lungs CTAB. Good air entry to the bases with no decreased or absent breath sounds. Musculoskeletal: Full range of motion to all extremities. No gross deformities appreciated. Able to actively flex and extend knee. Tenderness to palpation over superior patella. No tenderness to palpation of popliteal fossa, or lower leg. No warmth or visible swelling.  Neurologic:  Normal speech and language. No gross focal neurologic deficits are appreciated.  Skin:  Skin is warm, dry and intact. No rash noted.  ____________________________________________   LABS (all labs ordered are listed, but only abnormal results are displayed)  Labs Reviewed - No data to display ____________________________________________  EKG   ____________________________________________  RADIOLOGY Robinette Haines, personally viewed and evaluated these images (plain radiographs) as part of my medical decision making, as well as reviewing the written report by the radiologist.  Dg Knee Complete 4 Views Right  Result Date: 10/29/2017 CLINICAL DATA:  Pain and limited range of motion EXAM: RIGHT KNEE - COMPLETE 4+ VIEW COMPARISON:  None. FINDINGS: Frontal, lateral common bile oblique views were obtained. There is no appreciable  fracture or dislocation. No appreciable joint effusion. Joint spaces appear unremarkable. There is spurring along the anterior patella superiorly and inferiorly. No erosive changes. There is extensive arterial vascular calcification. IMPRESSION: Spurring along the patella anteriorly, likely due to distal quadriceps and proximal patellar tendinosis. No appreciable joint space narrowing or joint effusion. No fracture or dislocation. Multivessel atherosclerotic calcification noted. Electronically Signed   By: Lowella Grip III M.D.   On: 10/29/2017 09:58    ____________________________________________    PROCEDURES  Procedure(s) performed:    Procedures    Medications - No data to display   ____________________________________________   INITIAL IMPRESSION / ASSESSMENT AND PLAN / ED COURSE  Pertinent labs & imaging results that were available during my care of the patient were reviewed by me and considered in my medical decision making (see chart for details).  Review of the [place] CSRS was performed in accordance of the [place] prior to dispensing any controlled drugs.   Patient presented to the emergency department for evaluation of knee pain after injury for 3 days.  Vital signs and exam are reassuring.  X-ray consistent with chronic changes. These results were discussed with patient.  Knee was ace wrapped.  Gilford Rile was given.  Patient will be discharged home with prescriptions for voltaren gel. Patient is to follow up with PCP as directed. He is agreeable to call PCP tomorrow for an appointment. Patient is given ED precautions to return to the ED for any worsening or new symptoms.     ____________________________________________  FINAL CLINICAL IMPRESSION(S) / ED DIAGNOSES  Final diagnoses:  Acute pain of right knee  Vascular calcification      NEW MEDICATIONS STARTED DURING THIS VISIT:  ED Discharge Orders        Ordered    Knee brace     10/29/17 1050    diclofenac  sodium (VOLTAREN) 1 % GEL  Daily     10/29/17 1051          This chart was dictated using voice recognition software/Dragon. Despite best efforts to proofread, errors can occur which can change the meaning. Any change was purely unintentional.    Laban Emperor, PA-C 10/29/17 1748    Nance Pear, MD 10/30/17 [phone]

## 2017-10-29 NOTE — ED Triage Notes (Signed)
Injured right knee on Thursday while out cutting wood.  Describes getting knee stuck between two pieces of wood.  States pain and swelling has worsened and is now difficult to walk.

## 2017-10-31 LAB — IFE INTERPRETATION

## 2017-10-31 LAB — PROTEIN ELECTROPHORESIS, SERUM, WITH REFLEX
ALPHA 1: 0.5 g/dL — AB (ref 0.2–0.3)
Albumin ELP: 3.2 g/dL — ABNORMAL LOW (ref 3.8–4.8)
Alpha 2: 1.1 g/dL — ABNORMAL HIGH (ref 0.5–0.9)
Beta 2: 0.5 g/dL (ref 0.2–0.5)
Beta Globulin: 0.4 g/dL (ref 0.4–0.6)
Gamma Globulin: 1.8 g/dL — ABNORMAL HIGH (ref 0.8–1.7)
TOTAL PROTEIN: 7.5 g/dL (ref 6.1–8.1)

## 2017-11-03 ENCOUNTER — Encounter: Payer: Self-pay | Admitting: Internal Medicine

## 2017-11-07 ENCOUNTER — Ambulatory Visit
Admission: RE | Admit: 2017-11-07 | Discharge: 2017-11-07 | Disposition: A | Discharge status: Home / Self Care | Payer: Medicare PPO | Source: Ambulatory Visit | Attending: Urology | Admitting: Urology

## 2017-11-07 DIAGNOSIS — N1330 Unspecified hydronephrosis: Secondary | ICD-10-CM | POA: Diagnosis not present

## 2017-11-07 DIAGNOSIS — N3289 Other specified disorders of bladder: Secondary | ICD-10-CM | POA: Insufficient documentation

## 2017-11-08 ENCOUNTER — Telehealth: Payer: Self-pay

## 2017-11-08 ENCOUNTER — Ambulatory Visit (INDEPENDENT_AMBULATORY_CARE_PROVIDER_SITE_OTHER): Payer: Medicare PPO

## 2017-11-08 VITALS — BP 85/53 | HR 105 | Resp 16 | Ht 69.0 in

## 2017-11-08 DIAGNOSIS — R339 Retention of urine, unspecified: Secondary | ICD-10-CM

## 2017-11-08 LAB — BLADDER SCAN AMB NON-IMAGING

## 2017-11-08 NOTE — Telephone Encounter (Signed)
Pt informed, will have pvr today

## 2017-11-08 NOTE — Progress Notes (Signed)
Simple Catheter Placement  Due to urinary retention patient is present today for a foley cath placement.  Patient was cleaned and prepped in a sterile fashion with betadine and lidocaine jelly 2% was instilled into the urethra.  A 16 FR foley catheter was inserted, urine return was noted  432ml, urine was yellow in color.  The balloon was filled with 10cc of sterile water.  A leg bag was attached for drainage. Patient was also given a night bag to take home and was given instruction on how to change from one bag to another.  Patient was given instruction on proper catheter care.  Patient tolerated well, no complications were noted   Preformed by: Cristie Hem, CMA  Additional notes/ Follow up: Ultrasound and followup

## 2017-11-08 NOTE — Telephone Encounter (Signed)
-----   Message from Abbie Sons, MD sent at 11/08/2017  7:38 AM EDT ----- He was supposed to have the ultrasound performed while his Foley catheter was still in place.  His Foley catheter was removed on 6/6 and the ultrasound was performed almost 2 weeks after the catheter was removed.  It still shows bilateral hydronephrosis and bladder distention.  He needs to come back in for a nurse visit with PVR and if a significant residual the catheter needs to be replaced and he needs a repeat ultrasound while the catheter is indwelling.

## 2017-11-09 ENCOUNTER — Telehealth: Payer: Self-pay | Admitting: Urology

## 2017-11-09 NOTE — Telephone Encounter (Signed)
Patient has been scheduled  Thomas Lopez

## 2017-11-09 NOTE — Telephone Encounter (Signed)
Pt came in and had his pvr, also reinserted catheter. Please arrange for pt to have ultrasound with catheter inserted. Pt can come and have catheter removed afterwards.

## 2017-11-17 ENCOUNTER — Ambulatory Visit
Admission: RE | Admit: 2017-11-17 | Discharge: 2017-11-17 | Disposition: A | Discharge status: Home / Self Care | Payer: Medicare PPO | Source: Ambulatory Visit | Attending: Urology | Admitting: Urology

## 2017-11-17 DIAGNOSIS — R339 Retention of urine, unspecified: Secondary | ICD-10-CM | POA: Insufficient documentation

## 2017-11-17 DIAGNOSIS — N1330 Unspecified hydronephrosis: Secondary | ICD-10-CM | POA: Insufficient documentation

## 2017-11-17 DIAGNOSIS — N132 Hydronephrosis with renal and ureteral calculous obstruction: Secondary | ICD-10-CM | POA: Diagnosis not present

## 2017-11-21 ENCOUNTER — Other Ambulatory Visit (INDEPENDENT_AMBULATORY_CARE_PROVIDER_SITE_OTHER): Payer: Medicare PPO

## 2017-11-21 ENCOUNTER — Telehealth: Payer: Self-pay

## 2017-11-21 DIAGNOSIS — R195 Other fecal abnormalities: Secondary | ICD-10-CM | POA: Diagnosis not present

## 2017-11-21 LAB — FECAL OCCULT BLOOD, IMMUNOCHEMICAL: FECAL OCCULT BLD: NEGATIVE

## 2017-11-21 NOTE — Telephone Encounter (Signed)
-----   Message from Abbie Sons, MD sent at 11/21/2017 11:31 AM EDT ----- Renal ultrasound shows persistent hydronephrosis.  Needs follow-up visit to discuss management options.

## 2017-11-24 ENCOUNTER — Other Ambulatory Visit: Payer: Self-pay

## 2017-11-24 ENCOUNTER — Emergency Department: Payer: Medicare PPO

## 2017-11-24 ENCOUNTER — Encounter: Payer: Self-pay | Admitting: Emergency Medicine

## 2017-11-24 ENCOUNTER — Emergency Department
Admission: EM | Admit: 2017-11-24 | Discharge: 2017-11-24 | Disposition: A | Discharge status: Home / Self Care | Payer: Medicare PPO | Attending: Emergency Medicine | Admitting: Emergency Medicine

## 2017-11-24 DIAGNOSIS — Y999 Unspecified external cause status: Secondary | ICD-10-CM | POA: Diagnosis not present

## 2017-11-24 DIAGNOSIS — R55 Syncope and collapse: Secondary | ICD-10-CM

## 2017-11-24 DIAGNOSIS — Y939 Activity, unspecified: Secondary | ICD-10-CM | POA: Diagnosis not present

## 2017-11-24 DIAGNOSIS — Z7982 Long term (current) use of aspirin: Secondary | ICD-10-CM | POA: Diagnosis not present

## 2017-11-24 DIAGNOSIS — I10 Essential (primary) hypertension: Secondary | ICD-10-CM | POA: Insufficient documentation

## 2017-11-24 DIAGNOSIS — W01190A Fall on same level from slipping, tripping and stumbling with subsequent striking against furniture, initial encounter: Secondary | ICD-10-CM | POA: Diagnosis not present

## 2017-11-24 DIAGNOSIS — N3000 Acute cystitis without hematuria: Secondary | ICD-10-CM

## 2017-11-24 DIAGNOSIS — I2510 Atherosclerotic heart disease of native coronary artery without angina pectoris: Secondary | ICD-10-CM | POA: Insufficient documentation

## 2017-11-24 DIAGNOSIS — Z8546 Personal history of malignant neoplasm of prostate: Secondary | ICD-10-CM | POA: Diagnosis not present

## 2017-11-24 DIAGNOSIS — Y92013 Bedroom of single-family (private) house as the place of occurrence of the external cause: Secondary | ICD-10-CM | POA: Insufficient documentation

## 2017-11-24 DIAGNOSIS — S0990XA Unspecified injury of head, initial encounter: Secondary | ICD-10-CM | POA: Diagnosis not present

## 2017-11-24 DIAGNOSIS — Z79899 Other long term (current) drug therapy: Secondary | ICD-10-CM | POA: Insufficient documentation

## 2017-11-24 DIAGNOSIS — Z87891 Personal history of nicotine dependence: Secondary | ICD-10-CM | POA: Diagnosis not present

## 2017-11-24 LAB — BASIC METABOLIC PANEL
Anion gap: 11 (ref 5–15)
BUN: 84 mg/dL — ABNORMAL HIGH (ref 8–23)
CALCIUM: 9.8 mg/dL (ref 8.9–10.3)
CHLORIDE: 101 mmol/L (ref 98–111)
CO2: 23 mmol/L (ref 22–32)
CREATININE: 1.79 mg/dL — AB (ref 0.61–1.24)
GFR calc non Af Amer: 33 mL/min — ABNORMAL LOW (ref >60)
GFR, EST AFRICAN AMERICAN: 39 mL/min — AB (ref >60)
GLUCOSE: 115 mg/dL — AB (ref 70–99)
Potassium: 5 mmol/L (ref 3.5–5.1)
Sodium: 135 mmol/L (ref 135–145)

## 2017-11-24 LAB — CBC
HCT: 25.4 % — ABNORMAL LOW (ref 40.0–52.0)
HEMOGLOBIN: 8.2 g/dL — AB (ref 13.0–18.0)
MCH: 26.3 pg (ref 26.0–34.0)
MCHC: 32.2 g/dL (ref 32.0–36.0)
MCV: 81.7 fL (ref 80.0–100.0)
PLATELETS: 318 10*3/uL (ref 150–440)
RBC: 3.11 MIL/uL — ABNORMAL LOW (ref 4.40–5.90)
RDW: 21.2 % — ABNORMAL HIGH (ref 11.5–14.5)
WBC: 8.2 10*3/uL (ref 3.8–10.6)

## 2017-11-24 LAB — URINALYSIS, COMPLETE (UACMP) WITH MICROSCOPIC
BILIRUBIN URINE: NEGATIVE
Glucose, UA: NEGATIVE mg/dL
Hgb urine dipstick: NEGATIVE
Ketones, ur: NEGATIVE mg/dL
Nitrite: NEGATIVE
Protein, ur: NEGATIVE mg/dL
Specific Gravity, Urine: 1.013 (ref 1.005–1.030)
pH: 8 (ref 5.0–8.0)

## 2017-11-24 LAB — TROPONIN I

## 2017-11-24 MED ORDER — SODIUM CHLORIDE 0.9 % IV BOLUS
1000.0000 mL | Freq: Once | INTRAVENOUS | Status: AC
Start: 2017-11-24 — End: 2017-11-24
  Administered 2017-11-24: 1000 mL via INTRAVENOUS

## 2017-11-24 MED ORDER — CEPHALEXIN 500 MG PO CAPS: 500.0000 mg | ORAL_CAPSULE | Freq: Two times a day (BID) | ORAL | 0 refills | Status: AC

## 2017-11-24 MED ORDER — CEPHALEXIN 500 MG PO CAPS
500.0000 mg | ORAL_CAPSULE | Freq: Once | ORAL | Status: AC
  Administered 2017-11-24: 500 mg via ORAL
  Filled 2017-11-24: qty 1

## 2017-11-24 NOTE — ED Notes (Signed)
Green tube redrawn and sent to lab

## 2017-11-24 NOTE — ED Triage Notes (Signed)
Pt via ems from home after dizzy episode this morning. He fell when arising from bed this morning, hit his head on night stand. Denies loc or injury at that time. He then went outside and was loading a lawnmower and he got dizzy. Daughter called ems. Pt alert & oriented with NAd noted.

## 2017-11-24 NOTE — ED Notes (Signed)
Patient transported to CT 

## 2017-11-24 NOTE — Discharge Instructions (Signed)
Take the antibiotic as prescribed and finish the full course unless told to stop by the urologist.  Call the urologist on Monday to arrange for follow-up.  Return to the ER for new, worsening, recurrent weakness, lightheadedness, blood in the urine, fever, abdominal pain, or any other new or worsening symptoms that concern you.

## 2017-11-24 NOTE — ED Notes (Signed)
Warm blanket provided to pt.

## 2017-11-24 NOTE — ED Provider Notes (Signed)
[facility] Emergency Department Provider Note ____________________________________________   First MD Initiated Contact with Patient 11/24/17 1202     (approximate)  I have reviewed the triage vital signs and the nursing notes.   HISTORY  Chief Complaint Loss of Consciousness    HPI Thomas Lopez is a 84 y.o. male with PMH as noted below who presents with head injury and dizziness.  The patient states that he initially got up this morning and thinks that he stepped on his shoelace when he was putting on his shoes, causing him to fall and hit his head on the nightstand.  He did not lose consciousness at this time.  He states that he then went outside, felt somewhat dizzy, and fell a second time but did not hit his head then.  The patient states that he now feels back to his baseline.  He denies abdominal pain, vomiting, fever, or any other acute symptoms.  He does have a urinary catheter that has been in for the last several weeks due to urinary retention.  Past Medical History:  Diagnosis Date  . Aorto-iliac atherosclerosis ([place]) 04/2015   by CT scan  . Carotid stenosis 11/2014   mild bilateral 1-39%, rpt 2 yrs  . Colon polyps    rpt colonoscopy due 2014  . ED (erectile dysfunction)    Trimix and VED  . Esophagitis   . Essential hypertension 06/29/2009  . GERD (gastroesophageal reflux disease)   . Glaucoma    severe ([place])  . HLD (hyperlipidemia)   . HTN (hypertension)   . Hyperglycemia   . IBS (irritable bowel syndrome)   . Prostate cancer [facility]) 2005   s/p seed implant, EBRT (Dr. Alinda Money at [facility]) recurrent treating with androgen deprivation referred to cancer center  . Smoking history     Patient Active Problem List   Diagnosis Date Noted  . CAD (coronary artery disease) 10/28/2017  . Emphysema of lung ([place]) 10/28/2017  . Bladder outlet obstruction 10/28/2017  . Abnormal weight loss 08/30/2017  . Dyspnea on exertion 03/01/2017  .  Normocytic anemia 03/01/2017  . Hot flashes 02/12/2016  . Right shoulder injury 08/18/2015  . Pseudoaphakia 07/31/2015  . Aorto-iliac atherosclerosis ([place]) 04/23/2015  . Health maintenance examination 08/13/2014  . Advanced care planning/counseling discussion 08/13/2014  . Memory impairment 08/13/2014  . Carotid stenosis 08/09/2013  . Presbyopia 12/31/2012  . Primary open angle glaucoma 12/15/2012  . History of smoking 09/29/2011  . Medicare annual wellness visit, subsequent 07/15/2011  . Primary adenocarcinoma of prostate ([place])   . Cataract, nuclear sclerotic senile 05/01/2011  . Essential hypertension 06/29/2009  . IRRITABLE BOWEL SYNDROME 04/08/2008  . History of colonic polyps 06/08/2007  . Prediabetes 06/05/2007  . ESOPHAGITIS 06/05/2007  . 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 Mixed hyperlipidemia 06/04/2007    Past Surgical History:  Procedure Laterality Date  . CATARACT EXTRACTION Left 05/2017   Dr. Edilia Bo  . CATARACT EXTRACTION, BILATERAL  December 2016   [place], Dr. Raynelle Fanning  . COLONOSCOPY  03/01/01   Multiple polyps, divertic//adenomatous polyps  . COLONOSCOPY  04/24/01   multiple polyps  . COLONOSCOPY  10/23/01   Polyps benign-repeat every 2 years  . COLONOSCOPY  06/23/04   Adenom/hyperplastic polyps; multiple external hemorrhoids  . COLONOSCOPY  06/08/07   single small polyp-repeat 5 years  . ESOPHAGOGASTRODUODENOSCOPY  03/01/01   H.H.; gastritis esoph. dudodenitis  . NCS/Wrists Left Carpal  2/02   Min./right improved  . PFTs  10/2012   FVC 64%, FEV1 41%, ratio 0.62  . PROSTATE BIOPSY  12/04   positive; radioactive seed implant (Dr. Joelyn Oms)  . Prostate Ext Beam Radiation  1/27-07/23/03   Dr. Danny Lawless  . SPIROMETRY  09/2011   WNL  . Wrist Release  6/98; 9/99   Right    Prior to Admission medications   Medication Sig Start Date End Date Taking? Authorizing Provider  aspirin EC 81 MG tablet Take 81 mg by mouth daily.    [provider]  Calcium Carbonate-Vitamin D (CALCIUM 600/VITAMIN D) 600-400 MG-UNIT chew tablet Chew 1 tablet by mouth daily. 10/25/17   [name], MD  cephALEXin (KEFLEX) 500 MG capsule Take 1 capsule (500 mg total) by mouth 2 (two) times daily for 14 days. 11/24/17 12/08/17  Arta Silence, MD  ferrous sulfate 325 (65 FE) MG tablet Take 1 tablet (325 mg total) by mouth daily with breakfast. 10/25/17   [name], MD  leuprolide (LUPRON) 22.5 MG injection Inject 22.5 mg into the muscle every 3 (three) months.    [provider]  lisinopril (PRINIVIL,ZESTRIL) 10 MG tablet Take 1 tablet (10 mg total) by mouth daily. 10/25/17   [name], MD  methylcellulose packet Take 1 each by mouth as needed for constipation. Reported on 11/05/2015    [provider]  omeprazole (PRILOSEC) 40 MG capsule Take 1 capsule (40 mg total) by mouth daily. 10/25/17   [name], MD  rosuvastatin (CRESTOR) 10 MG tablet Take 1 tablet (10 mg total) by mouth daily. 10/25/17   [name], MD  tamsulosin (FLOMAX) 0.4 MG CAPS capsule Take 1 capsule (0.4 mg total) by mouth daily after supper. 10/25/17   [name], MD  venlafaxine XR (EFFEXOR-XR) 37.5 MG 24 hr capsule Take 1 capsule (37.5 mg total) by mouth daily with breakfast. 10/25/17   [name], MD    Allergies Patient has no known allergies.  Family History  Problem Relation Age of Onset  . Cancer Mother        colon  . Hyperlipidemia Mother   . Other Mother        Carotid disease  . Coronary artery disease Neg Hx   . Stroke Neg Hx     Social History Social History   Tobacco Use  . Smoking status: Former Smoker    Last attempt to quit: 05/23/1993    Years since quitting: 24.5  . Smokeless tobacco: Never Used  Substance Use Topics  . Alcohol use: Yes    Comment: Rare  . Drug use: No    Review of Systems  Constitutional: No fever. Eyes: No redness. ENT: No neck pain. Cardiovascular: Denies  chest pain. Respiratory: Denies shortness of breath. Gastrointestinal: No nau vomiting Genitourinary: Negative for dysuria or hematuria.  Musculoskeletal: Negative for back pain. Skin: Negative for rash. Neurological: Negative for headache.   ____________________________________________   PHYSICAL EXAM:  VITAL SIGNS: ED Triage Vitals  Enc Vitals Group     BP 11/24/17 1131 (!) 89/53     Pulse Rate 11/24/17 1133 (!) 55     Resp 11/24/17 1131 (!) 21     Temp 11/24/17 1133 (!) 97.4 F (36.3 C)     Temp Source 11/24/17 1133 Oral     SpO2 11/24/17 1133 100 %     Weight 11/24/17 1134 147 lb (66.7 kg)     Height 11/24/17 1134 5\' 9"  (1.753 m)     Head Circumference --  Peak Flow --      Pain Score 11/24/17 1134 0     Pain Loc --      Pain Edu? --      Excl. in [place]? --     Constitutional: Alert and oriented. Well appearing for age and in no acute distress. Eyes: Conjunctivae are normal.  EOMI.  PERRLA. Head: Atraumatic. Nose: No congestion/rhinnorhea. Mouth/Throat: Mucous membranes are moist.   Neck: Normal range of motion.  No midline cervical spinal tenderness. Cardiovascular: Normal rate, regular rhythm. Grossly normal heart sounds.  Good peripheral circulation. Respiratory: Normal respiratory effort.  No retractions. Lungs CTAB. Gastrointestinal: Soft and nontender. No distention.  Genitourinary: No flank tenderness. Musculoskeletal: No lower extremity edema.  Extremities warm and well perfused.  Neurologic:  Normal speech and language.  Motor intact in all extremities.  Normal coordination.  No gross focal neurologic deficits are appreciated.  Skin:  Skin is warm and dry. No rash noted. Psychiatric: Mood and affect are normal. Speech and behavior are normal.  ____________________________________________   LABS (all labs ordered are listed, but only abnormal results are displayed)  Labs Reviewed  BASIC METABOLIC PANEL - Abnormal; Notable for the following  components:      Result Value   Glucose, Bld 115 (*)    BUN 84 (*)    Creatinine, Ser 1.79 (*)    GFR calc non Af Amer 33 (*)    GFR calc Af Amer 39 (*)    All other components within normal limits  CBC - Abnormal; Notable for the following components:   RBC 3.11 (*)    Hemoglobin 8.2 (*)    HCT 25.4 (*)    RDW 21.2 (*)    All other components within normal limits  URINALYSIS, COMPLETE (UACMP) WITH MICROSCOPIC - Abnormal; Notable for the following components:   Color, Urine AMBER (*)    APPearance TURBID (*)    Leukocytes, UA SMALL (*)    RBC / HPF >50 (*)    WBC, UA >50 (*)    Bacteria, UA MANY (*)    All other components within normal limits  TROPONIN I  TROPONIN I  CBG MONITORING, ED   ____________________________________________  EKG  ED ECG REPORT I, Arta Silence, the attending physician, personally viewed and interpreted this ECG.  Date: 11/24/2017 EKG Time: 1138 Rate: 53 Rhythm: normal sinus rhythm QRS Axis: normal Intervals: normal ST/T Wave abnormalities: Early repolarization Narrative Interpretation: no evidence of acute ischemia; no significant change when compared to EKG of 02/12/2016  ____________________________________________  RADIOLOGY  CT head: No ICH or other acute findings  ____________________________________________   PROCEDURES  Procedure(s) performed: No  Procedures  Critical Care performed: No ____________________________________________   INITIAL IMPRESSION / ASSESSMENT AND PLAN / ED COURSE  Pertinent labs & imaging results that were available during my care of the patient were reviewed by me and considered in my medical decision making (see chart for details).  84 year old male with PMH as noted above presents with dizziness, occurring after initially had an apparent mechanical fall this morning and hit his head.  He then had a second fall preceded by some dizziness although he states that he still thinks he tripped  during the second fall.  He has had a Foley catheter over the last several weeks due to urinary retention.  He otherwise has been at his baseline state of health.  On exam, the patient is well-appearing, alert, and has normal vital signs.  There is no visible trauma.  Neuro exam is nonfocal.  The remainder of the exam is as described above.  Overall I suspect most likely mechanical fall causing mild concussion, however I am also considering precipitating cause such as dehydration, electrolyte abnormality, UTI, or less likely cardiac etiology.  Will obtain a CT head to rule out ICH, obtain basic and cardiac labs, UA, and reassess.  ----------------------------------------- 3:11 PM on 11/24/2017 -----------------------------------------  CT head shows no significant findings.  Patient's lab work-up is unremarkable.  He is anemic, however this is chronic.  His BUN and creatinine are not significantly elevated from prior.  However, his UA shows findings consistent with possible UTI.  This could explain him being somewhat weaker than usual.  I suspect that is likely related to the Foley that he has had for the last several weeks.  I will give the patient a prescription for an antibiotic, as well as a dose here.  I instructed him to call the urologist on Monday to verify when he is supposed to follow-up.  The patient feels well and would like to go home.  Return precautions given, and he expresses understanding.  ____________________________________________   FINAL CLINICAL IMPRESSION(S) / ED DIAGNOSES  Final diagnoses:  Near syncope  Acute cystitis without hematuria      NEW MEDICATIONS STARTED DURING THIS VISIT:  Discharge Medication List as of 11/24/2017  3:10 PM    START taking these medications   Details  cephALEXin (KEFLEX) 500 MG capsule Take 1 capsule (500 mg total) by mouth 2 (two) times daily for 14 days., Starting Fri 11/24/2017, Until Fri 12/08/2017, Print         Note:  This  document was prepared using Dragon voice recognition software and may include unintentional dictation errors.    Arta Silence, MD 11/24/17 1558

## 2017-11-27 ENCOUNTER — Ambulatory Visit: Payer: Medicare PPO

## 2017-11-27 ENCOUNTER — Other Ambulatory Visit: Payer: Self-pay

## 2017-11-27 ENCOUNTER — Emergency Department
Admission: EM | Admit: 2017-11-27 | Discharge: 2017-11-27 | Disposition: A | Discharge status: Home / Self Care | Payer: Medicare PPO | Attending: Student in an Organized Health Care Education/Training Program | Admitting: Student in an Organized Health Care Education/Training Program

## 2017-11-27 DIAGNOSIS — I10 Essential (primary) hypertension: Secondary | ICD-10-CM | POA: Insufficient documentation

## 2017-11-27 DIAGNOSIS — Z87891 Personal history of nicotine dependence: Secondary | ICD-10-CM | POA: Diagnosis not present

## 2017-11-27 DIAGNOSIS — Z7982 Long term (current) use of aspirin: Secondary | ICD-10-CM | POA: Diagnosis not present

## 2017-11-27 DIAGNOSIS — Z8546 Personal history of malignant neoplasm of prostate: Secondary | ICD-10-CM | POA: Insufficient documentation

## 2017-11-27 DIAGNOSIS — R339 Retention of urine, unspecified: Secondary | ICD-10-CM

## 2017-11-27 DIAGNOSIS — I2510 Atherosclerotic heart disease of native coronary artery without angina pectoris: Secondary | ICD-10-CM | POA: Insufficient documentation

## 2017-11-27 DIAGNOSIS — Z79899 Other long term (current) drug therapy: Secondary | ICD-10-CM | POA: Diagnosis not present

## 2017-11-27 LAB — URINALYSIS, COMPLETE (UACMP) WITH MICROSCOPIC
Bilirubin Urine: NEGATIVE
Glucose, UA: NEGATIVE mg/dL
Ketones, ur: NEGATIVE mg/dL
NITRITE: NEGATIVE
PH: 5 (ref 5.0–8.0)
Protein, ur: NEGATIVE mg/dL
SPECIFIC GRAVITY, URINE: 1.009 (ref 1.005–1.030)

## 2017-11-27 MED ORDER — CEPHALEXIN 500 MG PO CAPS: 500.0000 mg | ORAL_CAPSULE | Freq: Once | ORAL | Status: DC

## 2017-11-27 MED ORDER — TAMSULOSIN HCL 0.4 MG PO CAPS: 0.4000 mg | ORAL_CAPSULE | Freq: Every day | ORAL | 0 refills | Status: DC

## 2017-11-27 NOTE — ED Notes (Signed)
First Nurse Note:  Patient presents to the ED with urinary retention from [facility].  Patient had his foley removed this morning at 9:30am and since that time patient has not been able to void.  Staff at [facility] could not replace foley because they did not have a doctor on premises to order it.  Patient reports feeling the urge to urinate but being unable to do so.

## 2017-11-27 NOTE — Progress Notes (Signed)
Catheter Removal  Patient is present today for a catheter removal.  33ml of water was drained from the balloon. A 16FR foley cath was removed from the bladder no complications were noted . Patient tolerated well.  Preformed by: Cristie Hem, CMA  Follow up/ Additional notes:

## 2017-11-27 NOTE — ED Provider Notes (Signed)
[facility] Emergency Department Provider Note    First MD Initiated Contact with Patient 11/27/17 1745     (approximate)  I have reviewed the triage vital signs and the nursing notes.   HISTORY  Chief Complaint Urinary Retention    HPI Thomas Lopez is a 84 y.o. male history of BPH status post recent visit to the ER with urinary retention status post Foley catheter placement presents the ER with persistent discomfort and inability to empty his bladder after having Foley catheter removed today around 10 AM.  States he feels a needs to urinate but has unable to empty his bladder or even have any relief.  Denies any fevers.  Feels identical to previous visit.  Discomfort is mild.    Past Medical History:  Diagnosis Date  . Aorto-iliac atherosclerosis ([place]) 04/2015   by CT scan  . Carotid stenosis 11/2014   mild bilateral 1-39%, rpt 2 yrs  . Colon polyps    rpt colonoscopy due 2014  . ED (erectile dysfunction)    Trimix and VED  . Esophagitis   . Essential hypertension 06/29/2009  . GERD (gastroesophageal reflux disease)   . Glaucoma    severe ([place])  . HLD (hyperlipidemia)   . HTN (hypertension)   . Hyperglycemia   . IBS (irritable bowel syndrome)   . Prostate cancer [facility]) 2005   s/p seed implant, EBRT (Dr. Alinda Money at [facility]) recurrent treating with androgen deprivation referred to cancer center  . Smoking history    Family History  Problem Relation Age of Onset  . Cancer Mother        colon  . Hyperlipidemia Mother   . Other Mother        Carotid disease  . Coronary artery disease Neg Hx   . Stroke Neg Hx    Past Surgical History:  Procedure Laterality Date  . CATARACT EXTRACTION Left 05/2017   Dr. Edilia Bo  . CATARACT EXTRACTION, BILATERAL  December 2016   [place], Dr. Raynelle Fanning  . COLONOSCOPY  03/01/01   Multiple polyps, divertic//adenomatous polyps  . COLONOSCOPY  04/24/01   multiple polyps  . COLONOSCOPY  10/23/01     Polyps benign-repeat every 2 years  . COLONOSCOPY  06/23/04   Adenom/hyperplastic polyps; multiple external hemorrhoids  . COLONOSCOPY  06/08/07   single small polyp-repeat 5 years  . ESOPHAGOGASTRODUODENOSCOPY  03/01/01   H.H.; gastritis esoph. dudodenitis  . NCS/Wrists Left Carpal  2/02   Min./right improved  . PFTs  10/2012   FVC 64%, FEV1 41%, ratio 0.62  . PROSTATE BIOPSY  12/04   positive; radioactive seed implant (Dr. Joelyn Oms)  . Prostate Ext Beam Radiation  1/27-07/23/03   Dr. Danny Lawless  . SPIROMETRY  09/2011   WNL  . Wrist Release  6/98; 9/99   Right   Patient Active Problem List   Diagnosis Date Noted  . CAD (coronary artery disease) 10/28/2017  . Emphysema of lung ([place]) 10/28/2017  . Bladder outlet obstruction 10/28/2017  . Abnormal weight loss 08/30/2017  . Dyspnea on exertion 03/01/2017  . Normocytic anemia 03/01/2017  . Hot flashes 02/12/2016  . Right shoulder injury 08/18/2015  . Pseudoaphakia 07/31/2015  . Aorto-iliac atherosclerosis ([place]) 04/23/2015  . Health maintenance examination 08/13/2014  . Advanced care planning/counseling discussion 08/13/2014  . Memory impairment 08/13/2014  . Carotid stenosis 08/09/2013  . Presbyopia 12/31/2012  . Primary open angle glaucoma 12/15/2012  . History of smoking 09/29/2011  . Medicare annual  wellness visit, subsequent 07/15/2011  . Primary adenocarcinoma of prostate ([place])   . Cataract, nuclear sclerotic senile 05/01/2011  . Essential hypertension 06/29/2009  . IRRITABLE BOWEL SYNDROME 04/08/2008  . History of colonic polyps 06/08/2007  . Prediabetes 06/05/2007  . ESOPHAGITIS 06/05/2007  . 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 Mixed hyperlipidemia 06/04/2007      Prior to Admission medications   Medication Sig Start Date End Date Taking? Authorizing Provider  aspirin EC 81 MG tablet Take 81 mg by mouth daily.    [provider]  Calcium Carbonate-Vitamin D (CALCIUM 600/VITAMIN D) 600-400 MG-UNIT  chew tablet Chew 1 tablet by mouth daily. 10/25/17   [name], MD  cephALEXin (KEFLEX) 500 MG capsule Take 1 capsule (500 mg total) by mouth 2 (two) times daily for 14 days. 11/24/17 12/08/17  Arta Silence, MD  ferrous sulfate 325 (65 FE) MG tablet Take 1 tablet (325 mg total) by mouth daily with breakfast. 10/25/17   [name], MD  leuprolide (LUPRON) 22.5 MG injection Inject 22.5 mg into the muscle every 3 (three) months.    [provider]  lisinopril (PRINIVIL,ZESTRIL) 10 MG tablet Take 1 tablet (10 mg total) by mouth daily. 10/25/17   [name], MD  methylcellulose packet Take 1 each by mouth as needed for constipation. Reported on 11/05/2015    [provider]  omeprazole (PRILOSEC) 40 MG capsule Take 1 capsule (40 mg total) by mouth daily. 10/25/17   [name], MD  rosuvastatin (CRESTOR) 10 MG tablet Take 1 tablet (10 mg total) by mouth daily. 10/25/17   [name], MD  tamsulosin (FLOMAX) 0.4 MG CAPS capsule Take 1 capsule (0.4 mg total) by mouth daily after supper. 10/25/17   [name], MD  venlafaxine XR (EFFEXOR-XR) 37.5 MG 24 hr capsule Take 1 capsule (37.5 mg total) by mouth daily with breakfast. 10/25/17   [name], MD    Allergies Patient has no known allergies.    Social History Social History   Tobacco Use  . Smoking status: Former Smoker    Last attempt to quit: 05/23/1993    Years since quitting: 24.5  . Smokeless tobacco: Never Used  Substance Use Topics  . Alcohol use: Yes    Comment: Rare  . Drug use: No    Review of Systems Patient denies headaches, rhinorrhea, blurry vision, numbness, shortness of breath, chest pain, edema, cough, abdominal pain, nausea, vomiting, diarrhea, dysuria, fevers, rashes or hallucinations unless otherwise stated above in HPI. ____________________________________________   PHYSICAL EXAM:  VITAL SIGNS: Vitals:   11/27/17 1650  BP: 118/71  Pulse: (!) 55  Resp: 16    Temp: 98.1 F (36.7 C)  SpO2: 98%    Constitutional: Alert and oriented.  Eyes: Conjunctivae are normal.  Head: Atraumatic. Nose: No congestion/rhinnorhea. Mouth/Throat: Mucous membranes are moist.   Neck: No stridor. Painless ROM.  Cardiovascular: Normal rate, regular rhythm. Grossly normal heart sounds.  Good peripheral circulation. Respiratory: Normal respiratory effort.  No retractions. Lungs CTAB. Gastrointestinal: Soft and nontender. No distention. No abdominal bruits. No CVA tenderness. Genitourinary: + suprapubic fullness and ttp Musculoskeletal: No lower extremity tenderness nor edema.  No joint effusions. Neurologic:  Normal speech and language. No gross focal neurologic deficits are appreciated. No facial droop Skin:  Skin is warm, dry and intact. No rash noted. Psychiatric: Mood and affect are normal. Speech and behavior are normal.  ____________________________________________   LABS (all labs ordered are listed, but only abnormal results are displayed)  No  results found for this or any previous visit (from the past 24 hour(s)). ____________________________________________ ____________________________________________  [id number]   ____________________________________________   PROCEDURES  Procedure(s) performed:  Procedures    Critical Care performed: no ____________________________________________   INITIAL IMPRESSION / ASSESSMENT AND PLAN / ED COURSE  Pertinent labs & imaging results that were available during my care of the patient were reviewed by me and considered in my medical decision making (see chart for details).   DDX: Urinary retention, UTI, urethral spasm   BRENDYN MCLAREN is a 84 y.o. who presents to the ED with Symptoms as described above.  Patient with almost 500 on bladder scan patient unable to empty his bladder.  Patient has failed voiding trial.  To prevent further bladder distention injury will replace Foley catheter.  Urine will  be sent to evaluate for UTI.  Patient was sent for follow-up with urology.       As part of my medical decision making, I reviewed the following data within the [facility] notes reviewed and incorporated, Labs reviewed, notes from prior ED visits.  ____________________________________________   FINAL CLINICAL IMPRESSION(S) / ED DIAGNOSES  Final diagnoses:  Urinary retention      NEW MEDICATIONS STARTED DURING THIS VISIT:  New Prescriptions   No medications on file     Note:  This document was prepared using Dragon voice recognition software and may include unintentional dictation errors.    Merlyn Lot, MD 11/27/17 Darlin Drop

## 2017-11-27 NOTE — ED Notes (Signed)
States had foley catheter removed this morning at 1000, and has been unable to void since that time.  Skin warm and dry.  Posture upright and relaxed.  NAD

## 2017-11-27 NOTE — ED Triage Notes (Signed)
Pt had foley catheter removed 10 AM today, has not urinated since. Pt states he feels urge to pee but does not feel uncomfortable at this time.Pt alert and oriented X4, active, cooperative, pt in NAD. RR even and unlabored, color WNL.

## 2017-11-27 NOTE — ED Notes (Signed)
Bladder scan 432

## 2017-11-29 ENCOUNTER — Encounter: Payer: Self-pay | Admitting: Urology

## 2017-11-29 ENCOUNTER — Ambulatory Visit (INDEPENDENT_AMBULATORY_CARE_PROVIDER_SITE_OTHER): Payer: Medicare PPO | Admitting: Urology

## 2017-11-29 ENCOUNTER — Encounter: Payer: Self-pay | Admitting: Emergency Medicine

## 2017-11-29 ENCOUNTER — Emergency Department
Admission: EM | Admit: 2017-11-29 | Discharge: 2017-11-29 | Disposition: A | Discharge status: Home / Self Care | Payer: Medicare PPO | Attending: Emergency Medicine | Admitting: Emergency Medicine

## 2017-11-29 VITALS — BP 80/39 | HR 74

## 2017-11-29 DIAGNOSIS — N1330 Unspecified hydronephrosis: Secondary | ICD-10-CM

## 2017-11-29 DIAGNOSIS — Z8546 Personal history of malignant neoplasm of prostate: Secondary | ICD-10-CM | POA: Diagnosis not present

## 2017-11-29 DIAGNOSIS — Z87891 Personal history of nicotine dependence: Secondary | ICD-10-CM | POA: Diagnosis not present

## 2017-11-29 DIAGNOSIS — Z7982 Long term (current) use of aspirin: Secondary | ICD-10-CM | POA: Insufficient documentation

## 2017-11-29 DIAGNOSIS — Z79899 Other long term (current) drug therapy: Secondary | ICD-10-CM | POA: Diagnosis not present

## 2017-11-29 DIAGNOSIS — I2510 Atherosclerotic heart disease of native coronary artery without angina pectoris: Secondary | ICD-10-CM | POA: Diagnosis not present

## 2017-11-29 DIAGNOSIS — R42 Dizziness and giddiness: Secondary | ICD-10-CM

## 2017-11-29 DIAGNOSIS — R319 Hematuria, unspecified: Secondary | ICD-10-CM | POA: Insufficient documentation

## 2017-11-29 DIAGNOSIS — I10 Essential (primary) hypertension: Secondary | ICD-10-CM | POA: Diagnosis not present

## 2017-11-29 DIAGNOSIS — N390 Urinary tract infection, site not specified: Secondary | ICD-10-CM | POA: Diagnosis not present

## 2017-11-29 DIAGNOSIS — R339 Retention of urine, unspecified: Secondary | ICD-10-CM | POA: Diagnosis not present

## 2017-11-29 LAB — CBC
HEMATOCRIT: 28.5 % — AB (ref 40.0–52.0)
Hemoglobin: 9.4 g/dL — ABNORMAL LOW (ref 13.0–18.0)
MCH: 27.3 pg (ref 26.0–34.0)
MCHC: 33 g/dL (ref 32.0–36.0)
MCV: 82.8 fL (ref 80.0–100.0)
Platelets: 290 10*3/uL (ref 150–440)
RBC: 3.44 MIL/uL — ABNORMAL LOW (ref 4.40–5.90)
RDW: 22 % — AB (ref 11.5–14.5)
WBC: 9.3 10*3/uL (ref 3.8–10.6)

## 2017-11-29 LAB — BASIC METABOLIC PANEL
Anion gap: 8 (ref 5–15)
BUN: 58 mg/dL — AB (ref 8–23)
CALCIUM: 9.5 mg/dL (ref 8.9–10.3)
CO2: 21 mmol/L — AB (ref 22–32)
CREATININE: 1.66 mg/dL — AB (ref 0.61–1.24)
Chloride: 109 mmol/L (ref 98–111)
GFR calc Af Amer: 42 mL/min — ABNORMAL LOW (ref >60)
GFR calc non Af Amer: 37 mL/min — ABNORMAL LOW (ref >60)
GLUCOSE: 127 mg/dL — AB (ref 70–99)
Potassium: 4.5 mmol/L (ref 3.5–5.1)
Sodium: 138 mmol/L (ref 135–145)

## 2017-11-29 LAB — HEPATIC FUNCTION PANEL
ALBUMIN: 3.4 g/dL — AB (ref 3.5–5.0)
ALK PHOS: 53 U/L (ref 38–126)
ALT: 13 U/L (ref 0–44)
AST: 22 U/L (ref 15–41)
BILIRUBIN TOTAL: 0.3 mg/dL (ref 0.3–1.2)
Bilirubin, Direct: <0.1 mg/dL (ref 0.0–0.2)
Total Protein: 7.9 g/dL (ref 6.5–8.1)

## 2017-11-29 LAB — DIFFERENTIAL
Basophils Absolute: 0.1 10*3/uL (ref 0–0.1)
Basophils Relative: 1 %
EOS ABS: 0.1 10*3/uL (ref 0–0.7)
EOS PCT: 1 %
LYMPHS ABS: 1 10*3/uL (ref 1.0–3.6)
Lymphocytes Relative: 11 %
Monocytes Absolute: 0.5 10*3/uL (ref 0.2–1.0)
Monocytes Relative: 5 %
NEUTROS PCT: 82 %
Neutro Abs: 7.6 10*3/uL — ABNORMAL HIGH (ref 1.4–6.5)

## 2017-11-29 LAB — TROPONIN I: Troponin I: <0.03 ng/mL (ref <0.03)

## 2017-11-29 LAB — URINE CULTURE: Culture: NO GROWTH

## 2017-11-29 MED ORDER — CIPROFLOXACIN HCL 500 MG PO TABS
500.0000 mg | ORAL_TABLET | Freq: Once | ORAL | Status: AC
  Administered 2017-11-29: 500 mg via ORAL
  Filled 2017-11-29: qty 1

## 2017-11-29 MED ORDER — CIPROFLOXACIN HCL 500 MG PO TABS: 500.0000 mg | ORAL_TABLET | Freq: Two times a day (BID) | ORAL | 0 refills | Status: AC

## 2017-11-29 NOTE — Discharge Instructions (Addendum)
Please return if you are worse.  Please make sure you are drinking enough fluids.  Please follow-up with your regular doctor later on this week.  Take the Cipro 1 pill twice a day.

## 2017-11-29 NOTE — Progress Notes (Signed)
11/29/2017 1:32 PM   Thomas Lopez 1932/08/22 [id number]  Referring provider: [name], MD [address] [place], [place] [address]  Chief Complaint  Patient presents with  . Follow-up    HPI: 84 year old male recently seen for recurrent urinary retention.  He had bladder distention and bilateral hydronephrosis/hydroureter.  After an indwelling catheter a follow-up ultrasound did show persistent moderate left hydronephrosis and mild right hydronephrosis.  His catheter was removed on 7/8 however he return to the ED with recurrent retention and the catheter was replaced.  He has a history of prostate cancer status post radiation and is currently on androgen deprivation therapy.  He has had recent bouts of dizziness and had a fall last week and was seen in the ED.  He is complaining of dizziness today and his BP was in the low 16R systolic.   PMH: Past Medical History:  Diagnosis Date  . Aorto-iliac atherosclerosis ([place]) 04/2015   by CT scan  . Carotid stenosis 11/2014   mild bilateral 1-39%, rpt 2 yrs  . Colon polyps    rpt colonoscopy due 2014  . ED (erectile dysfunction)    Trimix and VED  . Esophagitis   . Essential hypertension 06/29/2009  . GERD (gastroesophageal reflux disease)   . Glaucoma    severe ([place])  . HLD (hyperlipidemia)   . HTN (hypertension)   . Hyperglycemia   . IBS (irritable bowel syndrome)   . Prostate cancer [facility]) 2005   s/p seed implant, EBRT (Dr. Alinda Money at [facility]) recurrent treating with androgen deprivation referred to cancer center  . Smoking history     Surgical History: Past Surgical History:  Procedure Laterality Date  . CATARACT EXTRACTION Left 05/2017   Dr. Edilia Bo  . CATARACT EXTRACTION, BILATERAL  December 2016   [place], Dr. Raynelle Fanning  . COLONOSCOPY  03/01/01   Multiple polyps, divertic//adenomatous polyps  . COLONOSCOPY  04/24/01   multiple polyps  . COLONOSCOPY  10/23/01   Polyps benign-repeat  every 2 years  . COLONOSCOPY  06/23/04   Adenom/hyperplastic polyps; multiple external hemorrhoids  . COLONOSCOPY  06/08/07   single small polyp-repeat 5 years  . ESOPHAGOGASTRODUODENOSCOPY  03/01/01   H.H.; gastritis esoph. dudodenitis  . NCS/Wrists Left Carpal  2/02   Min./right improved  . PFTs  10/2012   FVC 64%, FEV1 41%, ratio 0.62  . PROSTATE BIOPSY  12/04   positive; radioactive seed implant (Dr. Joelyn Oms)  . Prostate Ext Beam Radiation  1/27-07/23/03   Dr. Danny Lawless  . SPIROMETRY  09/2011   WNL  . Wrist Release  6/98; 9/99   Right    Home Medications:  Allergies as of 11/29/2017   No Known Allergies     Medication List        Accurate as of 11/29/17  1:32 PM. Always use your most recent med list.          aspirin EC 81 MG tablet Take 81 mg by mouth daily.   Calcium Carbonate-Vitamin D 600-400 MG-UNIT chew tablet Commonly known as:  CALCIUM 600/VITAMIN D Chew 1 tablet by mouth daily.   cephALEXin 500 MG capsule Commonly known as:  KEFLEX Take 1 capsule (500 mg total) by mouth 2 (two) times daily for 14 days.   ferrous sulfate 325 (65 FE) MG tablet Take 1 tablet (325 mg total) by mouth daily with breakfast.   leuprolide 22.5 MG injection Commonly known as:  LUPRON Inject 22.5 mg into the muscle  every 3 (three) months.   lisinopril 10 MG tablet Commonly known as:  PRINIVIL,ZESTRIL Take 1 tablet (10 mg total) by mouth daily.   methylcellulose packet Take 1 each by mouth as needed for constipation. Reported on 11/05/2015   omeprazole 40 MG capsule Commonly known as:  PRILOSEC Take 1 capsule (40 mg total) by mouth daily.   rosuvastatin 10 MG tablet Commonly known as:  CRESTOR Take 1 tablet (10 mg total) by mouth daily.   tamsulosin 0.4 MG Caps capsule Commonly known as:  FLOMAX Take 1 capsule (0.4 mg total) by mouth daily after supper.   timolol 0.5 % ophthalmic solution Commonly known as:  TIMOPTIC   venlafaxine XR 37.5 MG 24 hr capsule Commonly known  as:  EFFEXOR-XR Take 1 capsule (37.5 mg total) by mouth daily with breakfast.       Allergies: No Known Allergies  Family History: Family History  Problem Relation Age of Onset  . Cancer Mother        colon  . Hyperlipidemia Mother   . Other Mother        Carotid disease  . Coronary artery disease Neg Hx   . Stroke Neg Hx     Social History:  reports that he quit smoking about 24 years ago. He has never used smokeless tobacco. He reports that he drinks alcohol. He reports that he does not use drugs.  ROS: UROLOGY Frequent Urination?: No Hard to postpone urination?: No Burning/pain with urination?: No Get up at night to urinate?: No Leakage of urine?: No Urine stream starts and stops?: No Trouble starting stream?: No Do you have to strain to urinate?: No Blood in urine?: No Urinary tract infection?: No Sexually transmitted disease?: No Injury to kidneys or bladder?: No Painful intercourse?: No Weak stream?: No Erection problems?: No Penile pain?: No  Gastrointestinal Nausea?: No Vomiting?: No Indigestion/heartburn?: No Diarrhea?: No Constipation?: No  Constitutional Fever: No Night sweats?: No Weight loss?: No Fatigue?: No  Skin Skin rash/lesions?: No Itching?: No  Eyes Blurred vision?: No Double vision?: No  Ears/Nose/Throat Sore throat?: No Sinus problems?: No  Hematologic/Lymphatic Swollen glands?: No Easy bruising?: No  Cardiovascular Leg swelling?: No Chest pain?: No  Respiratory Cough?: Yes Shortness of breath?: No  Endocrine Excessive thirst?: Yes  Musculoskeletal Back pain?: No Joint pain?: No  Neurological Headaches?: Yes Dizziness?: Yes  Psychologic Depression?: No Anxiety?: No  Physical Exam: BP (!) 80/39 (BP Location: Left Arm, Patient Position: Sitting, Cuff Size: Normal)   Pulse 74   Constitutional:  Alert and oriented, No acute distress. HEENT: [place] AT, moist mucus membranes.  Trachea midline, no  masses. Cardiovascular: No clubbing, cyanosis, or edema. Respiratory: Normal respiratory effort, no increased work of breathing. GI: Abdomen is soft, nontender, nondistended, no abdominal masses GU: No CVA tenderness Lymph: No cervical or inguinal lymphadenopathy. Skin: No rashes, bruises or suspicious lesions. Neurologic: Grossly intact, no focal deficits, moving all 4 extremities. Psychiatric: Normal mood and affect.   Assessment & Plan:   84 year old male with recurrent urinary retention.  This most likely is chronic and unlikely obstructed based on his ADT and prior prostate cancer treatment.  His hydronephrosis is most likely not obstructed and related to chronic bladder distention.  Have recommended cystoscopy to evaluate his outlet.  Will schedule a Lasix renogram.  He was transported to the ED and a wheelchair for further evaluation of his hypotension.  Would recommend stopping his tamsulosin.    Abbie Sons, [facility] [address], [place], [place] [address] [phone]

## 2017-11-29 NOTE — ED Notes (Signed)
Attempted IV access x3. Unsuccessful.  

## 2017-11-29 NOTE — ED Triage Notes (Signed)
Patient presents to ED via POV from urologist office due to hypotension. Patient reports dizziness. A&O x4.

## 2017-11-29 NOTE — ED Provider Notes (Signed)
[facility] Emergency Department Provider Note   ____________________________________________   First MD Initiated Contact with Patient 11/29/17 1400     (approximate)  I have reviewed the triage vital signs and the nursing notes.   HISTORY  Chief Complaint Dizziness   HPI Thomas Lopez is a 84 y.o. male patient reports that when he woke up this morning he was very lightheaded and dizzy not vertiginous.  He has since resolved and he feels fine.  He was in the ER recently for urinary retention had a Foley placed.  At that time he had a lot of red and white blood cells in his urine.  He denies any problems now.  He says he feels fine   Past Medical History:  Diagnosis Date  . Aorto-iliac atherosclerosis ([place]) 04/2015   by CT scan  . Carotid stenosis 11/2014   mild bilateral 1-39%, rpt 2 yrs  . Colon polyps    rpt colonoscopy due 2014  . ED (erectile dysfunction)    Trimix and VED  . Esophagitis   . Essential hypertension 06/29/2009  . GERD (gastroesophageal reflux disease)   . Glaucoma    severe ([place])  . HLD (hyperlipidemia)   . HTN (hypertension)   . Hyperglycemia   . IBS (irritable bowel syndrome)   . Prostate cancer [facility]) 2005   s/p seed implant, EBRT ([facility]. Alinda Money at [facility]) recurrent treating with androgen deprivation referred to cancer center  . Smoking history     Patient Active Problem List   Diagnosis Date Noted  . CAD (coronary artery disease) 10/28/2017  . Emphysema of lung ([place]) 10/28/2017  . Bladder outlet obstruction 10/28/2017  . Abnormal weight loss 08/30/2017  . Dyspnea on exertion 03/01/2017  . Normocytic anemia 03/01/2017  . Hot flashes 02/12/2016  . Right shoulder injury 08/18/2015  . Pseudoaphakia 07/31/2015  . Aorto-iliac atherosclerosis ([place]) 04/23/2015  . Health maintenance examination 08/13/2014  . Advanced care planning/counseling discussion 08/13/2014  . Memory impairment 08/13/2014  . Carotid stenosis  08/09/2013  . Presbyopia 12/31/2012  . Primary open angle glaucoma 12/15/2012  . History of smoking 09/29/2011  . Medicare annual wellness visit, subsequent 07/15/2011  . Primary adenocarcinoma of prostate ([place])   . Cataract, nuclear sclerotic senile 05/01/2011  . Essential hypertension 06/29/2009  . IRRITABLE BOWEL SYNDROME 04/08/2008  . History of colonic polyps 06/08/2007  . Prediabetes 06/05/2007  . ESOPHAGITIS 06/05/2007  . 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 Mixed hyperlipidemia 06/04/2007    Past Surgical History:  Procedure Laterality Date  . CATARACT EXTRACTION Left 05/2017   Dr. Edilia Bo  . CATARACT EXTRACTION, BILATERAL  December 2016   [place], Dr. Raynelle Fanning  . COLONOSCOPY  03/01/01   Multiple polyps, divertic//adenomatous polyps  . COLONOSCOPY  04/24/01   multiple polyps  . COLONOSCOPY  10/23/01   Polyps benign-repeat every 2 years  . COLONOSCOPY  06/23/04   Adenom/hyperplastic polyps; multiple external hemorrhoids  . COLONOSCOPY  06/08/07   single small polyp-repeat 5 years  . ESOPHAGOGASTRODUODENOSCOPY  03/01/01   H.H.; gastritis esoph. dudodenitis  . NCS/Wrists Left Carpal  2/02   Min./right improved  . PFTs  10/2012   FVC 64%, FEV1 41%, ratio 0.62  . PROSTATE BIOPSY  12/04   positive; radioactive seed implant (Dr. Joelyn Oms)  . Prostate Ext Beam Radiation  1/27-07/23/03   Dr. Danny Lawless  . SPIROMETRY  09/2011   WNL  . Wrist Release  6/98; 9/99   Right  Prior to Admission medications   Medication Sig Start Date End Date Taking? Authorizing Provider  aspirin EC 81 MG tablet Take 81 mg by mouth daily.    [provider]  Calcium Carbonate-Vitamin D (CALCIUM 600/VITAMIN D) 600-400 MG-UNIT chew tablet Chew 1 tablet by mouth daily. 10/25/17   [name], MD  cephALEXin (KEFLEX) 500 MG capsule Take 1 capsule (500 mg total) by mouth 2 (two) times daily for 14 days. 11/24/17 12/08/17  Arta Silence, MD  ferrous sulfate 325 (65 FE)  MG tablet Take 1 tablet (325 mg total) by mouth daily with breakfast. 10/25/17   [name], MD  leuprolide (LUPRON) 22.5 MG injection Inject 22.5 mg into the muscle every 3 (three) months.    [provider]  lisinopril (PRINIVIL,ZESTRIL) 10 MG tablet Take 1 tablet (10 mg total) by mouth daily. 10/25/17   [name], MD  methylcellulose packet Take 1 each by mouth as needed for constipation. Reported on 11/05/2015    [provider]  omeprazole (PRILOSEC) 40 MG capsule Take 1 capsule (40 mg total) by mouth daily. 10/25/17   [name], MD  rosuvastatin (CRESTOR) 10 MG tablet Take 1 tablet (10 mg total) by mouth daily. 10/25/17   [name], MD  tamsulosin (FLOMAX) 0.4 MG CAPS capsule Take 1 capsule (0.4 mg total) by mouth daily after supper. 11/27/17   Merlyn Lot, MD  timolol (TIMOPTIC) 0.5 % ophthalmic solution  11/13/17   [provider]  venlafaxine XR (EFFEXOR-XR) 37.5 MG 24 hr capsule Take 1 capsule (37.5 mg total) by mouth daily with breakfast. 10/25/17   [name], MD    Allergies Patient has no known allergies.  Family History  Problem Relation Age of Onset  . Cancer Mother        colon  . Hyperlipidemia Mother   . Other Mother        Carotid disease  . Coronary artery disease Neg Hx   . Stroke Neg Hx     Social History Social History   Tobacco Use  . Smoking status: Former Smoker    Last attempt to quit: 05/23/1993    Years since quitting: 24.5  . Smokeless tobacco: Never Used  Substance Use Topics  . Alcohol use: Yes    Comment: Rare  . Drug use: No    Review of Systems  Constitutional: No fever/chills Eyes: No visual changes. ENT: No sore throat. Cardiovascular: Denies chest pain. Respiratory: Denies shortness of breath. Gastrointestinal: No abdominal pain.  No nausea, no vomiting.  No diarrhea.  No constipation. Genitourinary: Negative for dysuria. Musculoskeletal: Negative for back pain. Skin:  Negative for rash. Neurological: Negative for headaches, focal weakness   ____________________________________________   PHYSICAL EXAM:  VITAL SIGNS: ED Triage Vitals  Enc Vitals Group     BP 11/29/17 1342 (!) 77/46     Pulse Rate 11/29/17 1342 70     Resp 11/29/17 1342 18     Temp 11/29/17 1342 (!) 97.5 F (36.4 C)     Temp Source 11/29/17 1342 Oral     SpO2 11/29/17 1342 99 %     Weight 11/29/17 1353 144 lb (65.3 kg)     Height 11/29/17 1353 5\' 9"  (1.753 m)     Head Circumference --      Peak Flow --      Pain Score 11/29/17 1353 0     Pain Loc --      Pain Edu? --  Excl. in [place]? --     Constitutional: Alert and oriented. Well appearing and in no acute distress. Eyes: Conjunctivae are normal. PERRL. EOMI. Head: Atraumatic. Nose: No congestion/rhinnorhea. Mouth/Throat: Mucous membranes are moist.  Oropharynx non-erythematous. Neck: No stridor.   Cardiovascular: Normal rate, regular rhythm. Grossly normal heart sounds.  Good peripheral circulation. Respiratory: Normal respiratory effort.  No retractions. Lungs CTAB. Gastrointestinal: Soft and nontender. No distention. No abdominal bruits. No CVA tenderness. Musculoskeletal: No lower extremity tenderness nor edema.  No joint effusions. Neurologic:  Normal speech and language. No gross focal neurologic deficits are appreciated. No gait instability. Skin:  Skin is warm, dry and intact. No rash noted. Psychiatric: Mood and affect are normal. Speech and behavior are normal.  ____________________________________________   LABS (all labs ordered are listed, but only abnormal results are displayed)  Labs Reviewed  CBC - Abnormal; Notable for the following components:      Result Value   RBC 3.44 (*)    Hemoglobin 9.4 (*)    HCT 28.5 (*)    RDW 22.0 (*)    All other components within normal limits  BASIC METABOLIC PANEL - Abnormal; Notable for the following components:   CO2 21 (*)    Glucose, Bld 127 (*)    BUN 58  (*)    Creatinine, Ser 1.66 (*)    GFR calc non Af Amer 37 (*)    GFR calc Af Amer 42 (*)    All other components within normal limits  HEPATIC FUNCTION PANEL - Abnormal; Notable for the following components:   Albumin 3.4 (*)    All other components within normal limits  DIFFERENTIAL - Abnormal; Notable for the following components:   Neutro Abs 7.6 (*)    All other components within normal limits  TROPONIN I  URINALYSIS, COMPLETE (UACMP) WITH MICROSCOPIC  CBG MONITORING, ED   ____________________________________________  EKG  EKG read and interpreted by me shows normal sinus rhythm rate of 60 normal axis no acute ST-T wave changes ____________________________________________  RADIOLOGY  ED MD interpretation:    Official radiology report(s): No results found.  ____________________________________________   PROCEDURES  Procedure(s) performed:   Procedures  Critical Care performed:   ____________________________________________   INITIAL IMPRESSION / ASSESSMENT AND PLAN / ED COURSE Patient has been stable since I been seeing him.  I offered him admission he wants to go home.  He has been stable I will allow him to go home I will give him some Cipro here his wife will watch him and bring him back if there is any problem at all.   Clinical Course as of Nov 30 1606  Wed Nov 29, 2017  1422 Hepatic function panel [PM]  1422 Hepatic function panel [PM]  1422 Hepatic function panel [PM]  1422 Hepatic function panel [PM]  1422 Hepatic function panel [PM]  1458 MCHC: 33.0 [PM]    Clinical Course User Index [PM] Nena Polio, MD     ____________________________________________   FINAL CLINICAL IMPRESSION(S) / ED DIAGNOSES  Final diagnoses:  Dizziness  Urinary tract infection with hematuria, site unspecified     ED Discharge Orders    None       Note:  This document was prepared using Dragon voice recognition software and may include unintentional  dictation errors.    Nena Polio, MD 11/29/17 [phone]

## 2017-11-29 NOTE — ED Notes (Signed)
Pt ambulatory to wheel chair upon discharge. Verbalized understanding of discharge instructions, follow-up care and prescription. VSS. Skin warm and dry.

## 2017-11-29 NOTE — ED Notes (Signed)
RN attempted to recollect green [place] unsuccessfully. Lab contacted to complete recollect

## 2017-11-30 ENCOUNTER — Telehealth: Payer: Self-pay | Admitting: Urology

## 2017-11-30 NOTE — Telephone Encounter (Signed)
We took pt to ED yesterday due to low blood pressure.  He called to find out what steps he needs to take next.  I informed pt Thomas Lopez wasn't finished with notes in chart yet, which includes scheduling a Lasix renogram and he recommended a cystoscopy.  Pt didn't wish to schedule the cysto at this time.  He wanted to wait until [facility] was finished with note.

## 2017-12-01 ENCOUNTER — Telehealth: Payer: Self-pay | Admitting: Urology

## 2017-12-01 ENCOUNTER — Encounter: Payer: Self-pay | Admitting: Urology

## 2017-12-01 NOTE — Telephone Encounter (Signed)
-----   Message from Abbie Sons, MD sent at 12/01/2017  1:24 PM EDT ----- Office note completed.  I recommended scheduling Lasix renogram and cystoscopy.

## 2017-12-01 NOTE — Telephone Encounter (Signed)
Please call pt and have him scheduled for cystoscopy as well as lasix renogram, these are the patients next steps. Please document accordingly if pt refuses. Thank you

## 2017-12-01 NOTE — Telephone Encounter (Signed)
Follow up app made ° ° °Michelle °

## 2017-12-06 ENCOUNTER — Other Ambulatory Visit: Payer: Self-pay | Admitting: *Deleted

## 2017-12-06 ENCOUNTER — Inpatient Hospital Stay (HOSPITAL_BASED_OUTPATIENT_CLINIC_OR_DEPARTMENT_OTHER): Payer: Medicare PPO | Admitting: Internal Medicine

## 2017-12-06 ENCOUNTER — Inpatient Hospital Stay: Payer: Medicare PPO

## 2017-12-06 ENCOUNTER — Inpatient Hospital Stay: Payer: Medicare PPO | Attending: Internal Medicine

## 2017-12-06 ENCOUNTER — Other Ambulatory Visit: Payer: Self-pay

## 2017-12-06 ENCOUNTER — Encounter: Payer: Self-pay | Admitting: Internal Medicine

## 2017-12-06 VITALS — BP 101/61 | HR 61 | Temp 96.5°F | Resp 18 | Wt 138.6 lb

## 2017-12-06 DIAGNOSIS — R42 Dizziness and giddiness: Secondary | ICD-10-CM | POA: Diagnosis not present

## 2017-12-06 DIAGNOSIS — C61 Malignant neoplasm of prostate: Secondary | ICD-10-CM

## 2017-12-06 DIAGNOSIS — R0602 Shortness of breath: Secondary | ICD-10-CM | POA: Insufficient documentation

## 2017-12-06 DIAGNOSIS — Z79899 Other long term (current) drug therapy: Secondary | ICD-10-CM | POA: Insufficient documentation

## 2017-12-06 DIAGNOSIS — K219 Gastro-esophageal reflux disease without esophagitis: Secondary | ICD-10-CM | POA: Diagnosis not present

## 2017-12-06 DIAGNOSIS — Z800 Family history of malignant neoplasm of digestive organs: Secondary | ICD-10-CM

## 2017-12-06 DIAGNOSIS — R5381 Other malaise: Secondary | ICD-10-CM | POA: Diagnosis not present

## 2017-12-06 DIAGNOSIS — Z79818 Long term (current) use of other agents affecting estrogen receptors and estrogen levels: Secondary | ICD-10-CM | POA: Diagnosis not present

## 2017-12-06 DIAGNOSIS — I10 Essential (primary) hypertension: Secondary | ICD-10-CM

## 2017-12-06 DIAGNOSIS — H409 Unspecified glaucoma: Secondary | ICD-10-CM | POA: Insufficient documentation

## 2017-12-06 DIAGNOSIS — Z87891 Personal history of nicotine dependence: Secondary | ICD-10-CM

## 2017-12-06 DIAGNOSIS — E785 Hyperlipidemia, unspecified: Secondary | ICD-10-CM

## 2017-12-06 DIAGNOSIS — Z7982 Long term (current) use of aspirin: Secondary | ICD-10-CM | POA: Diagnosis not present

## 2017-12-06 DIAGNOSIS — D649 Anemia, unspecified: Secondary | ICD-10-CM

## 2017-12-06 DIAGNOSIS — R5383 Other fatigue: Secondary | ICD-10-CM | POA: Diagnosis not present

## 2017-12-06 DIAGNOSIS — R634 Abnormal weight loss: Secondary | ICD-10-CM

## 2017-12-06 DIAGNOSIS — N1330 Unspecified hydronephrosis: Secondary | ICD-10-CM | POA: Insufficient documentation

## 2017-12-06 LAB — CBC WITH DIFFERENTIAL/PLATELET
Basophils Absolute: 0.1 10*3/uL (ref 0–0.1)
Basophils Relative: 1 %
EOS PCT: 2 %
Eosinophils Absolute: 0.1 10*3/uL (ref 0–0.7)
HCT: 24 % — ABNORMAL LOW (ref 40.0–52.0)
Hemoglobin: 7.8 g/dL — ABNORMAL LOW (ref 13.0–18.0)
LYMPHS ABS: 0.9 10*3/uL — AB (ref 1.0–3.6)
LYMPHS PCT: 16 %
MCH: 27.1 pg (ref 26.0–34.0)
MCHC: 32.4 g/dL (ref 32.0–36.0)
MCV: 83.7 fL (ref 80.0–100.0)
MONO ABS: 0.3 10*3/uL (ref 0.2–1.0)
Monocytes Relative: 5 %
Neutro Abs: 4.3 10*3/uL (ref 1.4–6.5)
Neutrophils Relative %: 76 %
PLATELETS: 231 10*3/uL (ref 150–440)
RBC: 2.87 MIL/uL — ABNORMAL LOW (ref 4.40–5.90)
RDW: 22.6 % — AB (ref 11.5–14.5)
WBC: 5.6 10*3/uL (ref 3.8–10.6)

## 2017-12-06 LAB — SAMPLE TO BLOOD BANK

## 2017-12-06 LAB — PREPARE RBC (CROSSMATCH)

## 2017-12-06 MED ORDER — SODIUM CHLORIDE 0.9 % IV SOLN
250.0000 mL | Freq: Once | INTRAVENOUS | Status: AC
  Administered 2017-12-06: 250 mL via INTRAVENOUS
  Filled 2017-12-06: qty 250

## 2017-12-06 MED ORDER — ACETAMINOPHEN 325 MG PO TABS
650.0000 mg | ORAL_TABLET | Freq: Once | ORAL | Status: AC
  Administered 2017-12-06: 650 mg via ORAL
  Filled 2017-12-06: qty 2

## 2017-12-06 MED ORDER — DIPHENHYDRAMINE HCL 25 MG PO CAPS
25.0000 mg | ORAL_CAPSULE | Freq: Once | ORAL | Status: AC
  Administered 2017-12-06: 25 mg via ORAL
  Filled 2017-12-06: qty 1

## 2017-12-06 NOTE — Progress Notes (Signed)
Patient here for follow up. Patient states that he was feeling fine when he woke up, but when he stepped outside he started to feel "blurry."

## 2017-12-06 NOTE — Progress Notes (Signed)
[place] OFFICE PROGRESS NOTE  Patient Care Team: [name], MD as PCP - General (Family Medicine) [place], Tracie Harrier, MD as Consulting Physician (Ophthalmology)  Cancer Staging No matching staging information was found for the patient.   Oncology History   # external beam radiation therapy and radiation seed implant in 2005 with Dr. Joelyn Oms and Dr. Danny Lawless for T1c Gleason 3+4 = 7 prostate cancer with a pretreatment PSA of 6.8. In November 2015, he was found to have biochemical recurrence.  Dec 2016- CT a/p & Bone scan- NEG; PSA- 11; June 2017- 19; STARTED on Firmagon [Dr.Budzyn]; on Lupron q 65M  # OCT 2018- BMBx- hypercellular 30%; relative myeloid hyperplasia/erythroid hypoplasia; FISH- 5/7/8-Normal. II OPINION at [facility].   # JAN 2018- Dexa scan- N     Primary adenocarcinoma of prostate ([place])      INTERVAL HISTORY:  Thomas Lopez 84 y.o.  male pleasant patient above history of unexplained anemia-needing PRBC transfusion every 3 to 4 weeks is here for follow-up.  Patient was recently evaluated by urology-for urinary retention/bilateral hydronephrosis patient is currently status post Foley catheter.  He is currently awaiting follow-up  Patient complains of dizziness and fatigue.  Worsening shortness of with exertion.  No cough.  Review of Systems  Constitutional: Positive for malaise/fatigue and weight loss. Negative for chills, diaphoresis and fever.  HENT: Negative for nosebleeds and sore throat.   Eyes: Negative for double vision.  Respiratory: Positive for shortness of breath. Negative for cough, hemoptysis, sputum production and wheezing.   Cardiovascular: Negative for chest pain, palpitations, orthopnea and leg swelling.  Gastrointestinal: Negative for abdominal pain, blood in stool, constipation, diarrhea, heartburn, melena, nausea and vomiting.  Genitourinary: Negative for dysuria, frequency and urgency.  Musculoskeletal: Negative for back pain  and joint pain.  Skin: Negative.  Negative for itching and rash.  Neurological: Positive for dizziness. Negative for tingling, focal weakness, weakness and headaches.  Endo/Heme/Allergies: Does not bruise/bleed easily.  Psychiatric/Behavioral: Negative for depression. The patient is not nervous/anxious and does not have insomnia.       PAST MEDICAL HISTORY :  Past Medical History:  Diagnosis Date  . Aorto-iliac atherosclerosis ([place]) 04/2015   by CT scan  . Carotid stenosis 11/2014   mild bilateral 1-39%, rpt 2 yrs  . Colon polyps    rpt colonoscopy due 2014  . ED (erectile dysfunction)    Trimix and VED  . Esophagitis   . Essential hypertension 06/29/2009  . GERD (gastroesophageal reflux disease)   . Glaucoma    severe ([place])  . HLD (hyperlipidemia)   . HTN (hypertension)   . Hyperglycemia   . IBS (irritable bowel syndrome)   . Prostate cancer [facility]) 2005   s/p seed implant, EBRT (Dr. Alinda Money at [facility]) recurrent treating with androgen deprivation referred to cancer center  . Smoking history     PAST SURGICAL HISTORY :   Past Surgical History:  Procedure Laterality Date  . CATARACT EXTRACTION Left 05/2017   Dr. Edilia Bo  . CATARACT EXTRACTION, BILATERAL  December 2016   [place], Dr. Raynelle Fanning  . COLONOSCOPY  03/01/01   Multiple polyps, divertic//adenomatous polyps  . COLONOSCOPY  04/24/01   multiple polyps  . COLONOSCOPY  10/23/01   Polyps benign-repeat every 2 years  . COLONOSCOPY  06/23/04   [place]om/hyperplastic polyps; multiple external hemorrhoids  . COLONOSCOPY  06/08/07   single small polyp-repeat 5 years  . ESOPHAGOGASTRODUODENOSCOPY  03/01/01   H.H.; gastritis esoph. dudodenitis  .  NCS/Wrists Left Carpal  2/02   Min./right improved  . PFTs  10/2012   FVC 64%, FEV1 41%, ratio 0.62  . PROSTATE BIOPSY  12/04   positive; radioactive seed implant (Dr. Joelyn Oms)  . Prostate Ext Beam Radiation  1/27-07/23/03   Dr. Danny Lawless  . SPIROMETRY  09/2011   WNL  .  Wrist Release  6/98; 9/99   Right    FAMILY HISTORY :   Family History  Problem Relation Age of Onset  . Cancer Mother        colon  . Hyperlipidemia Mother   . Other Mother        Carotid disease  . Coronary artery disease Neg Hx   . Stroke Neg Hx     SOCIAL HISTORY:   Social History   Tobacco Use  . Smoking status: Former Smoker    Last attempt to quit: 05/23/1993    Years since quitting: 24.5  . Smokeless tobacco: Never Used  Substance Use Topics  . Alcohol use: Yes    Comment: Rare  . Drug use: No    ALLERGIES:  has No Known Allergies.  MEDICATIONS:  Current Outpatient Medications  Medication Sig Dispense Refill  . aspirin EC 81 MG tablet Take 81 mg by mouth daily.    . Calcium Carbonate-Vitamin D (CALCIUM 600/VITAMIN D) 600-400 MG-UNIT chew tablet Chew 1 tablet by mouth daily.    . cephALEXin (KEFLEX) 500 MG capsule Take 1 capsule (500 mg total) by mouth 2 (two) times daily for 14 days. 28 capsule 0  . ciprofloxacin (CIPRO) 500 MG tablet Take 1 tablet (500 mg total) by mouth 2 (two) times daily for 10 days. 19 tablet 0  . ferrous sulfate 325 (65 FE) MG tablet Take 1 tablet (325 mg total) by mouth daily with breakfast.  3  . leuprolide (LUPRON) 22.5 MG injection Inject 22.5 mg into the muscle every 3 (three) months.    Marland Kitchen lisinopril (PRINIVIL,ZESTRIL) 10 MG tablet Take 1 tablet (10 mg total) by mouth daily. 90 tablet 3  . methylcellulose packet Take 1 each by mouth as needed for constipation. Reported on 11/05/2015    . omeprazole (PRILOSEC) 40 MG capsule Take 1 capsule (40 mg total) by mouth daily. 90 capsule 3  . rosuvastatin (CRESTOR) 10 MG tablet Take 1 tablet (10 mg total) by mouth daily. 90 tablet 3  . tamsulosin (FLOMAX) 0.4 MG CAPS capsule Take 1 capsule (0.4 mg total) by mouth daily after supper. 20 capsule 0  . venlafaxine XR (EFFEXOR-XR) 37.5 MG 24 hr capsule Take 1 capsule (37.5 mg total) by mouth daily with breakfast. 90 capsule 3  . timolol (TIMOPTIC) 0.5  % ophthalmic solution      No current facility-administered medications for this visit.     PHYSICAL EXAMINATION: ECOG PERFORMANCE STATUS: 1 - Symptomatic but completely ambulatory  BP 101/61 (BP Location: Left Arm, Patient Position: Sitting)   Pulse 61   Temp (!) 96.5 F (35.8 C) (Tympanic)   Resp 18   Wt 138 lb 9.6 oz (62.9 kg)   BMI 20.47 kg/m   Filed Weights   12/06/17 0956  Weight: 138 lb 9.6 oz (62.9 kg)    GENERAL: Well-nourished well-developed; Alert, no distress and comfortable.  Accompanied by his wife.  He is in a wheelchair. EYES: Positive for pallor; no icterus. OROPHARYNX: no thrush or ulceration; NECK: supple; no lymph nodes felt. LYMPH:  no palpable lymphadenopathy in the axillary or inguinal regions LUNGS: Decreased breath sounds  auscultation bilaterally. No wheeze or crackles HEART/CVS: regular rate & rhythm and no murmurs; No lower extremity edema ABDOMEN:abdomen soft, non-tender and normal bowel sounds. No hepatomegaly or splenomegaly.  Musculoskeletal:no cyanosis of digits and no clubbing  PSYCH: alert & oriented x 3 with fluent speech NEURO: no focal motor/sensory deficits SKIN:  no rashes or significant lesions    LABORATORY DATA:  I have reviewed the data as listed    Component Value Date/Time   NA 138 11/29/2017 1506   K 4.5 11/29/2017 1506   CL 109 11/29/2017 1506   CO2 21 (L) 11/29/2017 1506   GLUCOSE 127 (H) 11/29/2017 1506   BUN 58 (H) 11/29/2017 1506   CREATININE 1.66 (H) 11/29/2017 1506   CALCIUM 9.5 11/29/2017 1506   PROT 7.9 11/29/2017 1506   ALBUMIN 3.4 (L) 11/29/2017 1506   AST 22 11/29/2017 1506   ALT 13 11/29/2017 1506   ALKPHOS 53 11/29/2017 1506   BILITOT 0.3 11/29/2017 1506   GFRNONAA 37 (L) 11/29/2017 1506   GFRAA 42 (L) 11/29/2017 1506    No results found for: SPEP, UPEP  Lab Results  Component Value Date   WBC 5.6 12/06/2017   NEUTROABS 4.3 12/06/2017   HGB 7.8 (L) 12/06/2017   HCT 24.0 (L) 12/06/2017   MCV  83.7 12/06/2017   PLT 231 12/06/2017      Chemistry      Component Value Date/Time   NA 138 11/29/2017 1506   K 4.5 11/29/2017 1506   CL 109 11/29/2017 1506   CO2 21 (L) 11/29/2017 1506   BUN 58 (H) 11/29/2017 1506   CREATININE 1.66 (H) 11/29/2017 1506      Component Value Date/Time   CALCIUM 9.5 11/29/2017 1506   ALKPHOS 53 11/29/2017 1506   AST 22 11/29/2017 1506   ALT 13 11/29/2017 1506   BILITOT 0.3 11/29/2017 1506       RADIOGRAPHIC STUDIES: I have personally reviewed the radiological images as listed and agreed with the findings in the report. No results found.   ASSESSMENT & PLAN:  Normocytic anemia #Normocytic anemia-unclear etiology status post bone marrow biopsy; worsened; today hemoglobin 7.8; patient symptomatic.  Proceed with PRBC transfusion 1 unit.   #Will check regarding foundation 1 heme testing  #Bilateral hydro-nephrosis /hydroureter unclear etiology question bladder neck obstruction; new  creat 1.4 s/p Foley- awiating follow up with Urology.    #Dizzy spells-blood pressure normal/borderline low; recommend holding of lisinopril/also with renal insufficiency.  # Metastatic prostate cancer Hormone sensitive- biochemical recurrence [M0]-  [on Lupron 09/20/2017] -stable  # Prostatism symtoms-on flomax; s/p foley as above.   #Follow up in approximately 4 weeks/labs-cbc/bmp HOLD tube/ possible PRBC transfusion.    Orders Placed This Encounter  Procedures  . CBC with Differential    Standing Status:   Future    Standing Expiration Date:   12/07/2018  . Basic metabolic panel    Standing Status:   Future    Standing Expiration Date:   12/07/2018  . Hold Tube- Blood Bank    Standing Status:   Future    Standing Expiration Date:   12/07/2018   All questions were answered. The patient knows to call the clinic with any problems, questions or concerns.      Cammie Sickle, MD 12/06/2017 12:41 PM

## 2017-12-06 NOTE — Patient Instructions (Signed)
STOP taking lisinopril [blood pressure medication] as you are feeling dizzy.

## 2017-12-06 NOTE — Assessment & Plan Note (Addendum)
#  Normocytic anemia-unclear etiology status post bone marrow biopsy; worsened; today hemoglobin 7.8; patient symptomatic.  Proceed with PRBC transfusion 1 unit.   #Will check regarding foundation 1 heme testing  #Bilateral hydro-nephrosis /hydroureter unclear etiology question bladder neck obstruction; new  creat 1.4 s/p Foley- awiating follow up with Urology.    #Dizzy spells-blood pressure normal/borderline low; recommend holding of lisinopril/also with renal insufficiency.  # Metastatic prostate cancer Hormone sensitive- biochemical recurrence [M0]-  [on Lupron 09/20/2017] -stable  # Prostatism symtoms-on flomax; s/p foley as above.   #Follow up in approximately 4 weeks/labs-cbc/bmp HOLD tube/ possible PRBC transfusion.

## 2017-12-07 LAB — TYPE AND SCREEN
ABO/RH(D): A POS
ANTIBODY SCREEN: NEGATIVE
UNIT DIVISION: 0

## 2017-12-07 LAB — BPAM RBC
BLOOD PRODUCT EXPIRATION DATE: 201907212359
ISSUE DATE / TIME: 201907171120
UNIT TYPE AND RH: 600

## 2017-12-22 ENCOUNTER — Encounter: Payer: Self-pay | Admitting: Family Medicine

## 2017-12-22 ENCOUNTER — Ambulatory Visit (INDEPENDENT_AMBULATORY_CARE_PROVIDER_SITE_OTHER): Payer: Medicare PPO | Admitting: Family Medicine

## 2017-12-22 VITALS — BP 104/60 | HR 68 | Temp 97.8°F | Ht 69.0 in | Wt 135.8 lb

## 2017-12-22 DIAGNOSIS — R778 Other specified abnormalities of plasma proteins: Secondary | ICD-10-CM

## 2017-12-22 DIAGNOSIS — I959 Hypotension, unspecified: Secondary | ICD-10-CM | POA: Diagnosis not present

## 2017-12-22 DIAGNOSIS — N289 Disorder of kidney and ureter, unspecified: Secondary | ICD-10-CM

## 2017-12-22 DIAGNOSIS — R232 Flushing: Secondary | ICD-10-CM

## 2017-12-22 DIAGNOSIS — R339 Retention of urine, unspecified: Secondary | ICD-10-CM | POA: Diagnosis not present

## 2017-12-22 DIAGNOSIS — R634 Abnormal weight loss: Secondary | ICD-10-CM

## 2017-12-22 DIAGNOSIS — D649 Anemia, unspecified: Secondary | ICD-10-CM

## 2017-12-22 NOTE — Progress Notes (Signed)
BP 104/60 (BP Location: Left Arm, Patient Position: Sitting, Cuff Size: Normal)   Pulse 68   Temp 97.8 F (36.6 C) (Oral)   Ht 5' 9"  (1.753 m)   Wt 135 lb 12 oz (61.6 kg)   SpO2 98%   BMI 20.05 kg/m    CC: hosp f/u visit Subjective:    Patient ID: Thomas Lopez, male    DOB: Apr 30, 1933, 84 y.o.   MRN: [id number]  HPI: Thomas Lopez is a 84 y.o. male presenting on 12/22/2017 for Hospitalization Follow-up (Pt seen at [facility] on 11/29/17, primary dx dizziness.)   Recent [facility] ER visits x2 last month for dizziness and lightheadedness. Records reviewed. Marked hypotension recently - lisinopril was stopped.   Evaluated by urology for urinary retention and bilateral hydronephrosis with foley in place - persistent moderate L hydronephrosis and mild R hydronephrosis despite catheter. Thought chronic. Planned lasix renogram with cystoscopy as next step - this is scheduled for Tuesday.   Sees heme/onc for metastatic hormone sensitive prostate cancer with biochemical recurrence on lupron - seems stable. Normocytic anemia of unclear etiology despite bone marrow biopsy 02/2017. He did receive 1u pRBC last month for progressive symptomatic anemia. iFOB was negative. High ferritin noted. Ongoing weight loss. SPEP showed abnormal faint protein band in gamma globulins thought to represent circulating immune complexes (chronic inflammatory pattern) without M spike. Immunofixation showed a faint IgA (lambda) monoclonal Ig with a poorly defined region of restricted protein mobility reactive with lambda light chain antisera.   2nd opinion at [facility] for normocytic anemia - records reviewed, ?GCA. I touched base with heme/onc - reasonable for rheumatology eval. Denies headaches, joint pain or swelling or redness/warmth, or new rashes. Occasional blurry vision.  Lab Results  Component Value Date   ESRSEDRATE 95 (H) 10/25/2017    Effexor started 02/2016 for hot flashes on lupron. Pt endorses occasional chills.  Ongoing weight loss.   Relevant past medical, surgical, family and social history reviewed and updated as indicated. Interim medical history since our last visit reviewed. Allergies and medications reviewed and updated. Outpatient Medications Prior to Visit  Medication Sig Dispense Refill  . aspirin EC 81 MG tablet Take 81 mg by mouth daily.    . Calcium Carbonate-Vitamin D (CALCIUM 600/VITAMIN D) 600-400 MG-UNIT chew tablet Chew 1 tablet by mouth daily.    . ferrous sulfate 325 (65 FE) MG tablet Take 1 tablet (325 mg total) by mouth daily with breakfast.  3  . leuprolide (LUPRON) 22.5 MG injection Inject 22.5 mg into the muscle every 3 (three) months.    . methylcellulose packet Take 1 each by mouth as needed for constipation. Reported on 11/05/2015    . omeprazole (PRILOSEC) 40 MG capsule Take 1 capsule (40 mg total) by mouth daily. 90 capsule 3  . rosuvastatin (CRESTOR) 10 MG tablet Take 1 tablet (10 mg total) by mouth daily. 90 tablet 3  . tamsulosin (FLOMAX) 0.4 MG CAPS capsule Take 1 capsule (0.4 mg total) by mouth daily after supper. 20 capsule 0  . venlafaxine XR (EFFEXOR-XR) 37.5 MG 24 hr capsule Take 1 capsule (37.5 mg total) by mouth daily with breakfast. 90 capsule 3  . lisinopril (PRINIVIL,ZESTRIL) 10 MG tablet Take 1 tablet (10 mg total) by mouth daily. 90 tablet 3  . timolol (TIMOPTIC) 0.5 % ophthalmic solution      No facility-administered medications prior to visit.      Per HPI unless specifically indicated in ROS section below Review of Systems  Objective:    BP 104/60 (BP Location: Left Arm, Patient Position: Sitting, Cuff Size: Normal)   Pulse 68   Temp 97.8 F (36.6 C) (Oral)   Ht 5' 9"  (1.753 m)   Wt 135 lb 12 oz (61.6 kg)   SpO2 98%   BMI 20.05 kg/m   Wt Readings from Last 3 Encounters:  12/22/17 135 lb 12 oz (61.6 kg)  12/06/17 138 lb 9.6 oz (62.9 kg)  11/29/17 144 lb (65.3 kg)    Physical Exam  Constitutional: He appears well-developed and  well-nourished. No distress.  HENT:  Mouth/Throat: Oropharynx is clear and moist. No oropharyngeal exudate.  Cardiovascular: Normal rate, regular rhythm and normal heart sounds.  No murmur heard. Pulmonary/Chest: Effort normal and breath sounds normal. No respiratory distress. He has no wheezes.  Musculoskeletal: He exhibits no edema.  Skin: Skin is warm and dry. No rash noted.  Psychiatric: He has a normal mood and affect.  Nursing note and vitals reviewed.  Lab Results  Component Value Date   CREATININE 1.66 (H) 11/29/2017   BUN 58 (H) 11/29/2017   NA 138 11/29/2017   K 4.5 11/29/2017   CL 109 11/29/2017   CO2 21 (L) 11/29/2017    Lab Results  Component Value Date   WBC 5.6 12/06/2017   HGB 7.8 (L) 12/06/2017   HCT 24.0 (L) 12/06/2017   MCV 83.7 12/06/2017   PLT 231 12/06/2017      Assessment & Plan:   Problem List Items Addressed This Visit    Urinary retention    Unclear etiology, followed by urology. Foley catheter in place.  Pending cystoscopy to evaluate bladder outlet.      Renal insufficiency    New over the last 8 months (Cr 1.09 10/2/018) SPEP without M spike.  Will keep renal insufficiency in mind when interpreting FLC       Relevant Orders   Ambulatory referral to Rheumatology   Normocytic anemia    S/p 1u pRBC transfusion last month.       Relevant Orders   Ambulatory referral to Rheumatology   Hypotension    H/o HTN on lisinopril, more recently with dizziness and hypotension. Advised stay off lisinopril at this time. flomax was started last month - if ongoing dizziness despite stopping ACEI, advised he hold flomax as well. Encouraged good hydration status.       Hot flashes    lupron effect. Effexor started 02/2016.       Abnormal weight loss - Primary    Ongoing, but overall stable. Associated normocytic anemia, malaise/fatigue with still unclear etiology despite bone marrow biopsy last year. ?lupron effect. Elevated ferrritin and ESR, and  circulating immune complexes by SPEP. After discussing with heme, will ask for rheumatological evaluation to see if possible autoimmune condition contributing to symptoms. Pt agrees with plan.       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  No bone lesions on CTs this year (head, chest, abd/pelvis).       Relevant Orders   Kappa/lambda light chains       No orders of the defined types were placed in this encounter.  Orders Placed This Encounter  Procedures  . Kappa/lambda light chains  . Ambulatory referral to Rheumatology    Referral Priority:   Routine  Referral Type:   Consultation    Referral Reason:   Specialty Services Required    Requested Specialty:   Rheumatology    Number of Visits Requested:   1    Follow up plan: Return in about 3 months (around 03/24/2018) for follow up visit.  [name], MD

## 2017-12-22 NOTE — Patient Instructions (Addendum)
Stay off lisinopril.  Consider stopping flomax if ongoing dizziness upon standing.  Keep follow up with Dr Bernardo Heater.  Lab test today - we may refer you to rheumatology for ongoing weight loss, fatigue, and elevated inflammatory markers. Return to see me in 3 months.

## 2017-12-23 DIAGNOSIS — I959 Hypotension, unspecified: Secondary | ICD-10-CM | POA: Insufficient documentation

## 2017-12-23 DIAGNOSIS — R778 Other specified abnormalities of plasma proteins: Secondary | ICD-10-CM | POA: Insufficient documentation

## 2017-12-23 DIAGNOSIS — N183 Chronic kidney disease, stage 3 unspecified: Secondary | ICD-10-CM | POA: Insufficient documentation

## 2017-12-23 NOTE — Assessment & Plan Note (Addendum)
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  No bone lesions on CTs this year (head, chest, abd/pelvis).

## 2017-12-23 NOTE — Assessment & Plan Note (Signed)
H/o HTN on lisinopril, more recently with dizziness and hypotension. Advised stay off lisinopril at this time. flomax was started last month - if ongoing dizziness despite stopping ACEI, advised he hold flomax as well. Encouraged good hydration status.

## 2017-12-23 NOTE — Assessment & Plan Note (Addendum)
Unclear etiology, followed by urology. Foley catheter in place.  Pending cystoscopy to evaluate bladder outlet.

## 2017-12-23 NOTE — Assessment & Plan Note (Signed)
S/p 1u pRBC transfusion last month.

## 2017-12-23 NOTE — Assessment & Plan Note (Signed)
Ongoing, but overall stable. Associated normocytic anemia, malaise/fatigue with still unclear etiology despite bone marrow biopsy last year. ?lupron effect. Elevated ferrritin and ESR, and circulating immune complexes by SPEP. After discussing with heme, will ask for rheumatological evaluation to see if possible autoimmune condition contributing to symptoms. Pt agrees with plan.

## 2017-12-23 NOTE — Assessment & Plan Note (Signed)
lupron effect. Effexor started 02/2016.

## 2017-12-23 NOTE — Assessment & Plan Note (Signed)
New over the last 8 months (Cr 1.09 10/2/018) SPEP without M spike.  Will keep renal insufficiency in mind when interpreting FLC

## 2017-12-26 LAB — KAPPA/LAMBDA LIGHT CHAINS
Kappa free light chain: 40.6 mg/L — ABNORMAL HIGH (ref 3.3–19.4)
Kappa:Lambda Ratio: 1.43 (ref 0.26–1.65)
Lambda Free Lght Chn: 28.3 mg/L — ABNORMAL HIGH (ref 5.7–26.3)

## 2017-12-27 ENCOUNTER — Ambulatory Visit (INDEPENDENT_AMBULATORY_CARE_PROVIDER_SITE_OTHER): Payer: Medicare PPO

## 2017-12-27 DIAGNOSIS — R339 Retention of urine, unspecified: Secondary | ICD-10-CM

## 2017-12-27 NOTE — Progress Notes (Signed)
Pt present in office today for nurse visit. He is returning for a 1 month catheter change. Pt requesting to have a trial void at this time. Per Dr. Bernardo Heater, unlikely pt will void, however he is willing to allow pt to try.   Catheter Removal  Patient is present today for a catheter removal.  11ml of water was drained from the balloon. A 14FR foley cath was removed from the bladder no complications were noted . Patient tolerated well.  Preformed by: Cristie Hem, CMA  Follow up/ Additional notes: Return at 1400 for PVR.   Pt present this afternoon for PVR after removing catheter this morning. Pt states he has urinated a couple of times at home. He was unable to go in office and PVR indicated 364ml in bladder. Per Dr Bernardo Heater, pt was instructed on CIC and went home with a box of 14FR Coude Flex Pro. He demonstrated in office his ability to cath himself. Will call if any trouble.    Bladder Scan Patient cannot void: 300 ml Performed By: Cristie Hem, CMA

## 2017-12-28 ENCOUNTER — Encounter
Admission: RE | Admit: 2017-12-28 | Discharge: 2017-12-28 | Disposition: A | Discharge status: Home / Self Care | Payer: Medicare PPO | Source: Ambulatory Visit | Attending: Urology | Admitting: Urology

## 2017-12-28 DIAGNOSIS — N1330 Unspecified hydronephrosis: Secondary | ICD-10-CM | POA: Diagnosis not present

## 2017-12-28 DIAGNOSIS — N1339 Other hydronephrosis: Secondary | ICD-10-CM | POA: Diagnosis not present

## 2017-12-28 MED ORDER — FUROSEMIDE 10 MG/ML IJ SOLN
30.7000 mg | Freq: Once | INTRAMUSCULAR | Status: DC
  Filled 2017-12-28: qty 3.1

## 2017-12-28 MED ORDER — TECHNETIUM TC 99M MERTIATIDE
4.8620 | Freq: Once | INTRAVENOUS | Status: AC | PRN
  Administered 2017-12-28: 4.862 via INTRAVENOUS

## 2018-01-03 ENCOUNTER — Inpatient Hospital Stay: Payer: Medicare PPO

## 2018-01-03 ENCOUNTER — Inpatient Hospital Stay (HOSPITAL_BASED_OUTPATIENT_CLINIC_OR_DEPARTMENT_OTHER): Payer: Medicare PPO | Admitting: Internal Medicine

## 2018-01-03 ENCOUNTER — Inpatient Hospital Stay: Payer: Medicare PPO | Attending: Internal Medicine

## 2018-01-03 ENCOUNTER — Encounter: Payer: Self-pay | Admitting: Internal Medicine

## 2018-01-03 VITALS — BP 102/56 | HR 60 | Temp 97.6°F | Resp 20 | Ht 69.0 in | Wt 138.8 lb

## 2018-01-03 DIAGNOSIS — Z923 Personal history of irradiation: Secondary | ICD-10-CM | POA: Diagnosis not present

## 2018-01-03 DIAGNOSIS — N289 Disorder of kidney and ureter, unspecified: Secondary | ICD-10-CM

## 2018-01-03 DIAGNOSIS — C61 Malignant neoplasm of prostate: Secondary | ICD-10-CM | POA: Insufficient documentation

## 2018-01-03 DIAGNOSIS — E785 Hyperlipidemia, unspecified: Secondary | ICD-10-CM | POA: Diagnosis not present

## 2018-01-03 DIAGNOSIS — D649 Anemia, unspecified: Secondary | ICD-10-CM | POA: Diagnosis not present

## 2018-01-03 DIAGNOSIS — R0609 Other forms of dyspnea: Secondary | ICD-10-CM

## 2018-01-03 DIAGNOSIS — Z79818 Long term (current) use of other agents affecting estrogen receptors and estrogen levels: Secondary | ICD-10-CM | POA: Diagnosis not present

## 2018-01-03 DIAGNOSIS — R634 Abnormal weight loss: Secondary | ICD-10-CM | POA: Diagnosis not present

## 2018-01-03 DIAGNOSIS — I10 Essential (primary) hypertension: Secondary | ICD-10-CM

## 2018-01-03 DIAGNOSIS — R42 Dizziness and giddiness: Secondary | ICD-10-CM

## 2018-01-03 DIAGNOSIS — Z87891 Personal history of nicotine dependence: Secondary | ICD-10-CM | POA: Insufficient documentation

## 2018-01-03 DIAGNOSIS — Z7982 Long term (current) use of aspirin: Secondary | ICD-10-CM

## 2018-01-03 DIAGNOSIS — R5381 Other malaise: Secondary | ICD-10-CM | POA: Insufficient documentation

## 2018-01-03 DIAGNOSIS — R531 Weakness: Secondary | ICD-10-CM

## 2018-01-03 DIAGNOSIS — K219 Gastro-esophageal reflux disease without esophagitis: Secondary | ICD-10-CM | POA: Insufficient documentation

## 2018-01-03 DIAGNOSIS — Z79899 Other long term (current) drug therapy: Secondary | ICD-10-CM | POA: Insufficient documentation

## 2018-01-03 DIAGNOSIS — R5383 Other fatigue: Secondary | ICD-10-CM | POA: Diagnosis not present

## 2018-01-03 LAB — SAMPLE TO BLOOD BANK

## 2018-01-03 LAB — CBC WITH DIFFERENTIAL/PLATELET
BASOS PCT: 1 %
Basophils Absolute: 0 10*3/uL (ref 0–0.1)
EOS ABS: 0.1 10*3/uL (ref 0–0.7)
EOS PCT: 2 %
HCT: 25.5 % — ABNORMAL LOW (ref 40.0–52.0)
HEMOGLOBIN: 8.1 g/dL — AB (ref 13.0–18.0)
LYMPHS ABS: 0.8 10*3/uL — AB (ref 1.0–3.6)
Lymphocytes Relative: 14 %
MCH: 27.7 pg (ref 26.0–34.0)
MCHC: 32 g/dL (ref 32.0–36.0)
MCV: 86.6 fL (ref 80.0–100.0)
MONOS PCT: 4 %
Monocytes Absolute: 0.2 10*3/uL (ref 0.2–1.0)
NEUTROS PCT: 79 %
Neutro Abs: 4.4 10*3/uL (ref 1.4–6.5)
PLATELETS: 209 10*3/uL (ref 150–440)
RBC: 2.94 MIL/uL — ABNORMAL LOW (ref 4.40–5.90)
RDW: 19.7 % — ABNORMAL HIGH (ref 11.5–14.5)
WBC: 5.5 10*3/uL (ref 3.8–10.6)

## 2018-01-03 NOTE — Progress Notes (Signed)
Patient has had foley cath removed since last visit. Self caths at home.

## 2018-01-03 NOTE — Progress Notes (Signed)
[place] OFFICE PROGRESS NOTE  Patient Care Team: [name], MD as PCP - General (Family Medicine) Bond, Tracie Harrier, MD as Consulting Physician (Ophthalmology)  Cancer Staging No matching staging information was found for the patient.   Oncology History   # external beam radiation therapy and radiation seed implant in 2005 with Dr. Joelyn Oms and Dr. Danny Lawless for T1c Gleason 3+4 = 7 prostate cancer with a pretreatment PSA of 6.8. In November 2015, he was found to have biochemical recurrence.  Dec 2016- CT a/p & Bone scan- NEG; PSA- 11; June 2017- 19; STARTED on Firmagon [Dr.Budzyn]; on Lupron q 58M  # OCT 2018- BMBx- hypercellular 30%; relative myeloid hyperplasia/erythroid hypoplasia; FISH- 5/7/8-Normal. II OPINION at [facility].   # JAN 2018- Dexa scan- N     Primary adenocarcinoma of prostate ([place])      INTERVAL HISTORY:  Thomas Lopez 84 y.o.  male pleasant patient above history of anemia of unclear etiology is here for follow-up.  Patient complains of worsening fatigue/shortness of breath with exertion.  Continues to have weight loss.  Denies any blood in stools black or stools.  Of note patient complains of stiffness; and weakness of his proximal upper extremities and lower extremities.  He states he is having difficulty combing his hair/or lifting his upper extremities above his head.  Review of Systems  Constitutional: Positive for malaise/fatigue and weight loss. Negative for chills, diaphoresis and fever.  HENT: Negative for nosebleeds and sore throat.   Eyes: Negative for double vision.  Respiratory: Positive for shortness of breath. Negative for cough, hemoptysis, sputum production and wheezing.   Cardiovascular: Negative for chest pain, palpitations, orthopnea and leg swelling.  Gastrointestinal: Negative for abdominal pain, blood in stool, constipation, diarrhea, heartburn, melena, nausea and vomiting.  Genitourinary: Negative for dysuria,  frequency and urgency.  Musculoskeletal: Negative for back pain and joint pain.  Skin: Negative.  Negative for itching and rash.  Neurological: Negative for dizziness, tingling, focal weakness, weakness and headaches.  Endo/Heme/Allergies: Does not bruise/bleed easily.  Psychiatric/Behavioral: Negative for depression. The patient is not nervous/anxious and does not have insomnia.       PAST MEDICAL HISTORY :  Past Medical History:  Diagnosis Date  . Aorto-iliac atherosclerosis ([place]) 04/2015   by CT scan  . Carotid stenosis 11/2014   mild bilateral 1-39%, rpt 2 yrs  . Colon polyps    rpt colonoscopy due 2014  . ED (erectile dysfunction)    Trimix and VED  . Esophagitis   . Essential hypertension 06/29/2009  . GERD (gastroesophageal reflux disease)   . Glaucoma    severe ([place])  . HLD (hyperlipidemia)   . HTN (hypertension)   . Hyperglycemia   . IBS (irritable bowel syndrome)   . Prostate cancer [facility]) 2005   s/p seed implant, EBRT (Dr. Alinda Money at [facility]) recurrent treating with androgen deprivation referred to cancer center  . Smoking history     PAST SURGICAL HISTORY :   Past Surgical History:  Procedure Laterality Date  . CATARACT EXTRACTION Left 05/2017   Dr. Edilia Bo  . CATARACT EXTRACTION, BILATERAL  December 2016   [place], Dr. Raynelle Fanning  . COLONOSCOPY  03/01/01   Multiple polyps, divertic//adenomatous polyps  . COLONOSCOPY  04/24/01   multiple polyps  . COLONOSCOPY  10/23/01   Polyps benign-repeat every 2 years  . COLONOSCOPY  06/23/04   Adenom/hyperplastic polyps; multiple external hemorrhoids  . COLONOSCOPY  06/08/07   single small polyp-repeat  5 years  . ESOPHAGOGASTRODUODENOSCOPY  03/01/01   H.H.; gastritis esoph. dudodenitis  . NCS/Wrists Left Carpal  2/02   Min./right improved  . PFTs  10/2012   FVC 64%, FEV1 41%, ratio 0.62  . PROSTATE BIOPSY  12/04   positive; radioactive seed implant (Dr. Joelyn Oms)  . Prostate Ext Beam Radiation  1/27-07/23/03    Dr. Danny Lawless  . SPIROMETRY  09/2011   WNL  . Wrist Release  6/98; 9/99   Right    FAMILY HISTORY :   Family History  Problem Relation Age of Onset  . Cancer Mother        colon  . Hyperlipidemia Mother   . Other Mother        Carotid disease  . Coronary artery disease Neg Hx   . Stroke Neg Hx     SOCIAL HISTORY:   Social History   Tobacco Use  . Smoking status: Former Smoker    Last attempt to quit: 05/23/1993    Years since quitting: 24.6  . Smokeless tobacco: Never Used  Substance Use Topics  . Alcohol use: Yes    Comment: Rare  . Drug use: No    ALLERGIES:  has No Known Allergies.  MEDICATIONS:  Current Outpatient Medications  Medication Sig Dispense Refill  . aspirin EC 81 MG tablet Take 81 mg by mouth daily.    . Calcium Carbonate-Vitamin D (CALCIUM 600/VITAMIN D) 600-400 MG-UNIT chew tablet Chew 1 tablet by mouth daily.    . ferrous sulfate 325 (65 FE) MG tablet Take 1 tablet (325 mg total) by mouth daily with breakfast.  3  . leuprolide (LUPRON) 22.5 MG injection Inject 22.5 mg into the muscle every 3 (three) months.    . methylcellulose packet Take 1 each by mouth as needed for constipation. Reported on 11/05/2015    . omeprazole (PRILOSEC) 40 MG capsule Take 1 capsule (40 mg total) by mouth daily. 90 capsule 3  . rosuvastatin (CRESTOR) 10 MG tablet Take 1 tablet (10 mg total) by mouth daily. 90 tablet 3  . tamsulosin (FLOMAX) 0.4 MG CAPS capsule Take 1 capsule (0.4 mg total) by mouth daily after supper. 20 capsule 0  . venlafaxine XR (EFFEXOR-XR) 37.5 MG 24 hr capsule Take 1 capsule (37.5 mg total) by mouth daily with breakfast. 90 capsule 3   No current facility-administered medications for this visit.     PHYSICAL EXAMINATION: ECOG PERFORMANCE STATUS: 1 - Symptomatic but completely ambulatory  BP (!) 102/56   Pulse 60   Temp 97.6 F (36.4 C) (Tympanic)   Resp 20   Ht 5' 9"  (1.753 m)   Wt 138 lb 12.8 oz (63 kg)   BMI 20.50 kg/m   Filed Weights    01/03/18 0957  Weight: 138 lb 12.8 oz (63 kg)    Physical Exam  Constitutional: He is oriented to person, place, and time.  Thin built cachectic appearing male patient accompanied by his wife.  Walking by himself.  Appears pale.  HENT:  Head: Normocephalic and atraumatic.  Mouth/Throat: Oropharynx is clear and moist. No oropharyngeal exudate.  Eyes: Pupils are equal, round, and reactive to light.  Neck: Normal range of motion. Neck supple.  Cardiovascular: Normal rate and regular rhythm.  Pulmonary/Chest: No respiratory distress. He has no wheezes.  Abdominal: Soft. Bowel sounds are normal. He exhibits no distension and no mass. There is no tenderness. There is no rebound and no guarding.  Musculoskeletal: Normal range of motion. He exhibits no edema  or tenderness.  Neurological: He is alert and oriented to person, place, and time.  Skin: Skin is warm. There is pallor.  Psychiatric: Affect normal.       LABORATORY DATA:  I have reviewed the data as listed    Component Value Date/Time   NA 138 11/29/2017 1506   K 4.5 11/29/2017 1506   CL 109 11/29/2017 1506   CO2 21 (L) 11/29/2017 1506   GLUCOSE 127 (H) 11/29/2017 1506   BUN 58 (H) 11/29/2017 1506   CREATININE 1.66 (H) 11/29/2017 1506   CALCIUM 9.5 11/29/2017 1506   PROT 7.9 11/29/2017 1506   ALBUMIN 3.4 (L) 11/29/2017 1506   AST 22 11/29/2017 1506   ALT 13 11/29/2017 1506   ALKPHOS 53 11/29/2017 1506   BILITOT 0.3 11/29/2017 1506   GFRNONAA 37 (L) 11/29/2017 1506   GFRAA 42 (L) 11/29/2017 1506    No results found for: SPEP, UPEP  Lab Results  Component Value Date   WBC 5.5 01/03/2018   NEUTROABS 4.4 01/03/2018   HGB 8.1 (L) 01/03/2018   HCT 25.5 (L) 01/03/2018   MCV 86.6 01/03/2018   PLT 209 01/03/2018      Chemistry      Component Value Date/Time   NA 138 11/29/2017 1506   K 4.5 11/29/2017 1506   CL 109 11/29/2017 1506   CO2 21 (L) 11/29/2017 1506   BUN 58 (H) 11/29/2017 1506   CREATININE 1.66 (H)  11/29/2017 1506      Component Value Date/Time   CALCIUM 9.5 11/29/2017 1506   ALKPHOS 53 11/29/2017 1506   AST 22 11/29/2017 1506   ALT 13 11/29/2017 1506   BILITOT 0.3 11/29/2017 1506       RADIOGRAPHIC STUDIES: I have personally reviewed the radiological images as listed and agreed with the findings in the report. No results found.   ASSESSMENT & PLAN:  Normocytic anemia #Normocytic anemia-unclear etiology status post bone marrow biopsy.  Hemoglobin 8.1.  Hold blood transfusion.  #Proximal upper and lower extremity weakness-question polymyalgia rheumatica; possible cause of patient's anemia.  Refer to rheumatology.   #Bilateral hydro-nephrosis /hydroureter - bladder neck obstruction; stable.  Status post Foley catheter removal.  Currently straight cathing.  Followed by urology.  #Dizzy spells-blood pressure normal/borderline low; recommend holding of lisinopril/also with renal insufficiency.  # Metastatic prostate cancer Hormone sensitive- biochemical recurrence [M0]-  [on Lupron 09/20/2017] -STABLE.   # NO trasnfusion today; 2 weeks/HOLD tube possible PRBC transfusion  # follow up in 6 weeks/cbc/bmp/Hold tube; possible blood transfusion-Dr.B   Orders Placed This Encounter  Procedures  . CBC with Differential    Standing Status:   Future    Standing Expiration Date:   01/03/2019  . CBC with Differential    Standing Status:   Future    Standing Expiration Date:   01/03/2019  . Basic metabolic panel    Standing Status:   Future    Standing Expiration Date:   01/03/2019  . Hold Tube- Blood Bank    Standing Status:   Future    Standing Expiration Date:   01/04/2019  . Hold Tube- Blood Bank    Standing Status:   Future    Standing Expiration Date:   01/04/2019   All questions were answered. The patient knows to call the clinic with any problems, questions or concerns.      Cammie Sickle, MD 01/09/2018 9:16 PM

## 2018-01-03 NOTE — Assessment & Plan Note (Addendum)
#  Normocytic anemia-unclear etiology status post bone marrow biopsy.  Hemoglobin 8.1.  Hold blood transfusion.  #Proximal upper and lower extremity weakness-question polymyalgia rheumatica; possible cause of patient's anemia.  Refer to rheumatology.   #Bilateral hydro-nephrosis /hydroureter - bladder neck obstruction; stable.  Status post Foley catheter removal.  Currently straight cathing.  Followed by urology.  # Metastatic prostate cancer Hormone sensitive- biochemical recurrence [M0]-  [on Lupron 09/20/2017] -STABLE.   # NO trasnfusion today; 2 weeks/HOLD tube possible PRBC transfusion/add Lupron.   # follow up in 6 weeks/cbc/bmp/Hold tube; possible blood transfusion-Dr.B

## 2018-01-09 ENCOUNTER — Telehealth: Payer: Self-pay | Admitting: Internal Medicine

## 2018-01-09 DIAGNOSIS — Z8546 Personal history of malignant neoplasm of prostate: Secondary | ICD-10-CM

## 2018-01-09 DIAGNOSIS — C61 Malignant neoplasm of prostate: Secondary | ICD-10-CM

## 2018-01-09 NOTE — Telephone Encounter (Signed)
Please schedule pt for Lupron on 8/28; when he comes for possible PRBC transfusion.  Also, add PSA at his next visit in [place], 25th 2019.   Thx

## 2018-01-10 ENCOUNTER — Encounter: Payer: Self-pay | Admitting: Urology

## 2018-01-10 ENCOUNTER — Ambulatory Visit (INDEPENDENT_AMBULATORY_CARE_PROVIDER_SITE_OTHER): Payer: Medicare PPO | Admitting: Urology

## 2018-01-10 VITALS — BP 109/48 | HR 62 | Ht 69.0 in | Wt 142.9 lb

## 2018-01-10 DIAGNOSIS — C61 Malignant neoplasm of prostate: Secondary | ICD-10-CM | POA: Diagnosis not present

## 2018-01-10 DIAGNOSIS — N1330 Unspecified hydronephrosis: Secondary | ICD-10-CM | POA: Diagnosis not present

## 2018-01-10 DIAGNOSIS — R339 Retention of urine, unspecified: Secondary | ICD-10-CM | POA: Diagnosis not present

## 2018-01-10 LAB — URINALYSIS, COMPLETE
BILIRUBIN UA: NEGATIVE
Glucose, UA: NEGATIVE
KETONES UA: NEGATIVE
NITRITE UA: POSITIVE — AB
PH UA: 7 (ref 5.0–7.5)
SPEC GRAV UA: 1.015 (ref 1.005–1.030)
UUROB: 0.2 mg/dL (ref 0.2–1.0)

## 2018-01-10 LAB — MICROSCOPIC EXAMINATION
EPITHELIAL CELLS (NON RENAL): NONE SEEN /HPF (ref 0–10)
WBC, UA: >30 /hpf — ABNORMAL HIGH (ref 0–5)

## 2018-01-10 MED ORDER — LEVOFLOXACIN 500 MG PO TABS
500.0000 mg | ORAL_TABLET | Freq: Once | ORAL | Status: AC
  Administered 2018-01-10: 500 mg via ORAL

## 2018-01-10 MED ORDER — LIDOCAINE HCL URETHRAL/MUCOSAL 2 % EX GEL
1.0000 "application " | Freq: Once | CUTANEOUS | Status: AC
  Administered 2018-01-10: 1 via URETHRAL

## 2018-01-10 MED ORDER — CIPROFLOXACIN HCL 500 MG PO TABS: 500.0000 mg | ORAL_TABLET | Freq: Once | ORAL | Status: DC

## 2018-01-10 NOTE — Addendum Note (Signed)
Addended by: Sabino Gasser on: 01/10/2018 03:38 PM   Modules accepted: Orders

## 2018-01-10 NOTE — Progress Notes (Signed)
   01/10/18  CC: Urinary retention  HPI: 84 year old male with incomplete bladder emptying.  He had bilateral hydronephrosis which persisted after a.  A Foley catheter drainage.  Recent Lasix renogram showed no evidence of obstruction.  There were no vitals taken for this visit. NED. A&Ox3.     Cystoscopy Procedure Note  Patient identification was confirmed, informed consent was obtained, and patient was prepped using Betadine solution.  Lidocaine jelly was administered per urethral meatus.    Preoperative abx where received prior to procedure.     Pre-Procedure: - Inspection reveals a normal caliber ureteral meatus.  Procedure: The flexible cystoscope was introduced without difficulty - No urethral strictures/lesions are present. - Mild lateral lobe enlargement prostate.  No visual occlusion - Normal bladder neck - Bilateral ureteral orifices identified - Bladder mucosa  reveals no ulcers, tumors, or lesions - No bladder stones -Moderate trabeculation  Retroflexion shows no intravesical median lobe  Post-Procedure: - Patient tolerated the procedure well  Assessment/ Plan: No significant prostate enlargement on cystoscopy.  His incomplete bladder emptying may be secondary to bladder hypotonicity.  He would like to discontinue intermittent catheterization if at all possible.  Am not optimistic that a procedure would help him empty significantly based on his cystoscopy today however he was interested in pursuing.  Will schedule a urodynamic study.

## 2018-01-12 ENCOUNTER — Encounter: Payer: Medicare PPO | Admitting: Nurse Practitioner

## 2018-01-12 ENCOUNTER — Telehealth: Payer: Self-pay | Admitting: *Deleted

## 2018-01-12 ENCOUNTER — Ambulatory Visit: Payer: Medicare PPO

## 2018-01-12 DIAGNOSIS — D649 Anemia, unspecified: Secondary | ICD-10-CM

## 2018-01-12 DIAGNOSIS — R109 Unspecified abdominal pain: Secondary | ICD-10-CM

## 2018-01-12 DIAGNOSIS — N1330 Unspecified hydronephrosis: Secondary | ICD-10-CM

## 2018-01-12 NOTE — Telephone Encounter (Signed)
Wife called twice regarding same thing, patient has pain in his side radiating into his stomach. She cannot get him to go to ER, He states he will be alright. She is unsure that he will come here either. Appointment offer ed for this afternoon and she will let me know if he will come to appt

## 2018-01-13 ENCOUNTER — Other Ambulatory Visit: Payer: Self-pay | Admitting: Family Medicine

## 2018-01-13 DIAGNOSIS — R778 Other specified abnormalities of plasma proteins: Secondary | ICD-10-CM

## 2018-01-13 DIAGNOSIS — D472 Monoclonal gammopathy: Secondary | ICD-10-CM | POA: Insufficient documentation

## 2018-01-16 DIAGNOSIS — R5382 Chronic fatigue, unspecified: Secondary | ICD-10-CM | POA: Diagnosis not present

## 2018-01-16 DIAGNOSIS — R700 Elevated erythrocyte sedimentation rate: Secondary | ICD-10-CM | POA: Insufficient documentation

## 2018-01-16 DIAGNOSIS — N183 Chronic kidney disease, stage 3 (moderate): Secondary | ICD-10-CM | POA: Diagnosis not present

## 2018-01-16 DIAGNOSIS — R634 Abnormal weight loss: Secondary | ICD-10-CM | POA: Diagnosis not present

## 2018-01-16 DIAGNOSIS — C61 Malignant neoplasm of prostate: Secondary | ICD-10-CM | POA: Diagnosis not present

## 2018-01-16 DIAGNOSIS — R5381 Other malaise: Secondary | ICD-10-CM | POA: Insufficient documentation

## 2018-01-16 DIAGNOSIS — R778 Other specified abnormalities of plasma proteins: Secondary | ICD-10-CM | POA: Diagnosis not present

## 2018-01-16 DIAGNOSIS — R499 Unspecified voice and resonance disorder: Secondary | ICD-10-CM | POA: Diagnosis not present

## 2018-01-17 ENCOUNTER — Telehealth: Payer: Self-pay

## 2018-01-17 ENCOUNTER — Telehealth: Payer: Self-pay | Admitting: Internal Medicine

## 2018-01-17 ENCOUNTER — Inpatient Hospital Stay: Payer: Medicare PPO

## 2018-01-17 DIAGNOSIS — C61 Malignant neoplasm of prostate: Secondary | ICD-10-CM | POA: Diagnosis not present

## 2018-01-17 DIAGNOSIS — R5381 Other malaise: Secondary | ICD-10-CM | POA: Diagnosis not present

## 2018-01-17 DIAGNOSIS — R5383 Other fatigue: Secondary | ICD-10-CM | POA: Diagnosis not present

## 2018-01-17 DIAGNOSIS — R634 Abnormal weight loss: Secondary | ICD-10-CM | POA: Diagnosis not present

## 2018-01-17 DIAGNOSIS — D649 Anemia, unspecified: Secondary | ICD-10-CM | POA: Diagnosis not present

## 2018-01-17 DIAGNOSIS — R531 Weakness: Secondary | ICD-10-CM | POA: Diagnosis not present

## 2018-01-17 DIAGNOSIS — R0609 Other forms of dyspnea: Secondary | ICD-10-CM | POA: Diagnosis not present

## 2018-01-17 DIAGNOSIS — Z923 Personal history of irradiation: Secondary | ICD-10-CM | POA: Diagnosis not present

## 2018-01-17 DIAGNOSIS — Z79818 Long term (current) use of other agents affecting estrogen receptors and estrogen levels: Secondary | ICD-10-CM | POA: Diagnosis not present

## 2018-01-17 LAB — CBC WITH DIFFERENTIAL/PLATELET
BASOS PCT: 1 %
Basophils Absolute: 0 10*3/uL (ref 0–0.1)
EOS ABS: 0.1 10*3/uL (ref 0–0.7)
Eosinophils Relative: 3 %
HCT: 25.9 % — ABNORMAL LOW (ref 40.0–52.0)
HEMOGLOBIN: 8.2 g/dL — AB (ref 13.0–18.0)
Lymphocytes Relative: 17 %
Lymphs Abs: 0.9 10*3/uL — ABNORMAL LOW (ref 1.0–3.6)
MCH: 27.8 pg (ref 26.0–34.0)
MCHC: 31.9 g/dL — ABNORMAL LOW (ref 32.0–36.0)
MCV: 87.3 fL (ref 80.0–100.0)
Monocytes Absolute: 0.3 10*3/uL (ref 0.2–1.0)
Monocytes Relative: 5 %
NEUTROS PCT: 74 %
Neutro Abs: 4.1 10*3/uL (ref 1.4–6.5)
Platelets: 281 10*3/uL (ref 150–440)
RBC: 2.97 MIL/uL — AB (ref 4.40–5.90)
RDW: 18.6 % — ABNORMAL HIGH (ref 11.5–14.5)
WBC: 5.5 10*3/uL (ref 3.8–10.6)

## 2018-01-17 LAB — SAMPLE TO BLOOD BANK

## 2018-01-17 LAB — PSA: PROSTATIC SPECIFIC ANTIGEN: 2.36 ng/mL (ref 0.00–4.00)

## 2018-01-17 MED ORDER — LEUPROLIDE ACETATE (3 MONTH) 22.5 MG IM KIT
22.5000 mg | PACK | Freq: Once | INTRAMUSCULAR | Status: AC
  Administered 2018-01-17: 22.5 mg via INTRAMUSCULAR
  Filled 2018-01-17: qty 22.5

## 2018-01-17 NOTE — Telephone Encounter (Signed)
Spoke to pt re: urodynamic testing. He agrees.

## 2018-01-17 NOTE — Telephone Encounter (Signed)
Patient was seen by urologist last week and they discussed him having a procedure (he's not sure of the procedure but the urology note mentions scheduling a urodynamic study).  Patient has discussed this with his family and is now thinking of declining the procedure at this time but he wanted Dr. Aletha Halim professional opinion.  He will wait until he hears from our office before calling urologist office to cancel procedure.

## 2018-01-18 NOTE — Telephone Encounter (Signed)
Dr. Brahmanday spoke with patient. 

## 2018-01-29 DIAGNOSIS — R490 Dysphonia: Secondary | ICD-10-CM | POA: Diagnosis not present

## 2018-01-29 DIAGNOSIS — K219 Gastro-esophageal reflux disease without esophagitis: Secondary | ICD-10-CM | POA: Diagnosis not present

## 2018-02-06 ENCOUNTER — Encounter: Payer: Self-pay | Admitting: Urology

## 2018-02-06 ENCOUNTER — Ambulatory Visit (INDEPENDENT_AMBULATORY_CARE_PROVIDER_SITE_OTHER): Payer: Medicare PPO | Admitting: Urology

## 2018-02-06 VITALS — BP 110/50 | HR 55 | Ht 69.0 in | Wt 144.2 lb

## 2018-02-06 DIAGNOSIS — R339 Retention of urine, unspecified: Secondary | ICD-10-CM

## 2018-02-06 NOTE — Progress Notes (Signed)
Patient had a follow-up appointment scheduled today for his urodynamic study results however he never had the study done.

## 2018-02-14 ENCOUNTER — Encounter: Payer: Self-pay | Admitting: Internal Medicine

## 2018-02-14 ENCOUNTER — Inpatient Hospital Stay: Payer: Medicare PPO | Attending: Internal Medicine

## 2018-02-14 ENCOUNTER — Other Ambulatory Visit: Payer: Self-pay

## 2018-02-14 ENCOUNTER — Inpatient Hospital Stay (HOSPITAL_BASED_OUTPATIENT_CLINIC_OR_DEPARTMENT_OTHER): Payer: Medicare PPO | Admitting: Internal Medicine

## 2018-02-14 ENCOUNTER — Inpatient Hospital Stay: Payer: Medicare PPO

## 2018-02-14 ENCOUNTER — Telehealth: Payer: Self-pay | Admitting: Internal Medicine

## 2018-02-14 VITALS — BP 162/64 | HR 56 | Temp 97.0°F | Resp 20

## 2018-02-14 DIAGNOSIS — Z923 Personal history of irradiation: Secondary | ICD-10-CM | POA: Diagnosis not present

## 2018-02-14 DIAGNOSIS — R32 Unspecified urinary incontinence: Secondary | ICD-10-CM | POA: Diagnosis not present

## 2018-02-14 DIAGNOSIS — K219 Gastro-esophageal reflux disease without esophagitis: Secondary | ICD-10-CM | POA: Insufficient documentation

## 2018-02-14 DIAGNOSIS — Z79818 Long term (current) use of other agents affecting estrogen receptors and estrogen levels: Secondary | ICD-10-CM | POA: Insufficient documentation

## 2018-02-14 DIAGNOSIS — D649 Anemia, unspecified: Secondary | ICD-10-CM | POA: Diagnosis not present

## 2018-02-14 DIAGNOSIS — R0600 Dyspnea, unspecified: Secondary | ICD-10-CM | POA: Diagnosis not present

## 2018-02-14 DIAGNOSIS — Z87891 Personal history of nicotine dependence: Secondary | ICD-10-CM

## 2018-02-14 DIAGNOSIS — Z79899 Other long term (current) drug therapy: Secondary | ICD-10-CM

## 2018-02-14 DIAGNOSIS — Z800 Family history of malignant neoplasm of digestive organs: Secondary | ICD-10-CM | POA: Insufficient documentation

## 2018-02-14 DIAGNOSIS — C61 Malignant neoplasm of prostate: Secondary | ICD-10-CM | POA: Diagnosis not present

## 2018-02-14 DIAGNOSIS — Z7982 Long term (current) use of aspirin: Secondary | ICD-10-CM | POA: Diagnosis not present

## 2018-02-14 DIAGNOSIS — I129 Hypertensive chronic kidney disease with stage 1 through stage 4 chronic kidney disease, or unspecified chronic kidney disease: Secondary | ICD-10-CM | POA: Diagnosis not present

## 2018-02-14 DIAGNOSIS — E785 Hyperlipidemia, unspecified: Secondary | ICD-10-CM | POA: Insufficient documentation

## 2018-02-14 DIAGNOSIS — R64 Cachexia: Secondary | ICD-10-CM

## 2018-02-14 DIAGNOSIS — N189 Chronic kidney disease, unspecified: Secondary | ICD-10-CM | POA: Diagnosis not present

## 2018-02-14 DIAGNOSIS — R5383 Other fatigue: Secondary | ICD-10-CM

## 2018-02-14 DIAGNOSIS — R5381 Other malaise: Secondary | ICD-10-CM

## 2018-02-14 LAB — BASIC METABOLIC PANEL
ANION GAP: 8 (ref 5–15)
BUN: 46 mg/dL — ABNORMAL HIGH (ref 8–23)
CO2: 24 mmol/L (ref 22–32)
Calcium: 9.8 mg/dL (ref 8.9–10.3)
Chloride: 107 mmol/L (ref 98–111)
Creatinine, Ser: 1.5 mg/dL — ABNORMAL HIGH (ref 0.61–1.24)
GFR calc Af Amer: 48 mL/min — ABNORMAL LOW (ref >60)
GFR, EST NON AFRICAN AMERICAN: 41 mL/min — AB (ref >60)
Glucose, Bld: 124 mg/dL — ABNORMAL HIGH (ref 70–99)
Potassium: 5.3 mmol/L — ABNORMAL HIGH (ref 3.5–5.1)
SODIUM: 139 mmol/L (ref 135–145)

## 2018-02-14 LAB — IRON AND TIBC
Iron: 42 ug/dL — ABNORMAL LOW (ref 45–182)
Saturation Ratios: 12 % — ABNORMAL LOW (ref 17.9–39.5)
TIBC: 343 ug/dL (ref 250–450)
UIBC: 301 ug/dL

## 2018-02-14 LAB — CBC WITH DIFFERENTIAL/PLATELET
BASOS PCT: 1 %
Basophils Absolute: 0 10*3/uL (ref 0–0.1)
EOS PCT: 2 %
Eosinophils Absolute: 0.1 10*3/uL (ref 0–0.7)
HCT: 29.7 % — ABNORMAL LOW (ref 40.0–52.0)
Hemoglobin: 9.5 g/dL — ABNORMAL LOW (ref 13.0–18.0)
Lymphocytes Relative: 15 %
Lymphs Abs: 0.9 10*3/uL — ABNORMAL LOW (ref 1.0–3.6)
MCH: 28.3 pg (ref 26.0–34.0)
MCHC: 32.1 g/dL (ref 32.0–36.0)
MCV: 88.1 fL (ref 80.0–100.0)
MONO ABS: 0.3 10*3/uL (ref 0.2–1.0)
Monocytes Relative: 6 %
NEUTROS ABS: 4.6 10*3/uL (ref 1.4–6.5)
Neutrophils Relative %: 76 %
PLATELETS: 211 10*3/uL (ref 150–440)
RBC: 3.37 MIL/uL — AB (ref 4.40–5.90)
RDW: 17.5 % — AB (ref 11.5–14.5)
WBC: 6 10*3/uL (ref 3.8–10.6)

## 2018-02-14 LAB — SAMPLE TO BLOOD BANK

## 2018-02-14 LAB — FERRITIN: Ferritin: 64 ng/mL (ref 24–336)

## 2018-02-14 MED ORDER — PREDNISONE 20 MG PO TABS: 20.0000 mg | ORAL_TABLET | Freq: Every day | ORAL | 0 refills | Status: DC

## 2018-02-14 NOTE — Telephone Encounter (Signed)
Lab orders entered

## 2018-02-14 NOTE — Assessment & Plan Note (Addendum)
#  Normocytic anemia-unclear etiology status post bone marrow biopsy.  Today hemoglobin 9.2. Hold blood transfusion.  #Etiology of anemia unclear-question rheumatoid arthritis given elevated Anti-CCP; but no obvious clinical signs of RA; discussed with Dr.Bock. Plan to start prednisone 20 mg/day.   #Bilateral hydro-nephrosis /hydroureter - bladder neck obstruction; followed by urology.  Our office has contacted urology office regarding follow-up/further testing.  #CKD stage III creatinine 1.5.  Stable.  # Metastatic prostate cancer Hormone sensitive- biochemical recurrence [M0]-  [on Lupron 09/20/2017] -STABLE.   # follow up in 1 month/ cbc/bmp/hold tube/MD.

## 2018-02-14 NOTE — Progress Notes (Signed)
[place] OFFICE PROGRESS NOTE  Patient Care Team: [name], MD as PCP - General (Family Medicine) Bond, Tracie Harrier, MD as Consulting Physician (Ophthalmology) Abbie Sons, MD as Consulting Physician (Urology)  Cancer Staging No matching staging information was found for the patient.   Oncology History   # external beam radiation therapy and radiation seed implant in 2005 with Dr. Joelyn Oms and Dr. Danny Lawless for T1c Gleason 3+4 = 7 prostate cancer with a pretreatment PSA of 6.8. In November 2015, he was found to have biochemical recurrence.  Dec 2016- CT a/p & Bone scan- NEG; PSA- 11; June 2017- 19; STARTED on Firmagon [Dr.Budzyn]; on Lupron q 48M  # OCT 2018- BMBx- hypercellular 30%; relative myeloid hyperplasia/erythroid hypoplasia; FISH- 5/7/8-Normal. II OPINION at [facility]   # JAN 2018- Dexa scan- N     Primary adenocarcinoma of prostate ([place])      INTERVAL HISTORY:  Thomas Lopez 84 y.o.  male pleasant patient above history of anemia of unclear etiology is here for follow-up.  Patient interim was evaluated by rheumatology for any autoimmune cause of his anemia/fatigue.  He has been followed by urology for his hydronephrosis.  Currently patient is not on a catheter.  Is also not straight cathing himself.  He is concerned about urinary incontinence.  Awaiting further testing with urology.  Patient complains of continued fatigue.  Continued shortness of breath and exertion.  Poor appetite.  Review of Systems  Constitutional: Positive for malaise/fatigue and weight loss. Negative for chills, diaphoresis and fever.  HENT: Negative for nosebleeds and sore throat.   Eyes: Negative for double vision.  Respiratory: Positive for shortness of breath. Negative for cough, hemoptysis, sputum production and wheezing.   Cardiovascular: Negative for chest pain, palpitations, orthopnea and leg swelling.  Gastrointestinal: Negative for abdominal pain, blood in  stool, constipation, diarrhea, heartburn, melena, nausea and vomiting.  Genitourinary: Negative for dysuria, frequency and urgency.  Musculoskeletal: Negative for back pain and joint pain.  Skin: Negative.  Negative for itching and rash.  Neurological: Negative for dizziness, tingling, focal weakness, weakness and headaches.  Endo/Heme/Allergies: Does not bruise/bleed easily.  Psychiatric/Behavioral: Negative for depression. The patient is not nervous/anxious and does not have insomnia.       PAST MEDICAL HISTORY :  Past Medical History:  Diagnosis Date  . Aorto-iliac atherosclerosis ([place]) 04/2015   by CT scan  . Carotid stenosis 11/2014   mild bilateral 1-39%, rpt 2 yrs  . Colon polyps    rpt colonoscopy due 2014  . ED (erectile dysfunction)    Trimix and VED  . Esophagitis   . Essential hypertension 06/29/2009  . GERD (gastroesophageal reflux disease)   . Glaucoma    severe ([place])  . HLD (hyperlipidemia)   . HTN (hypertension)   . Hyperglycemia   . IBS (irritable bowel syndrome)   . Prostate cancer [facility]) 2005   s/p seed implant, EBRT ([facility]. Alinda Money at [facility]) recurrent treating with androgen deprivation referred to cancer center  . Smoking history     PAST SURGICAL HISTORY :   Past Surgical History:  Procedure Laterality Date  . CATARACT EXTRACTION Left 05/2017   Dr. Edilia Bo  . CATARACT EXTRACTION, BILATERAL  December 2016   [place], Dr. Raynelle Fanning  . COLONOSCOPY  03/01/01   Multiple polyps, divertic//adenomatous polyps  . COLONOSCOPY  04/24/01   multiple polyps  . COLONOSCOPY  10/23/01   Polyps benign-repeat every 2 years  . COLONOSCOPY  06/23/04  Adenom/hyperplastic polyps; multiple external hemorrhoids  . COLONOSCOPY  06/08/07   single small polyp-repeat 5 years  . ESOPHAGOGASTRODUODENOSCOPY  03/01/01   H.H.; gastritis esoph. dudodenitis  . NCS/Wrists Left Carpal  2/02   Min./right improved  . PFTs  10/2012   FVC 64%, FEV1 41%, ratio 0.62  . PROSTATE  BIOPSY  12/04   positive; radioactive seed implant (Dr. Joelyn Oms)  . Prostate Ext Beam Radiation  1/27-07/23/03   Dr. Danny Lawless  . SPIROMETRY  09/2011   WNL  . Wrist Release  6/98; 9/99   Right    FAMILY HISTORY :   Family History  Problem Relation Age of Onset  . Cancer Mother        colon  . Hyperlipidemia Mother   . Other Mother        Carotid disease  . Coronary artery disease Neg Hx   . Stroke Neg Hx     SOCIAL HISTORY:   Social History   Tobacco Use  . Smoking status: Former Smoker    Last attempt to quit: 05/23/1993    Years since quitting: 24.7  . Smokeless tobacco: Never Used  Substance Use Topics  . Alcohol use: Yes    Comment: Rare  . Drug use: No    ALLERGIES:  has No Known Allergies.  MEDICATIONS:  Current Outpatient Medications  Medication Sig Dispense Refill  . aspirin EC 81 MG tablet Take 81 mg by mouth daily.    . Calcium Carbonate-Vitamin D (CALCIUM 600/VITAMIN D) 600-400 MG-UNIT chew tablet Chew 1 tablet by mouth daily.    . ferrous sulfate 325 (65 FE) MG tablet Take 1 tablet (325 mg total) by mouth daily with breakfast.  3  . leuprolide (LUPRON) 22.5 MG injection Inject 22.5 mg into the muscle every 3 (three) months.    . methylcellulose packet Take 1 each by mouth as needed for constipation. Reported on 11/05/2015    . omeprazole (PRILOSEC) 40 MG capsule Take 1 capsule (40 mg total) by mouth daily. 90 capsule 3  . rosuvastatin (CRESTOR) 10 MG tablet Take 1 tablet (10 mg total) by mouth daily. 90 tablet 3  . tamsulosin (FLOMAX) 0.4 MG CAPS capsule Take 1 capsule (0.4 mg total) by mouth daily after supper. 20 capsule 0  . timolol (TIMOPTIC) 0.5 % ophthalmic solution Apply to eye.    . venlafaxine XR (EFFEXOR-XR) 37.5 MG 24 hr capsule Take 1 capsule (37.5 mg total) by mouth daily with breakfast. 90 capsule 3  . predniSONE (DELTASONE) 20 MG tablet Take 1 tablet (20 mg total) by mouth daily with breakfast. 30 tablet 0   No current facility-administered  medications for this visit.     PHYSICAL EXAMINATION: ECOG PERFORMANCE STATUS: 1 - Symptomatic but completely ambulatory  BP (!) 162/64   Pulse (!) 56   Temp (!) 97 F (36.1 C) (Tympanic)   Resp 20   There were no vitals filed for this visit.  Physical Exam  Constitutional: He is oriented to person, place, and time.  Thin built cachectic appearing male patient accompanied by his wife.  Walking by himself.  Appears pale.  HENT:  Head: Normocephalic and atraumatic.  Mouth/Throat: Oropharynx is clear and moist. No oropharyngeal exudate.  Eyes: Pupils are equal, round, and reactive to light.  Neck: Normal range of motion. Neck supple.  Cardiovascular: Normal rate and regular rhythm.  Pulmonary/Chest: No respiratory distress. He has no wheezes.  Abdominal: Soft. Bowel sounds are normal. He exhibits no distension and no  mass. There is no tenderness. There is no rebound and no guarding.  Musculoskeletal: Normal range of motion. He exhibits no edema or tenderness.  Neurological: He is alert and oriented to person, place, and time.  Skin: Skin is warm. There is pallor.  Psychiatric: Affect normal.       LABORATORY DATA:  I have reviewed the data as listed    Component Value Date/Time   NA 139 02/14/2018 0919   K 5.3 (H) 02/14/2018 0919   CL 107 02/14/2018 0919   CO2 24 02/14/2018 0919   GLUCOSE 124 (H) 02/14/2018 0919   BUN 46 (H) 02/14/2018 0919   CREATININE 1.50 (H) 02/14/2018 0919   CALCIUM 9.8 02/14/2018 0919   PROT 7.9 11/29/2017 1506   ALBUMIN 3.4 (L) 11/29/2017 1506   AST 22 11/29/2017 1506   ALT 13 11/29/2017 1506   ALKPHOS 53 11/29/2017 1506   BILITOT 0.3 11/29/2017 1506   GFRNONAA 41 (L) 02/14/2018 0919   GFRAA 48 (L) 02/14/2018 0919    No results found for: SPEP, UPEP  Lab Results  Component Value Date   WBC 6.0 02/14/2018   NEUTROABS 4.6 02/14/2018   HGB 9.5 (L) 02/14/2018   HCT 29.7 (L) 02/14/2018   MCV 88.1 02/14/2018   PLT 211 02/14/2018       Chemistry      Component Value Date/Time   NA 139 02/14/2018 0919   K 5.3 (H) 02/14/2018 0919   CL 107 02/14/2018 0919   CO2 24 02/14/2018 0919   BUN 46 (H) 02/14/2018 0919   CREATININE 1.50 (H) 02/14/2018 0919      Component Value Date/Time   CALCIUM 9.8 02/14/2018 0919   ALKPHOS 53 11/29/2017 1506   AST 22 11/29/2017 1506   ALT 13 11/29/2017 1506   BILITOT 0.3 11/29/2017 1506       RADIOGRAPHIC STUDIES: I have personally reviewed the radiological images as listed and agreed with the findings in the report. No results found.   ASSESSMENT & PLAN:  Normocytic anemia #Normocytic anemia-unclear etiology status post bone marrow biopsy.  Today hemoglobin 9.2. Hold blood transfusion.  #Etiology of anemia unclear-question rheumatoid arthritis given elevated Anti-CCP; but no obvious clinical signs of RA; discussed with Dr.Bock. Plan to start prednisone 20 mg/day.   #Bilateral hydro-nephrosis /hydroureter - bladder neck obstruction; followed by urology.  Our office has contacted urology office regarding follow-up/further testing.  #CKD stage III creatinine 1.5.  Stable.  # Metastatic prostate cancer Hormone sensitive- biochemical recurrence [M0]-  [on Lupron 09/20/2017] -STABLE.   # follow up in 1 month/ cbc/bmp/hold tube/MD.    Orders Placed This Encounter  Procedures  . CBC with Differential/Platelet    Standing Status:   Future    Standing Expiration Date:   02/15/2019  . Comprehensive metabolic panel    Standing Status:   Future    Standing Expiration Date:   02/15/2019  . Iron and TIBC    Standing Status:   Future    Standing Expiration Date:   02/15/2019  . Ferritin    Standing Status:   Future    Standing Expiration Date:   02/15/2019  . Sample to Blood Bank    Standing Status:   Future    Standing Expiration Date:   02/15/2019   All questions were answered. The patient knows to call the clinic with any problems, questions or concerns.      Cammie Sickle,  MD 02/14/2018 12:24 PM

## 2018-02-14 NOTE — Telephone Encounter (Signed)
Brooke-please add iron studies ferritin to his labs from today.  Thank you

## 2018-02-14 NOTE — Patient Instructions (Signed)
#  Take prednisone 1 pill a day in the morning with breakfast.

## 2018-02-14 NOTE — Progress Notes (Signed)
Patient states that he was to be scheduled for a study in [place] for a "procedure." "no one has contacted me with an apt." pt unable to remember the exact procedure being planned. Reviewed chart. Dr. Michaelene Song notes. MD had planned a urodynamic study. Pt also states that he is no longer self-cathing as "urine flows freely." pt admits to urge incontinence. "I saw no need to cath, when urine is flowing from my penis."  I offered to contact the urology department to determine the next steps to scheduling the urodynamic study. I contacted [facility] urology left vm for Sharyn Lull to return my phone call. Pt made aware that I left a msg with urology before leaving the clinic. Sharyn Lull contacted me back at 1150 am. Pt will f/u with urology per Sharyn Lull on 10/7. She will send a msg to [facility] in [place] to see if this apt could be expedited before next urology apt. I asked Sharyn Lull to have this dept to also reach out and speak with patient's wife.  Patient is a poor historian and often does not share his apts with her.

## 2018-02-14 NOTE — Addendum Note (Signed)
Addended by: Sabino Gasser on: 02/14/2018 12:11 PM   Modules accepted: Orders

## 2018-02-19 DIAGNOSIS — H401133 Primary open-angle glaucoma, bilateral, severe stage: Secondary | ICD-10-CM | POA: Diagnosis not present

## 2018-02-26 DIAGNOSIS — R351 Nocturia: Secondary | ICD-10-CM | POA: Diagnosis not present

## 2018-02-26 DIAGNOSIS — R3914 Feeling of incomplete bladder emptying: Secondary | ICD-10-CM | POA: Diagnosis not present

## 2018-02-26 DIAGNOSIS — R3915 Urgency of urination: Secondary | ICD-10-CM | POA: Diagnosis not present

## 2018-02-26 DIAGNOSIS — R350 Frequency of micturition: Secondary | ICD-10-CM | POA: Diagnosis not present

## 2018-02-28 DIAGNOSIS — R490 Dysphonia: Secondary | ICD-10-CM | POA: Diagnosis not present

## 2018-02-28 DIAGNOSIS — K219 Gastro-esophageal reflux disease without esophagitis: Secondary | ICD-10-CM | POA: Diagnosis not present

## 2018-03-07 ENCOUNTER — Encounter: Payer: Self-pay | Admitting: Urology

## 2018-03-07 ENCOUNTER — Ambulatory Visit: Payer: Medicare PPO | Admitting: Urology

## 2018-03-07 DIAGNOSIS — R3915 Urgency of urination: Secondary | ICD-10-CM | POA: Diagnosis not present

## 2018-03-07 DIAGNOSIS — R3914 Feeling of incomplete bladder emptying: Secondary | ICD-10-CM | POA: Diagnosis not present

## 2018-03-07 DIAGNOSIS — R351 Nocturia: Secondary | ICD-10-CM | POA: Diagnosis not present

## 2018-03-07 DIAGNOSIS — R350 Frequency of micturition: Secondary | ICD-10-CM | POA: Diagnosis not present

## 2018-03-12 ENCOUNTER — Other Ambulatory Visit: Payer: Self-pay | Admitting: Urology

## 2018-03-14 ENCOUNTER — Other Ambulatory Visit: Payer: Self-pay

## 2018-03-14 ENCOUNTER — Inpatient Hospital Stay: Payer: Medicare PPO | Attending: Internal Medicine

## 2018-03-14 ENCOUNTER — Other Ambulatory Visit: Payer: Self-pay | Admitting: Internal Medicine

## 2018-03-14 ENCOUNTER — Inpatient Hospital Stay (HOSPITAL_BASED_OUTPATIENT_CLINIC_OR_DEPARTMENT_OTHER): Payer: Medicare PPO | Admitting: Internal Medicine

## 2018-03-14 DIAGNOSIS — H409 Unspecified glaucoma: Secondary | ICD-10-CM

## 2018-03-14 DIAGNOSIS — Z800 Family history of malignant neoplasm of digestive organs: Secondary | ICD-10-CM | POA: Diagnosis not present

## 2018-03-14 DIAGNOSIS — K219 Gastro-esophageal reflux disease without esophagitis: Secondary | ICD-10-CM

## 2018-03-14 DIAGNOSIS — D649 Anemia, unspecified: Secondary | ICD-10-CM

## 2018-03-14 DIAGNOSIS — R5383 Other fatigue: Secondary | ICD-10-CM | POA: Insufficient documentation

## 2018-03-14 DIAGNOSIS — N183 Chronic kidney disease, stage 3 (moderate): Secondary | ICD-10-CM | POA: Diagnosis not present

## 2018-03-14 DIAGNOSIS — Z79818 Long term (current) use of other agents affecting estrogen receptors and estrogen levels: Secondary | ICD-10-CM | POA: Diagnosis not present

## 2018-03-14 DIAGNOSIS — I129 Hypertensive chronic kidney disease with stage 1 through stage 4 chronic kidney disease, or unspecified chronic kidney disease: Secondary | ICD-10-CM

## 2018-03-14 DIAGNOSIS — Z923 Personal history of irradiation: Secondary | ICD-10-CM | POA: Diagnosis not present

## 2018-03-14 DIAGNOSIS — E785 Hyperlipidemia, unspecified: Secondary | ICD-10-CM | POA: Diagnosis not present

## 2018-03-14 DIAGNOSIS — R5381 Other malaise: Secondary | ICD-10-CM | POA: Diagnosis not present

## 2018-03-14 DIAGNOSIS — C61 Malignant neoplasm of prostate: Secondary | ICD-10-CM

## 2018-03-14 DIAGNOSIS — Z87891 Personal history of nicotine dependence: Secondary | ICD-10-CM | POA: Insufficient documentation

## 2018-03-14 DIAGNOSIS — E1122 Type 2 diabetes mellitus with diabetic chronic kidney disease: Secondary | ICD-10-CM | POA: Insufficient documentation

## 2018-03-14 DIAGNOSIS — R0602 Shortness of breath: Secondary | ICD-10-CM

## 2018-03-14 DIAGNOSIS — N320 Bladder-neck obstruction: Secondary | ICD-10-CM | POA: Diagnosis not present

## 2018-03-14 DIAGNOSIS — Z7982 Long term (current) use of aspirin: Secondary | ICD-10-CM | POA: Insufficient documentation

## 2018-03-14 DIAGNOSIS — Z79899 Other long term (current) drug therapy: Secondary | ICD-10-CM

## 2018-03-14 LAB — COMPREHENSIVE METABOLIC PANEL
ALBUMIN: 3.4 g/dL — AB (ref 3.5–5.0)
ALT: 11 U/L (ref 0–44)
ANION GAP: 5 (ref 5–15)
AST: 17 U/L (ref 15–41)
Alkaline Phosphatase: 50 U/L (ref 38–126)
BUN: 43 mg/dL — AB (ref 8–23)
CHLORIDE: 114 mmol/L — AB (ref 98–111)
CO2: 23 mmol/L (ref 22–32)
Calcium: 9.5 mg/dL (ref 8.9–10.3)
Creatinine, Ser: 1.57 mg/dL — ABNORMAL HIGH (ref 0.61–1.24)
GFR calc Af Amer: 45 mL/min — ABNORMAL LOW (ref >60)
GFR calc non Af Amer: 39 mL/min — ABNORMAL LOW (ref >60)
Glucose, Bld: 127 mg/dL — ABNORMAL HIGH (ref 70–99)
POTASSIUM: 3.8 mmol/L (ref 3.5–5.1)
Sodium: 142 mmol/L (ref 135–145)
Total Bilirubin: 0.3 mg/dL (ref 0.3–1.2)
Total Protein: 6.9 g/dL (ref 6.5–8.1)

## 2018-03-14 LAB — CBC WITH DIFFERENTIAL/PLATELET
ABS IMMATURE GRANULOCYTES: 0.05 10*3/uL (ref 0.00–0.07)
BASOS PCT: 0 %
Basophils Absolute: 0 10*3/uL (ref 0.0–0.1)
Eosinophils Absolute: 0.1 10*3/uL (ref 0.0–0.5)
Eosinophils Relative: 1 %
HCT: 25.2 % — ABNORMAL LOW (ref 39.0–52.0)
HEMOGLOBIN: 7.4 g/dL — AB (ref 13.0–17.0)
IMMATURE GRANULOCYTES: 1 %
LYMPHS PCT: 14 %
Lymphs Abs: 1.2 10*3/uL (ref 0.7–4.0)
MCH: 27.1 pg (ref 26.0–34.0)
MCHC: 29.4 g/dL — ABNORMAL LOW (ref 30.0–36.0)
MCV: 92.3 fL (ref 80.0–100.0)
MONO ABS: 0.5 10*3/uL (ref 0.1–1.0)
Monocytes Relative: 6 %
NEUTROS ABS: 6.8 10*3/uL (ref 1.7–7.7)
NEUTROS PCT: 78 %
PLATELETS: 292 10*3/uL (ref 150–400)
RBC: 2.73 MIL/uL — AB (ref 4.22–5.81)
RDW: 17.2 % — ABNORMAL HIGH (ref 11.5–15.5)
WBC: 8.6 10*3/uL (ref 4.0–10.5)
nRBC: 0 % (ref 0.0–0.2)

## 2018-03-14 LAB — SAMPLE TO BLOOD BANK

## 2018-03-14 LAB — PREPARE RBC (CROSSMATCH)

## 2018-03-14 NOTE — Assessment & Plan Note (Addendum)
#  Normocytic anemia-unclear etiology; status post bone marrow biopsy.  Today hemoglobin 7.5. plan blood transfusion.  September 2019 iron saturation 12%-recommend p.o. iron.  Not improved and recommend IV iron.  #Etiology of anemia unclear-question rheumatoid arthritis given elevated Anti-CCP; but no obvious clinical signs of RA; no significant improvement on prednisone.  #Bilateral hydro-nephrosis /hydroureter - bladder neck obstruction; followed by urology.  #CKD stage III creatinine 1.5.  Stable. Undergoing urology evaluation.   # Metastatic prostate cancer Hormone sensitive- biochemical recurrence [M0]-  [on Lupron 09/20/2017] -STABLE.   # DISPOSITION: # PRBC transfusion tomorrow.  # follow up in 1 month/ cbc/bmp/hold tube/MD/possible IV venofer.; lupron-Dr.B

## 2018-03-14 NOTE — Progress Notes (Signed)
[facility] OFFICE PROGRESS NOTE  Patient Care Team: Gutierrez, Javier, MD as PCP - General (Family Medicine) Bond, Jeffrey Brent, MD as Consulting Physician (Ophthalmology) Stoioff, Scott C, MD as Consulting Physician (Urology)  Cancer Staging No matching staging information was found for the patient.   Oncology History   # external beam radiation therapy and radiation seed implant in 2005 with Dr. sural and Dr. Goodchild for T1c Gleason 3+4 = 7 prostate cancer with a pretreatment PSA of 6.8. In November 2015, he was found to have biochemical recurrence.  Dec 2016- CT a/p & Bone scan- NEG; PSA- 11; June 2017- 19; STARTED on Firmagon [Dr.Budzyn]; on Lupron q 3M  # OCT 2018- BMBx- hypercellular 30%; relative myeloid hyperplasia/erythroid hypoplasia; FISH- 5/7/8-Normal. II OPINION at [facility].   # JAN 2018- Dexa scan- N     Primary adenocarcinoma of prostate (HCC)      INTERVAL HISTORY:  Thomas Lopez 84 y.o.  male pleasant patient above history of anemia of unclear etiology is here for follow-up.  Patient complains of ongoing fatigue.  Denies any blood in stools black or stools.  He is currently being followed by urology for his hydronephrosis.   Continued shortness of breath and exertion.  Poor appetite.  Review of Systems  Constitutional: Positive for malaise/fatigue and weight loss. Negative for chills, diaphoresis and fever.  HENT: Negative for nosebleeds and sore throat.   Eyes: Negative for double vision.  Respiratory: Positive for shortness of breath. Negative for cough, hemoptysis, sputum production and wheezing.   Cardiovascular: Negative for chest pain, palpitations, orthopnea and leg swelling.  Gastrointestinal: Negative for abdominal pain, blood in stool, constipation, diarrhea, heartburn, melena, nausea and vomiting.  Genitourinary: Negative for dysuria, frequency and urgency.  Musculoskeletal: Negative for back pain and joint pain.  Skin: Negative.   Negative for itching and rash.  Neurological: Negative for dizziness, tingling, focal weakness, weakness and headaches.  Endo/Heme/Allergies: Does not bruise/bleed easily.  Psychiatric/Behavioral: Negative for depression. The patient is not nervous/anxious and does not have insomnia.       PAST MEDICAL HISTORY :  Past Medical History:  Diagnosis Date  . Aorto-iliac atherosclerosis (HCC) 04/2015   by CT scan  . Carotid stenosis 11/2014   mild bilateral 1-39%, rpt 2 yrs  . Colon polyps    rpt colonoscopy due 2014  . ED (erectile dysfunction)    Trimix and VED  . Esophagitis   . Essential hypertension 06/29/2009  . GERD (gastroesophageal reflux disease)   . Glaucoma    severe (Bond)  . HLD (hyperlipidemia)   . HTN (hypertension)   . Hyperglycemia   . IBS (irritable bowel syndrome)   . Prostate cancer (HCC) 2005   s/p seed implant, EBRT (Dr. [place] at [facility]) recurrent treating with androgen deprivation referred to cancer center  . Smoking history     PAST SURGICAL HISTORY :   Past Surgical History:  Procedure Laterality Date  . CATARACT EXTRACTION Left 05/2017   Dr. Bond  . CATARACT EXTRACTION, BILATERAL  December 2016   [facility], Dr. Jeffrey Bond  . COLONOSCOPY  03/01/01   Multiple polyps, divertic//adenomatous polyps  . COLONOSCOPY  04/24/01   multiple polyps  . COLONOSCOPY  10/23/01   Polyps benign-repeat every 2 years  . COLONOSCOPY  06/23/04   Adenom/hyperplastic polyps; multiple external hemorrhoids  . COLONOSCOPY  06/08/07   single small polyp-repeat 5 years  . ESOPHAGOGASTRODUODENOSCOPY  03/01/01   H.H.; gastritis esoph. dudodenitis  .   NCS/Wrists Left Carpal  2/02   Min./right improved  . PFTs  10/2012   FVC 64%, FEV1 41%, ratio 0.62  . PROSTATE BIOPSY  12/04   positive; radioactive seed implant (Dr. Joelyn Oms)  . Prostate Ext Beam Radiation  1/27-07/23/03   Dr. Danny Lawless  . SPIROMETRY  09/2011   WNL  . Wrist Release  6/98; 9/99   Right    FAMILY  HISTORY :   Family History  Problem Relation Age of Onset  . Cancer Mother        colon  . Hyperlipidemia Mother   . Other Mother        Carotid disease  . Coronary artery disease Neg Hx   . Stroke Neg Hx     SOCIAL HISTORY:   Social History   Tobacco Use  . Smoking status: Former Smoker    Last attempt to quit: 05/23/1993    Years since quitting: 24.8  . Smokeless tobacco: Never Used  Substance Use Topics  . Alcohol use: Yes    Comment: Rare  . Drug use: No    ALLERGIES:  has No Known Allergies.  MEDICATIONS:  Current Outpatient Medications  Medication Sig Dispense Refill  . aspirin EC 81 MG tablet Take 81 mg by mouth daily.    . Calcium Carbonate-Vitamin D (CALCIUM 600/VITAMIN D) 600-400 MG-UNIT chew tablet Chew 1 tablet by mouth daily.    . dorzolamide-timolol (COSOPT) 22.3-6.8 MG/ML ophthalmic solution Place 1 drop into the left eye 2 times daily.    . ferrous sulfate 325 (65 FE) MG tablet Take 1 tablet (325 mg total) by mouth daily with breakfast.  3  . leuprolide (LUPRON) 22.5 MG injection Inject 22.5 mg into the muscle every 3 (three) months.    . methylcellulose packet Take 1 each by mouth as needed for constipation. Reported on 11/05/2015    . omeprazole (PRILOSEC) 40 MG capsule Take 1 capsule (40 mg total) by mouth daily. 90 capsule 3  . predniSONE (DELTASONE) 20 MG tablet Take 1 tablet (20 mg total) by mouth daily with breakfast. 30 tablet 0  . rosuvastatin (CRESTOR) 10 MG tablet Take 1 tablet (10 mg total) by mouth daily. 90 tablet 3  . tamsulosin (FLOMAX) 0.4 MG CAPS capsule Take 1 capsule (0.4 mg total) by mouth daily after supper. 20 capsule 0  . venlafaxine XR (EFFEXOR-XR) 37.5 MG 24 hr capsule Take 1 capsule (37.5 mg total) by mouth daily with breakfast. 90 capsule 3  . timolol (TIMOPTIC) 0.5 % ophthalmic solution Apply to eye.     No current facility-administered medications for this visit.     PHYSICAL EXAMINATION: ECOG PERFORMANCE STATUS: 1 -  Symptomatic but completely ambulatory  BP (!) 155/62 (BP Location: Left Arm, Patient Position: Sitting)   Pulse (!) 58   Temp (!) 95.6 F (35.3 C) (Tympanic)   Resp 18   Wt 148 lb 9.6 oz (67.4 kg)   BMI 21.94 kg/m   Filed Weights   03/14/18 1031  Weight: 148 lb 9.6 oz (67.4 kg)    Physical Exam  Constitutional: He is oriented to person, place, and time.  Thin built cachectic appearing male patient accompanied by his wife.  Walking by himself.  Appears pale.  HENT:  Head: Normocephalic and atraumatic.  Mouth/Throat: Oropharynx is clear and moist. No oropharyngeal exudate.  Eyes: Pupils are equal, round, and reactive to light.  Neck: Normal range of motion. Neck supple.  Cardiovascular: Normal rate and regular rhythm.  Pulmonary/Chest: No  respiratory distress. He has no wheezes.  Abdominal: Soft. Bowel sounds are normal. He exhibits no distension and no mass. There is no tenderness. There is no rebound and no guarding.  Musculoskeletal: Normal range of motion. He exhibits no edema or tenderness.  Neurological: He is alert and oriented to person, place, and time.  Skin: Skin is warm. There is pallor.  Psychiatric: Affect normal.       LABORATORY DATA:  I have reviewed the data as listed    Component Value Date/Time   NA 142 03/14/2018 1011   K 3.8 03/14/2018 1011   CL 114 (H) 03/14/2018 1011   CO2 23 03/14/2018 1011   GLUCOSE 127 (H) 03/14/2018 1011   BUN 43 (H) 03/14/2018 1011   CREATININE 1.57 (H) 03/14/2018 1011   CALCIUM 9.5 03/14/2018 1011   PROT 6.9 03/14/2018 1011   ALBUMIN 3.4 (L) 03/14/2018 1011   AST 17 03/14/2018 1011   ALT 11 03/14/2018 1011   ALKPHOS 50 03/14/2018 1011   BILITOT 0.3 03/14/2018 1011   GFRNONAA 39 (L) 03/14/2018 1011   GFRAA 45 (L) 03/14/2018 1011    No results found for: SPEP, UPEP  Lab Results  Component Value Date   WBC 8.6 03/14/2018   NEUTROABS 6.8 03/14/2018   HGB 7.4 (L) 03/14/2018   HCT 25.2 (L) 03/14/2018   MCV 92.3  03/14/2018   PLT 292 03/14/2018      Chemistry      Component Value Date/Time   NA 142 03/14/2018 1011   K 3.8 03/14/2018 1011   CL 114 (H) 03/14/2018 1011   CO2 23 03/14/2018 1011   BUN 43 (H) 03/14/2018 1011   CREATININE 1.57 (H) 03/14/2018 1011      Component Value Date/Time   CALCIUM 9.5 03/14/2018 1011   ALKPHOS 50 03/14/2018 1011   AST 17 03/14/2018 1011   ALT 11 03/14/2018 1011   BILITOT 0.3 03/14/2018 1011       RADIOGRAPHIC STUDIES: I have personally reviewed the radiological images as listed and agreed with the findings in the report. No results found.   ASSESSMENT & PLAN:  Normocytic anemia #Normocytic anemia-unclear etiology; status post bone marrow biopsy.  Today hemoglobin 7.5. plan blood transfusion.  September 2019 iron saturation 12%-recommend p.o. iron.  Not improved and recommend IV iron.  #Etiology of anemia unclear-question rheumatoid arthritis given elevated Anti-CCP; but no obvious clinical signs of RA; no significant improvement on prednisone.  #Bilateral hydro-nephrosis /hydroureter - bladder neck obstruction; followed by urology.  #CKD stage III creatinine 1.5.  Stable. Undergoing urology evaluation.   # Metastatic prostate cancer Hormone sensitive- biochemical recurrence [M0]-  [on Lupron 09/20/2017] -STABLE.   # DISPOSITION: # PRBC transfusion tomorrow.  # follow up in 1 month/ cbc/bmp/hold tube/MD/possible IV venofer.; lupron-Dr.B   No orders of the defined types were placed in this encounter.  All questions were answered. The patient knows to call the clinic with any problems, questions or concerns.      Cammie Sickle, MD 03/14/2018 5:07 PM

## 2018-03-14 NOTE — Progress Notes (Signed)
Here today for follow up. Stated feeling " fine " after UTI tx-has f/u w urologist this Friday 9 no idea name ) feeling better after infection. Difficulty having problems w incontinence and diff initiating urination.

## 2018-03-15 ENCOUNTER — Inpatient Hospital Stay: Payer: Medicare PPO

## 2018-03-15 DIAGNOSIS — Z923 Personal history of irradiation: Secondary | ICD-10-CM | POA: Diagnosis not present

## 2018-03-15 DIAGNOSIS — D649 Anemia, unspecified: Secondary | ICD-10-CM | POA: Diagnosis not present

## 2018-03-15 DIAGNOSIS — I129 Hypertensive chronic kidney disease with stage 1 through stage 4 chronic kidney disease, or unspecified chronic kidney disease: Secondary | ICD-10-CM | POA: Diagnosis not present

## 2018-03-15 DIAGNOSIS — Z79818 Long term (current) use of other agents affecting estrogen receptors and estrogen levels: Secondary | ICD-10-CM | POA: Diagnosis not present

## 2018-03-15 DIAGNOSIS — E1122 Type 2 diabetes mellitus with diabetic chronic kidney disease: Secondary | ICD-10-CM | POA: Diagnosis not present

## 2018-03-15 DIAGNOSIS — H401133 Primary open-angle glaucoma, bilateral, severe stage: Secondary | ICD-10-CM | POA: Diagnosis not present

## 2018-03-15 DIAGNOSIS — R5383 Other fatigue: Secondary | ICD-10-CM | POA: Diagnosis not present

## 2018-03-15 DIAGNOSIS — R5381 Other malaise: Secondary | ICD-10-CM | POA: Diagnosis not present

## 2018-03-15 DIAGNOSIS — C61 Malignant neoplasm of prostate: Secondary | ICD-10-CM | POA: Diagnosis not present

## 2018-03-15 DIAGNOSIS — R0602 Shortness of breath: Secondary | ICD-10-CM | POA: Diagnosis not present

## 2018-03-15 MED ORDER — SODIUM CHLORIDE 0.9% IV SOLUTION
250.0000 mL | Freq: Once | INTRAVENOUS | Status: AC
  Administered 2018-03-15: 250 mL via INTRAVENOUS
  Filled 2018-03-15: qty 250

## 2018-03-15 MED ORDER — DIPHENHYDRAMINE HCL 25 MG PO CAPS
25.0000 mg | ORAL_CAPSULE | Freq: Once | ORAL | Status: AC
  Administered 2018-03-15: 25 mg via ORAL
  Filled 2018-03-15: qty 1

## 2018-03-15 MED ORDER — ACETAMINOPHEN 325 MG PO TABS
650.0000 mg | ORAL_TABLET | Freq: Once | ORAL | Status: AC
  Administered 2018-03-15: 650 mg via ORAL
  Filled 2018-03-15: qty 2

## 2018-03-15 NOTE — Progress Notes (Signed)
Patient received one unit of blood today. Tolerated well, no complaints. BP elevated today throughout transfusion. Dr Rogue Bussing made aware. Patient instructed to follow-up with primary care provider regarding elevated BP. Patient verbalized understanding.

## 2018-03-16 ENCOUNTER — Other Ambulatory Visit: Payer: Self-pay | Admitting: Radiology

## 2018-03-16 ENCOUNTER — Ambulatory Visit (INDEPENDENT_AMBULATORY_CARE_PROVIDER_SITE_OTHER): Payer: Medicare PPO | Admitting: Urology

## 2018-03-16 ENCOUNTER — Encounter: Payer: Self-pay | Admitting: Urology

## 2018-03-16 VITALS — BP 166/69 | HR 58 | Ht 69.0 in | Wt 145.8 lb

## 2018-03-16 DIAGNOSIS — R399 Unspecified symptoms and signs involving the genitourinary system: Secondary | ICD-10-CM

## 2018-03-16 DIAGNOSIS — R339 Retention of urine, unspecified: Secondary | ICD-10-CM | POA: Diagnosis not present

## 2018-03-16 DIAGNOSIS — N138 Other obstructive and reflux uropathy: Secondary | ICD-10-CM

## 2018-03-16 DIAGNOSIS — N401 Enlarged prostate with lower urinary tract symptoms: Principal | ICD-10-CM

## 2018-03-16 LAB — TYPE AND SCREEN
ABO/RH(D): A POS
Antibody Screen: NEGATIVE
UNIT DIVISION: 0

## 2018-03-16 LAB — BPAM RBC
BLOOD PRODUCT EXPIRATION DATE: 201911222359
ISSUE DATE / TIME: 201910240956
UNIT TYPE AND RH: 6200

## 2018-03-16 LAB — BLADDER SCAN AMB NON-IMAGING

## 2018-03-16 NOTE — H&P (View-Only) (Signed)
03/16/2018 2:10 PM   Thomas Lopez July 13, 1932 [id number]  Referring provider: [name], MD [address] [place], [place] [address]  Chief Complaint  Patient presents with  . Urinary Retention    HPI: 84 year old male with a history of prostate cancer and was treated with combination EBRT/brachytherapy in 2005 for moderate risk prostate cancer.  He had biochemical recurrence in November 2015. He was started on ADT in 2017.  He had previously been followed by Dr. Pilar Jarvis and I saw him in May 2019 for bilateral hydronephrosis on CT scan.  He was found to be in urinary retention however his hydronephrosis did not resolve with Foley catheter drainage.  A Lasix renal scan showed no evidence of obstruction.  He failed 2 voiding trials and subsequently began voiding spontaneously however residuals have been between 3 and 500 mL.  Cystoscopy showed mild lateral lobe enlargement.  Urodynamic study was performed in [place] on 03/07/2018.  His first sensation was 373 mL, normal desire at 404 mL and fullness at 415 mL.  He was able to achieve a sustained detrusor contraction with low flow.  I reviewed the urodynamic study with Dr. Matilde Sprang who felt this was consistent with outlet obstruction.  Prostate volume by prior CT is approximately 40 g.  IPSS completed today was 21/35 with a quality of life rated 6/6.  Symptoms include frequency, intermittency, urgency, weak stream, straining to urinate and nocturia x3.  He does have nighttime incontinence which may be related to overflow.  PVR by bladder scan today was 334 mL.   PMH: Past Medical History:  Diagnosis Date  . Aorto-iliac atherosclerosis ([place]) 04/2015   by CT scan  . Carotid stenosis 11/2014   mild bilateral 1-39%, rpt 2 yrs  . Colon polyps    rpt colonoscopy due 2014  . ED (erectile dysfunction)    Trimix and VED  . Esophagitis   . Essential hypertension 06/29/2009  . GERD (gastroesophageal reflux disease)   .  Glaucoma    severe (B[facility])  . HLD (hyperlipidemia)   . HTN (hypertension)   . Hyperglycemia   . IBS (irritable bowel syndrome)   . Prostate cancer [facility]) 2005   s/p seed implant, EBRT ([facility]. Alinda [facility] at [facility]) recurrent treating with androgen deprivation referred to cancer center  . Smoking history     Surgical History: Past Surgical History:  Procedure Laterality Date  . CATARACT EXTRACTION Left 05/2017   Dr. Edilia Bo  . CATARACT EXTRACTION, BILATERAL  December 2016   [place], Dr. Raynelle Fanning  . COLONOSCOPY  03/01/01   Multiple polyps, divertic//adenomatous polyps  . COLONOSCOPY  04/24/01   multiple polyps  . COLONOSCOPY  10/23/01   Polyps benign-repeat every 2 years  . COLONOSCOPY  06/23/04   Adenom/hyperplastic polyps; multiple external hemorrhoids  . COLONOSCOPY  06/08/07   single small polyp-repeat 5 years  . ESOPHAGOGASTRODUODENOSCOPY  03/01/01   H.H.; gastritis esoph. dudodenitis  . NCS/Wrists Left Carpal  2/02   Min./right improved  . PFTs  10/2012   FVC 64%, FEV1 41%, ratio 0.62  . PROSTATE BIOPSY  12/04   positive; radioactive seed implant (Dr. Joelyn Oms)  . Prostate Ext Beam Radiation  1/27-07/23/03   Dr. Danny Lawless  . SPIROMETRY  09/2011   WNL  . Wrist Release  6/98; 9/99   Right    Home Medications:  Allergies as of 03/16/2018   No Known Allergies     Medication List  Accurate as of 03/16/18  2:10 PM. Always use your most recent med list.          aspirin EC 81 MG tablet Take 81 mg by mouth daily.   Calcium Carbonate-Vitamin D 600-400 MG-UNIT chew tablet Chew 1 tablet by mouth daily.   dorzolamide-timolol 22.3-6.8 MG/ML ophthalmic solution Commonly known as:  COSOPT Place 1 drop into the left eye 2 times daily.   ferrous sulfate 325 (65 FE) MG tablet Take 1 tablet (325 mg total) by mouth daily with breakfast.   leuprolide 22.5 MG injection Commonly known as:  LUPRON Inject 22.5 mg into the muscle every 3 (three) months.     methylcellulose packet Take 1 each by mouth as needed for constipation. Reported on 11/05/2015   omeprazole 40 MG capsule Commonly known as:  PRILOSEC Take 1 capsule (40 mg total) by mouth daily.   predniSONE 20 MG tablet Commonly known as:  DELTASONE Take 1 tablet (20 mg total) by mouth daily with breakfast.   rosuvastatin 10 MG tablet Commonly known as:  CRESTOR Take 1 tablet (10 mg total) by mouth daily.   tamsulosin 0.4 MG Caps capsule Commonly known as:  FLOMAX Take 1 capsule (0.4 mg total) by mouth daily after supper.   timolol 0.5 % ophthalmic solution Commonly known as:  TIMOPTIC Apply to eye.   venlafaxine XR 37.5 MG 24 hr capsule Commonly known as:  EFFEXOR-XR Take 1 capsule (37.5 mg total) by mouth daily with breakfast.       Allergies: No Known Allergies  Family History: Family History  Problem Relation Age of Onset  . Cancer Mother        colon  . Hyperlipidemia Mother   . Other Mother        Carotid disease  . Coronary artery disease Neg Hx   . Stroke Neg Hx     Social History:  reports that he quit smoking about 24 years ago. He has never used smokeless tobacco. He reports that he drinks alcohol. He reports that he does not use drugs.  ROS: UROLOGY Frequent Urination?: Yes Hard to postpone urination?: Yes Burning/pain with urination?: No Get up at night to urinate?: Yes Leakage of urine?: Yes Urine stream starts and stops?: No Trouble starting stream?: Yes Do you have to strain to urinate?: No Blood in urine?: No Urinary tract infection?: No Sexually transmitted disease?: No Injury to kidneys or bladder?: No Painful intercourse?: No Weak stream?: Yes Erection problems?: Yes Penile pain?: No  Gastrointestinal Nausea?: No Vomiting?: No Indigestion/heartburn?: No Diarrhea?: No Constipation?: No  Constitutional Fever: No Night sweats?: No Weight loss?: No Fatigue?: No  Skin Skin rash/lesions?: No Itching?: No  Eyes Blurred  vision?: No Double vision?: No  Ears/Nose/Throat Sore throat?: No Sinus problems?: No  Hematologic/Lymphatic Swollen glands?: No Easy bruising?: No  Cardiovascular Leg swelling?: No Chest pain?: No  Respiratory Cough?: No Shortness of breath?: No  Endocrine Excessive thirst?: No  Musculoskeletal Back pain?: No Joint pain?: No  Neurological Headaches?: No Dizziness?: No  Psychologic Depression?: No Anxiety?: No  Physical Exam: BP (!) 166/69 (BP Location: Left Arm, Patient Position: Sitting, Cuff Size: Normal)   Pulse (!) 58   Ht 5\' 9"  (1.753 m)   Wt 145 lb 12.8 oz (66.1 kg)   BMI 21.53 kg/m   Constitutional:  Alert and oriented, No acute distress. HEENT: [place] AT, moist mucus membranes.  Trachea midline, no masses. Cardiovascular: No clubbing, cyanosis, or edema.  RRR Respiratory: Normal respiratory  effort, no increased work of breathing.  Lungs clear GI: Abdomen is soft, nontender, nondistended, no abdominal masses GU: No CVA tenderness Lymph: No cervical or inguinal lymphadenopathy. Skin: No rashes, bruises or suspicious lesions. Neurologic: Grossly intact, no focal deficits, moving all 4 extremities. Psychiatric: Normal mood and affect.  Assessment & Plan:   84 year old male with bothersome lower urinary tract symptoms, incomplete bladder emptying and urodynamic study felt consistent with outlet obstruction.  He has been on an alpha-blocker without improvement and did have some dizziness and hypertension.  TURP was discussed however with prior radiation he is an increased risk for urinary incontinence.  You were minimally invasive procedures including UroLift were discussed.  He is interested in pursuing UroLift.  The procedure was discussed in detail.  It was stressed there is no guarantee that an outlet procedure including UroLift will resolve or improve his urinary symptoms or bladder emptying.  We discussed potential side effects including frequency, urgency,  burning and hematuria.  He indicated all questions were answered and desires to proceed.   Abbie Sons, [place] [address], [place], [place] [address] [phone]

## 2018-03-16 NOTE — Progress Notes (Signed)
03/16/2018 2:10 PM   Thomas Lopez Oct 11, 1932 [id number]  Referring provider: [name], MD [address] [place], [place] [address]  Chief Complaint  Patient presents with  . Urinary Retention    HPI: 84 year old male with a history of prostate cancer and was treated with combination EBRT/brachytherapy in 2005 for moderate risk prostate cancer.  He had biochemical recurrence in November 2015. He was started on ADT in 2017.  He had previously been followed by Dr. Pilar Jarvis and I saw him in May 2019 for bilateral hydronephrosis on CT scan.  He was found to be in urinary retention however his hydronephrosis did not resolve with Foley catheter drainage.  A Lasix renal scan showed no evidence of obstruction.  He failed 2 voiding trials and subsequently began voiding spontaneously however residuals have been between 3 and 500 mL.  Cystoscopy showed mild lateral lobe enlargement.  Urodynamic study was performed in [place] on 03/07/2018.  His first sensation was 373 mL, normal desire at 404 mL and fullness at 415 mL.  He was able to achieve a sustained detrusor contraction with low flow.  I reviewed the urodynamic study with Dr. Matilde Sprang who felt this was consistent with outlet obstruction.  Prostate volume by prior CT is approximately 40 g.  IPSS completed today was 21/35 with a quality of life rated 6/6.  Symptoms include frequency, intermittency, urgency, weak stream, straining to urinate and nocturia x3.  He does have nighttime incontinence which may be related to overflow.  PVR by bladder scan today was 334 mL.   PMH: Past Medical History:  Diagnosis Date  . Aorto-iliac atherosclerosis ([place]) 04/2015   by CT scan  . Carotid stenosis 11/2014   mild bilateral 1-39%, rpt 2 yrs  . Colon polyps    rpt colonoscopy due 2014  . ED (erectile dysfunction)    Trimix and VED  . Esophagitis   . Essential hypertension 06/29/2009  . GERD (gastroesophageal reflux disease)   .  Glaucoma    severe ([place])  . HLD (hyperlipidemia)   . HTN (hypertension)   . Hyperglycemia   . IBS (irritable bowel syndrome)   . Prostate cancer [facility]) 2005   s/p seed implant, EBRT (Dr. Alinda Money at [facility]) recurrent treating with androgen deprivation referred to cancer center  . Smoking history     Surgical History: Past Surgical History:  Procedure Laterality Date  . CATARACT EXTRACTION Left 05/2017   Dr. Edilia Bo  . CATARACT EXTRACTION, BILATERAL  December 2016   [place], Dr. Raynelle Fanning  . COLONOSCOPY  03/01/01   Multiple polyps, divertic//adenomatous polyps  . COLONOSCOPY  04/24/01   multiple polyps  . COLONOSCOPY  10/23/01   Polyps benign-repeat every 2 years  . COLONOSCOPY  06/23/04   [place]/hyperplastic polyps; multiple external hemorrhoids  . COLONOSCOPY  06/08/07   single small polyp-repeat 5 years  . ESOPHAGOGASTRODUODENOSCOPY  03/01/01   H.H.; gastritis esoph. dudodenitis  . NCS/Wrists Left Carpal  2/02   Min./right improved  . PFTs  10/2012   FVC 64%, FEV1 41%, ratio 0.62  . PROSTATE BIOPSY  12/04   positive; radioactive seed implant (Dr. Joelyn Oms)  . Prostate Ext Beam Radiation  1/27-07/23/03   Dr. Danny Lawless  . SPIROMETRY  09/2011   WNL  . Wrist Release  6/98; 9/99   Right    Home Medications:  Allergies as of 03/16/2018   No Known Allergies     Medication List  Accurate as of 03/16/18  2:10 PM. Always use your most recent med list.          aspirin EC 81 MG tablet Take 81 mg by mouth daily.   Calcium Carbonate-Vitamin D 600-400 MG-UNIT chew tablet Chew 1 tablet by mouth daily.   dorzolamide-timolol 22.3-6.8 MG/ML ophthalmic solution Commonly known as:  COSOPT Place 1 drop into the left eye 2 times daily.   ferrous sulfate 325 (65 FE) MG tablet Take 1 tablet (325 mg total) by mouth daily with breakfast.   leuprolide 22.5 MG injection Commonly known as:  LUPRON Inject 22.5 mg into the muscle every 3 (three) months.     methylcellulose packet Take 1 each by mouth as needed for constipation. Reported on 11/05/2015   omeprazole 40 MG capsule Commonly known as:  PRILOSEC Take 1 capsule (40 mg total) by mouth daily.   predniSONE 20 MG tablet Commonly known as:  DELTASONE Take 1 tablet (20 mg total) by mouth daily with breakfast.   rosuvastatin 10 MG tablet Commonly known as:  CRESTOR Take 1 tablet (10 mg total) by mouth daily.   tamsulosin 0.4 MG Caps capsule Commonly known as:  FLOMAX Take 1 capsule (0.4 mg total) by mouth daily after supper.   timolol 0.5 % ophthalmic solution Commonly known as:  TIMOPTIC Apply to eye.   venlafaxine XR 37.5 MG 24 hr capsule Commonly known as:  EFFEXOR-XR Take 1 capsule (37.5 mg total) by mouth daily with breakfast.       Allergies: No Known Allergies  Family History: Family History  Problem Relation Age of Onset  . Cancer Mother        colon  . Hyperlipidemia Mother   . Other Mother        Carotid disease  . Coronary artery disease Neg Hx   . Stroke Neg Hx     Social History:  reports that he quit smoking about 24 years ago. He has never used smokeless tobacco. He reports that he drinks alcohol. He reports that he does not use drugs.  ROS: UROLOGY Frequent Urination?: Yes Hard to postpone urination?: Yes Burning/pain with urination?: No Get up at night to urinate?: Yes Leakage of urine?: Yes Urine stream starts and stops?: No Trouble starting stream?: Yes Do you have to strain to urinate?: No Blood in urine?: No Urinary tract infection?: No Sexually transmitted disease?: No Injury to kidneys or bladder?: No Painful intercourse?: No Weak stream?: Yes Erection problems?: Yes Penile pain?: No  Gastrointestinal Nausea?: No Vomiting?: No Indigestion/heartburn?: No Diarrhea?: No Constipation?: No  Constitutional Fever: No Night sweats?: No Weight loss?: No Fatigue?: No  Skin Skin rash/lesions?: No Itching?: No  Eyes Blurred  vision?: No Double vision?: No  Ears/Nose/Throat Sore throat?: No Sinus problems?: No  Hematologic/Lymphatic Swollen glands?: No Easy bruising?: No  Cardiovascular Leg swelling?: No Chest pain?: No  Respiratory Cough?: No Shortness of breath?: No  Endocrine Excessive thirst?: No  Musculoskeletal Back pain?: No Joint pain?: No  Neurological Headaches?: No Dizziness?: No  Psychologic Depression?: No Anxiety?: No  Physical Exam: BP (!) 166/69 (BP Location: Left Arm, Patient Position: Sitting, Cuff Size: Normal)   Pulse (!) 58   Ht 5\' 9"  (1.753 m)   Wt 145 lb 12.8 oz (66.1 kg)   BMI 21.53 kg/m   Constitutional:  Alert and oriented, No acute distress. HEENT: [place] AT, moist mucus membranes.  Trachea midline, no masses. Cardiovascular: No clubbing, cyanosis, or edema.  RRR Respiratory: Normal respiratory  effort, no increased work of breathing.  Lungs clear GI: Abdomen is soft, nontender, nondistended, no abdominal masses GU: No CVA tenderness Lymph: No cervical or inguinal lymphadenopathy. Skin: No rashes, bruises or suspicious lesions. Neurologic: Grossly intact, no focal deficits, moving all 4 extremities. Psychiatric: Normal mood and affect.  Assessment & Plan:   84 year old male with bothersome lower urinary tract symptoms, incomplete bladder emptying and urodynamic study felt consistent with outlet obstruction.  He has been on an alpha-blocker without improvement and did have some dizziness and hypertension.  TURP was discussed however with prior radiation he is an increased risk for urinary incontinence.  You were minimally invasive procedures including UroLift were discussed.  He is interested in pursuing UroLift.  The procedure was discussed in detail.  It was stressed there is no guarantee that an outlet procedure including UroLift will resolve or improve his urinary symptoms or bladder emptying.  We discussed potential side effects including frequency, urgency,  burning and hematuria.  He indicated all questions were answered and desires to proceed.   Abbie Sons, [place] [address], [place], [place] [address] [phone]

## 2018-03-19 ENCOUNTER — Encounter: Payer: Self-pay | Admitting: Urology

## 2018-03-23 ENCOUNTER — Telehealth: Payer: Self-pay | Admitting: Radiology

## 2018-03-23 NOTE — Telephone Encounter (Signed)
Patient was given the [facility] Information form below as well as the Instructions for Pre-Admission Testing form & a map of [facility].   [place], [place], [place] [address] Telephone: [phone] Fax: [phone]   Thank you for choosing [facility] for your upcoming surgery!  We are always here to assist in your urological needs.  Please read the following information with specific details for your upcoming appointments related to your surgery. Please contact [facility] at [phone] Option 3 with any questions.  The Name of Your Surgery: Urolift insertion Your Surgery Date: 04/10/2018 Your Surgeon: John Giovanni  Please call Same Day Surgery at [phone] between the hours of 1pm-3pm one day prior to your surgery. They will inform you of the time to arrive at Same Day Surgery which is located on the second floor of the [facility].   Please refer to the attached letter regarding instructions for Pre-Admission Testing. You will receive a call from the [place] office regarding your appointment with them.  The Pre-Admission Testing office is located at [place], on the first floor of the [facility] in [place] (office is to the right as you enter through the [employer] of the [facility]). Please have all medications you are currently taking and your insurance card available.   Patient was advised to have nothing to eat or drink after midnight the night prior to surgery except that he may have only water until 2 hours before surgery with nothing to drink within 2 hours of surgery.  The patient states he currently takes aspirin 81mg  & was informed to hold aspirin for 7 days prior to surgery beginning on 04/03/2018. Patient was also advised of Pre-Admission Testing appointment on 03/29/2018 at  10:00. Patient's questions were answered and he expressed understanding of these instructions.

## 2018-03-28 ENCOUNTER — Encounter: Payer: Self-pay | Admitting: Family Medicine

## 2018-03-28 NOTE — Progress Notes (Addendum)
BP (!) 200/80 (BP Location: Right Arm, Cuff Size: Normal)   Pulse 63   Temp 97.6 F (36.4 C) (Oral)   Ht 5' 9"  (1.753 m)   Wt 148 lb 12 oz (67.5 kg)   SpO2 99%   BMI 21.97 kg/m   No data found.  orthostatics negative - bp remains elevated CC: 3 mo f/u visit, preop eval Subjective:    Patient ID: Thomas Lopez, male    DOB: [birth year], 84 y.o.   MRN: [id number]  HPI: Thomas Lopez is a 84 y.o. male presenting on 03/29/2018 for Pre-op Exam   See prior note for details.   Planned urolift insertion on 04/10/2018 by Dr Bernardo Heater for obstructive BPH. He requested preop evaluation for h/o CAD, abnormal EKG and emphysema. States he has preop testing planned at the hospital today - anticipate will get labs there.   Continues seeing heme/onc for anemia of unknown cause s/p bone marrow biopsy and CT scans. Saw rheumatology s/p trial of prednisone for ?RA, no improvement noted. Due for rheum f/u.   Known metastatic prostate cancer.   BP at home 488-891 systolic. Denies dizziness, chest pain/tightness, dyspnea or cough. Lisinopril was stopped 12/2017 due to hypotension and dizziness.   Emphysema by CT - ex smoker quit 1995, smoked ~30 PY.  Glaucoma stable on drops - no planned interventions.   Relevant past medical, surgical, family and social history reviewed and updated as indicated. Interim medical history since our last visit reviewed. Allergies and medications reviewed and updated. Outpatient Medications Prior to Visit  Medication Sig Dispense Refill  . aspirin EC 81 MG tablet Take 81 mg by mouth daily.    . Calcium Carbonate-Vitamin D (CALCIUM 600/VITAMIN D) 600-400 MG-UNIT chew tablet Chew 1 tablet by mouth daily.    . ferrous sulfate 325 (65 FE) MG tablet Take 1 tablet (325 mg total) by mouth daily with breakfast.  3  . leuprolide (LUPRON) 22.5 MG injection Inject 22.5 mg into the muscle every 3 (three) months.    [place] omeprazole (PRILOSEC) 40 MG capsule Take 1 capsule (40 mg total)  by mouth daily. 90 capsule 3  . rosuvastatin (CRESTOR) 10 MG tablet Take 1 tablet (10 mg total) by mouth daily. 90 tablet 3  . timolol (TIMOPTIC) 0.5 % ophthalmic solution Place 1 drop into the left eye 2 (two) times daily.     [place] venlafaxine XR (EFFEXOR-XR) 37.5 MG 24 hr capsule Take 1 capsule (37.5 mg total) by mouth daily with breakfast. 90 capsule 3  . tamsulosin (FLOMAX) 0.4 MG CAPS capsule Take 1 capsule (0.4 mg total) by mouth daily after supper. 20 capsule 0   No facility-administered medications prior to visit.      Per HPI unless specifically indicated in ROS section below Review of Systems     Objective:    BP (!) 200/80 (BP Location: Right Arm, Cuff Size: Normal)   Pulse 63   Temp 97.6 F (36.4 C) (Oral)   Ht 5' 9"  (1.753 m)   Wt 148 lb 12 oz (67.5 kg)   SpO2 99%   BMI 21.97 kg/m   Wt Readings from Last 3 Encounters:  03/29/18 148 lb 5 oz (67.3 kg)  03/29/18 148 lb 12 oz (67.5 kg)  03/16/18 145 lb 12.8 oz (66.1 kg)    Physical Exam  Constitutional: He appears well-developed and well-nourished. No distress.  HENT:  Mouth/Throat: Oropharynx is clear and moist. No oropharyngeal exudate.  Cardiovascular: Normal rate, regular rhythm  and normal heart sounds.  No murmur heard. Pulmonary/Chest: Effort normal and breath sounds normal. No respiratory distress. He has no wheezes. He has no rales.  Musculoskeletal: He exhibits no edema.  Psychiatric: He has a normal mood and affect.  Nursing note and vitals reviewed.  Results for orders placed or performed in visit on 03/16/18  Bladder Scan (Post Void Residual) in office  Result Value Ref Range   Scan Result 358m    EKG - sinus arrhythmia, normal axis, intervals, no acute ST/T changes, p mitrale    Assessment & Plan:   Problem List Items Addressed This Visit    Urinary retention    Off foley for several months. Ongoing incomplete emptying. Planned urolift.       Primary adenocarcinoma of prostate ([facility]   Pre-op  exam - Primary    RCRI = 0 EKG and CXR today in h/o smoker and emphysema by CT.  I do want to lower blood pressures prior to upcoming procedure -- RTC 1 wk recheck bp.  Assuming stable evaluation and improved BP, should be reasonably cleared to proceed with urologic intervention for symptomatic BPH.       Relevant Orders   EKG 12-Lead (Completed)   DG Chest 2 View (Completed)   Normocytic anemia   History of smoking   Relevant Orders   DG Chest 2 View (Completed)   Essential hypertension    Lisinopril stopped 12/2017 due to hypotension/dizziness. Recent BP readings too high. Check orthostatics today - stable but remains hypertensive. Initially started considered cardura for BPH symptoms, however given prior concern for orthostasis, elected to start lisinopril 565mdaily (prior on 1012m I did ask him to return in 7-10 days for labs and bp check prior to surgery. Pt agrees with plan..       Relevant Medications   lisinopril (PRINIVIL,ZESTRIL) 5 MG tablet   Emphysema of lung (HCC)    asxs from respiratory standpoint.       Relevant Orders   DG Chest 2 View (Completed)   CKD (chronic kidney disease) stage 3, GFR 30-59 ml/min (HCC)   Abnormal EKG    Reviewed EKG from prior evaluation read as afib - poor quality, and there were p waves present. Will repeat EKG today.        Other Visit Diagnoses    Need for influenza vaccination       Relevant Orders   Flu Vaccine QUAD 36+ mos IM (Completed)       Meds ordered this encounter  Medications  . DISCONTD: doxazosin (CARDURA) 1 MG tablet    Sig: Take 1 tablet (1 mg total) by mouth daily.    Dispense:  30 tablet    Refill:  3  . lisinopril (PRINIVIL,ZESTRIL) 5 MG tablet    Sig: Take 1 tablet (5 mg total) by mouth daily.    Dispense:  30 tablet    Refill:  3   Orders Placed This Encounter  Procedures  . DG Chest 2 View    Standing Status:   Future    Number of Occurrences:   1    Standing Expiration Date:   05/30/2019    Order  Specific Question:   Reason for Exam (SYMPTOM  OR DIAGNOSIS REQUIRED)    Answer:   COPD preop    Order Specific Question:   Preferred imaging location?    Answer:   [facility] Order Specific Question:   Radiology Contrast Protocol - do NOT remove  file path    Answer:   \\[email or web address].[email or web address]  . Flu Vaccine QUAD 36+ mos IM  . EKG 12-Lead    Follow up plan: Return for follow up visit.  [name], MD

## 2018-03-29 ENCOUNTER — Ambulatory Visit (INDEPENDENT_AMBULATORY_CARE_PROVIDER_SITE_OTHER): Payer: Medicare PPO | Admitting: Family Medicine

## 2018-03-29 ENCOUNTER — Encounter
Admission: RE | Admit: 2018-03-29 | Discharge: 2018-03-29 | Disposition: A | Discharge status: Home / Self Care | Payer: Medicare PPO | Source: Ambulatory Visit | Attending: Urology | Admitting: Urology

## 2018-03-29 ENCOUNTER — Ambulatory Visit (INDEPENDENT_AMBULATORY_CARE_PROVIDER_SITE_OTHER)
Admission: RE | Admit: 2018-03-29 | Discharge: 2018-03-29 | Disposition: A | Discharge status: Home / Self Care | Payer: Medicare PPO | Source: Ambulatory Visit | Attending: Family Medicine | Admitting: Family Medicine

## 2018-03-29 ENCOUNTER — Encounter: Payer: Self-pay | Admitting: Family Medicine

## 2018-03-29 ENCOUNTER — Other Ambulatory Visit: Payer: Self-pay

## 2018-03-29 VITALS — BP 200/80 | HR 63 | Temp 97.6°F | Ht 69.0 in | Wt 148.8 lb

## 2018-03-29 DIAGNOSIS — C61 Malignant neoplasm of prostate: Secondary | ICD-10-CM | POA: Diagnosis not present

## 2018-03-29 DIAGNOSIS — Z01818 Encounter for other preprocedural examination: Secondary | ICD-10-CM | POA: Diagnosis not present

## 2018-03-29 DIAGNOSIS — N183 Chronic kidney disease, stage 3 unspecified: Secondary | ICD-10-CM

## 2018-03-29 DIAGNOSIS — R339 Retention of urine, unspecified: Secondary | ICD-10-CM

## 2018-03-29 DIAGNOSIS — Z01812 Encounter for preprocedural laboratory examination: Secondary | ICD-10-CM | POA: Insufficient documentation

## 2018-03-29 DIAGNOSIS — Z23 Encounter for immunization: Secondary | ICD-10-CM

## 2018-03-29 DIAGNOSIS — J439 Emphysema, unspecified: Secondary | ICD-10-CM | POA: Diagnosis not present

## 2018-03-29 DIAGNOSIS — R9431 Abnormal electrocardiogram [ECG] [EKG]: Secondary | ICD-10-CM

## 2018-03-29 DIAGNOSIS — Z87891 Personal history of nicotine dependence: Secondary | ICD-10-CM | POA: Diagnosis not present

## 2018-03-29 DIAGNOSIS — D649 Anemia, unspecified: Secondary | ICD-10-CM

## 2018-03-29 DIAGNOSIS — I10 Essential (primary) hypertension: Secondary | ICD-10-CM

## 2018-03-29 DIAGNOSIS — R0989 Other specified symptoms and signs involving the circulatory and respiratory systems: Secondary | ICD-10-CM | POA: Diagnosis not present

## 2018-03-29 LAB — CBC
HEMATOCRIT: 32.7 % — AB (ref 39.0–52.0)
HEMOGLOBIN: 9.7 g/dL — AB (ref 13.0–17.0)
MCH: 27.9 pg (ref 26.0–34.0)
MCHC: 29.7 g/dL — AB (ref 30.0–36.0)
MCV: 94 fL (ref 80.0–100.0)
Platelets: 248 10*3/uL (ref 150–400)
RBC: 3.48 MIL/uL — ABNORMAL LOW (ref 4.22–5.81)
RDW: 16.6 % — ABNORMAL HIGH (ref 11.5–15.5)
WBC: 11.3 10*3/uL — ABNORMAL HIGH (ref 4.0–10.5)
nRBC: 0 % (ref 0.0–0.2)

## 2018-03-29 MED ORDER — LISINOPRIL 5 MG PO TABS: 5.0000 mg | ORAL_TABLET | Freq: Every day | ORAL | 3 refills | Status: DC

## 2018-03-29 MED ORDER — DOXAZOSIN MESYLATE 1 MG PO TABS: 1.0000 mg | ORAL_TABLET | Freq: Every day | ORAL | 3 refills | Status: DC

## 2018-03-29 NOTE — Assessment & Plan Note (Addendum)
RCRI = 0 EKG and CXR today in h/o smoker and emphysema by CT.  I do want to lower blood pressures prior to upcoming procedure -- RTC 1 wk recheck bp.  Assuming stable evaluation and improved BP, should be reasonably cleared to proceed with urologic intervention for symptomatic BPH.

## 2018-03-29 NOTE — Patient Instructions (Signed)
Your procedure is scheduled ID:[id number]. 11/19 Report to Day Surgery. To find out your arrival time please call [phone] between 1PM - 3PM on Wed. 11/18.  Remember: Instructions that are not followed completely may result in serious medical risk,  up to and including death, or upon the discretion of your surgeon and anesthesiologist your  surgery may need to be rescheduled.     _X__ 1. Do not eat food after midnight the night before your procedure.                 No gum chewing or hard candies. You may drink clear liquids up to 2 hours                 before you are scheduled to arrive for your surgery- DO not drink clear                 liquids within 2 hours of the start of your surgery.                 Clear Liquids include:  water, apple juice without pulp, clear carbohydrate                 drink such as Clearfast of Gatorade, Black Coffee or Tea (Do not add                 anything to coffee or tea).  __X__2.  On the morning of surgery brush your teeth with toothpaste and water, you                may rinse your mouth with mouthwash if you wish.  Do not swallow any toothpaste of mouthwash.     _X__ 3.  No Alcohol for 24 hours before or after surgery.   ___ 4.  Do Not Smoke or use e-cigarettes For 24 Hours Prior to Your Surgery.                 Do not use any chewable tobacco products for at least 6 hours prior to                 surgery.  ____  5.  Bring all medications with you on the day of surgery if instructed.   _x___  6.  Notify your doctor if there is any change in your medical condition      (cold, fever, infections).     Do not wear jewelry, make-up, hairpins, clips or nail polish. Do not wear lotions, powders, or perfumes. You may wear deodorant. Do not shave 48 hours prior to surgery. Men may shave face and neck. Do not bring valuables to the hospital.    [facility] is not responsible for any belongings or valuables.  Contacts, dentures  or bridgework may not be worn into surgery. Leave your suitcase in the car. After surgery it may be brought to your room. For patients admitted to the hospital, discharge time is determined by your treatment team.   Patients discharged the day of surgery will not be allowed to drive home.   Please read over the following fact sheets that you were given:    _x___ Take these medicines the morning of surgery with A SIP OF WATER:    1. omeprazole (PRILOSEC) 40 MG capsule  Take extra dose the night before and one the morning of surgery  2. rosuvastatin (CRESTOR) 10 MG tablet  3. timolol (TIMOPTIC) 0.5 % ophthalmic solution  4.venlafaxine XR (  EFFEXOR-XR) 37.5 MG 24 hr capsule  5.  6.  ____ Fleet Enema (as directed)   ____ Use CHG Soap as directed  ____ Use inhalers on the day of surgery  ____ Stop metformin 2 days prior to surgery    ____ Take 1/2 of usual insulin dose the night before surgery. No insulin the morning          of surgery.   __x__ Stop aspirin on 11/12  ____ Stop Anti-inflammatories on    ____ Stop supplements until after surgery.    ____ Bring C-Pap to the hospital.

## 2018-03-29 NOTE — Assessment & Plan Note (Signed)
Off foley for several months. Ongoing incomplete emptying. Planned urolift.

## 2018-03-29 NOTE — Patient Instructions (Addendum)
Flu shot today. EKG today.  Chest xray today.  Schedule follow up visit with [facility] clinic rheumatology.  Start lisinopril 5mg  daily for blood pressure Return in 7-10 days for follow up BP visit.

## 2018-03-29 NOTE — Assessment & Plan Note (Addendum)
Lisinopril stopped 12/2017 due to hypotension/dizziness. Recent BP readings too high. Check orthostatics today - stable but remains hypertensive. Initially started considered cardura for BPH symptoms, however given prior concern for orthostasis, elected to start lisinopril 5mg  daily (prior on 10mg ). I did ask him to return in 7-10 days for labs and bp check prior to surgery. Pt agrees with plan.Thomas Lopez

## 2018-03-30 LAB — URINE CULTURE: Culture: NO GROWTH

## 2018-03-31 ENCOUNTER — Encounter: Payer: Self-pay | Admitting: Family Medicine

## 2018-03-31 DIAGNOSIS — R9431 Abnormal electrocardiogram [ECG] [EKG]: Secondary | ICD-10-CM | POA: Insufficient documentation

## 2018-03-31 NOTE — Assessment & Plan Note (Signed)
asxs from respiratory standpoint.  ?

## 2018-03-31 NOTE — Assessment & Plan Note (Signed)
Reviewed EKG from prior evaluation read as afib - poor quality, and there were p waves present. Will repeat EKG today.

## 2018-04-05 NOTE — Progress Notes (Signed)
BP 140/64 (BP Location: Left Arm, Patient Position: Sitting, Cuff Size: Normal)   Pulse 65   Temp 97.6 F (36.4 C) (Oral)   Ht 5\' 9"  (1.753 m)   Wt 145 lb (65.8 kg)   SpO2 97%   BMI 21.41 kg/m    CC: HTN recheck Subjective:    Patient ID: Thomas Lopez, male    DOB: 1932-09-27, 84 y.o.   MRN: [id number]  HPI: Thomas Lopez is a 84 y.o. male presenting on 04/06/2018 for BP Check (Here for 1 mo BP chk. )   See prior note for details.  Significant improvement since starting lisinopril 5mg  daily - will continue this.  No HA, vision changes, CP/tightness, SOB, leg swelling.  He does check B Pat home - doesn't remember readings but "a whole lot better" systolics <025 Upcoming urolift insertion 04/10/2018.   Reviewed rheum visit 12/2017 - mildly positive anti-CCP. Encouraged he call to f/u with Dr Meda Coffee at [facility].   Relevant past medical, surgical, family and social history reviewed and updated as indicated. Interim medical history since our last visit reviewed. Allergies and medications reviewed and updated. Outpatient Medications Prior to Visit  Medication Sig Dispense Refill  . aspirin EC 81 MG tablet Take 81 mg by mouth daily.    . Calcium Carbonate-Vitamin D (CALCIUM 600/VITAMIN D) 600-400 MG-UNIT chew tablet Chew 1 tablet by mouth daily.    . ferrous sulfate 325 (65 FE) MG tablet Take 1 tablet (325 mg total) by mouth daily with breakfast.  3  . leuprolide (LUPRON) 22.5 MG injection Inject 22.5 mg into the muscle every 3 (three) months.    . methylcellulose oral powder Take by mouth daily.    Marland Kitchen omeprazole (PRILOSEC) 40 MG capsule Take 1 capsule (40 mg total) by mouth daily. 90 capsule 3  . rosuvastatin (CRESTOR) 10 MG tablet Take 1 tablet (10 mg total) by mouth daily. 90 tablet 3  . tamsulosin (FLOMAX) 0.4 MG CAPS capsule Take 0.4 mg by mouth. After supper    . timolol (TIMOPTIC) 0.5 % ophthalmic solution Place 1 drop into the left eye 2 (two) times daily.     Marland Kitchen venlafaxine XR  (EFFEXOR-XR) 37.5 MG 24 hr capsule Take 1 capsule (37.5 mg total) by mouth daily with breakfast. 90 capsule 3  . lisinopril (PRINIVIL,ZESTRIL) 5 MG tablet Take 1 tablet (5 mg total) by mouth daily. 30 tablet 3   No facility-administered medications prior to visit.      Per HPI unless specifically indicated in ROS section below Review of Systems     Objective:    BP 140/64 (BP Location: Left Arm, Patient Position: Sitting, Cuff Size: Normal)   Pulse 65   Temp 97.6 F (36.4 C) (Oral)   Ht 5\' 9"  (1.753 m)   Wt 145 lb (65.8 kg)   SpO2 97%   BMI 21.41 kg/m   Wt Readings from Last 3 Encounters:  04/06/18 145 lb (65.8 kg)  03/29/18 148 lb 5 oz (67.3 kg)  03/29/18 148 lb 12 oz (67.5 kg)    Physical Exam  Constitutional: He appears well-developed and well-nourished. No distress.  HENT:  Mouth/Throat: Oropharynx is clear and moist. No oropharyngeal exudate.  Cardiovascular: Normal rate, regular rhythm and normal heart sounds.  No murmur heard. Pulmonary/Chest: Effort normal and breath sounds normal. No respiratory distress. He has no wheezes. He has no rales.  Musculoskeletal: He exhibits no edema.  Skin: Skin is warm and dry. No rash noted.  Psychiatric: He has a normal mood and affect.  Nursing note and vitals reviewed.  Results for orders placed or performed during the hospital encounter of 03/29/18  Urine culture  Result Value Ref Range   Specimen Description      URINE, CLEAN CATCH Performed at [facility], [address], [place], [place] [address]    Special Requests      NONE Performed at [facility], [address] [place], [place] [address]    Culture      NO GROWTH Performed at [facility], [place] [address], [place], [place] [address]    Report Status 03/30/2018 FINAL   CBC  Result Value Ref Range   WBC 11.3 (H) 4.0 - 10.5 K/uL   RBC 3.48 (L) 4.22 - 5.81 MIL/uL   Hemoglobin 9.7 (L) 13.0 - 17.0 g/dL   HCT 32.7 (L) 39.0 - 52.0 %    MCV 94.0 80.0 - 100.0 fL   MCH 27.9 26.0 - 34.0 pg   MCHC 29.7 (L) 30.0 - 36.0 g/dL   RDW 16.6 (H) 11.5 - 15.5 %   Platelets 248 150 - 400 K/uL   nRBC 0.0 0.0 - 0.2 %      Assessment & Plan:  I did ask him to call Dr [facility] rheumatology office to schedule f/u visit to review prior labs. Seems he was lost to follow up. Problem List Items Addressed This Visit    Pre-op exam    Reasonable to proceed with planned surgery without further evaluation.       Normocytic anemia   Essential hypertension - Primary    Significant improvement on low dose lisinopril - will continue      Relevant Medications   lisinopril (PRINIVIL,ZESTRIL) 5 MG tablet       Meds ordered this encounter  Medications  . DISCONTD: lisinopril (PRINIVIL,ZESTRIL) 5 MG tablet    Sig: Take 1 tablet (5 mg total) by mouth daily.    Dispense:  90 tablet    Refill:  2  . lisinopril (PRINIVIL,ZESTRIL) 5 MG tablet    Sig: Take 1 tablet (5 mg total) by mouth daily.    Dispense:  90 tablet    Refill:  2   No orders of the defined types were placed in this encounter.   Follow up plan: No follow-ups on file.  [name], MD

## 2018-04-06 ENCOUNTER — Ambulatory Visit (INDEPENDENT_AMBULATORY_CARE_PROVIDER_SITE_OTHER): Payer: Medicare PPO | Admitting: Family Medicine

## 2018-04-06 ENCOUNTER — Encounter: Payer: Self-pay | Admitting: Family Medicine

## 2018-04-06 VITALS — BP 140/64 | HR 65 | Temp 97.6°F | Ht 69.0 in | Wt 145.0 lb

## 2018-04-06 DIAGNOSIS — D649 Anemia, unspecified: Secondary | ICD-10-CM

## 2018-04-06 DIAGNOSIS — I10 Essential (primary) hypertension: Secondary | ICD-10-CM

## 2018-04-06 DIAGNOSIS — Z01818 Encounter for other preprocedural examination: Secondary | ICD-10-CM

## 2018-04-06 MED ORDER — LISINOPRIL 5 MG PO TABS: 5.0000 mg | ORAL_TABLET | Freq: Every day | ORAL | 2 refills | Status: DC

## 2018-04-06 NOTE — Assessment & Plan Note (Signed)
Significant improvement on low dose lisinopril - will continue

## 2018-04-06 NOTE — Patient Instructions (Addendum)
Blood pressures are much better - continue lisinopril 5mg  daily.  We will send to day's note to Dr Bernardo Heater Call Dr Francesca Oman office Felipa Evener) to schedule follow up visit

## 2018-04-06 NOTE — Assessment & Plan Note (Signed)
Reasonable to proceed with planned surgery without further evaluation.

## 2018-04-09 MED ORDER — CEFAZOLIN SODIUM-DEXTROSE 2-4 GM/100ML-% IV SOLN
2.0000 g | INTRAVENOUS | Status: AC
  Administered 2018-04-10: 2 g via INTRAVENOUS

## 2018-04-10 ENCOUNTER — Ambulatory Visit: Payer: Medicare PPO | Admitting: Anesthesiology

## 2018-04-10 ENCOUNTER — Encounter
Admission: RE | Disposition: A | Discharge status: Home / Self Care | Payer: Self-pay | Source: Ambulatory Visit | Attending: Urology

## 2018-04-10 ENCOUNTER — Encounter: Payer: Self-pay | Admitting: *Deleted

## 2018-04-10 ENCOUNTER — Telehealth: Payer: Self-pay | Admitting: Urology

## 2018-04-10 ENCOUNTER — Ambulatory Visit
Admission: RE | Admit: 2018-04-10 | Discharge: 2018-04-10 | Disposition: A | Discharge status: Home / Self Care | Payer: Medicare PPO | Source: Ambulatory Visit | Attending: Urology | Admitting: Urology

## 2018-04-10 DIAGNOSIS — Z8546 Personal history of malignant neoplasm of prostate: Secondary | ICD-10-CM | POA: Insufficient documentation

## 2018-04-10 DIAGNOSIS — E785 Hyperlipidemia, unspecified: Secondary | ICD-10-CM | POA: Diagnosis not present

## 2018-04-10 DIAGNOSIS — Z7982 Long term (current) use of aspirin: Secondary | ICD-10-CM | POA: Diagnosis not present

## 2018-04-10 DIAGNOSIS — Z79899 Other long term (current) drug therapy: Secondary | ICD-10-CM | POA: Insufficient documentation

## 2018-04-10 DIAGNOSIS — Z800 Family history of malignant neoplasm of digestive organs: Secondary | ICD-10-CM | POA: Diagnosis not present

## 2018-04-10 DIAGNOSIS — Z9842 Cataract extraction status, left eye: Secondary | ICD-10-CM | POA: Insufficient documentation

## 2018-04-10 DIAGNOSIS — Z9841 Cataract extraction status, right eye: Secondary | ICD-10-CM | POA: Diagnosis not present

## 2018-04-10 DIAGNOSIS — K219 Gastro-esophageal reflux disease without esophagitis: Secondary | ICD-10-CM | POA: Diagnosis not present

## 2018-04-10 DIAGNOSIS — I700 Atherosclerosis of aorta: Secondary | ICD-10-CM | POA: Insufficient documentation

## 2018-04-10 DIAGNOSIS — N401 Enlarged prostate with lower urinary tract symptoms: Secondary | ICD-10-CM | POA: Diagnosis not present

## 2018-04-10 DIAGNOSIS — H409 Unspecified glaucoma: Secondary | ICD-10-CM | POA: Insufficient documentation

## 2018-04-10 DIAGNOSIS — R3911 Hesitancy of micturition: Secondary | ICD-10-CM | POA: Insufficient documentation

## 2018-04-10 DIAGNOSIS — R3912 Poor urinary stream: Secondary | ICD-10-CM | POA: Insufficient documentation

## 2018-04-10 DIAGNOSIS — Z86010 Personal history of colonic polyps: Secondary | ICD-10-CM | POA: Insufficient documentation

## 2018-04-10 DIAGNOSIS — K589 Irritable bowel syndrome without diarrhea: Secondary | ICD-10-CM | POA: Insufficient documentation

## 2018-04-10 DIAGNOSIS — Z923 Personal history of irradiation: Secondary | ICD-10-CM | POA: Insufficient documentation

## 2018-04-10 DIAGNOSIS — R739 Hyperglycemia, unspecified: Secondary | ICD-10-CM | POA: Diagnosis not present

## 2018-04-10 DIAGNOSIS — Z8249 Family history of ischemic heart disease and other diseases of the circulatory system: Secondary | ICD-10-CM | POA: Diagnosis not present

## 2018-04-10 DIAGNOSIS — N138 Other obstructive and reflux uropathy: Secondary | ICD-10-CM

## 2018-04-10 DIAGNOSIS — R3914 Feeling of incomplete bladder emptying: Secondary | ICD-10-CM | POA: Diagnosis not present

## 2018-04-10 DIAGNOSIS — I10 Essential (primary) hypertension: Secondary | ICD-10-CM | POA: Diagnosis not present

## 2018-04-10 DIAGNOSIS — N1330 Unspecified hydronephrosis: Secondary | ICD-10-CM | POA: Diagnosis not present

## 2018-04-10 DIAGNOSIS — Z87891 Personal history of nicotine dependence: Secondary | ICD-10-CM | POA: Diagnosis not present

## 2018-04-10 HISTORY — PX: CYSTOSCOPY WITH INSERTION OF UROLIFT: SHX6678

## 2018-04-10 SURGERY — CYSTOSCOPY WITH INSERTION OF UROLIFT
Anesthesia: General

## 2018-04-10 MED ORDER — LACTATED RINGERS IV SOLN
INTRAVENOUS | Status: DC
  Administered 2018-04-10 (×2): via INTRAVENOUS

## 2018-04-10 MED ORDER — CEFAZOLIN SODIUM-DEXTROSE 2-4 GM/100ML-% IV SOLN
INTRAVENOUS | Status: AC
  Filled 2018-04-10: qty 100

## 2018-04-10 MED ORDER — EPHEDRINE SULFATE 50 MG/ML IJ SOLN
INTRAMUSCULAR | Status: AC
  Filled 2018-04-10: qty 1

## 2018-04-10 MED ORDER — ACETAMINOPHEN 10 MG/ML IV SOLN
INTRAVENOUS | Status: DC | PRN
  Administered 2018-04-10: 1000 mg via INTRAVENOUS

## 2018-04-10 MED ORDER — FENTANYL CITRATE (PF) 100 MCG/2ML IJ SOLN
INTRAMUSCULAR | Status: DC | PRN
  Administered 2018-04-10 (×3): 25 ug via INTRAVENOUS

## 2018-04-10 MED ORDER — PROPOFOL 10 MG/ML IV BOLUS
INTRAVENOUS | Status: DC | PRN
  Administered 2018-04-10: 150 mg via INTRAVENOUS
  Administered 2018-04-10: 20 mg via INTRAVENOUS

## 2018-04-10 MED ORDER — ONDANSETRON HCL 4 MG/2ML IJ SOLN
INTRAMUSCULAR | Status: DC | PRN
  Administered 2018-04-10: 4 mg via INTRAVENOUS

## 2018-04-10 MED ORDER — ONDANSETRON HCL 4 MG/2ML IJ SOLN
INTRAMUSCULAR | Status: AC
  Filled 2018-04-10: qty 2

## 2018-04-10 MED ORDER — FENTANYL CITRATE (PF) 100 MCG/2ML IJ SOLN
INTRAMUSCULAR | Status: AC
  Filled 2018-04-10: qty 2

## 2018-04-10 MED ORDER — PROPOFOL 10 MG/ML IV BOLUS
INTRAVENOUS | Status: AC
  Filled 2018-04-10: qty 20

## 2018-04-10 MED ORDER — PHENYLEPHRINE HCL 10 MG/ML IJ SOLN
INTRAMUSCULAR | Status: DC | PRN
  Administered 2018-04-10 (×2): 100 ug via INTRAVENOUS

## 2018-04-10 MED ORDER — OXYCODONE HCL 5 MG/5ML PO SOLN: 5.0000 mg | Freq: Once | ORAL | Status: DC | PRN

## 2018-04-10 MED ORDER — FENTANYL CITRATE (PF) 100 MCG/2ML IJ SOLN: 25.0000 ug | INTRAMUSCULAR | Status: DC | PRN

## 2018-04-10 MED ORDER — SUCCINYLCHOLINE CHLORIDE 20 MG/ML IJ SOLN
INTRAMUSCULAR | Status: AC
  Filled 2018-04-10: qty 1

## 2018-04-10 MED ORDER — OXYCODONE HCL 5 MG PO TABS: 5.0000 mg | ORAL_TABLET | Freq: Once | ORAL | Status: DC | PRN

## 2018-04-10 MED ORDER — EPHEDRINE SULFATE 50 MG/ML IJ SOLN
INTRAMUSCULAR | Status: DC | PRN
  Administered 2018-04-10: 10 mg via INTRAVENOUS

## 2018-04-10 MED ORDER — ACETAMINOPHEN 10 MG/ML IV SOLN
INTRAVENOUS | Status: AC
  Filled 2018-04-10: qty 100

## 2018-04-10 SURGICAL SUPPLY — 17 items
BAG DRAIN CYSTO-URO LG1000N (MISCELLANEOUS) ×3 IMPLANT
BAG URINE DRAINAGE (UROLOGICAL SUPPLIES) ×2 IMPLANT
CATH FOL 2WAY LX 16X30 (CATHETERS) ×2 IMPLANT
CATH FOL LEG HOLDER (MISCELLANEOUS) ×2 IMPLANT
COVER WAND RF STERILE (DRAPES) ×3 IMPLANT
GLOVE BIO SURGEON STRL SZ8 (GLOVE) ×3 IMPLANT
GOWN STRL REUS W/ TWL LRG LVL3 (GOWN DISPOSABLE) ×1 IMPLANT
GOWN STRL REUS W/ TWL XL LVL3 (GOWN DISPOSABLE) ×1 IMPLANT
GOWN STRL REUS W/TWL LRG LVL3 (GOWN DISPOSABLE) ×2
GOWN STRL REUS W/TWL XL LVL3 (GOWN DISPOSABLE) ×2
KIT TURNOVER CYSTO (KITS) ×3 IMPLANT
PACK CYSTO AR (MISCELLANEOUS) ×3 IMPLANT
SET CYSTO W/LG BORE CLAMP LF (SET/KITS/TRAYS/PACK) ×3 IMPLANT
SURGILUBE 2OZ TUBE FLIPTOP (MISCELLANEOUS) ×3 IMPLANT
SYSTEM UROLIFT (Male Continence) ×8 IMPLANT
WATER STERILE IRR 1000ML POUR (IV SOLUTION) ×3 IMPLANT
WATER STERILE IRR 3000ML UROMA (IV SOLUTION) ×3 IMPLANT

## 2018-04-10 NOTE — Transfer of Care (Signed)
Immediate Anesthesia Transfer of Care Note  Patient: Thomas Lopez  Procedure(s) Performed: CYSTOSCOPY WITH INSERTION OF UROLIFT (N/A )  Patient Location: PACU  Anesthesia Type:General  Level of Consciousness: awake  Airway & Oxygen Therapy: Patient Spontanous Breathing and Patient connected to face mask oxygen  Post-op Assessment: Report given to RN and Post -op Vital signs reviewed and stable  Post vital signs: Reviewed and stable  Last Vitals:  Vitals Value Taken Time  BP 112/46 04/10/2018  9:26 AM  Temp    Pulse 69 04/10/2018  9:26 AM  Resp 19 04/10/2018  9:26 AM  SpO2 100 % 04/10/2018  9:26 AM  Vitals shown include unvalidated device data.  Last Pain:  Vitals:   04/10/18 0713  TempSrc: Tympanic  PainSc: 0-No pain         Complications: No apparent anesthesia complications

## 2018-04-10 NOTE — Anesthesia Post-op Follow-up Note (Signed)
Anesthesia QCDR form completed.        

## 2018-04-10 NOTE — Anesthesia Procedure Notes (Signed)
Procedure Name: LMA Insertion Date/Time: 04/10/2018 8:47 AM Performed by: Allean Found, CRNA Pre-anesthesia Checklist: Patient identified, Patient being monitored, Timeout performed, Emergency Drugs available and Suction available Patient Re-evaluated:Patient Re-evaluated prior to induction Oxygen Delivery Method: Circle system utilized Preoxygenation: Pre-oxygenation with 100% oxygen Induction Type: IV induction Ventilation: Mask ventilation without difficulty LMA: LMA inserted LMA Size: 5.0 Tube type: Oral Number of attempts: 1 Placement Confirmation: positive ETCO2 and breath sounds checked- equal and bilateral Tube secured with: Tape Dental Injury: Teeth and Oropharynx as per pre-operative assessment

## 2018-04-10 NOTE — Telephone Encounter (Signed)
-----   Message from Abbie Sons, MD sent at 04/10/2018  9:35 AM EST ----- Please schedule catheter removal/voiding trial Friday morning

## 2018-04-10 NOTE — Interval H&P Note (Signed)
History and Physical Interval Note:  04/10/2018 8:24 AM  Thomas Lopez  has presented today for surgery, with the diagnosis of BPH with obstruction  The various methods of treatment have been discussed with the patient and family. After consideration of risks, benefits and other options for treatment, the patient has consented to  Procedure(s): CYSTOSCOPY WITH INSERTION OF UROLIFT (N/A) as a surgical intervention .  The patient's history has been reviewed, patient examined, no change in status, stable for surgery.  I have reviewed the patient's chart and labs.  Questions were answered to the patient's satisfaction.     [place]

## 2018-04-10 NOTE — Anesthesia Postprocedure Evaluation (Signed)
Anesthesia Post Note  Patient: Thomas Lopez  Procedure(s) Performed: CYSTOSCOPY WITH INSERTION OF UROLIFT (N/A )  Patient location during evaluation: PACU Anesthesia Type: General Level of consciousness: awake and alert Pain management: pain level controlled Vital Signs Assessment: post-procedure vital signs reviewed and stable Respiratory status: spontaneous breathing, nonlabored ventilation, respiratory function stable and patient connected to nasal cannula oxygen Cardiovascular status: blood pressure returned to baseline and stable Postop Assessment: no apparent nausea or vomiting Anesthetic complications: no     Last Vitals:  Vitals:   04/10/18 1007 04/10/18 1035  BP: (!) 157/55 (!) 138/52  Pulse: 69 (!) 57  Resp: 16 16  Temp: (!) 36.2 C   SpO2: 97% 98%    Last Pain:  Vitals:   04/10/18 1035  TempSrc:   PainSc: 0-No pain                 Precious Haws [place]iscit[place]

## 2018-04-10 NOTE — Anesthesia Preprocedure Evaluation (Signed)
Anesthesia Evaluation  Patient identified by MRN, date of birth, ID band Patient awake    Reviewed: Allergy & Precautions, H&P , NPO status , Patient's Chart, lab work & pertinent test results  History of Anesthesia Complications Negative for: history of anesthetic complications  Airway Mallampati: II  TM Distance: >3 FB Neck ROM: limited    Dental  (+) Poor Dentition, Edentulous Upper, Edentulous Lower, Missing   Pulmonary neg shortness of breath, COPD, former smoker,           Cardiovascular Exercise Tolerance: Good hypertension, + CAD       Neuro/Psych negative neurological ROS  negative psych ROS   GI/Hepatic Neg liver ROS, GERD  Medicated and Controlled,  Endo/Other  negative endocrine ROS  Renal/GU Renal disease     Musculoskeletal   Abdominal   Peds  Hematology negative hematology ROS (+)   Anesthesia Other Findings Past Medical History: 04/2015: Aorto-iliac atherosclerosis (HCC)     Comment:  by CT scan 11/2014: Carotid stenosis     Comment:  mild bilateral 1-39%, rpt 2 yrs No date: Colon polyps     Comment:  rpt colonoscopy due 2014 No date: ED (erectile dysfunction)     Comment:  Trimix and VED No date: Esophagitis 06/29/2009: Essential hypertension No date: GERD (gastroesophageal reflux disease) No date: Glaucoma     Comment:  severe ([place]) No date: HLD (hyperlipidemia) No date: HTN (hypertension) No date: Hyperglycemia No date: IBS (irritable bowel syndrome) 2005: Prostate cancer ([place])     Comment:  s/p seed implant, EBRT (Dr. Alinda Money at [place])               recurrent treating with androgen deprivation referred to               cancer center No date: Smoking history  Past Surgical History: 05/2017: CATARACT EXTRACTION; Left     Comment:  Dr. Edilia Bo December 2016: CATARACT EXTRACTION, BILATERAL     Comment:  [facility], Dr. Dellis Filbert [place] 03/01/01: COLONOSCOPY     Comment:   Multiple polyps, divertic//adenomatous polyps 04/24/01: COLONOSCOPY     Comment:  multiple polyps 10/23/01: COLONOSCOPY     Comment:  Polyps benign-repeat every 2 years 06/23/04: COLONOSCOPY     Comment:  Adenom/hyperplastic polyps; multiple external               hemorrhoids 06/08/07: COLONOSCOPY     Comment:  single small polyp-repeat 5 years 03/01/01: ESOPHAGOGASTRODUODENOSCOPY     Comment:  H.H.; gastritis esoph. dudodenitis 2/02: NCS/Wrists Left Carpal     Comment:  Min./right improved 10/2012: PFTs     Comment:  FVC 64%, FEV1 41%, ratio 0.62 12/04: PROSTATE BIOPSY     Comment:  positive; radioactive seed implant (Dr. Joelyn Oms) 1/27-07/23/03: Prostate Ext Beam Radiation     Comment:  Dr. Danny Lawless 09/2011: SPIROMETRY     Comment:  WNL No date: TONSILLECTOMY 6/98; 9/99: Wrist Release     Comment:  Right     Reproductive/Obstetrics negative OB ROS                             Anesthesia Physical Anesthesia Plan  ASA: III  Anesthesia Plan: General LMA   Post-op Pain Management:    Induction: Intravenous  PONV Risk Score and Plan: Ondansetron, Dexamethasone, Midazolam and Treatment may vary due to age or medical condition  Airway Management Planned: LMA  Additional Equipment:   Intra-op Plan:  Post-operative Plan: Extubation in OR  Informed Consent: I have reviewed the patients History and Physical, chart, labs and discussed the procedure including the risks, benefits and alternatives for the proposed anesthesia with the patient or authorized representative who has indicated his/her understanding and acceptance.   Dental Advisory Given  Plan Discussed with: Anesthesiologist, CRNA and Surgeon  Anesthesia Plan Comments: (Patient consented for risks of anesthesia including but not limited to:  - adverse reactions to medications - damage to teeth, lips or other oral mucosa - sore throat or hoarseness - Damage to heart, brain, lungs or loss of  life  Patient voiced understanding.)        Anesthesia Quick Evaluation

## 2018-04-10 NOTE — Discharge Instructions (Addendum)
AMBULATORY SURGERY  DISCHARGE INSTRUCTIONS   1) The drugs that you were given will stay in your system until tomorrow so for the next 24 hours you should not:  A) Drive an automobile B) Make any legal decisions C) Drink any alcoholic beverage   2) You may resume regular meals tomorrow.  Today it is better to start with liquids and gradually work up to solid foods.  You may eat anything you prefer, but it is better to start with liquids, then soup and crackers, and gradually work up to solid foods.   3) Please notify your doctor immediately if you have any unusual bleeding, trouble breathing, redness and pain at the surgery site, drainage, fever, or pain not relieved by medication.    4) Additional Instructions:        Please contact your physician with any problems or Same Day Surgery at [phone], Monday through Friday 6 am to 4 pm, or [facility] at [facility] number at [phone].   Indwelling Urinary Catheter Care, Adult Take good care of your catheter to keep it working and to prevent problems. How to wear your catheter Attach your catheter to your leg with tape (adhesive tape) or a leg strap. Make sure it is not too tight. If you use tape, remove any bits of tape that are already on the catheter. How to wear a drainage bag You should have:  A large overnight bag.  A small leg bag.  Overnight Bag You may wear the overnight bag at any time. Always keep the bag below the level of your bladder but off the floor. When you sleep, put a clean plastic bag in a wastebasket. Then hang the bag inside the wastebasket. Leg Bag Never wear the leg bag at night. Always wear the leg bag below your knee. Keep the leg bag secure with a leg strap or tape. How to care for your skin  Clean the skin around the catheter at least once every day.  Shower every day. Do not take baths.  Put creams, lotions, or ointments on your genital area only as told by your doctor.  Do not  use powders, sprays, or lotions on your genital area. How to clean your catheter and your skin 1. Wash your hands with soap and water. 2. Wet a washcloth in warm water and gentle (mild) soap. 3. Use the washcloth to clean the skin where the catheter enters your body. Clean downward and wipe away from the catheter in small circles. Do not wipe toward the catheter. 4. Pat the area dry with a clean towel. Make sure to clean off all soap. How to care for your drainage bags Empty your drainage bag when it is ?- full or at least 2-3 times a day. Replace your drainage bag once a month or sooner if it starts to smell bad or look dirty. Do not clean your drainage bag unless told by your doctor. Emptying a drainage bag  Supplies Needed  Rubbing alcohol.  Gauze pad or cotton ball.  Tape or a leg strap.  Steps 1. Wash your hands with soap and water. 2. Separate (detach) the bag from your leg. 3. Hold the bag over the toilet or a clean container. Keep the bag below your hips and bladder. This stops pee (urine) from going back into the tube. 4. Open the pour spout at the bottom of the bag. 5. Empty the pee into the toilet or container. Do not let the pour spout touch  any surface. 6. Put rubbing alcohol on a gauze pad or cotton ball. 7. Use the gauze pad or cotton ball to clean the pour spout. 8. Close the pour spout. 9. Attach the bag to your leg with tape or a leg strap. 10. Wash your hands.  Changing a drainage bag Supplies Needed  Alcohol wipes.  A clean drainage bag.  Adhesive tape or a leg strap.  Steps 1. Wash your hands with soap and water. 2. Separate the dirty bag from your leg. 3. Pinch the rubber catheter with your fingers so that pee does not spill out. 4. Separate the catheter tube from the drainage tube where these tubes connect (at the connection valve). Do not let the tubes touch any surface. 5. Clean the end of the catheter tube with an alcohol wipe. Use a different  alcohol wipe to clean the end of the drainage tube. 6. Connect the catheter tube to the drainage tube of the clean bag. 7. Attach the new bag to the leg with adhesive tape or a leg strap. 8. Wash your hands.  How to prevent infection and other problems  Never pull on your catheter or try to remove it. Pulling can damage tissue in your body.  Always wash your hands before and after touching your catheter.  If a leg strap gets wet, replace it with a dry one.  Drink enough fluids to keep your pee clear or pale yellow, or as told by your doctor.  Do not let the drainage bag or tubing touch the floor.  Wear cotton underwear.  If you are male, wipe from front to back after you poop (have a bowel movement).  Check on the catheter often to make sure it works and the tubing is not twisted. Get help if:  Your pee is cloudy.  Your pee smells unusually bad.  Your pee is not draining into the bag.  Your tube gets clogged.  Your catheter starts to leak.  Your bladder feels full. Get help right away if:  You have redness, swelling, or pain where the catheter enters your body.  You have fluid, pus, or a bad smell coming from the area where the catheter enters your body.  The area where the catheter enters your body feels warm.  You have a fever.  You have pain in your: ? Stomach (abdomen). ? Legs. ? Lower back. ? Bladder.  You see blood fill the catheter.  Your pee is pink or red.  You feel sick to your stomach (nauseous).  You throw up (vomit).  You have chills.  Your catheter gets pulled out. This information is not intended to replace advice given to you by your health care provider. Make sure you discuss any questions you have with your health care provider. Document Released: 09/03/2012 Document Revised: 04/06/2016 Document Reviewed: 10/22/2013 Elsevier Interactive Patient Education  [employer].

## 2018-04-10 NOTE — Telephone Encounter (Signed)
Ap made   [facility]

## 2018-04-10 NOTE — Op Note (Signed)
Preoperative diagnosis: BPH with incomplete bladder emptying  Postoperative diagnosis: BPH with incomplete bladder emptying  Principal procedure: Urolift procedure, with the placement of 4 implants.  Surgeon: Bernardo Heater  Anesthesia: General/LMA  Complications: None  Drains: 28 French Foley catheter, to leg bag.  Estimated blood loss: < 5 mL  Indications: 84 year-old male with incomplete bladder emptying and bilateral hydronephrosis.  After a trial of Foley catheter drainage his hydronephrosis did not change.  Lasix renal scan showed no evidence of obstruction.  He does complain of urinary hesitancy and decreased force and caliber of his urinary stream.  Video urodynamic study was consistent with outlet obstruction.  Management options including TURP with resection/ablation of the prostate as well as Urolift were discussed.  The patient has chosen to have a Urolift procedure.  He has been instructed to the procedure as well as risks and complications which include but are not limited to infection, bleeding, and inadequate treatment with the Urolift procedure alone, anesthetic complications, among others.  He understands these and desires to proceed.  Findings: Using the 17 French cystoscope, urethra and bladder were inspected.  There were no urethral lesions.  Prostatic urethra showed lateral lobe enlargement with occlusion visually.  The bladder was inspected circumferentially.  This revealed trabeculation with cellule formation.  No mucosal erythema, solid or papillary lesions were identified.  Description of procedure: The patient was properly identified in the holding area.  He received preoperative IV antibiotics.  He was taken to the operating room where general anesthetic was administered with the LMA.  He is placed in the dorsolithotomy position.  Genitalia and perineum were prepped and draped.  Proper timeout was performed.  A 6F cystoscope was inserted into the bladder. The cystoscopy  bridge was replaced with a UroLift delivery device.The first treatment site was the patient's right side approximately 1.5cm distal to the bladder neck. The distal tip of the delivery device was then angled laterally approximately 20 degrees at this position to compress the lateral lobe.  The trigger was pulled, thereby deploying a needle containing the implant through the prostate.  The needle was then retracted, allowing one end of the implant to be delivered to the capsular surface of the prostate.  The implant was then tensioned to assure capsular seating and removal of slack monofilament.  The device was then angled back toward midline and slowly advanced proximally until cystoscopic verification of the monofilament being centered in the delivery bay. The urethral end piece was then affixed to the monofilament thereby tailoring the size of the implant.  Excess filament was then severed.  The delivery device was then re-advanced into the bladder. The delivery device was then replaced with cystoscope and bridge and the implant location and opening effect was confirmed cystoscopically.  The same procedure was then repeated on the left side, and 2 additional implants were delivered just proximal to the verumontanum, again one on right and one on left side of the prostate, following the same technique.    A final cystoscopy was conducted first to inspect the location and state of each implant and second, to confirm the presence of a continuous anterior channel was present through the prostatic urethra with irrigation flow turned off. Two implants were delivered in total.   The cystoscope was removed.  Upon introduction cystoscope his bladder.  Full and it was elected to place a 16 [place] Foley catheter for a period of bladder rest.  After anesthetic reversal he was taken to the PACU in stable  condition.  He tolerated the procedure well.   Keshan Reha [employer]. MD

## 2018-04-11 ENCOUNTER — Encounter: Payer: Self-pay | Admitting: Urology

## 2018-04-13 ENCOUNTER — Encounter: Payer: Self-pay | Admitting: Urology

## 2018-04-13 ENCOUNTER — Ambulatory Visit (INDEPENDENT_AMBULATORY_CARE_PROVIDER_SITE_OTHER): Payer: Medicare PPO | Admitting: Urology

## 2018-04-13 VITALS — BP 127/66 | HR 91 | Wt 144.0 lb

## 2018-04-13 DIAGNOSIS — N401 Enlarged prostate with lower urinary tract symptoms: Secondary | ICD-10-CM

## 2018-04-13 DIAGNOSIS — N138 Other obstructive and reflux uropathy: Secondary | ICD-10-CM | POA: Diagnosis not present

## 2018-04-13 NOTE — Progress Notes (Signed)
Fill and Pull Catheter Removal  Patient is present today for a catheter removal.  Patient was cleaned and prepped in a sterile fashion 126ml of sterile water/ saline was instilled into the bladder when the patient felt the urge to urinate. 76ml of water was then drained from the balloon.  A 16FR foley cath was removed from the bladder complications were noted as: patient had bladder spasm and leakage before foley was removed .  Patient as then given some time to void on their own.  Patient can void  23ml on their own after some time.  Patient tolerated well.  Preformed by: Fonnie Jarvis, CMA  Follow up/ Additional notes: patient to return this afternoon for a PVR    Bladder Scan Patient is leaking and not able to control void: 0 ml Performed By: Fonnie Jarvis, CMA

## 2018-04-16 ENCOUNTER — Inpatient Hospital Stay: Payer: Medicare PPO

## 2018-04-16 ENCOUNTER — Encounter: Payer: Self-pay | Admitting: Internal Medicine

## 2018-04-16 ENCOUNTER — Other Ambulatory Visit: Payer: Self-pay

## 2018-04-16 ENCOUNTER — Ambulatory Visit (INDEPENDENT_AMBULATORY_CARE_PROVIDER_SITE_OTHER): Payer: Medicare PPO

## 2018-04-16 ENCOUNTER — Inpatient Hospital Stay: Payer: Medicare PPO | Attending: Internal Medicine | Admitting: Internal Medicine

## 2018-04-16 VITALS — BP 127/61 | HR 62 | Temp 97.6°F | Resp 18 | Ht 69.0 in | Wt 142.0 lb

## 2018-04-16 VITALS — BP 159/70 | HR 60 | Resp 18

## 2018-04-16 DIAGNOSIS — D649 Anemia, unspecified: Secondary | ICD-10-CM

## 2018-04-16 DIAGNOSIS — E785 Hyperlipidemia, unspecified: Secondary | ICD-10-CM | POA: Insufficient documentation

## 2018-04-16 DIAGNOSIS — K219 Gastro-esophageal reflux disease without esophagitis: Secondary | ICD-10-CM | POA: Insufficient documentation

## 2018-04-16 DIAGNOSIS — Z7982 Long term (current) use of aspirin: Secondary | ICD-10-CM

## 2018-04-16 DIAGNOSIS — H409 Unspecified glaucoma: Secondary | ICD-10-CM | POA: Diagnosis not present

## 2018-04-16 DIAGNOSIS — N3945 Continuous leakage: Secondary | ICD-10-CM

## 2018-04-16 DIAGNOSIS — R5383 Other fatigue: Secondary | ICD-10-CM | POA: Insufficient documentation

## 2018-04-16 DIAGNOSIS — Z79818 Long term (current) use of other agents affecting estrogen receptors and estrogen levels: Secondary | ICD-10-CM | POA: Insufficient documentation

## 2018-04-16 DIAGNOSIS — R5381 Other malaise: Secondary | ICD-10-CM | POA: Insufficient documentation

## 2018-04-16 DIAGNOSIS — C61 Malignant neoplasm of prostate: Secondary | ICD-10-CM | POA: Diagnosis not present

## 2018-04-16 DIAGNOSIS — N320 Bladder-neck obstruction: Secondary | ICD-10-CM | POA: Insufficient documentation

## 2018-04-16 DIAGNOSIS — R109 Unspecified abdominal pain: Secondary | ICD-10-CM

## 2018-04-16 DIAGNOSIS — Z87891 Personal history of nicotine dependence: Secondary | ICD-10-CM | POA: Diagnosis not present

## 2018-04-16 DIAGNOSIS — I129 Hypertensive chronic kidney disease with stage 1 through stage 4 chronic kidney disease, or unspecified chronic kidney disease: Secondary | ICD-10-CM | POA: Diagnosis not present

## 2018-04-16 DIAGNOSIS — Z79899 Other long term (current) drug therapy: Secondary | ICD-10-CM | POA: Diagnosis not present

## 2018-04-16 DIAGNOSIS — N183 Chronic kidney disease, stage 3 (moderate): Secondary | ICD-10-CM | POA: Diagnosis not present

## 2018-04-16 DIAGNOSIS — N1330 Unspecified hydronephrosis: Secondary | ICD-10-CM

## 2018-04-16 LAB — CBC WITH DIFFERENTIAL/PLATELET
Abs Immature Granulocytes: 0.03 10*3/uL (ref 0.00–0.07)
Basophils Absolute: 0 10*3/uL (ref 0.0–0.1)
Basophils Relative: 1 %
EOS ABS: 0.1 10*3/uL (ref 0.0–0.5)
EOS PCT: 2 %
HEMATOCRIT: 31.3 % — AB (ref 39.0–52.0)
Hemoglobin: 9.3 g/dL — ABNORMAL LOW (ref 13.0–17.0)
Immature Granulocytes: 1 %
Lymphocytes Relative: 16 %
Lymphs Abs: 1 10*3/uL (ref 0.7–4.0)
MCH: 26.8 pg (ref 26.0–34.0)
MCHC: 29.7 g/dL — AB (ref 30.0–36.0)
MCV: 90.2 fL (ref 80.0–100.0)
Monocytes Absolute: 0.4 10*3/uL (ref 0.1–1.0)
Monocytes Relative: 6 %
NRBC: 0 % (ref 0.0–0.2)
Neutro Abs: 4.7 10*3/uL (ref 1.7–7.7)
Neutrophils Relative %: 74 %
Platelets: 306 10*3/uL (ref 150–400)
RBC: 3.47 MIL/uL — ABNORMAL LOW (ref 4.22–5.81)
RDW: 14.6 % (ref 11.5–15.5)
WBC: 6.2 10*3/uL (ref 4.0–10.5)

## 2018-04-16 LAB — BASIC METABOLIC PANEL
Anion gap: 7 (ref 5–15)
BUN: 37 mg/dL — ABNORMAL HIGH (ref 8–23)
CALCIUM: 9.4 mg/dL (ref 8.9–10.3)
CO2: 27 mmol/L (ref 22–32)
CREATININE: 1.43 mg/dL — AB (ref 0.61–1.24)
Chloride: 108 mmol/L (ref 98–111)
GFR calc non Af Amer: 44 mL/min — ABNORMAL LOW (ref >60)
GFR, EST AFRICAN AMERICAN: 51 mL/min — AB (ref >60)
Glucose, Bld: 122 mg/dL — ABNORMAL HIGH (ref 70–99)
Potassium: 4.4 mmol/L (ref 3.5–5.1)
SODIUM: 142 mmol/L (ref 135–145)

## 2018-04-16 LAB — IRON AND TIBC
Iron: 30 ug/dL — ABNORMAL LOW (ref 45–182)
SATURATION RATIOS: 10 % — AB (ref 17.9–39.5)
TIBC: 289 ug/dL (ref 250–450)
UIBC: 259 ug/dL

## 2018-04-16 LAB — SAMPLE TO BLOOD BANK

## 2018-04-16 LAB — FERRITIN: Ferritin: 56 ng/mL (ref 24–336)

## 2018-04-16 MED ORDER — IRON SUCROSE 20 MG/ML IV SOLN
200.0000 mg | Freq: Once | INTRAVENOUS | Status: AC
  Administered 2018-04-16: 200 mg via INTRAVENOUS
  Filled 2018-04-16: qty 10

## 2018-04-16 MED ORDER — LEUPROLIDE ACETATE (3 MONTH) 22.5 MG IM KIT
22.5000 mg | PACK | Freq: Once | INTRAMUSCULAR | Status: AC
  Administered 2018-04-16: 22.5 mg via INTRAMUSCULAR
  Filled 2018-04-16: qty 22.5

## 2018-04-16 MED ORDER — SODIUM CHLORIDE 0.9 % IV SOLN
Freq: Once | INTRAVENOUS | Status: AC
  Administered 2018-04-16: 09:00:00 via INTRAVENOUS
  Filled 2018-04-16: qty 250

## 2018-04-16 MED ORDER — SODIUM CHLORIDE 0.9 % IV SOLN: 200.0000 mg | Freq: Once | INTRAVENOUS | Status: DC

## 2018-04-16 NOTE — Assessment & Plan Note (Addendum)
#  Normocytic anemia-unclear etiology; status post bone marrow biopsy/NGS normal.?-related to chronic disease/? CKD  Today hemoglobin- 9.3/ STABLE.  If continues get worse recommend Aranesp.  #Bilateral hydro-nephrosis /hydroureter - bladder neck obstruction; followed by urology.  Awaiting repeat evaluation today.  #CKD stage III creatinine 1.4.  Stable. See above.   # Metastatic prostate cancer Hormone sensitive- biochemical recurrence [M0]-  [on Lupron- today; 04/16/18]; STABLE; PSA- 2.36; monitor for now;   # DISPOSITION: # Lupron/IV venofer today.  # follow up in 6 weeks cbc/bmp/hold tube/MD/possible IV venofer; Dr.B

## 2018-04-16 NOTE — Progress Notes (Signed)
[facility] OFFICE PROGRESS NOTE  Patient Care Team: [name], MD as PCP - General (Family Medicine) Bond, Tracie Harrier, MD as Consulting Physician (Ophthalmology) Abbie Sons, MD as Consulting Physician (Urology)  Cancer Staging No matching staging information was found for the patient.   Oncology History   # external beam radiation therapy and radiation seed implant in 2005 with Dr. Joelyn Oms and Dr. Danny Lawless for T1c Gleason 3+4 = 7 prostate cancer with a pretreatment PSA of 6.8. In November 2015, he was found to have biochemical recurrence.  Dec 2016- CT a/p & Bone scan- NEG; PSA- 11; June 2017- 19; STARTED on Firmagon [Dr.Budzyn]; on Lupron q 60M  # OCT 2018- Anemia-? Etiology; Likely anemia of chronic disease [anti-CCP elevated/ but NO RA; rheumatology; ]BMBx- hypercellular 30%; relative myeloid hyperplasia/erythroid hypoplasia; FISH- 5/7/8-Normal. II OPINION at [facility]. [facility] HEM- NEG  # CKD- III; Bladder neck obstruction s/p Foley [Dr.Stoiff]  # JAN 2018- Dexa scan- N ------------------------------------   DIAGNOSIS: PROSTATE CANCER  STAGE:  IV       ;GOALS: control  CURRENT/MOST RECENT THERAPY: Lupron      Primary adenocarcinoma of prostate ([place])      INTERVAL HISTORY:  Thomas Lopez 84 y.o.  male pleasant patient above history of anemia of unclear etiology is here for follow-up.  Patient continues to complain of ongoing fatigue.  Otherwise denies any blood in stools or black or stools.  Patient is currently being followed by urology closely for his difficulty with urination; he had his Foley catheter taken out.  Complains of urinary dribbling.  Review of Systems  Constitutional: Positive for malaise/fatigue and weight loss. Negative for chills, diaphoresis and fever.  HENT: Negative for nosebleeds and sore throat.   Eyes: Negative for double vision.  Respiratory: Positive for shortness of breath. Negative for cough, hemoptysis,  sputum production and wheezing.   Cardiovascular: Negative for chest pain, palpitations, orthopnea and leg swelling.  Gastrointestinal: Negative for abdominal pain, blood in stool, constipation, diarrhea, heartburn, melena, nausea and vomiting.  Genitourinary: Negative for dysuria, frequency and urgency.  Musculoskeletal: Negative for back pain and joint pain.  Skin: Negative.  Negative for itching and rash.  Neurological: Negative for dizziness, tingling, focal weakness, weakness and headaches.  Endo/Heme/Allergies: Does not bruise/bleed easily.  Psychiatric/Behavioral: Negative for depression. The patient is not nervous/anxious and does not have insomnia.       PAST MEDICAL HISTORY :  Past Medical History:  Diagnosis Date  . Aorto-iliac atherosclerosis ([place]) 04/2015   by CT scan  . Carotid stenosis 11/2014   mild bilateral 1-39%, rpt 2 yrs  . Colon polyps    rpt colonoscopy due 2014  . ED (erectile dysfunction)    Trimix and VED  . Esophagitis   . Essential hypertension 06/29/2009  . GERD (gastroesophageal reflux disease)   . Glaucoma    severe ([place])  . HLD (hyperlipidemia)   . HTN (hypertension)   . Hyperglycemia   . IBS (irritable bowel syndrome)   . Prostate cancer [facility]) 2005   s/p seed implant, EBRT ([facility] Alinda Money at [facility]) recurrent treating with androgen deprivation referred to cancer center  . Smoking history     PAST SURGICAL HISTORY :   Past Surgical History:  Procedure Laterality Date  . CATARACT EXTRACTION Left 05/2017   Dr. Edilia Bo  . CATARACT EXTRACTION, BILATERAL  December 2016   [place], Dr. Raynelle Fanning  . COLONOSCOPY  03/01/01   Multiple polyps, divertic//adenomatous  polyps  . COLONOSCOPY  04/24/01   multiple polyps  . COLONOSCOPY  10/23/01   Polyps benign-repeat every 2 years  . COLONOSCOPY  06/23/04   Adenom/hyperplastic polyps; multiple external hemorrhoids  . COLONOSCOPY  06/08/07   single small polyp-repeat 5 years  . CYSTOSCOPY WITH  INSERTION OF UROLIFT N/A 04/10/2018   Procedure: CYSTOSCOPY WITH INSERTION OF UROLIFT;  Surgeon: Abbie Sons, MD;  Location: [facility] ORS;  Service: Urology;  Laterality: N/A;  . ESOPHAGOGASTRODUODENOSCOPY  03/01/01   H.H.; gastritis esoph. dudodenitis  . NCS/Wrists Left Carpal  2/02   Min./right improved  . PFTs  10/2012   FVC 64%, FEV1 41%, ratio 0.62  . PROSTATE BIOPSY  12/04   positive; radioactive seed implant (Dr. Joelyn Oms)  . Prostate Ext Beam Radiation  1/27-07/23/03   Dr. Danny Lawless  . SPIROMETRY  09/2011   WNL  . TONSILLECTOMY    . Wrist Release  6/98; 9/99   Right    FAMILY HISTORY :   Family History  Problem Relation Age of Onset  . Cancer Mother        colon  . Hyperlipidemia Mother   . Other Mother        Carotid disease  . Coronary artery disease Neg Hx   . Stroke Neg Hx     SOCIAL HISTORY:   Social History   Tobacco Use  . Smoking status: Former Smoker    Last attempt to quit: 05/23/1993    Years since quitting: 24.9  . Smokeless tobacco: Never Used  Substance Use Topics  . Alcohol use: Yes    Comment: Rare  . Drug use: No    ALLERGIES:  has No Known Allergies.  MEDICATIONS:  Current Outpatient Medications  Medication Sig Dispense Refill  . aspirin EC 81 MG tablet Take 81 mg by mouth daily.    . Calcium Carbonate-Vitamin D (CALCIUM 600/VITAMIN D) 600-400 MG-UNIT chew tablet Chew 1 tablet by mouth daily.    . ferrous sulfate 325 (65 FE) MG tablet Take 1 tablet (325 mg total) by mouth daily with breakfast.  3  . leuprolide (LUPRON) 22.5 MG injection Inject 22.5 mg into the muscle every 3 (three) months.    Marland Kitchen lisinopril (PRINIVIL,ZESTRIL) 5 MG tablet Take 1 tablet (5 mg total) by mouth daily. 90 tablet 2  . methylcellulose oral powder Take by mouth daily.    Marland Kitchen omeprazole (PRILOSEC) 40 MG capsule Take 1 capsule (40 mg total) by mouth daily. 90 capsule 3  . rosuvastatin (CRESTOR) 10 MG tablet Take 1 tablet (10 mg total) by mouth daily. 90 tablet 3  .  tamsulosin (FLOMAX) 0.4 MG CAPS capsule Take 0.4 mg by mouth. After supper    . timolol (TIMOPTIC) 0.5 % ophthalmic solution Place 1 drop into the left eye 2 (two) times daily.     Marland Kitchen venlafaxine XR (EFFEXOR-XR) 37.5 MG 24 hr capsule Take 1 capsule (37.5 mg total) by mouth daily with breakfast. 90 capsule 3   No current facility-administered medications for this visit.     PHYSICAL EXAMINATION: ECOG PERFORMANCE STATUS: 1 - Symptomatic but completely ambulatory  BP 127/61 (BP Location: Right Arm, Patient Position: Sitting)   Pulse 62   Temp 97.6 F (36.4 C) (Tympanic)   Resp 18   Ht 5' 9"  (1.753 m)   Wt 142 lb (64.4 kg)   BMI 20.97 kg/m   Filed Weights   04/16/18 0828  Weight: 142 lb (64.4 kg)    Physical Exam  Constitutional: He is oriented to person, place, and time.  Thin built cachectic appearing male patient .  He is alone.  Walking by himself.  Appears pale.  HENT:  Head: Normocephalic and atraumatic.  Mouth/Throat: Oropharynx is clear and moist. No oropharyngeal exudate.  Eyes: Pupils are equal, round, and reactive to light.  Neck: Normal range of motion. Neck supple.  Cardiovascular: Normal rate and regular rhythm.  Pulmonary/Chest: No respiratory distress. He has no wheezes.  Abdominal: Soft. Bowel sounds are normal. He exhibits no distension and no mass. There is no tenderness. There is no rebound and no guarding.  Musculoskeletal: Normal range of motion. He exhibits no edema or tenderness.  Neurological: He is alert and oriented to person, place, and time.  Skin: Skin is warm.  Psychiatric: Affect normal.   LABORATORY DATA:  I have reviewed the data as listed    Component Value Date/Time   NA 142 04/16/2018 0815   K 4.4 04/16/2018 0815   CL 108 04/16/2018 0815   CO2 27 04/16/2018 0815   GLUCOSE 122 (H) 04/16/2018 0815   BUN 37 (H) 04/16/2018 0815   CREATININE 1.43 (H) 04/16/2018 0815   CALCIUM 9.4 04/16/2018 0815   PROT 6.9 03/14/2018 1011   ALBUMIN 3.4  (L) 03/14/2018 1011   AST 17 03/14/2018 1011   ALT 11 03/14/2018 1011   ALKPHOS 50 03/14/2018 1011   BILITOT 0.3 03/14/2018 1011   GFRNONAA 44 (L) 04/16/2018 0815   GFRAA 51 (L) 04/16/2018 0815    No results found for: SPEP, UPEP  Lab Results  Component Value Date   WBC 6.2 04/16/2018   NEUTROABS 4.7 04/16/2018   HGB 9.3 (L) 04/16/2018   HCT 31.3 (L) 04/16/2018   MCV 90.2 04/16/2018   PLT 306 04/16/2018      Chemistry      Component Value Date/Time   NA 142 04/16/2018 0815   K 4.4 04/16/2018 0815   CL 108 04/16/2018 0815   CO2 27 04/16/2018 0815   BUN 37 (H) 04/16/2018 0815   CREATININE 1.43 (H) 04/16/2018 0815      Component Value Date/Time   CALCIUM 9.4 04/16/2018 0815   ALKPHOS 50 03/14/2018 1011   AST 17 03/14/2018 1011   ALT 11 03/14/2018 1011   BILITOT 0.3 03/14/2018 1011       RADIOGRAPHIC STUDIES: I have personally reviewed the radiological images as listed and agreed with the findings in the report. No results found.   ASSESSMENT & PLAN:  Normocytic anemia #Normocytic anemia-unclear etiology; status post bone marrow biopsy/NGS normal.?-related to chronic disease/? CKD  Today hemoglobin- 9.3/ STABLE.  If continues get worse recommend Aranesp.  #Bilateral hydro-nephrosis /hydroureter - bladder neck obstruction; followed by urology.  Awaiting repeat evaluation today.  #CKD stage III creatinine 1.4.  Stable. See above.   # Metastatic prostate cancer Hormone sensitive- biochemical recurrence [M0]-  [on Lupron- today; 04/16/18]; STABLE; PSA- 2.36; monitor for now;   # DISPOSITION: # Lupron/IV venofer today.  # follow up in 6 weeks cbc/bmp/hold tube/MD/possible IV venofer; Dr.B   Orders Placed This Encounter  Procedures  . CBC with Differential/Platelet    Standing Status:   Future    Standing Expiration Date:   04/17/2019  . Comprehensive metabolic panel    Standing Status:   Future    Standing Expiration Date:   04/17/2019  . Sample to Blood  Bank    Standing Status:   Future    Standing Expiration Date:  04/17/2019   All questions were answered. The patient knows to call the clinic with any problems, questions or concerns.      Cammie Sickle, MD 04/17/2018 10:44 AM

## 2018-04-16 NOTE — Progress Notes (Signed)
Patient presents to clinic for possible blood transfusion and lupron. Patient states that his energy level has improved; however, he does have bladder urgency.

## 2018-04-16 NOTE — Progress Notes (Signed)
Patient present today complaining of constant leakage with no urge or control. A PVR was preformed to ensure patient was not in overflow Bladder Scan Patient can void: 20 ml Performed By: Fonnie Jarvis, CMA Per Dr. Diamantina Providence patient was instructed to keep follow up next month with Dr. Bernardo Heater and leakage should get better with time. Patient was noted to have a Right inguinal Hernia, Dr. Diamantina Providence was asked to examine and hernia was decompressed. No discomfort expressed by patient

## 2018-05-09 ENCOUNTER — Ambulatory Visit (INDEPENDENT_AMBULATORY_CARE_PROVIDER_SITE_OTHER): Payer: Medicare PPO | Admitting: Urology

## 2018-05-09 ENCOUNTER — Encounter: Payer: Self-pay | Admitting: Urology

## 2018-05-09 VITALS — BP 117/61 | HR 65 | Wt 147.0 lb

## 2018-05-09 DIAGNOSIS — R3914 Feeling of incomplete bladder emptying: Secondary | ICD-10-CM | POA: Diagnosis not present

## 2018-05-09 DIAGNOSIS — N3945 Continuous leakage: Secondary | ICD-10-CM | POA: Diagnosis not present

## 2018-05-09 DIAGNOSIS — N401 Enlarged prostate with lower urinary tract symptoms: Secondary | ICD-10-CM | POA: Diagnosis not present

## 2018-05-09 DIAGNOSIS — R399 Unspecified symptoms and signs involving the genitourinary system: Secondary | ICD-10-CM | POA: Diagnosis not present

## 2018-05-09 LAB — URINALYSIS, COMPLETE
BILIRUBIN UA: NEGATIVE
Glucose, UA: NEGATIVE
KETONES UA: NEGATIVE
Nitrite, UA: POSITIVE — AB
SPEC GRAV UA: 1.02 (ref 1.005–1.030)
Urobilinogen, Ur: 0.2 mg/dL (ref 0.2–1.0)
pH, UA: 5.5 (ref 5.0–7.5)

## 2018-05-09 LAB — MICROSCOPIC EXAMINATION
RBC, UA: NONE SEEN /hpf (ref 0–2)
WBC, UA: >30 /hpf — ABNORMAL HIGH (ref 0–5)

## 2018-05-09 LAB — BLADDER SCAN AMB NON-IMAGING

## 2018-05-09 NOTE — Addendum Note (Signed)
Addended by: Donalee Citrin on: 05/09/2018 01:34 PM   Modules accepted: Orders

## 2018-05-09 NOTE — Progress Notes (Signed)
05/09/2018 1:26 PM   Thomas Lopez [birth year] [id number]  Referring provider: [name], MD [address] [place], [place] [address]  Chief Complaint  Patient presents with  . Post-op Follow-up    HPI: 84 year old male presents for postop follow-up.  He had elevated residual of 300-400 mL and complains of urinary hesitancy and decreased urinary stream.  Urodynamic study was consistent with outlet obstruction.  He status post UroLift placement on 04/10/2018.  He was seen approximately 1 week postop complaining of urinary leakage.  PVR was 20 mL.  He states his leakage has significantly improved.  He is voiding with a good stream and feels he is emptying his bladder.  On occasion he does note unaware incontinence but it has significantly improved.  He denies dysuria or gross hematuria.  PVR by bladder scan today was 80 mL.   PMH: Past Medical History:  Diagnosis Date  . Aorto-iliac atherosclerosis ([place]) 04/2015   by CT scan  . Carotid stenosis 11/2014   mild bilateral 1-39%, rpt 2 yrs  . Colon polyps    rpt colonoscopy due 2014  . ED (erectile dysfunction)    Trimix and VED  . Esophagitis   . Essential hypertension 06/29/2009  . GERD (gastroesophageal reflux disease)   . Glaucoma    severe ([place])  . HLD (hyperlipidemia)   . HTN (hypertension)   . Hyperglycemia   . IBS (irritable bowel syndrome)   . Prostate cancer [facility]) 2005   s/p seed implant, EBRT (Dr. Alinda Money at [facility]) recurrent treating with androgen deprivation referred to cancer center  . Smoking history     Surgical History: Past Surgical History:  Procedure Laterality Date  . CATARACT EXTRACTION Left 05/2017   Dr. Edilia Bo  . CATARACT EXTRACTION, BILATERAL  December 2016   [place], Dr. Raynelle Fanning  . COLONOSCOPY  03/01/01   Multiple polyps, divertic//adenomatous polyps  . COLONOSCOPY  04/24/01   multiple polyps  . COLONOSCOPY  10/23/01   Polyps benign-repeat every 2 years  .  COLONOSCOPY  06/23/04   Adenom/hyperplastic polyps; multiple external hemorrhoids  . COLONOSCOPY  06/08/07   single small polyp-repeat 5 years  . CYSTOSCOPY WITH INSERTION OF UROLIFT N/A 04/10/2018   Procedure: CYSTOSCOPY WITH INSERTION OF UROLIFT;  Surgeon: Abbie Sons, MD;  Location: [facility] ORS;  Service: Urology;  Laterality: N/A;  . ESOPHAGOGASTRODUODENOSCOPY  03/01/01   H.H.; gastritis esoph. dudodenitis  . NCS/Wrists Left Carpal  2/02   Min./right improved  . PFTs  10/2012   FVC 64%, FEV1 41%, ratio 0.62  . PROSTATE BIOPSY  12/04   positive; radioactive seed implant (Dr. Joelyn Oms)  . Prostate Ext Beam Radiation  1/27-07/23/03   Dr. Danny Lawless  . SPIROMETRY  09/2011   WNL  . TONSILLECTOMY    . Wrist Release  6/98; 9/99   Right    Home Medications:  Allergies as of 05/09/2018   No Known Allergies     Medication List       Accurate as of May 09, 2018  1:26 PM. Always use your most recent med list.        aspirin EC 81 MG tablet Take 81 mg by mouth daily.   Calcium Carbonate-Vitamin D 600-400 MG-UNIT chew tablet Commonly known as:  CALCIUM 600/VITAMIN D Chew 1 tablet by mouth daily.   ferrous sulfate 325 (65 FE) MG tablet Take 1 tablet (325 mg total) by mouth daily with breakfast.   leuprolide 22.5 MG injection  Commonly known as:  LUPRON Inject 22.5 mg into the muscle every 3 (three) months.   lisinopril 5 MG tablet Commonly known as:  PRINIVIL,ZESTRIL Take 1 tablet (5 mg total) by mouth daily.   methylcellulose oral powder Take by mouth daily.   omeprazole 40 MG capsule Commonly known as:  PRILOSEC Take 1 capsule (40 mg total) by mouth daily.   rosuvastatin 10 MG tablet Commonly known as:  CRESTOR Take 1 tablet (10 mg total) by mouth daily.   tamsulosin 0.4 MG Caps capsule Commonly known as:  FLOMAX Take 0.4 mg by mouth. After supper   timolol 0.5 % ophthalmic solution Commonly known as:  TIMOPTIC Place 1 drop into the left eye 2 (two) times  daily.   venlafaxine XR 37.5 MG 24 hr capsule Commonly known as:  EFFEXOR-XR Take 1 capsule (37.5 mg total) by mouth daily with breakfast.       Allergies: No Known Allergies  Family History: Family History  Problem Relation Age of Onset  . Cancer Mother        colon  . Hyperlipidemia Mother   . Other Mother        Carotid disease  . Coronary artery disease Neg Hx   . Stroke Neg Hx     Social History:  reports that he quit smoking about 24 years ago. He has never used smokeless tobacco. He reports current alcohol use. He reports that he does not use drugs.  ROS: UROLOGY Frequent Urination?: No Hard to postpone urination?: No Burning/pain with urination?: No Get up at night to urinate?: Yes Leakage of urine?: Yes Urine stream starts and stops?: No Trouble starting stream?: No Do you have to strain to urinate?: No Blood in urine?: No Urinary tract infection?: No Sexually transmitted disease?: No Injury to kidneys or bladder?: No Painful intercourse?: No Weak stream?: No Erection problems?: No Penile pain?: No  Gastrointestinal Nausea?: No Vomiting?: No Indigestion/heartburn?: No Diarrhea?: No Constipation?: No  Constitutional Fever: No Night sweats?: No Weight loss?: No Fatigue?: No  Skin Skin rash/lesions?: No Itching?: No  Eyes Blurred vision?: No Double vision?: No  Ears/Nose/Throat Sore throat?: No Sinus problems?: No  Hematologic/Lymphatic Swollen glands?: No Easy bruising?: No  Cardiovascular Leg swelling?: No Chest pain?: No  Respiratory Cough?: No Shortness of breath?: No  Endocrine Excessive thirst?: No  Musculoskeletal Back pain?: No Joint pain?: No  Neurological Headaches?: No Dizziness?: No  Psychologic Depression?: No Anxiety?: No  Physical Exam: BP 117/61   Pulse 65   Wt 147 lb (66.7 kg)   BMI 21.71 kg/m   Constitutional:  Alert and oriented, No acute distress. HEENT: [place] AT, moist mucus membranes.   Trachea midline, no masses. Cardiovascular: No clubbing, cyanosis, or edema. Respiratory: Normal respiratory effort, no increased work of breathing. GI: Abdomen is soft, nontender, nondistended, no abdominal masses GU: No CVA tenderness Lymph: No cervical or inguinal lymphadenopathy. Skin: No rashes, bruises or suspicious lesions. Neurologic: Grossly intact, no focal deficits, moving all 4 extremities. Psychiatric: Normal mood and affect.   Assessment & Plan:   Doing well status post UroLift placement.  Residuals have significantly improved.  Follow-up 6 months with PVR and he was instructed to call earlier for any urinary problems.   Abbie Sons, [place] [address] [place], [place] [address] [phone]

## 2018-05-10 ENCOUNTER — Ambulatory Visit: Payer: Medicare PPO | Admitting: Urology

## 2018-05-13 ENCOUNTER — Other Ambulatory Visit: Payer: Self-pay | Admitting: Urology

## 2018-05-13 MED ORDER — SULFAMETHOXAZOLE-TRIMETHOPRIM 800-160 MG PO TABS: 1.0000 | ORAL_TABLET | Freq: Two times a day (BID) | ORAL | 0 refills | Status: DC

## 2018-05-14 ENCOUNTER — Telehealth: Payer: Self-pay | Admitting: Urology

## 2018-05-14 NOTE — Telephone Encounter (Signed)
Patient returned call.  Information was provided to him about his positive urine culture and prescription at the pharmacy.  Patient expressed understanding.

## 2018-05-14 NOTE — Telephone Encounter (Signed)
Left vm to call back   Stoioff, Ronda Fairly, MD  [facility]        Urine culture was positive for infection. Antibiotic Rx was sent to pharmacy

## 2018-05-16 LAB — CULTURE, URINE COMPREHENSIVE

## 2018-05-28 ENCOUNTER — Other Ambulatory Visit: Payer: Self-pay

## 2018-05-28 ENCOUNTER — Inpatient Hospital Stay (HOSPITAL_BASED_OUTPATIENT_CLINIC_OR_DEPARTMENT_OTHER): Payer: Medicare PPO | Admitting: Internal Medicine

## 2018-05-28 ENCOUNTER — Inpatient Hospital Stay: Payer: Medicare PPO | Attending: Internal Medicine

## 2018-05-28 ENCOUNTER — Inpatient Hospital Stay: Payer: Medicare PPO

## 2018-05-28 VITALS — BP 150/65 | HR 50 | Temp 95.0°F | Resp 18

## 2018-05-28 VITALS — BP 143/70 | HR 58 | Temp 97.8°F | Resp 20 | Ht 69.0 in | Wt 148.8 lb

## 2018-05-28 DIAGNOSIS — C61 Malignant neoplasm of prostate: Secondary | ICD-10-CM | POA: Insufficient documentation

## 2018-05-28 DIAGNOSIS — R5381 Other malaise: Secondary | ICD-10-CM | POA: Insufficient documentation

## 2018-05-28 DIAGNOSIS — H409 Unspecified glaucoma: Secondary | ICD-10-CM | POA: Insufficient documentation

## 2018-05-28 DIAGNOSIS — Z79818 Long term (current) use of other agents affecting estrogen receptors and estrogen levels: Secondary | ICD-10-CM

## 2018-05-28 DIAGNOSIS — Z79899 Other long term (current) drug therapy: Secondary | ICD-10-CM | POA: Diagnosis not present

## 2018-05-28 DIAGNOSIS — R5383 Other fatigue: Secondary | ICD-10-CM | POA: Insufficient documentation

## 2018-05-28 DIAGNOSIS — Z7982 Long term (current) use of aspirin: Secondary | ICD-10-CM | POA: Insufficient documentation

## 2018-05-28 DIAGNOSIS — E785 Hyperlipidemia, unspecified: Secondary | ICD-10-CM | POA: Insufficient documentation

## 2018-05-28 DIAGNOSIS — N183 Chronic kidney disease, stage 3 (moderate): Secondary | ICD-10-CM

## 2018-05-28 DIAGNOSIS — E875 Hyperkalemia: Secondary | ICD-10-CM

## 2018-05-28 DIAGNOSIS — D649 Anemia, unspecified: Secondary | ICD-10-CM

## 2018-05-28 DIAGNOSIS — K589 Irritable bowel syndrome without diarrhea: Secondary | ICD-10-CM

## 2018-05-28 DIAGNOSIS — N320 Bladder-neck obstruction: Secondary | ICD-10-CM

## 2018-05-28 DIAGNOSIS — R339 Retention of urine, unspecified: Secondary | ICD-10-CM

## 2018-05-28 DIAGNOSIS — I129 Hypertensive chronic kidney disease with stage 1 through stage 4 chronic kidney disease, or unspecified chronic kidney disease: Secondary | ICD-10-CM

## 2018-05-28 DIAGNOSIS — D509 Iron deficiency anemia, unspecified: Secondary | ICD-10-CM

## 2018-05-28 DIAGNOSIS — Z923 Personal history of irradiation: Secondary | ICD-10-CM | POA: Diagnosis not present

## 2018-05-28 DIAGNOSIS — K219 Gastro-esophageal reflux disease without esophagitis: Secondary | ICD-10-CM

## 2018-05-28 LAB — COMPREHENSIVE METABOLIC PANEL
ALBUMIN: 4 g/dL (ref 3.5–5.0)
ALT: 12 U/L (ref 0–44)
AST: 18 U/L (ref 15–41)
Alkaline Phosphatase: 56 U/L (ref 38–126)
Anion gap: 7 (ref 5–15)
BUN: 51 mg/dL — ABNORMAL HIGH (ref 8–23)
CO2: 23 mmol/L (ref 22–32)
CREATININE: 1.76 mg/dL — AB (ref 0.61–1.24)
Calcium: 9.4 mg/dL (ref 8.9–10.3)
Chloride: 110 mmol/L (ref 98–111)
GFR calc Af Amer: 41 mL/min — ABNORMAL LOW (ref >60)
GFR calc non Af Amer: 35 mL/min — ABNORMAL LOW (ref >60)
GLUCOSE: 98 mg/dL (ref 70–99)
Potassium: 5.4 mmol/L — ABNORMAL HIGH (ref 3.5–5.1)
Sodium: 140 mmol/L (ref 135–145)
Total Bilirubin: 0.4 mg/dL (ref 0.3–1.2)
Total Protein: 7.4 g/dL (ref 6.5–8.1)

## 2018-05-28 LAB — CBC WITH DIFFERENTIAL/PLATELET
Abs Immature Granulocytes: 0.02 10*3/uL (ref 0.00–0.07)
Basophils Absolute: 0 10*3/uL (ref 0.0–0.1)
Basophils Relative: 0 %
EOS PCT: 1 %
Eosinophils Absolute: 0.1 10*3/uL (ref 0.0–0.5)
HCT: 29.9 % — ABNORMAL LOW (ref 39.0–52.0)
Hemoglobin: 8.9 g/dL — ABNORMAL LOW (ref 13.0–17.0)
Immature Granulocytes: 0 %
Lymphocytes Relative: 18 %
Lymphs Abs: 1.2 10*3/uL (ref 0.7–4.0)
MCH: 27 pg (ref 26.0–34.0)
MCHC: 29.8 g/dL — ABNORMAL LOW (ref 30.0–36.0)
MCV: 90.6 fL (ref 80.0–100.0)
Monocytes Absolute: 0.4 10*3/uL (ref 0.1–1.0)
Monocytes Relative: 7 %
Neutro Abs: 4.9 10*3/uL (ref 1.7–7.7)
Neutrophils Relative %: 74 %
Platelets: 265 10*3/uL (ref 150–400)
RBC: 3.3 MIL/uL — ABNORMAL LOW (ref 4.22–5.81)
RDW: 16.4 % — ABNORMAL HIGH (ref 11.5–15.5)
WBC: 6.7 10*3/uL (ref 4.0–10.5)
nRBC: 0 % (ref 0.0–0.2)

## 2018-05-28 LAB — SAMPLE TO BLOOD BANK

## 2018-05-28 MED ORDER — SODIUM CHLORIDE 0.9 % IV SOLN
Freq: Once | INTRAVENOUS | Status: AC
  Administered 2018-05-28: 14:00:00 via INTRAVENOUS
  Filled 2018-05-28: qty 250

## 2018-05-28 MED ORDER — IRON SUCROSE 20 MG/ML IV SOLN
200.0000 mg | Freq: Once | INTRAVENOUS | Status: AC
  Administered 2018-05-28: 200 mg via INTRAVENOUS
  Filled 2018-05-28: qty 10

## 2018-05-28 NOTE — Progress Notes (Signed)
[place] OFFICE PROGRESS NOTE  Patient Care Team: [name], MD as PCP - General (Family Medicine) Bond, Tracie Harrier, MD as Consulting Physician (Ophthalmology) Abbie Sons, MD as Consulting Physician (Urology)  Cancer Staging No matching staging information was found for the patient.   Oncology History   # external beam radiation therapy and radiation seed implant in 2005 with Dr. Joelyn Oms and Dr. Danny Lawless for T1c Gleason 3+4 = 7 prostate cancer with a pretreatment PSA of 6.8. In November 2015, he was found to have biochemical recurrence.  Dec 2016- CT a/p & Bone scan- NEG; PSA- 11; June 2017- 19; STARTED on Firmagon [Dr.Budzyn]; on Lupron q 22M  # OCT 2018- Anemia-? Etiology; Likely anemia of chronic disease [anti-CCP elevated/ but NO RA; rheumatology; ]BMBx- hypercellular 30%; relative myeloid hyperplasia/erythroid hypoplasia; FISH- 5/7/8-Normal. II OPINION at [facility]- NEG  # CKD- III; Bladder neck obstruction s/p Foley [Dr.Stoiff]  # JAN 2018- Dexa scan- N ------------------------------------   DIAGNOSIS: PROSTATE CANCER  STAGE:  IV       ;GOALS: control  CURRENT/MOST RECENT THERAPY: Lupron      Primary adenocarcinoma of prostate ([place])      INTERVAL HISTORY:  Thomas Lopez 85 y.o.  male pleasant patient above history of anemia of unclear etiology is here for follow-up.  In the interim patient underwent a uro-lift procedure for his bladder retention.  He denies any leaking of the urine at this time.  Patient states his appetite is fair.  Denies any blood in stools or black or stools.  Complains of mild to moderate fatigue.  Review of Systems  Constitutional: Positive for malaise/fatigue. Negative for chills, diaphoresis and fever.  HENT: Negative for nosebleeds and sore throat.   Eyes: Negative for double vision.  Respiratory: Positive for shortness of breath. Negative for cough, hemoptysis, sputum production and  wheezing.   Cardiovascular: Negative for chest pain, palpitations, orthopnea and leg swelling.  Gastrointestinal: Negative for abdominal pain, blood in stool, constipation, diarrhea, heartburn, melena, nausea and vomiting.  Genitourinary: Negative for dysuria, frequency and urgency.  Musculoskeletal: Negative for back pain and joint pain.  Skin: Negative.  Negative for itching and rash.  Neurological: Negative for dizziness, tingling, focal weakness, weakness and headaches.  Endo/Heme/Allergies: Does not bruise/bleed easily.  Psychiatric/Behavioral: Negative for depression. The patient is not nervous/anxious and does not have insomnia.       PAST MEDICAL HISTORY :  Past Medical History:  Diagnosis Date  . Aorto-iliac atherosclerosis ([place]) 04/2015   by CT scan  . Carotid stenosis 11/2014   mild bilateral 1-39%, rpt 2 yrs  . Colon polyps    rpt colonoscopy due 2014  . ED (erectile dysfunction)    Trimix and VED  . Esophagitis   . Essential hypertension 06/29/2009  . GERD (gastroesophageal reflux disease)   . Glaucoma    severe ([place])  . HLD (hyperlipidemia)   . HTN (hypertension)   . Hyperglycemia   . IBS (irritable bowel syndrome)   . Prostate cancer [facility]) 2005   s/p seed implant, EBRT (Dr. Alinda Money at [facility]) recurrent treating with androgen deprivation referred to cancer center  . Smoking history     PAST SURGICAL HISTORY :   Past Surgical History:  Procedure Laterality Date  . CATARACT EXTRACTION Left 05/2017   Dr. Edilia Bo  . CATARACT EXTRACTION, BILATERAL  December 2016   [place], Dr. Raynelle Fanning  . COLONOSCOPY  03/01/01   Multiple polyps, divertic//adenomatous  polyps  . COLONOSCOPY  04/24/01   multiple polyps  . COLONOSCOPY  10/23/01   Polyps benign-repeat every 2 years  . COLONOSCOPY  06/23/04   Adenom/hyperplastic polyps; multiple external hemorrhoids  . COLONOSCOPY  06/08/07   single small polyp-repeat 5 years  . CYSTOSCOPY WITH INSERTION OF UROLIFT  N/A 04/10/2018   Procedure: CYSTOSCOPY WITH INSERTION OF UROLIFT;  Surgeon: Abbie Sons, MD;  Location: [facility] ORS;  Service: Urology;  Laterality: N/A;  . ESOPHAGOGASTRODUODENOSCOPY  03/01/01   H.H.; gastritis esoph. dudodenitis  . NCS/Wrists Left Carpal  2/02   Min./right improved  . PFTs  10/2012   FVC 64%, FEV1 41%, ratio 0.62  . PROSTATE BIOPSY  12/04   positive; radioactive seed implant (Dr. Joelyn Oms)  . Prostate Ext Beam Radiation  1/27-07/23/03   Dr. Danny Lawless  . SPIROMETRY  09/2011   WNL  . TONSILLECTOMY    . Wrist Release  6/98; 9/99   Right    FAMILY HISTORY :   Family History  Problem Relation Age of Onset  . Cancer Mother        colon  . Hyperlipidemia Mother   . Other Mother        Carotid disease  . Coronary artery disease Neg Hx   . Stroke Neg Hx     SOCIAL HISTORY:   Social History   Tobacco Use  . Smoking status: Former Smoker    Last attempt to quit: 05/23/1993    Years since quitting: 25.0  . Smokeless tobacco: Never Used  Substance Use Topics  . Alcohol use: Yes    Comment: Rare  . Drug use: No    ALLERGIES:  has No Known Allergies.  MEDICATIONS:  Current Outpatient Medications  Medication Sig Dispense Refill  . aspirin EC 81 MG tablet Take 81 mg by mouth daily.    . Calcium Carbonate-Vitamin D (CALCIUM 600/VITAMIN D) 600-400 MG-UNIT chew tablet Chew 1 tablet by mouth daily.    . ferrous sulfate 325 (65 FE) MG tablet Take 1 tablet (325 mg total) by mouth daily with breakfast.  3  . leuprolide (LUPRON) 22.5 MG injection Inject 22.5 mg into the muscle every 3 (three) months.    Marland Kitchen lisinopril (PRINIVIL,ZESTRIL) 5 MG tablet Take 1 tablet (5 mg total) by mouth daily. 90 tablet 2  . methylcellulose oral powder Take by mouth daily.    Marland Kitchen omeprazole (PRILOSEC) 40 MG capsule Take 1 capsule (40 mg total) by mouth daily. 90 capsule 3  . rosuvastatin (CRESTOR) 10 MG tablet Take 1 tablet (10 mg total) by mouth daily. 90 tablet 3  . tamsulosin (FLOMAX) 0.4 MG  CAPS capsule Take 0.4 mg by mouth. After supper    . timolol (TIMOPTIC) 0.5 % ophthalmic solution Place 1 drop into the left eye 2 (two) times daily.     Marland Kitchen venlafaxine XR (EFFEXOR-XR) 37.5 MG 24 hr capsule Take 1 capsule (37.5 mg total) by mouth daily with breakfast. 90 capsule 3   No current facility-administered medications for this visit.    Facility-Administered Medications Ordered in Other Visits  Medication Dose Route Frequency Provider Last Rate Last Dose  . 0.9 %  sodium chloride infusion   Intravenous Once Charlaine Dalton R, MD      . iron sucrose (VENOFER) injection 200 mg  200 mg Intravenous Once Cammie Sickle, MD        PHYSICAL EXAMINATION: ECOG PERFORMANCE STATUS: 1 - Symptomatic but completely ambulatory  BP (!) 143/70 (BP Location:  Left Arm, Patient Position: Sitting)   Pulse (!) 58   Temp 97.8 F (36.6 C) (Oral)   Resp 20   Ht 5' 9" (1.753 m)   Wt 148 lb 12.8 oz (67.5 kg)   BMI 21.97 kg/m   Filed Weights   05/28/18 1317  Weight: 148 lb 12.8 oz (67.5 kg)    Physical Exam  Constitutional: He is oriented to person, place, and time.  Thin built cachectic appearing male patient .  He is alone.  Walking by himself.  Appears pale.  HENT:  Head: Normocephalic and atraumatic.  Mouth/Throat: Oropharynx is clear and moist. No oropharyngeal exudate.  Eyes: Pupils are equal, round, and reactive to light.  Neck: Normal range of motion. Neck supple.  Cardiovascular: Normal rate and regular rhythm.  Pulmonary/Chest: No respiratory distress. He has no wheezes.  Abdominal: Soft. Bowel sounds are normal. He exhibits no distension and no mass. There is no abdominal tenderness. There is no rebound and no guarding.  Musculoskeletal: Normal range of motion.        General: No tenderness or edema.  Neurological: He is alert and oriented to person, place, and time.  Skin: Skin is warm.  Psychiatric: Affect normal.   LABORATORY DATA:  I have reviewed the data as  listed    Component Value Date/Time   NA 140 05/28/2018 1251   K 5.4 (H) 05/28/2018 1251   CL 110 05/28/2018 1251   CO2 23 05/28/2018 1251   GLUCOSE 98 05/28/2018 1251   BUN 51 (H) 05/28/2018 1251   CREATININE 1.76 (H) 05/28/2018 1251   CALCIUM 9.4 05/28/2018 1251   PROT 7.4 05/28/2018 1251   ALBUMIN 4.0 05/28/2018 1251   AST 18 05/28/2018 1251   ALT 12 05/28/2018 1251   ALKPHOS 56 05/28/2018 1251   BILITOT 0.4 05/28/2018 1251   GFRNONAA 35 (L) 05/28/2018 1251   GFRAA 41 (L) 05/28/2018 1251    No results found for: SPEP, UPEP  Lab Results  Component Value Date   WBC 6.7 05/28/2018   NEUTROABS 4.9 05/28/2018   HGB 8.9 (L) 05/28/2018   HCT 29.9 (L) 05/28/2018   MCV 90.6 05/28/2018   PLT 265 05/28/2018      Chemistry      Component Value Date/Time   NA 140 05/28/2018 1251   K 5.4 (H) 05/28/2018 1251   CL 110 05/28/2018 1251   CO2 23 05/28/2018 1251   BUN 51 (H) 05/28/2018 1251   CREATININE 1.76 (H) 05/28/2018 1251      Component Value Date/Time   CALCIUM 9.4 05/28/2018 1251   ALKPHOS 56 05/28/2018 1251   AST 18 05/28/2018 1251   ALT 12 05/28/2018 1251   BILITOT 0.4 05/28/2018 1251       RADIOGRAPHIC STUDIES: I have personally reviewed the radiological images as listed and agreed with the findings in the report. No results found.   ASSESSMENT & PLAN:  Normocytic anemia #Normocytic anemia-unclear etiology; status post bone marrow biopsy/NGS normal.?-related to chronic disease/? CKD  Today hemoglobin- 8.4/ STABLE.  If continues get worse recommend Aranesp.  Proceed with Venofer q 2 weeks x2.   #Bilateral hydro-nephrosis /hydroureter - bladder neck obstruction; followed by urology; s/p Urolift procedure.  Stable.  #CKD stage III creatinine 1.4-1.7.  Stable. See above.; if worse would recommend nephorlogy evaluation.    #Hyperkalemia potassium 5.4.  Recommend holding of lisinopril 5 mg.  Will recheck labs in 2 weeks.  # Metastatic prostate cancer Hormone  sensitive- biochemical recurrence [  M0]-  [on Lupron- 04/16/18]; STABLE; PSA- 2.36; monitor for now;   # DISPOSITION: # IV venofer today; no blood.  # Iv venofer in 2 week/labs- cbc-hold tube/ bmp  # follow up in 8 weeks cbc/bmp/hold tube/MD/possible IV venofer; Dr.B   No orders of the defined types were placed in this encounter.  All questions were answered. The patient knows to call the clinic with any problems, questions or concerns.      Cammie Sickle, MD 05/28/2018 2:09 PM

## 2018-05-28 NOTE — Patient Instructions (Signed)
#  Recommend holding of lisinopril [as the potassium is running high]; we will repeat the potassium levels in 2 weeks.

## 2018-05-28 NOTE — Assessment & Plan Note (Addendum)
#  Normocytic anemia-unclear etiology; status post bone marrow biopsy/NGS normal.?-related to chronic disease/? CKD  Today hemoglobin- 8.4/ STABLE.  If continues get worse recommend Aranesp.  Proceed with Venofer q 2 weeks x2.   #Bilateral hydro-nephrosis /hydroureter - bladder neck obstruction; followed by urology; s/p Urolift procedure.  Stable.  #CKD stage III creatinine 1.4-1.7.  Stable. See above.; if worse would recommend nephorlogy evaluation.    #Hyperkalemia potassium 5.4.  Recommend holding of lisinopril 5 mg.  Will recheck labs in 2 weeks.  # Metastatic prostate cancer Hormone sensitive- biochemical recurrence [M0]-  [on Lupron- 04/16/18]; STABLE; PSA- 2.36; monitor for now;   # DISPOSITION: # IV venofer today; no blood.  # Iv venofer in 2 week/labs- cbc-hold tube/ bmp  # follow up in 8 weeks cbc/bmp/hold tube/MD/possible IV venofer; Dr.B

## 2018-06-06 DIAGNOSIS — H401133 Primary open-angle glaucoma, bilateral, severe stage: Secondary | ICD-10-CM | POA: Diagnosis not present

## 2018-06-08 ENCOUNTER — Other Ambulatory Visit: Payer: Self-pay | Admitting: *Deleted

## 2018-06-08 DIAGNOSIS — C61 Malignant neoplasm of prostate: Secondary | ICD-10-CM

## 2018-06-08 DIAGNOSIS — D649 Anemia, unspecified: Secondary | ICD-10-CM

## 2018-06-11 ENCOUNTER — Inpatient Hospital Stay: Payer: Medicare PPO

## 2018-06-25 DIAGNOSIS — H401133 Primary open-angle glaucoma, bilateral, severe stage: Secondary | ICD-10-CM | POA: Diagnosis not present

## 2018-07-23 ENCOUNTER — Encounter: Payer: Self-pay | Admitting: Internal Medicine

## 2018-07-23 ENCOUNTER — Inpatient Hospital Stay (HOSPITAL_BASED_OUTPATIENT_CLINIC_OR_DEPARTMENT_OTHER): Payer: Medicare PPO | Admitting: Internal Medicine

## 2018-07-23 ENCOUNTER — Inpatient Hospital Stay: Payer: Medicare PPO | Attending: Internal Medicine

## 2018-07-23 ENCOUNTER — Inpatient Hospital Stay: Payer: Medicare PPO

## 2018-07-23 VITALS — BP 134/58 | HR 52 | Resp 18

## 2018-07-23 DIAGNOSIS — E785 Hyperlipidemia, unspecified: Secondary | ICD-10-CM

## 2018-07-23 DIAGNOSIS — K219 Gastro-esophageal reflux disease without esophagitis: Secondary | ICD-10-CM

## 2018-07-23 DIAGNOSIS — C61 Malignant neoplasm of prostate: Secondary | ICD-10-CM

## 2018-07-23 DIAGNOSIS — H409 Unspecified glaucoma: Secondary | ICD-10-CM

## 2018-07-23 DIAGNOSIS — Z7982 Long term (current) use of aspirin: Secondary | ICD-10-CM | POA: Insufficient documentation

## 2018-07-23 DIAGNOSIS — E875 Hyperkalemia: Secondary | ICD-10-CM | POA: Diagnosis not present

## 2018-07-23 DIAGNOSIS — R5381 Other malaise: Secondary | ICD-10-CM | POA: Insufficient documentation

## 2018-07-23 DIAGNOSIS — N183 Chronic kidney disease, stage 3 (moderate): Secondary | ICD-10-CM

## 2018-07-23 DIAGNOSIS — Z79818 Long term (current) use of other agents affecting estrogen receptors and estrogen levels: Secondary | ICD-10-CM | POA: Insufficient documentation

## 2018-07-23 DIAGNOSIS — N320 Bladder-neck obstruction: Secondary | ICD-10-CM

## 2018-07-23 DIAGNOSIS — I129 Hypertensive chronic kidney disease with stage 1 through stage 4 chronic kidney disease, or unspecified chronic kidney disease: Secondary | ICD-10-CM

## 2018-07-23 DIAGNOSIS — Z923 Personal history of irradiation: Secondary | ICD-10-CM

## 2018-07-23 DIAGNOSIS — D649 Anemia, unspecified: Secondary | ICD-10-CM | POA: Diagnosis not present

## 2018-07-23 DIAGNOSIS — Z79899 Other long term (current) drug therapy: Secondary | ICD-10-CM

## 2018-07-23 DIAGNOSIS — R5383 Other fatigue: Secondary | ICD-10-CM

## 2018-07-23 DIAGNOSIS — Z87891 Personal history of nicotine dependence: Secondary | ICD-10-CM

## 2018-07-23 LAB — CBC WITH DIFFERENTIAL/PLATELET
Abs Immature Granulocytes: 0.03 10*3/uL (ref 0.00–0.07)
BASOS ABS: 0 10*3/uL (ref 0.0–0.1)
Basophils Relative: 0 %
EOS PCT: 1 %
Eosinophils Absolute: 0.1 10*3/uL (ref 0.0–0.5)
HCT: 30.4 % — ABNORMAL LOW (ref 39.0–52.0)
Hemoglobin: 9 g/dL — ABNORMAL LOW (ref 13.0–17.0)
Immature Granulocytes: 1 %
Lymphocytes Relative: 15 %
Lymphs Abs: 0.8 10*3/uL (ref 0.7–4.0)
MCH: 27.3 pg (ref 26.0–34.0)
MCHC: 29.6 g/dL — ABNORMAL LOW (ref 30.0–36.0)
MCV: 92.1 fL (ref 80.0–100.0)
Monocytes Absolute: 0.4 10*3/uL (ref 0.1–1.0)
Monocytes Relative: 7 %
Neutro Abs: 4.1 10*3/uL (ref 1.7–7.7)
Neutrophils Relative %: 76 %
Platelets: 200 10*3/uL (ref 150–400)
RBC: 3.3 MIL/uL — ABNORMAL LOW (ref 4.22–5.81)
RDW: 15.2 % (ref 11.5–15.5)
WBC: 5.4 10*3/uL (ref 4.0–10.5)
nRBC: 0 % (ref 0.0–0.2)

## 2018-07-23 LAB — BASIC METABOLIC PANEL
Anion gap: 6 (ref 5–15)
BUN: 39 mg/dL — ABNORMAL HIGH (ref 8–23)
CO2: 25 mmol/L (ref 22–32)
Calcium: 9.3 mg/dL (ref 8.9–10.3)
Chloride: 109 mmol/L (ref 98–111)
Creatinine, Ser: 1.53 mg/dL — ABNORMAL HIGH (ref 0.61–1.24)
GFR calc Af Amer: 48 mL/min — ABNORMAL LOW (ref >60)
GFR calc non Af Amer: 41 mL/min — ABNORMAL LOW (ref >60)
Glucose, Bld: 110 mg/dL — ABNORMAL HIGH (ref 70–99)
Potassium: 4.4 mmol/L (ref 3.5–5.1)
Sodium: 140 mmol/L (ref 135–145)

## 2018-07-23 LAB — FERRITIN: Ferritin: 35 ng/mL (ref 24–336)

## 2018-07-23 LAB — SAMPLE TO BLOOD BANK

## 2018-07-23 LAB — IRON AND TIBC
Iron: 42 ug/dL — ABNORMAL LOW (ref 45–182)
Saturation Ratios: 13 % — ABNORMAL LOW (ref 17.9–39.5)
TIBC: 319 ug/dL (ref 250–450)
UIBC: 277 ug/dL

## 2018-07-23 MED ORDER — IRON SUCROSE 20 MG/ML IV SOLN
200.0000 mg | Freq: Once | INTRAVENOUS | Status: AC
  Administered 2018-07-23: 200 mg via INTRAVENOUS
  Filled 2018-07-23: qty 10

## 2018-07-23 MED ORDER — SODIUM CHLORIDE 0.9 % IV SOLN
Freq: Once | INTRAVENOUS | Status: AC
  Administered 2018-07-23: 14:00:00 via INTRAVENOUS
  Filled 2018-07-23: qty 250

## 2018-07-23 NOTE — Assessment & Plan Note (Addendum)
#  Normocytic anemia-unclear etiology; status post bone marrow biopsy/NGS normal.?-related to chronic disease/? CKD.  # Today hemoglobin- 9.0 STABLE. Proceed with Venofer today.  If worse would recommend Aranesp.  #Bilateral hydro-nephrosis /hydroureter - bladder neck obstruction; followed by urology; s/p Urolift procedure.STABLE>   #CKD stage III creatinine 1.4-1.7.STABLE.   #Hyperkalemia potassium 5.4- improved.   # Metastatic prostate cancer Hormone sensitive- biochemical recurrence [M0]-  [on Lupron- 04/16/18]; STABLE; PSA- 2.36; monitor for now; STABLE.   # Left hand joint pain/stiffness-New- try recommend Tylenol prn; if not improved defer to PCP/rheumatology.   # DISPOSITION: add iron studies/ferritin today # IV venofer today; no blood.  # follow up in 8 weeks cbc/bmphold tube/MD/possible IV venofer; Dr.B

## 2018-07-23 NOTE — Progress Notes (Signed)
[place] OFFICE PROGRESS NOTE  Patient Care Team: [name], MD as PCP - General (Family Medicine) Bond, Tracie Harrier, MD as Consulting Physician (Ophthalmology) Abbie Sons, MD as Consulting Physician (Urology)  Cancer Staging No matching staging information was found for the patient.   Oncology History   # external beam radiation therapy and radiation seed implant in 2005 with Dr. Joelyn Oms and Dr. Danny Lawless for T1c Gleason 3+4 = 7 prostate cancer with a pretreatment PSA of 6.8. In November 2015, he was found to have biochemical recurrence.  Dec 2016- CT a/p & Bone scan- NEG; PSA- 11; June 2017- 19; STARTED on Firmagon [Dr.Budzyn]; on Lupron q 3M  # OCT 2018- Anemia-? Etiology; Likely anemia of chronic disease [anti-CCP elevated/ but NO RA; rheumatology; ]BMBx- hypercellular 30%; relative myeloid hyperplasia/erythroid hypoplasia; FISH- 5/7/8-Normal. II OPINION at [facility]- NEG  # CKD- III; Bladder neck obstruction s/p Foley [Dr.Stoiff]  # JAN 2018- Dexa scan- N ------------------------------------   DIAGNOSIS: PROSTATE CANCER  STAGE:  IV       ;GOALS: control  CURRENT/MOST RECENT THERAPY: Lupron      Primary adenocarcinoma of prostate ([place])      INTERVAL HISTORY:  Thomas Lopez 85 y.o.  male pleasant patient above history of anemia of unclear etiology is here for follow-up.  In the interim patient denies any blood in stools or black or stools.  States that he is passing urine without any major complaints.  He does complain of mild to moderate fatigue.  Mild shortness of breath on exertion.  He does complain of stiffness and pain in his joints of his left hand.  This is been happening over the last 3 weeks.  Review of Systems  Constitutional: Positive for malaise/fatigue. Negative for chills, diaphoresis and fever.  HENT: Negative for nosebleeds and sore throat.   Eyes: Negative for double vision.  Respiratory: Positive for  shortness of breath. Negative for cough, hemoptysis, sputum production and wheezing.   Cardiovascular: Negative for chest pain, palpitations, orthopnea and leg swelling.  Gastrointestinal: Negative for abdominal pain, blood in stool, constipation, diarrhea, heartburn, melena, nausea and vomiting.  Genitourinary: Negative for dysuria, frequency and urgency.  Musculoskeletal: Positive for joint pain. Negative for back pain.  Skin: Negative.  Negative for itching and rash.  Neurological: Negative for dizziness, tingling, focal weakness, weakness and headaches.  Endo/Heme/Allergies: Does not bruise/bleed easily.  Psychiatric/Behavioral: Negative for depression. The patient is not nervous/anxious and does not have insomnia.       PAST MEDICAL HISTORY :  Past Medical History:  Diagnosis Date  . Aorto-iliac atherosclerosis ([place]) 04/2015   by CT scan  . Carotid stenosis 11/2014   mild bilateral 1-39%, rpt 2 yrs  . Colon polyps    rpt colonoscopy due 2014  . ED (erectile dysfunction)    Trimix and VED  . Esophagitis   . Essential hypertension 06/29/2009  . GERD (gastroesophageal reflux disease)   . Glaucoma    severe ([place])  . HLD (hyperlipidemia)   . HTN (hypertension)   . Hyperglycemia   . IBS (irritable bowel syndrome)   . Prostate cancer [facility]) 2005   s/p seed implant, EBRT (Dr. Alinda Money at [facility]) recurrent treating with androgen deprivation referred to cancer center  . Smoking history     PAST SURGICAL HISTORY :   Past Surgical History:  Procedure Laterality Date  . CATARACT EXTRACTION Left 05/2017   Dr. Edilia Bo  . CATARACT EXTRACTION, BILATERAL  December 2016  [place], Dr. Raynelle Fanning  . COLONOSCOPY  03/01/01   Multiple polyps, divertic//adenomatous polyps  . COLONOSCOPY  04/24/01   multiple polyps  . COLONOSCOPY  10/23/01   Polyps benign-repeat every 2 years  . COLONOSCOPY  06/23/04   Adenom/hyperplastic polyps; multiple external hemorrhoids  . COLONOSCOPY   06/08/07   single small polyp-repeat 5 years  . CYSTOSCOPY WITH INSERTION OF UROLIFT N/A 04/10/2018   Procedure: CYSTOSCOPY WITH INSERTION OF UROLIFT;  Surgeon: Abbie Sons, MD;  Location: [facility] ORS;  Service: Urology;  Laterality: N/A;  . ESOPHAGOGASTRODUODENOSCOPY  03/01/01   H.H.; gastritis esoph. dudodenitis  . NCS/Wrists Left Carpal  2/02   Min./right improved  . PFTs  10/2012   FVC 64%, FEV1 41%, ratio 0.62  . PROSTATE BIOPSY  12/04   positive; radioactive seed implant (Dr. Joelyn Oms)  . Prostate Ext Beam Radiation  1/27-07/23/03   Dr. Danny Lawless  . SPIROMETRY  09/2011   WNL  . TONSILLECTOMY    . Wrist Release  6/98; 9/99   Right    FAMILY HISTORY :   Family History  Problem Relation Age of Onset  . Cancer Mother        colon  . Hyperlipidemia Mother   . Other Mother        Carotid disease  . Coronary artery disease Neg Hx   . Stroke Neg Hx     SOCIAL HISTORY:   Social History   Tobacco Use  . Smoking status: Former Smoker    Last attempt to quit: 05/23/1993    Years since quitting: 25.1  . Smokeless tobacco: Never Used  Substance Use Topics  . Alcohol use: Yes    Comment: Rare  . Drug use: No    ALLERGIES:  has No Known Allergies.  MEDICATIONS:  Current Outpatient Medications  Medication Sig Dispense Refill  . aspirin EC 81 MG tablet Take 81 mg by mouth daily.    . Calcium Carbonate-Vitamin D (CALCIUM 600/VITAMIN D) 600-400 MG-UNIT chew tablet Chew 1 tablet by mouth daily.    . ferrous sulfate 325 (65 FE) MG tablet Take 1 tablet (325 mg total) by mouth daily with breakfast.  3  . leuprolide (LUPRON) 22.5 MG injection Inject 22.5 mg into the muscle every 3 (three) months.    Marland Kitchen lisinopril (PRINIVIL,ZESTRIL) 5 MG tablet Take 1 tablet (5 mg total) by mouth daily. 90 tablet 2  . methylcellulose oral powder Take by mouth daily.    Marland Kitchen omeprazole (PRILOSEC) 40 MG capsule Take 1 capsule (40 mg total) by mouth daily. 90 capsule 3  . rosuvastatin (CRESTOR) 10 MG tablet  Take 1 tablet (10 mg total) by mouth daily. 90 tablet 3  . tamsulosin (FLOMAX) 0.4 MG CAPS capsule Take 0.4 mg by mouth. After supper    . timolol (TIMOPTIC) 0.5 % ophthalmic solution Place 1 drop into the left eye 2 (two) times daily.     Marland Kitchen venlafaxine XR (EFFEXOR-XR) 37.5 MG 24 hr capsule Take 1 capsule (37.5 mg total) by mouth daily with breakfast. 90 capsule 3   No current facility-administered medications for this visit.    Facility-Administered Medications Ordered in Other Visits  Medication Dose Route Frequency Provider Last Rate Last Dose  . iron sucrose (VENOFER) injection 200 mg  200 mg Intravenous Once Cammie Sickle, MD        PHYSICAL EXAMINATION: ECOG PERFORMANCE STATUS: 1 - Symptomatic but completely ambulatory  BP (!) 156/68 (BP Location: Left Arm, Patient  Position: Sitting, Cuff Size: Normal)   Pulse (!) 51   Temp 97.6 F (36.4 C) (Tympanic)   Resp 16   Wt 152 lb 9.6 oz (69.2 kg)   BMI 22.54 kg/m   Filed Weights   07/23/18 1315  Weight: 152 lb 9.6 oz (69.2 kg)    Physical Exam  Constitutional: He is oriented to person, place, and time.  Thin built cachectic appearing male patient .  He is alone.  Walking by himself.  Appears pale.  HENT:  Head: Normocephalic and atraumatic.  Mouth/Throat: Oropharynx is clear and moist. No oropharyngeal exudate.  Eyes: Pupils are equal, round, and reactive to light.  Neck: Normal range of motion. Neck supple.  Cardiovascular: Normal rate and regular rhythm.  Pulmonary/Chest: No respiratory distress. He has no wheezes.  Abdominal: Soft. Bowel sounds are normal. He exhibits no distension and no mass. There is no abdominal tenderness. There is no rebound and no guarding.  Musculoskeletal: Normal range of motion.        General: No tenderness or edema.  Neurological: He is alert and oriented to person, place, and time.  Skin: Skin is warm.  Psychiatric: Affect normal.   LABORATORY DATA:  I have reviewed the data as  listed    Component Value Date/Time   NA 140 07/23/2018 1252   K 4.4 07/23/2018 1252   CL 109 07/23/2018 1252   CO2 25 07/23/2018 1252   GLUCOSE 110 (H) 07/23/2018 1252   BUN 39 (H) 07/23/2018 1252   CREATININE 1.53 (H) 07/23/2018 1252   CALCIUM 9.3 07/23/2018 1252   PROT 7.4 05/28/2018 1251   ALBUMIN 4.0 05/28/2018 1251   AST 18 05/28/2018 1251   ALT 12 05/28/2018 1251   ALKPHOS 56 05/28/2018 1251   BILITOT 0.4 05/28/2018 1251   GFRNONAA 41 (L) 07/23/2018 1252   GFRAA 48 (L) 07/23/2018 1252    No results found for: SPEP, UPEP  Lab Results  Component Value Date   WBC 5.4 07/23/2018   NEUTROABS 4.1 07/23/2018   HGB 9.0 (L) 07/23/2018   HCT 30.4 (L) 07/23/2018   MCV 92.1 07/23/2018   PLT 200 07/23/2018      Chemistry      Component Value Date/Time   NA 140 07/23/2018 1252   K 4.4 07/23/2018 1252   CL 109 07/23/2018 1252   CO2 25 07/23/2018 1252   BUN 39 (H) 07/23/2018 1252   CREATININE 1.53 (H) 07/23/2018 1252      Component Value Date/Time   CALCIUM 9.3 07/23/2018 1252   ALKPHOS 56 05/28/2018 1251   AST 18 05/28/2018 1251   ALT 12 05/28/2018 1251   BILITOT 0.4 05/28/2018 1251       RADIOGRAPHIC STUDIES: I have personally reviewed the radiological images as listed and agreed with the findings in the report. No results found.   ASSESSMENT & PLAN:  Normocytic anemia #Normocytic anemia-unclear etiology; status post bone marrow biopsy/NGS normal.?-related to chronic disease/? CKD.  # Today hemoglobin- 9.0 STABLE. Proceed with Venofer today.  If worse would recommend Aranesp.  #Bilateral hydro-nephrosis /hydroureter - bladder neck obstruction; followed by urology; s/p Urolift procedure.STABLE>   #CKD stage III creatinine 1.4-1.7.STABLE.   #Hyperkalemia potassium 5.4- improved.   # Metastatic prostate cancer Hormone sensitive- biochemical recurrence [M0]-  [on Lupron- 04/16/18]; STABLE; PSA- 2.36; monitor for now; STABLE.   # Left hand joint  pain/stiffness-New- try recommend Tylenol prn; if not improved defer to PCP/rheumatology.   # DISPOSITION: add iron studies/ferritin today #  IV venofer today; no blood.  # follow up in 8 weeks cbc/bmphold tube/MD/possible IV venofer; Dr.B   Orders Placed This Encounter  Procedures  . Iron and TIBC    Standing Status:   Future    Number of Occurrences:   1    Standing Expiration Date:   07/23/2019  . Ferritin    Standing Status:   Future    Number of Occurrences:   1    Standing Expiration Date:   07/23/2019   All questions were answered. The patient knows to call the clinic with any problems, questions or concerns.      Cammie Sickle, MD 07/23/2018 1:59 PM

## 2018-08-07 ENCOUNTER — Encounter: Payer: Self-pay | Admitting: Family Medicine

## 2018-08-07 DIAGNOSIS — Z515 Encounter for palliative care: Secondary | ICD-10-CM | POA: Insufficient documentation

## 2018-08-27 DIAGNOSIS — H401133 Primary open-angle glaucoma, bilateral, severe stage: Secondary | ICD-10-CM | POA: Diagnosis not present

## 2018-09-19 ENCOUNTER — Telehealth: Payer: Self-pay | Admitting: *Deleted

## 2018-09-19 NOTE — Telephone Encounter (Signed)
Contacted patient regarding the need for a telehealth visit for his 5/4 apt. Pt prefers a telephone visit only. patient DOES NOT HAVE A SMART PHONE. pls contact- [phone]  I spoke with Dr. B and the patient. Pt would like to have md telephone visit the next day. Pt aware that md would reach out to him on 5/5 approx. Around 10:15 am with a phone call. He was instructed to keep his lab/iron infusions as scheduled on Monday. 5/4

## 2018-09-24 ENCOUNTER — Inpatient Hospital Stay: Payer: Medicare PPO | Attending: Internal Medicine

## 2018-09-24 ENCOUNTER — Other Ambulatory Visit: Payer: Self-pay

## 2018-09-24 ENCOUNTER — Inpatient Hospital Stay: Payer: Medicare PPO | Admitting: Internal Medicine

## 2018-09-24 ENCOUNTER — Inpatient Hospital Stay: Payer: Medicare PPO

## 2018-09-24 VITALS — BP 154/56 | HR 60 | Temp 96.9°F | Resp 18

## 2018-09-24 DIAGNOSIS — N320 Bladder-neck obstruction: Secondary | ICD-10-CM | POA: Insufficient documentation

## 2018-09-24 DIAGNOSIS — G8929 Other chronic pain: Secondary | ICD-10-CM | POA: Diagnosis not present

## 2018-09-24 DIAGNOSIS — Z923 Personal history of irradiation: Secondary | ICD-10-CM | POA: Diagnosis not present

## 2018-09-24 DIAGNOSIS — M2550 Pain in unspecified joint: Secondary | ICD-10-CM | POA: Diagnosis not present

## 2018-09-24 DIAGNOSIS — D649 Anemia, unspecified: Secondary | ICD-10-CM | POA: Insufficient documentation

## 2018-09-24 DIAGNOSIS — Z79899 Other long term (current) drug therapy: Secondary | ICD-10-CM | POA: Insufficient documentation

## 2018-09-24 DIAGNOSIS — Z79818 Long term (current) use of other agents affecting estrogen receptors and estrogen levels: Secondary | ICD-10-CM | POA: Diagnosis not present

## 2018-09-24 DIAGNOSIS — C61 Malignant neoplasm of prostate: Secondary | ICD-10-CM | POA: Diagnosis not present

## 2018-09-24 DIAGNOSIS — N183 Chronic kidney disease, stage 3 (moderate): Secondary | ICD-10-CM | POA: Diagnosis not present

## 2018-09-24 LAB — CBC WITH DIFFERENTIAL/PLATELET
Abs Immature Granulocytes: 0.02 10*3/uL (ref 0.00–0.07)
Basophils Absolute: 0 10*3/uL (ref 0.0–0.1)
Basophils Relative: 0 %
Eosinophils Absolute: 0.2 10*3/uL (ref 0.0–0.5)
Eosinophils Relative: 3 %
HCT: 26.2 % — ABNORMAL LOW (ref 39.0–52.0)
Hemoglobin: 7.8 g/dL — ABNORMAL LOW (ref 13.0–17.0)
Immature Granulocytes: 0 %
Lymphocytes Relative: 18 %
Lymphs Abs: 1.1 10*3/uL (ref 0.7–4.0)
MCH: 27.7 pg (ref 26.0–34.0)
MCHC: 29.8 g/dL — ABNORMAL LOW (ref 30.0–36.0)
MCV: 92.9 fL (ref 80.0–100.0)
Monocytes Absolute: 0.3 10*3/uL (ref 0.1–1.0)
Monocytes Relative: 5 %
Neutro Abs: 4.6 10*3/uL (ref 1.7–7.7)
Neutrophils Relative %: 74 %
Platelets: 276 10*3/uL (ref 150–400)
RBC: 2.82 MIL/uL — ABNORMAL LOW (ref 4.22–5.81)
RDW: 15.3 % (ref 11.5–15.5)
WBC: 6.3 10*3/uL (ref 4.0–10.5)
nRBC: 0 % (ref 0.0–0.2)

## 2018-09-24 LAB — BASIC METABOLIC PANEL
Anion gap: 9 (ref 5–15)
BUN: 45 mg/dL — ABNORMAL HIGH (ref 8–23)
CO2: 22 mmol/L (ref 22–32)
Calcium: 9.2 mg/dL (ref 8.9–10.3)
Chloride: 109 mmol/L (ref 98–111)
Creatinine, Ser: 1.4 mg/dL — ABNORMAL HIGH (ref 0.61–1.24)
GFR calc Af Amer: 53 mL/min — ABNORMAL LOW (ref >60)
GFR calc non Af Amer: 46 mL/min — ABNORMAL LOW (ref >60)
Glucose, Bld: 159 mg/dL — ABNORMAL HIGH (ref 70–99)
Potassium: 4.4 mmol/L (ref 3.5–5.1)
Sodium: 140 mmol/L (ref 135–145)

## 2018-09-24 LAB — SAMPLE TO BLOOD BANK

## 2018-09-24 MED ORDER — SODIUM CHLORIDE 0.9 % IV SOLN
Freq: Once | INTRAVENOUS | Status: AC
  Administered 2018-09-24: 13:00:00 via INTRAVENOUS
  Filled 2018-09-24: qty 250

## 2018-09-24 MED ORDER — IRON SUCROSE 20 MG/ML IV SOLN
200.0000 mg | Freq: Once | INTRAVENOUS | Status: AC
  Administered 2018-09-24: 200 mg via INTRAVENOUS
  Filled 2018-09-24: qty 10

## 2018-09-24 NOTE — Progress Notes (Signed)
Hemoglobin: 7.8. MD, Dr. Rogue Bussing, notified and aware. Per MD order: proceed with Venofer treatment today.

## 2018-09-25 ENCOUNTER — Inpatient Hospital Stay (HOSPITAL_BASED_OUTPATIENT_CLINIC_OR_DEPARTMENT_OTHER): Payer: Medicare PPO | Admitting: Internal Medicine

## 2018-09-25 ENCOUNTER — Encounter: Payer: Self-pay | Admitting: Internal Medicine

## 2018-09-25 ENCOUNTER — Other Ambulatory Visit: Payer: Self-pay

## 2018-09-25 ENCOUNTER — Other Ambulatory Visit: Payer: Self-pay | Admitting: Family Medicine

## 2018-09-25 DIAGNOSIS — C61 Malignant neoplasm of prostate: Secondary | ICD-10-CM | POA: Diagnosis not present

## 2018-09-25 DIAGNOSIS — D649 Anemia, unspecified: Secondary | ICD-10-CM

## 2018-09-25 DIAGNOSIS — N183 Chronic kidney disease, stage 3 (moderate): Secondary | ICD-10-CM

## 2018-09-25 NOTE — Assessment & Plan Note (Addendum)
#  Normocytic anemia-unclear etiology; status post bone marrow biopsy/NGS normal.?-related to chronic disease/? CKD.  # Today hemoglobin-7.8 ; slightly worse than baseline.  Proceed with Venofer.  Will recommend adding Aranesp if hemoglobin continues to run low/not improved with Venofer.  #Reviewed the potential side effects of Aranesp including but not limited to elevated blood pressure risk of strokes with hemoglobin targetable 12-13.  Patient will receive Aranesp only if hemoglobin less than 10.  #CKD stage III creatinine 1.4 -bilateral hydro-nephrosis /hydroureter - bladder neck obstruction-followed by urology stable.  # Metastatic prostate cancer Hormone sensitive- biochemical recurrence [M0]-  [on Lupron- 04/16/18]; STABLE; August 2019 PSA- 2.36; monitor for now.  We will recheck PSA at next visit; will be due for Lupron at next visit in 3 weeks  # DISPOSITION:  # In 3 weeks-Labs cbc/CMP/PSA/Lupron;  possible aranesp; Next day later MD visit.  Dr.B

## 2018-09-25 NOTE — Progress Notes (Signed)
I connected with Thomas Lopez on 09/25/18 at 10:15 AM EDT by telephone visit and verified that I am speaking with the correct person using two identifiers.  I discussed the limitations, risks, security and privacy concerns of performing an evaluation and management service by telemedicine and the availability of in-person appointments. I also discussed with the patient that there may be a patient responsible charge related to this service. The patient expressed understanding and agreed to proceed.    Other persons participating in the visit and their role in the encounter: none  Patient's location: home  Provider's location: home   Oncology History   # external beam radiation therapy and radiation seed implant in 2005 with Dr. Joelyn Oms and Dr. Danny Lawless for T1c Gleason 3+4 = 7 prostate cancer with a pretreatment PSA of 6.8. In November 2015, he was found to have biochemical recurrence.  Dec 2016- CT a/p & Bone scan- NEG; PSA- 11; June 2017- 19; STARTED on Firmagon [Dr.Budzyn]; on Lupron q 7M  # OCT 2018- Anemia-? Etiology; Likely anemia of chronic disease [anti-CCP elevated/ but NO RA; rheumatology; ]BMBx- hypercellular 30%; relative myeloid hyperplasia/erythroid hypoplasia; FISH- 5/7/8-Normal. II OPINION at [facility]. [facility]- NEG  # CKD- III; Bladder neck obstruction s/p Foley [Dr.Stoiff]  # JAN 2018- Dexa scan- N ------------------------------------   DIAGNOSIS: PROSTATE CANCER  STAGE:  IV       ;GOALS: control  CURRENT/MOST RECENT THERAPY: Lupron      Primary adenocarcinoma of prostate ([place])     Chief Complaint: anemia    History of present illness:Thomas Lopez 85 y.o.  male with history of prostate cancer biochemical recurrence on Lupron; and also history of anemia question chronic disease/CKD.  Patient denies any unusual fatigue denies any nausea vomiting.  No blood in stools or black or stools.  Denies any worsening bone pain.  Has chronic joint pains.  Not any  worse.  Observation/objective: Hemoglobin 7.8.  Creatinine 1.4.  Stable  Assessment and plan: Normocytic anemia #Normocytic anemia-unclear etiology; status post bone marrow biopsy/NGS normal.?-related to chronic disease/? CKD.  # Today hemoglobin-7.8 ; slightly worse than baseline.  Proceed with Venofer.  Will recommend adding Aranesp if hemoglobin continues to run low/not improved with Venofer.  #Reviewed the potential side effects of Aranesp including but not limited to elevated blood pressure risk of strokes with hemoglobin targetable 12-13.  Patient will receive Aranesp only if hemoglobin less than 10.  #CKD stage III creatinine 1.4 -bilateral hydro-nephrosis /hydroureter - bladder neck obstruction-followed by urology stable.  # Metastatic prostate cancer Hormone sensitive- biochemical recurrence [M0]-  [on Lupron- 04/16/18]; STABLE; August 2019 PSA- 2.36; monitor for now.  We will recheck PSA at next visit; will be due for Lupron at next visit in 3 weeks  # DISPOSITION:  # In 3 weeks-Labs cbc/CMP/PSA/Lupron;  possible aranesp; Next day later MD visit.  Dr.B    Follow-up instructions:  I discussed the assessment and treatment plan with the patient.  The patient was provided an opportunity to ask questions and all were answered.  The patient agreed with the plan and demonstrated understanding of instructions.  The patient was advised to call back or seek an in person evaluation if the symptoms worsen or if the condition fails to improve as anticipated.  I provided 12 minutes of non face-to-face telephone visit time during this encounter, and > 50% was spent counseling as documented under my assessment & plan.   Dr. Charlaine Dalton [place] at [facility]  09/25/2018 11:03 AM

## 2018-10-01 ENCOUNTER — Other Ambulatory Visit: Payer: Self-pay | Admitting: Internal Medicine

## 2018-10-03 ENCOUNTER — Telehealth: Payer: Self-pay | Admitting: Internal Medicine

## 2018-10-03 ENCOUNTER — Other Ambulatory Visit: Payer: Self-pay | Admitting: *Deleted

## 2018-10-03 DIAGNOSIS — D649 Anemia, unspecified: Secondary | ICD-10-CM

## 2018-10-03 NOTE — Telephone Encounter (Signed)
Iron studies added. aranesp taken off schedule per md order

## 2018-10-03 NOTE — Telephone Encounter (Signed)
Cancel patient's appointment for Aranesp at next visit.  Keep Lupron  Brooke-please also add iron studies and ferritin at next visit. [We will decide on Aranesp based on iron studies]  Thanks GB

## 2018-10-09 ENCOUNTER — Other Ambulatory Visit: Payer: Self-pay | Admitting: Internal Medicine

## 2018-10-16 ENCOUNTER — Other Ambulatory Visit: Payer: Self-pay

## 2018-10-16 ENCOUNTER — Inpatient Hospital Stay: Payer: Medicare PPO

## 2018-10-16 ENCOUNTER — Telehealth: Payer: Self-pay | Admitting: Internal Medicine

## 2018-10-16 DIAGNOSIS — Z923 Personal history of irradiation: Secondary | ICD-10-CM | POA: Diagnosis not present

## 2018-10-16 DIAGNOSIS — G8929 Other chronic pain: Secondary | ICD-10-CM | POA: Diagnosis not present

## 2018-10-16 DIAGNOSIS — D649 Anemia, unspecified: Secondary | ICD-10-CM | POA: Diagnosis not present

## 2018-10-16 DIAGNOSIS — C61 Malignant neoplasm of prostate: Secondary | ICD-10-CM

## 2018-10-16 DIAGNOSIS — N183 Chronic kidney disease, stage 3 (moderate): Secondary | ICD-10-CM | POA: Diagnosis not present

## 2018-10-16 DIAGNOSIS — Z79899 Other long term (current) drug therapy: Secondary | ICD-10-CM | POA: Diagnosis not present

## 2018-10-16 DIAGNOSIS — N320 Bladder-neck obstruction: Secondary | ICD-10-CM | POA: Diagnosis not present

## 2018-10-16 DIAGNOSIS — M2550 Pain in unspecified joint: Secondary | ICD-10-CM | POA: Diagnosis not present

## 2018-10-16 DIAGNOSIS — Z79818 Long term (current) use of other agents affecting estrogen receptors and estrogen levels: Secondary | ICD-10-CM | POA: Diagnosis not present

## 2018-10-16 LAB — CBC WITH DIFFERENTIAL/PLATELET
Abs Immature Granulocytes: 0.04 10*3/uL (ref 0.00–0.07)
Basophils Absolute: 0 10*3/uL (ref 0.0–0.1)
Basophils Relative: 0 %
Eosinophils Absolute: 0.1 10*3/uL (ref 0.0–0.5)
Eosinophils Relative: 1 %
HCT: 31.4 % — ABNORMAL LOW (ref 39.0–52.0)
Hemoglobin: 9.3 g/dL — ABNORMAL LOW (ref 13.0–17.0)
Immature Granulocytes: 1 %
Lymphocytes Relative: 13 %
Lymphs Abs: 0.9 10*3/uL (ref 0.7–4.0)
MCH: 27.4 pg (ref 26.0–34.0)
MCHC: 29.6 g/dL — ABNORMAL LOW (ref 30.0–36.0)
MCV: 92.4 fL (ref 80.0–100.0)
Monocytes Absolute: 0.4 10*3/uL (ref 0.1–1.0)
Monocytes Relative: 5 %
Neutro Abs: 5.4 10*3/uL (ref 1.7–7.7)
Neutrophils Relative %: 80 %
Platelets: 244 10*3/uL (ref 150–400)
RBC: 3.4 MIL/uL — ABNORMAL LOW (ref 4.22–5.81)
RDW: 16 % — ABNORMAL HIGH (ref 11.5–15.5)
WBC: 6.8 10*3/uL (ref 4.0–10.5)
nRBC: 0 % (ref 0.0–0.2)

## 2018-10-16 LAB — COMPREHENSIVE METABOLIC PANEL
ALT: 12 U/L (ref 0–44)
AST: 19 U/L (ref 15–41)
Albumin: 4.1 g/dL (ref 3.5–5.0)
Alkaline Phosphatase: 63 U/L (ref 38–126)
Anion gap: 7 (ref 5–15)
BUN: 50 mg/dL — ABNORMAL HIGH (ref 8–23)
CO2: 24 mmol/L (ref 22–32)
Calcium: 9.6 mg/dL (ref 8.9–10.3)
Chloride: 111 mmol/L (ref 98–111)
Creatinine, Ser: 1.52 mg/dL — ABNORMAL HIGH (ref 0.61–1.24)
GFR calc Af Amer: 48 mL/min — ABNORMAL LOW (ref >60)
GFR calc non Af Amer: 42 mL/min — ABNORMAL LOW (ref >60)
Glucose, Bld: 134 mg/dL — ABNORMAL HIGH (ref 70–99)
Potassium: 4.2 mmol/L (ref 3.5–5.1)
Sodium: 142 mmol/L (ref 135–145)
Total Bilirubin: 0.4 mg/dL (ref 0.3–1.2)
Total Protein: 7.7 g/dL (ref 6.5–8.1)

## 2018-10-16 LAB — IRON AND TIBC
Iron: 214 ug/dL — ABNORMAL HIGH (ref 45–182)
Saturation Ratios: 55 % — ABNORMAL HIGH (ref 17.9–39.5)
TIBC: 386 ug/dL (ref 250–450)
UIBC: 172 ug/dL

## 2018-10-16 LAB — PSA: Prostatic Specific Antigen: 2.08 ng/mL (ref 0.00–4.00)

## 2018-10-16 LAB — FERRITIN: Ferritin: 59 ng/mL (ref 24–336)

## 2018-10-16 MED ORDER — LEUPROLIDE ACETATE (3 MONTH) 22.5 MG IM KIT
22.5000 mg | PACK | Freq: Once | INTRAMUSCULAR | Status: AC
  Administered 2018-10-16: 11:00:00 22.5 mg via INTRAMUSCULAR
  Filled 2018-10-16: qty 22.5

## 2018-10-17 ENCOUNTER — Other Ambulatory Visit: Payer: Self-pay | Admitting: *Deleted

## 2018-10-17 ENCOUNTER — Encounter: Payer: Self-pay | Admitting: Internal Medicine

## 2018-10-17 ENCOUNTER — Other Ambulatory Visit: Payer: Self-pay | Admitting: Family Medicine

## 2018-10-17 ENCOUNTER — Inpatient Hospital Stay (HOSPITAL_BASED_OUTPATIENT_CLINIC_OR_DEPARTMENT_OTHER): Payer: Medicare PPO | Admitting: Internal Medicine

## 2018-10-17 DIAGNOSIS — C61 Malignant neoplasm of prostate: Secondary | ICD-10-CM

## 2018-10-17 DIAGNOSIS — N183 Chronic kidney disease, stage 3 unspecified: Secondary | ICD-10-CM

## 2018-10-17 DIAGNOSIS — D649 Anemia, unspecified: Secondary | ICD-10-CM | POA: Diagnosis not present

## 2018-10-17 DIAGNOSIS — D631 Anemia in chronic kidney disease: Secondary | ICD-10-CM

## 2018-10-17 DIAGNOSIS — N189 Chronic kidney disease, unspecified: Secondary | ICD-10-CM

## 2018-10-17 NOTE — Assessment & Plan Note (Addendum)
#  Normocytic anemia-unclear etiology; status post bone marrow biopsy/NGS normal.?-related to chronic disease/? CKD.  #Hemoglobin 9.3.  Improved status post IV Venofer.  Will start retacrit if hemoglobin continues to take less than 10.  #CKD stage III creatinine 1.5 -bilateral hydro-nephrosis /hydroureter -followed closely by urology.  # Metastatic prostate cancer Hormone sensitive- biochemical recurrence [M0]-on Lupron May 26th-PSA-2.08.   # DISPOSITION:  # In 3 weeks-Labs-H&H- possible retacrit. # follow up in 7 weeks- MD clinic-labs- cbc/bmp;possible Retacrit-Dr.B

## 2018-10-17 NOTE — Progress Notes (Signed)
I connected with Thomas Lopez on 10/17/18 at  2:00 PM EDT by telephone visit and verified that I am speaking with the correct person using two identifiers.  I discussed the limitations, risks, security and privacy concerns of performing an evaluation and management service by telemedicine and the availability of in-person appointments. I also discussed with the patient that there may be a patient responsible charge related to this service. The patient expressed understanding and agreed to proceed.    Other persons participating in the visit and their role in the encounter: Nurse/medical reconciliation Patient's location: Home Provider's location: Home  Oncology History   # external beam radiation therapy and radiation seed implant in 2005 with Dr. Joelyn Oms and Dr. Danny Lawless for T1c Gleason 3+4 = 7 prostate cancer with a pretreatment PSA of 6.8. In November 2015, he was found to have biochemical recurrence.  Dec 2016- CT a/p & Bone scan- NEG; PSA- 11; June 2017- 19; STARTED on Firmagon [Dr.Budzyn]; on Lupron q 23M  # OCT 2018- Anemia-? Etiology; Likely anemia of chronic disease [anti-CCP elevated/ but NO RA; rheumatology; ]BMBx- hypercellular 30%; relative myeloid hyperplasia/erythroid hypoplasia; FISH- 5/7/8-Normal. [facility]- NEG  # CKD- III; Bladder neck obstruction s/p Foley [Dr.Stoiff]  # JAN 2018- Dexa scan- N ------------------------------------   DIAGNOSIS: PROSTATE CANCER  STAGE:  IV       ;GOALS: control  CURRENT/MOST RECENT THERAPY: Lupron      Primary adenocarcinoma of prostate ([place])     Chief Complaint: Anemia prostate cancer   History of present illness:Thomas Lopez 85 y.o.  male with history of anemia of unclear etiology and also prostate cancer biochemical recurrence for follow-up.  Patient denies any unusual fatigue.  Denies any blood in stools or black or stools.  Chronic joint pains.  Observation/objective:  Assessment and  plan: Normocytic anemia #Normocytic anemia-unclear etiology; status post bone marrow biopsy/NGS normal.?-related to chronic disease/? CKD.  #Hemoglobin 9.3.  Improved status post IV Venofer.  Will start retacrit if hemoglobin continues to take less than 10.  #CKD stage III creatinine 1.5 -bilateral hydro-nephrosis /hydroureter -followed closely by urology.  # Metastatic prostate cancer Hormone sensitive- biochemical recurrence [M0]-on Lupron May 26th-PSA-2.08.   # DISPOSITION:  # In 3 weeks-Labs-H&H- possible retacrit. # follow up in 7 weeks- MD clinic-labs- cbc/bmp;possible Retacrit-Dr.B     Follow-up instructions:  I discussed the assessment and treatment plan with the patient.  The patient was provided an opportunity to ask questions and all were answered.  The patient agreed with the plan and demonstrated understanding of instructions.  The patient was advised to call back or seek an in person evaluation if the symptoms worsen or if the condition fails to improve as anticipated.  I provided 12  minutes of non face-to-face telephone visit time during this encounter, and > 50% was spent counseling as documented under my assessment & plan.   Dr. Charlaine Dalton [place] at [facility] 10/17/2018 2:24 PM

## 2018-10-17 NOTE — Progress Notes (Signed)
Patient c/o urinary incontinence. Next f/u with urology end of June.

## 2018-10-27 ENCOUNTER — Other Ambulatory Visit: Payer: Self-pay | Admitting: Family Medicine

## 2018-10-31 ENCOUNTER — Other Ambulatory Visit: Payer: Medicare PPO

## 2018-10-31 ENCOUNTER — Ambulatory Visit (INDEPENDENT_AMBULATORY_CARE_PROVIDER_SITE_OTHER): Payer: Medicare PPO

## 2018-10-31 DIAGNOSIS — Z0000 Encounter for general adult medical examination without abnormal findings: Secondary | ICD-10-CM

## 2018-10-31 DIAGNOSIS — R778 Other specified abnormalities of plasma proteins: Secondary | ICD-10-CM

## 2018-10-31 DIAGNOSIS — D472 Monoclonal gammopathy: Secondary | ICD-10-CM | POA: Diagnosis not present

## 2018-10-31 NOTE — Progress Notes (Signed)
PCP notes:   Health maintenance:  Tetanus vaccine - postponed/insurance  Abnormal screenings:   None  Patient concerns:   None  Nurse concerns:  None  Next PCP appt:   11/07/18 @ 1600

## 2018-10-31 NOTE — Progress Notes (Signed)
Subjective:   Thomas Lopez is a 85 y.o. male who presents for Medicare Annual/Subsequent preventive examination.  Review of Systems:  N/A Cardiac Risk Factors include: advanced age (>76men, >82 women);male gender;hypertension;dyslipidemia     Objective:    Vitals: There were no vitals taken for this visit.  There is no height or weight on file to calculate BMI.  Advanced Directives 10/31/2018 10/17/2018 09/25/2018 07/23/2018 05/28/2018 04/16/2018 03/29/2018  Does Patient Have a Medical Advance Directive? Yes Yes Yes No No No No  Type of Paramedic of [place];Living will [place];Living will Living will;Healthcare Power of Attorney - - - -  Does patient want to make changes to medical advance directive? - No - Patient declined No - Patient declined - - - -  Copy of [place] in Chart? No - copy requested No - copy requested No - copy requested - - - -  Would patient like information on creating a medical advance directive? No - Patient declined No - Patient declined - No - Patient declined No - Patient declined No - Patient declined Yes (MAU/Ambulatory/Procedural Areas - Information given)    Tobacco Social History   Tobacco Use  Smoking Status Former Smoker  . Last attempt to quit: 05/23/1993  . Years since quitting: 25.4  Smokeless Tobacco Never Used     Counseling given: No   Clinical Intake:  Pre-visit preparation completed: Yes  Pain : No/denies pain Pain Score: 0-No pain     Nutritional Status: BMI 25 -29 Overweight Nutritional Risks: None  How often do you need to have someone help you when you read instructions, pamphlets, or other written materials from your doctor or pharmacy?: 1 - Never What is the last grade level you completed in school?: 12th grade + 2 yrs college  Interpreter Needed?: No  Comments: pt lives independently Information entered by :: LPinson, LPN  Past Medical History:  Diagnosis  Date  . Aorto-iliac atherosclerosis ([place]) 04/2015   by CT scan  . Carotid stenosis 11/2014   mild bilateral 1-39%, rpt 2 yrs  . Colon polyps    rpt colonoscopy due 2014  . ED (erectile dysfunction)    Trimix and VED  . Esophagitis   . Essential hypertension 06/29/2009  . GERD (gastroesophageal reflux disease)   . Glaucoma    severe ([place])  . HLD (hyperlipidemia)   . HTN (hypertension)   . Hyperglycemia   . IBS (irritable bowel syndrome)   . Prostate cancer [facility]) 2005   s/p seed implant, EBRT (Dr. Alinda Money at [facility]) recurrent treating with androgen deprivation referred to cancer center  . Smoking history    Past Surgical History:  Procedure Laterality Date  . CATARACT EXTRACTION Left 05/2017   Dr. Edilia Bo  . CATARACT EXTRACTION, BILATERAL  December 2016   [place], Dr. Raynelle Fanning  . COLONOSCOPY  03/01/01   Multiple polyps, divertic//adenomatous polyps  . COLONOSCOPY  04/24/01   multiple polyps  . COLONOSCOPY  10/23/01   Polyps benign-repeat every 2 years  . COLONOSCOPY  06/23/04   [place]/hyperplastic polyps; multiple external hemorrhoids  . COLONOSCOPY  06/08/07   single small polyp-repeat 5 years  . CYSTOSCOPY WITH INSERTION OF UROLIFT N/A 04/10/2018   Procedure: CYSTOSCOPY WITH INSERTION OF UROLIFT;  Surgeon: Abbie Sons, MD;  Location: [facility] ORS;  Service: Urology;  Laterality: N/A;  . ESOPHAGOGASTRODUODENOSCOPY  03/01/01   H.H.; gastritis esoph. dudodenitis  . NCS/Wrists Left Carpal  2/02   Min./right improved  . PFTs  10/2012   FVC 64%, FEV1 41%, ratio 0.62  . PROSTATE BIOPSY  12/04   positive; radioactive seed implant (Dr. Joelyn Oms)  . Prostate Ext Beam Radiation  1/27-07/23/03   Dr. Danny Lawless  . SPIROMETRY  09/2011   WNL  . TONSILLECTOMY    . Wrist Release  6/98; 9/99   Right   Family History  Problem Relation Age of Onset  . Cancer Mother        colon  . Hyperlipidemia Mother   . Other Mother        Carotid disease  . Coronary artery disease Neg  Hx   . Stroke Neg Hx    Social History   Socioeconomic History  . Marital status: Married    Spouse name: Not on file  . Number of children: 5  . Years of education: Not on file  . Highest education level: Not on file  Occupational History  . Occupation: Retired  Scientific laboratory technician  . Financial resource strain: Not on file  . Food insecurity:    Worry: Not on file    Inability: Not on file  . Transportation needs:    Medical: Not on file    Non-medical: Not on file  Tobacco Use  . Smoking status: Former Smoker    Last attempt to quit: 05/23/1993    Years since quitting: 25.4  . Smokeless tobacco: Never Used  Substance and Sexual Activity  . Alcohol use: Yes    Comment: Rare  . Drug use: No  . Sexual activity: Yes  Lifestyle  . Physical activity:    Days per week: Not on file    Minutes per session: Not on file  . Stress: Not on file  Relationships  . Social connections:    Talks on phone: Not on file    Gets together: Not on file    Attends religious service: Not on file    Active member of club or organization: Not on file    Attends meetings of clubs or organizations: Not on file    Relationship status: Not on file  Other Topics Concern  . 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 aspirin EC 81 MG tablet Take 81 mg by mouth daily.  . Calcium Carbonate-Vitamin D (CALCIUM 600/VITAMIN D) 600-400 MG-UNIT chew tablet Chew 1 tablet by mouth daily.  . ferrous sulfate 325 (65 FE) MG tablet Take 1 tablet (325 mg total) by mouth daily with breakfast.  . leuprolide (LUPRON) 22.5 MG injection Inject 22.5 mg into the muscle every 3 (three) months.  Marland Kitchen lisinopril (ZESTRIL) 5 MG tablet TAKE 1 TABLET EVERY DAY  . methylcellulose oral  powder Take by mouth daily.  Marland Kitchen omeprazole (PRILOSEC) 40 MG capsule TAKE 1 CAPSULE EVERY DAY  . rosuvastatin (CRESTOR) 10 MG tablet TAKE 1 TABLET EVERY DAY  . tamsulosin (FLOMAX) 0.4 MG CAPS capsule Take 0.4 mg by mouth. After supper  . timolol (TIMOPTIC) 0.5 % ophthalmic solution Place 1 drop into the left eye 2 (two) times daily.   Marland Kitchen venlafaxine XR (EFFEXOR-XR) 37.5 MG 24 hr capsule TAKE 1 CAPSULE EVERY DAY WITH BREAKFAST   No facility-administered encounter medications on file as  of 10/31/2018.     Activities of Daily Living In your present state of health, do you have any difficulty performing the following activities: 10/31/2018 03/29/2018  Hearing? N N  Vision? Y N  Comment glaucoma -  Difficulty concentrating or making decisions? N N  Walking or climbing stairs? N N  Dressing or bathing? N N  Doing errands, shopping? N N  Preparing Food and eating ? N -  Using the Toilet? N -  In the past six months, have you accidently leaked urine? Y -  Do you have problems with loss of bowel control? N -  Managing your Medications? N -  Managing your Finances? N -  Housekeeping or managing your Housekeeping? N -  Some recent data might be hidden    Patient Care Team: [name], MD as PCP - General (Family Medicine) [place], Tracie Harrier, MD as Consulting Physician (Ophthalmology) Abbie Sons, MD as Consulting Physician (Urology)   Assessment:   This is a routine wellness examination for Delrae Djibril.  Vision Screening Comments: Vision exam in 2020 with Dr. Drema Dallas  Exercise Activities and Dietary recommendations Current Exercise Habits: Home exercise routine, Type of exercise: walking;Other - see comments(bicycling, stationary bike), Time (Minutes): 30, Frequency (Times/Week): 2, Weekly Exercise (Minutes/Week): 60, Intensity: Mild  Goals    . Patient Stated     Starting 10/31/2018, I will continue to take medications as prescribed.        Fall Risk Fall Risk  10/31/2018  10/11/2017 04/28/2017 08/15/2016 08/14/2015  Falls in the past year? 0 No No No No  Number falls in past yr: - - - - -  Comment - - - - -  Injury with Fall? - - - - -  Comment - - - - -   Depression Screen PHQ 2/9 Scores 10/31/2018 10/11/2017 08/15/2016 08/14/2015  PHQ - 2 Score 0 0 0 0  PHQ- 9 Score 0 0 - -    Cognitive Function MMSE - Mini Mental State Exam 10/31/2018 10/11/2017 08/15/2016 08/14/2015  Orientation to time 5 5 5 5   Orientation to Place 5 5 5 5   Registration 3 3 3 3   Attention/ Calculation 0 0 0 0  Recall 3 1 3 3   Recall-comments - unable to recall 2 of 3 words - -  Language- name 2 objects 0 0 0 0  Language- repeat 1 1 1 1   Language- follow 3 step command 0 3 3 3   Language- read & follow direction 0 0 0 0  Write a sentence 0 0 0 0  Copy design 0 0 0 0  Total score 17 18 20 20      PLEASE NOTE: A Mini-Cog screen was completed. Maximum score is 17. A value of 0 denotes this part of [place] MMSE was not completed or the patient failed this part of the Mini-Cog screening.   Mini-Cog Screening Orientation to Time - Max 5 pts Orientation to Place - Max 5 pts Registration - Max 3 pts Recall - Max 3 pts Language Repeat - Max 1 pts      Immunization History  Administered Date(s) Administered  . H1N1 06/24/2008  . Influenza Split 03/03/2011, 02/29/2012  . Influenza Whole 04/06/2007, 02/19/2008, 03/24/2009, 02/25/2010  . Influenza,inj,Quad PF,6+ Mos 02/26/2013, 02/19/2014, 02/25/2015, 02/12/2016, 02/17/2017, 03/29/2018  . Pneumococcal Conjugate-13 08/09/2013  . Pneumococcal Polysaccharide-23 03/15/2002  . Td 05/26/2006  . Zoster 07/01/2010    Screening Tests Health Maintenance  Topic Date Due  . DTaP/Tdap/Td (1 - Tdap)  05/22/2020 (Originally 11/07/1951)  . TETANUS/TDAP  05/22/2020 (Originally 05/26/2016)  . COLONOSCOPY  05/22/2049 (Originally 09/24/2017)  . INFLUENZA VACCINE  12/22/2018  . PNA vac Low Risk Adult  Completed       Plan:     I have personally  reviewed, addressed, and noted the following in the patient's chart:  A. Medical and social history B. Use of alcohol, tobacco or illicit drugs  C. Current medications and supplements D. Functional ability and status E.  Nutritional status F.  Physical activity G. Advance directives H. List of other physicians I.  Hospitalizations, surgeries, and ER visits in previous 12 months J.  Vitals (unless it is a telemedicine encounter) K. Screenings to include cognitive, depression, hearing, vision (NOTE: hearing and vision screenings not completed in telemedicine encounter) L. Referrals and appointments   In addition, I have reviewed and discussed with patient certain preventive protocols, quality metrics, and best practice recommendations. A written personalized care plan for preventive services and recommendations were provided to patient.  With patient's permission, we connected on 10/31/18 at  8:30 AM EDT. Interactive audio and video telecommunications were attempted with patient. This attempt was unsuccessful due to patient having technical difficulties OR patient did not have access to video capability.  Encounter was completed with audio only.  Two patient identifiers were used to ensure the encounter occurred with the correct person. Patient was in home and writer was in office.   Signed,   Lindell Noe, MHA, BS, LPN Health Coach

## 2018-10-31 NOTE — Patient Instructions (Signed)
Thomas Lopez , Thank you for taking time to come for your Medicare Wellness Visit. I appreciate your ongoing commitment to your health goals. Please review the following plan we discussed and let me know if I can assist you in the future.   These are the goals we discussed: Goals    . Patient Stated     Starting 10/31/2018, I will continue to take medications as prescribed.        This is a list of the screening recommended for you and due dates:  Health Maintenance  Topic Date Due  . DTaP/Tdap/Td vaccine (1 - Tdap) 11/07/1953  . Tetanus Vaccine  05/26/2016  . Colon Cancer Screening  09/24/2017  . Flu Shot  12/22/2018  . Pneumonia vaccines  Completed   Preventive Care for Adults  A healthy lifestyle and preventive care can promote health and wellness. Preventive health guidelines for adults include the following key practices.  . A routine yearly physical is a good way to check with your health care provider about your health and preventive screening. It is a chance to share any concerns and updates on your health and to receive a thorough exam.  . Visit your dentist for a routine exam and preventive care every 6 months. Brush your teeth twice a day and floss once a day. Good oral hygiene prevents tooth decay and gum disease.  . The frequency of eye exams is based on your age, health, family medical history, use  of contact lenses, and other factors. Follow your health care provider's recommendations for frequency of eye exams.  . Eat a healthy diet. Foods like vegetables, fruits, whole grains, low-fat dairy products, and lean protein foods contain the nutrients you need without too many calories. Decrease your intake of foods high in solid fats, added sugars, and salt. Eat the right amount of calories for you. Get information about a proper diet from your health care provider, if necessary.  . Regular physical exercise is one of the most important things you can do for your health.  Most adults should get at least 150 minutes of moderate-intensity exercise (any activity that increases your heart rate and causes you to sweat) each week. In addition, most adults need muscle-strengthening exercises on 2 or more days a week.  Silver Sneakers may be a benefit available to you. To determine eligibility, you may visit the website: www.silversneakers.com or contact program at 562-619-0348 Mon-Fri between 8AM-8PM.   . Maintain a healthy weight. The body mass index (BMI) is a screening tool to identify possible weight problems. It provides an estimate of body fat based on height and weight. Your health care provider can find your BMI and can help you achieve or maintain a healthy weight.   For adults 20 years and older: ? A BMI below 18.5 is considered underweight. ? A BMI of 18.5 to 24.9 is normal. ? A BMI of 25 to 29.9 is considered overweight. ? A BMI of 30 and above is considered obese.   . Maintain normal blood lipids and cholesterol levels by exercising and minimizing your intake of saturated fat. Eat a balanced diet with plenty of fruit and vegetables. Blood tests for lipids and cholesterol should begin at age 74 and be repeated every 5 years. If your lipid or cholesterol levels are high, you are over 50, or you are at high risk for heart disease, you may need your cholesterol levels checked more frequently. Ongoing high lipid and cholesterol levels should be treated  with medicines if diet and exercise are not working.  . If you smoke, find out from your health care provider how to quit. If you do not use tobacco, please do not start.  . If you choose to drink alcohol, please do not consume more than 2 drinks per day. One drink is considered to be 12 ounces (355 mL) of beer, 5 ounces (148 mL) of wine, or 1.5 ounces (44 mL) of liquor.  . If you are 43-65 years old, ask your health care provider if you should take aspirin to prevent strokes.  . Use sunscreen. Apply sunscreen  liberally and repeatedly throughout the day. You should seek shade when your shadow is shorter than you. Protect yourself by wearing long sleeves, pants, a wide-brimmed hat, and sunglasses year round, whenever you are outdoors.  . Once a month, do a whole body skin exam, using a mirror to look at the skin on your back. Tell your health care provider of new moles, moles that have irregular borders, moles that are larger than a pencil eraser, or moles that have changed in shape or color.

## 2018-11-01 NOTE — Progress Notes (Signed)
   Subjective:    Patient ID: Thomas Lopez, male    DOB: 1932-08-20, 85 y.o.   MRN: [id number]  HPI  I reviewed health advisor's note, was available for consultation, and agree with documentation and plan.   Review of Systems     Objective:   Physical Exam         Assessment & Plan:

## 2018-11-02 LAB — PROTEIN ELECTROPHORESIS, URINE REFLEX
Albumin ELP, Urine: 45.7 %
Alpha-1-Globulin, U: 3.5 %
Alpha-2-Globulin, U: 9.1 %
Beta Globulin, U: 25.8 %
Gamma Globulin, U: 15.9 %
Protein, Ur: 42.4 mg/dL

## 2018-11-07 ENCOUNTER — Inpatient Hospital Stay: Payer: Medicare PPO

## 2018-11-07 ENCOUNTER — Inpatient Hospital Stay: Payer: Medicare PPO | Attending: Internal Medicine

## 2018-11-07 ENCOUNTER — Encounter: Payer: Self-pay | Admitting: Family Medicine

## 2018-11-07 ENCOUNTER — Other Ambulatory Visit: Payer: Self-pay

## 2018-11-07 ENCOUNTER — Ambulatory Visit (INDEPENDENT_AMBULATORY_CARE_PROVIDER_SITE_OTHER): Payer: Medicare PPO | Admitting: Family Medicine

## 2018-11-07 VITALS — BP 134/60 | HR 62 | Ht 69.0 in | Wt 148.0 lb

## 2018-11-07 DIAGNOSIS — I700 Atherosclerosis of aorta: Secondary | ICD-10-CM | POA: Diagnosis not present

## 2018-11-07 DIAGNOSIS — N183 Chronic kidney disease, stage 3 unspecified: Secondary | ICD-10-CM

## 2018-11-07 DIAGNOSIS — Z7189 Other specified counseling: Secondary | ICD-10-CM

## 2018-11-07 DIAGNOSIS — I708 Atherosclerosis of other arteries: Secondary | ICD-10-CM

## 2018-11-07 DIAGNOSIS — R339 Retention of urine, unspecified: Secondary | ICD-10-CM

## 2018-11-07 DIAGNOSIS — D649 Anemia, unspecified: Secondary | ICD-10-CM | POA: Diagnosis not present

## 2018-11-07 DIAGNOSIS — S4991XS Unspecified injury of right shoulder and upper arm, sequela: Secondary | ICD-10-CM | POA: Diagnosis not present

## 2018-11-07 DIAGNOSIS — J439 Emphysema, unspecified: Secondary | ICD-10-CM | POA: Diagnosis not present

## 2018-11-07 DIAGNOSIS — R7303 Prediabetes: Secondary | ICD-10-CM

## 2018-11-07 DIAGNOSIS — E782 Mixed hyperlipidemia: Secondary | ICD-10-CM

## 2018-11-07 DIAGNOSIS — H401133 Primary open-angle glaucoma, bilateral, severe stage: Secondary | ICD-10-CM

## 2018-11-07 DIAGNOSIS — R634 Abnormal weight loss: Secondary | ICD-10-CM | POA: Diagnosis not present

## 2018-11-07 DIAGNOSIS — I10 Essential (primary) hypertension: Secondary | ICD-10-CM

## 2018-11-07 DIAGNOSIS — C61 Malignant neoplasm of prostate: Secondary | ICD-10-CM

## 2018-11-07 NOTE — Assessment & Plan Note (Addendum)
Advanced directives - has completed living will with attorney at home. Has youngest daughter Mardene Celeste, designated as proxy for health decisions in case he cannot make them. Received note from [facility] stating MOST and DNR forms filled out 05/2018. Asked to bring [place] a copy.

## 2018-11-07 NOTE — Assessment & Plan Note (Signed)
Regularly followed by ophthalmology.

## 2018-11-07 NOTE — Assessment & Plan Note (Signed)
Stable period s/p urolift

## 2018-11-07 NOTE — Assessment & Plan Note (Signed)
Chronic, stable on low dose lisinopril - continue.  

## 2018-11-07 NOTE — Assessment & Plan Note (Signed)
This seems to have stabilized. Nadir weight seems to have been 135 lbs.

## 2018-11-07 NOTE — Assessment & Plan Note (Signed)
Update renal panel when he returns for lab visit.

## 2018-11-07 NOTE — Assessment & Plan Note (Signed)
Return of R shoulder pain in h/o complete R RTC tear 2017. Advised start with tylenol 500mg  twice daily with meals. If no better, let me know for referral to ortho or PT.

## 2018-11-07 NOTE — Assessment & Plan Note (Signed)
Chronic, stable on crestor. Update FLP when he returns for fasting labs.

## 2018-11-07 NOTE — Assessment & Plan Note (Signed)
Asymptomatic, not on respiratory medication.

## 2018-11-07 NOTE — Assessment & Plan Note (Addendum)
Biochemical recurrence on lupron (ADT) sees heme/uro

## 2018-11-07 NOTE — Assessment & Plan Note (Addendum)
Continue aspirin, statin. I will ask patient to return for cholesterol check.

## 2018-11-07 NOTE — Assessment & Plan Note (Signed)
Update A1c ?

## 2018-11-07 NOTE — Progress Notes (Signed)
Thomas Lopez - 85 y.o. male  MRN [id number]  Date of Birth: 05-Sep-1932  PCP: [name], MD  This service was provided via telemedicine. Phone Visit performed on 11/07/2018, patient did not have video access.   Rationale for phone visit along with limitations reviewed. Patient consented to telephone encounter.    Location of patient: home Location of provider: in office, [place] @ [facility] Name of referring provider: N/A   Names of persons and role in encounter: Provider: [name], MD  Patient: Thomas Lopez  Other: N/A   Time on call: 3:54pm - 4:06pm    Subjective: Chief Complaint  Patient presents with  . Annual Exam    Pt 2.      HPI:  Thomas Lopez last week for medicare wellness visit. Note reviewed.    Regularly followed by heme Rogue Bussing) for normocytic anemia likely of chronic disease s/p bone marrow biopsy receiving IV venofer maintenance infusions and considering retacrit if Hgb <10, also sees urology Bernardo Heater) for prostate cancer s/p radiation therapy and radiation seed implant 2005, with biochemical recurrence started on firmagon and now on lupron. Also had BPH with outflow obstruction s/p urolift 03/2018. Weight overall stable.   Also sees Dr Edilia Bo for glaucoma.   Saw rheum with mildly positive anti-CCP Meda Coffee), unclear if he ever followed up.   Over the last month notices increasing R shoulder pain throughout anterior and superior shoulder into R neck. Denies inciting trauma/injury. Hasn't been using any medication for this. Suffered complete R RTC tear - saw Dr Roland Rack - that improved with conservative measures.   He manages meds on his own.   Preventative: Prostate cancer 2005 s/p recurrence 2015 - see above H/o colon polyps, last colonoscopy 09/2012 by Dr. [name] - tortuous colon, unable to complete. Was told would have virtual colonoscopy scheduled but was never contacted. Advised him to call GI to f/u 2016. Normal yearly iFOBs in  interim, last 11/2016. Dark stools started recently - but he has also been taking oral iron supplementation.  Flu shotyearly Pneumovax 2003 (done after age 23).Prevnar 2015.  Td 2008  zostavax 06/2010 Shingrix - deferred Advanced directives - has completed living will with attorney at home. Has youngest daughter Mardene Celeste, designated as proxy for health decisions in case he cannot make them. Received note from [facility] stating MOST and DNR forms filled out 05/2018. Asked to bring [place] a copy.  Seat Lopez use discussed Sunscreen use discussed. No changing moles on skin.  Ex smoker quit 1995 Alcohol - none  Dentist - yearly Eye exam - recent cataract surgery ([facility]) - also regularly followed for glaucoma.   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   BP 134/60   Pulse 62   Ht 5' 9"  (1.753 m)   Wt 148 lb (67.1 kg)   BMI 21.86 kg/m   Wt Readings from Last 3 Encounters:  11/07/18 148 lb (67.1 kg)  07/23/18 152 lb 9.6 oz (69.2 kg)  05/28/18 148 lb 12.8 oz (67.5 kg)     Respiratory status: speaks in complete sentences without evident shortness of breath.   Assessment/Plan: I asked pt to return for fasting labs at his convenience and 6 month follow up visit.   Right shoulder injury Return of R shoulder pain in  h/o complete R RTC tear 2017. Advised start with tylenol 526m twice daily with meals. If no better, let me know for referral to ortho or PT.   Advanced care planning/counseling discussion Advanced directives - has completed living will with attorney at home. Has youngest daughter PMardene Celeste designated as proxy for health decisions in case he cannot make them. [facility] Asked to [place].   Abnormal weight loss This seems to have stabilized. Nadir weight seems to have been 135 lbs.   Aorto-iliac atherosclerosis (HCC) Continue aspirin, statin. I will ask patient to return for cholesterol check.  CKD (chronic kidney disease) stage 3, GFR 30-59 ml/min (HCC) Update renal panel when he returns for lab visit.   Emphysema of lung ([place] Asymptomatic, not on respiratory medication.   Essential hypertension Chronic, stable on low dose lisinopril - continue.  Mixed hyperlipidemia Chronic, stable on crestor. Update FLP when he returns for fasting labs.   Prediabetes Update A1c.   Primary adenocarcinoma of prostate ([place] Biochemical recurrence on lupron (ADT) sees heme/uro  Primary open angle glaucoma Regularly followed by ophthalmology.   Urinary retention Stable period s/p urolift    I discussed the assessment and treatment plan with the patient. The patient was provided an opportunity to ask questions and all were answered. The patient agreed with the plan and demonstrated an understanding of the instructions.   Lab Orders     Lipid panel     Hemoglobin A1c     VITAMIN D 25 Hydroxy (Vit-D Deficiency, Fractures)     Renal function panel  No orders of the defined types were placed in this encounter.   The patient was advised to call back or seek an in-person evaluation if the symptoms worsen or if the condition fails to improve as anticipated.  JRia Bush MD

## 2018-11-13 ENCOUNTER — Other Ambulatory Visit: Payer: Self-pay

## 2018-11-13 ENCOUNTER — Other Ambulatory Visit (INDEPENDENT_AMBULATORY_CARE_PROVIDER_SITE_OTHER): Payer: Medicare PPO

## 2018-11-13 DIAGNOSIS — N183 Chronic kidney disease, stage 3 unspecified: Secondary | ICD-10-CM

## 2018-11-13 DIAGNOSIS — R7303 Prediabetes: Secondary | ICD-10-CM | POA: Diagnosis not present

## 2018-11-13 DIAGNOSIS — E782 Mixed hyperlipidemia: Secondary | ICD-10-CM | POA: Diagnosis not present

## 2018-11-13 LAB — RENAL FUNCTION PANEL
Albumin: 4.1 g/dL (ref 3.5–5.2)
BUN: 52 mg/dL — ABNORMAL HIGH (ref 6–23)
CO2: 22 mEq/L (ref 19–32)
Calcium: 9.7 mg/dL (ref 8.4–10.5)
Chloride: 106 mEq/L (ref 96–112)
Creatinine, Ser: 1.59 mg/dL — ABNORMAL HIGH (ref 0.40–1.50)
GFR: 50.44 mL/min — ABNORMAL LOW (ref >60.00)
Glucose, Bld: 101 mg/dL — ABNORMAL HIGH (ref 70–99)
Phosphorus: 3.7 mg/dL (ref 2.3–4.6)
Potassium: 4.7 mEq/L (ref 3.5–5.1)
Sodium: 138 mEq/L (ref 135–145)

## 2018-11-13 LAB — LIPID PANEL
Cholesterol: 151 mg/dL (ref 0–200)
HDL: 41.6 mg/dL (ref >39.00)
LDL Cholesterol: 79 mg/dL (ref 0–99)
NonHDL: 109.44
Total CHOL/HDL Ratio: 4
Triglycerides: 151 mg/dL — ABNORMAL HIGH (ref 0.0–149.0)
VLDL: 30.2 mg/dL (ref 0.0–40.0)

## 2018-11-13 LAB — VITAMIN D 25 HYDROXY (VIT D DEFICIENCY, FRACTURES): VITD: 36.53 ng/mL (ref 30.00–100.00)

## 2018-11-13 LAB — HEMOGLOBIN A1C: Hgb A1c MFr Bld: 6.4 % (ref 4.6–6.5)

## 2018-11-14 ENCOUNTER — Ambulatory Visit: Payer: Medicare PPO | Admitting: Urology

## 2018-11-16 ENCOUNTER — Other Ambulatory Visit: Payer: Self-pay | Admitting: Family Medicine

## 2018-12-10 DIAGNOSIS — H401133 Primary open-angle glaucoma, bilateral, severe stage: Secondary | ICD-10-CM | POA: Diagnosis not present

## 2018-12-12 ENCOUNTER — Inpatient Hospital Stay: Payer: Medicare PPO | Admitting: Internal Medicine

## 2018-12-12 ENCOUNTER — Inpatient Hospital Stay: Payer: Medicare PPO

## 2018-12-13 ENCOUNTER — Inpatient Hospital Stay: Payer: Medicare PPO

## 2018-12-18 ENCOUNTER — Other Ambulatory Visit: Payer: Self-pay

## 2018-12-18 ENCOUNTER — Ambulatory Visit (INDEPENDENT_AMBULATORY_CARE_PROVIDER_SITE_OTHER): Payer: Medicare PPO | Admitting: Urology

## 2018-12-18 ENCOUNTER — Encounter: Payer: Self-pay | Admitting: Urology

## 2018-12-18 VITALS — BP 112/53 | HR 62 | Ht 69.0 in | Wt 144.7 lb

## 2018-12-18 DIAGNOSIS — N401 Enlarged prostate with lower urinary tract symptoms: Secondary | ICD-10-CM

## 2018-12-18 DIAGNOSIS — R351 Nocturia: Secondary | ICD-10-CM | POA: Diagnosis not present

## 2018-12-18 MED ORDER — TROSPIUM CHLORIDE 20 MG PO TABS: 20.0000 mg | ORAL_TABLET | Freq: Every day | ORAL | 0 refills | Status: DC

## 2018-12-18 NOTE — Addendum Note (Signed)
Addended by: Abbie Sons on: 12/18/2018 09:26 PM   Modules accepted: Orders

## 2018-12-18 NOTE — Progress Notes (Signed)
12/18/2018 8:52 PM   Thomas Lopez May 17, 1933 [id number]  Referring provider: [name], MD [address] [place],  [place] [address]  Chief Complaint  Patient presents with  . Follow-up    HPI: 85 y.o. male presents for follow-up of BPH.  He had a history of incomplete bladder emptying and bilateral hydronephrosis.  His hydronephrosis did not change after a trial of Foley catheter drainage.  Lasix renal scan showed no evidence of obstruction.  He was symptomatic with complaints of urinary hesitancy, decreased force and caliber of his urinary stream.  Video urine image study was consistent with obstruction.  After discussing management options he elected UroLift which was performed on 04/10/2018.  At his initial postoperative follow-up PVR was 20 mL and he was voiding with a good stream and felt he was emptying his bladder to completion.  He states he was doing well until 4 weeks ago when he began to have nocturia every 1-2 hours.  He estimates his voided volumes at night is small.  He denies daytime lower urinary tract symptoms and states he voids with a good stream.  He has been restricting his nighttime fluids.  Denies sleep apnea or history of snoring.   PMH: Past Medical History:  Diagnosis Date  . Aorto-iliac atherosclerosis ([place]) 04/2015   by CT scan  . Carotid stenosis 11/2014   mild bilateral 1-39%, rpt 2 yrs  . Colon polyps    rpt colonoscopy due 2014  . ED (erectile dysfunction)    Trimix and VED  . Esophagitis   . Essential hypertension 06/29/2009  . GERD (gastroesophageal reflux disease)   . Glaucoma    severe ([facility])  . HLD (hyperlipidemia)   . HTN (hypertension)   . Hyperglycemia   . IBS (irritable bowel syndrome)   . Prostate cancer [facility]) 2005   s/p seed implant, EBRT (Dr. Alinda [facility] at [facility]) recurrent treating with androgen deprivation referred to cancer center  . Smoking history     Surgical History: Past Surgical History:  Procedure  Laterality Date  . CATARACT EXTRACTION Left 05/2017   Dr. Edilia Bo  . CATARACT EXTRACTION, BILATERAL  December 2016   [place], Dr. Raynelle Fanning  . COLONOSCOPY  03/01/01   Multiple polyps, divertic//adenomatous polyps  . COLONOSCOPY  04/24/01   multiple polyps  . COLONOSCOPY  10/23/01   Polyps benign-repeat every 2 years  . COLONOSCOPY  06/23/04   Adenom/hyperplastic polyps; multiple external hemorrhoids  . COLONOSCOPY  06/08/07   single small polyp-repeat 5 years  . CYSTOSCOPY WITH INSERTION OF UROLIFT N/A 04/10/2018   Procedure: CYSTOSCOPY WITH INSERTION OF UROLIFT;  Surgeon: Abbie Sons, MD;  Location: [facility] ORS;  Service: Urology;  Laterality: N/A;  . ESOPHAGOGASTRODUODENOSCOPY  03/01/01   H.H.; gastritis esoph. dudodenitis  . NCS/Wrists Left Carpal  2/02   Min./right improved  . PFTs  10/2012   FVC 64%, FEV1 41%, ratio 0.62  . PROSTATE BIOPSY  12/04   positive; radioactive seed implant (Dr. Joelyn Oms)  . Prostate Ext Beam Radiation  1/27-07/23/03   Dr. Danny Lawless  . SPIROMETRY  09/2011   WNL  . TONSILLECTOMY    . Wrist Release  6/98; 9/99   Right    Home Medications:  Allergies as of 12/18/2018   No Known Allergies     Medication List       Accurate as of Lopez 28, 2020  8:52 PM. If you have any questions, ask your nurse or doctor.  aspirin EC 81 MG tablet Take 81 mg by mouth daily.   Calcium Carbonate-Vitamin D 600-400 MG-UNIT chew tablet Commonly known as: Calcium 600/Vitamin D Chew 1 tablet by mouth daily.   ferrous sulfate 325 (65 FE) MG tablet Take 1 tablet (325 mg total) by mouth daily with breakfast.   leuprolide 22.5 MG injection Commonly known as: LUPRON Inject 22.5 mg into the muscle every 3 (three) months.   lisinopril 5 MG tablet Commonly known as: ZESTRIL TAKE 1 TABLET EVERY DAY   Lumigan 0.01 % Soln Generic drug: bimatoprost   methylcellulose oral powder Take by mouth daily.   omeprazole 40 MG capsule Commonly known as:  PRILOSEC TAKE 1 CAPSULE EVERY DAY   rosuvastatin 10 MG tablet Commonly known as: CRESTOR TAKE 1 TABLET EVERY DAY   tamsulosin 0.4 MG Caps capsule Commonly known as: FLOMAX Take 0.4 mg by mouth. After supper   timolol 0.5 % ophthalmic solution Commonly known as: TIMOPTIC Place 1 drop into the left eye 2 (two) times daily.   venlafaxine XR 37.5 MG 24 hr capsule Commonly known as: EFFEXOR-XR TAKE 1 CAPSULE EVERY DAY WITH BREAKFAST       Allergies: No Known Allergies  Family History: Family History  Problem Relation Age of Onset  . Cancer Mother        colon  . Hyperlipidemia Mother   . Other Mother        Carotid disease  . Coronary artery disease Neg Hx   . Stroke Neg Hx     Social History:  reports that he quit smoking about 25 years ago. He has never used smokeless tobacco. He reports current alcohol use. He reports that he does not use drugs.  ROS: UROLOGY Frequent Urination?: Yes Hard to postpone urination?: Yes Burning/pain with urination?: No Get up at night to urinate?: Yes Leakage of urine?: No Urine stream starts and stops?: No Trouble starting stream?: No Do you have to strain to urinate?: No Blood in urine?: No Urinary tract infection?: No Sexually transmitted disease?: No Injury to kidneys or bladder?: No Painful intercourse?: No Weak stream?: No Erection problems?: Yes Penile pain?: No  Gastrointestinal Nausea?: No Vomiting?: No Indigestion/heartburn?: No Diarrhea?: No Constipation?: No  Constitutional Fever: No Night sweats?: No Weight loss?: Yes Fatigue?: No  Skin Skin rash/lesions?: No Itching?: No  Eyes Blurred vision?: No Double vision?: No  Ears/Nose/Throat Sore throat?: No Sinus problems?: No  Hematologic/Lymphatic Swollen glands?: No Easy bruising?: No  Cardiovascular Leg swelling?: No Chest pain?: No  Respiratory Cough?: No Shortness of breath?: No  Endocrine Excessive thirst?: No  Musculoskeletal  Back pain?: No Joint pain?: No  Neurological Headaches?: No Dizziness?: No  Psychologic Depression?: No Anxiety?: No  Physical Exam: BP (!) 112/53 (BP Location: Left Arm, Patient Position: Sitting, Cuff Size: Normal)   Pulse 62   Ht 5\' 9"  (1.753 m)   Wt 144 lb 11.2 oz (65.6 kg)   BMI 21.37 kg/m   Constitutional:  Alert and oriented, No acute distress. HEENT: [place] AT, moist mucus membranes.  Trachea midline, no masses. Cardiovascular: No clubbing, cyanosis, or edema. Respiratory: Normal respiratory effort, no increased work of breathing. GI: Abdomen is soft, nontender, nondistended, no abdominal masses GU: No CVA tenderness Neurologic: Grossly intact, no focal deficits, moving all 4 extremities. Psychiatric: Normal mood and affect.   Assessment & Plan:   85 year old male doing well status post UroLift however recently developed nocturia.  He has no daytime complaints.  Will give a trial  of trospium 20 mg every hour.  He will call back regarding efficacy and was instructed to call earlier should his symptoms worsen.   Abbie Sons, [place] [address], [place], [place] [address] [phone]

## 2018-12-19 ENCOUNTER — Telehealth: Payer: Self-pay | Admitting: *Deleted

## 2018-12-19 NOTE — Telephone Encounter (Signed)
I would not recommend oxybutynin ER.  He needs a short acting medication.  We can give a trial of oxybutynin IR if insurance covers that.  He can get 30 tablets of trospium with a good Rx coupon at [facility] for $15.

## 2018-12-19 NOTE — Telephone Encounter (Signed)
Received fax from Pharmacy patient's plan does not cover Trospium 20 Mg will cover Oxybutynin ER. Please advise strength?

## 2018-12-20 MED ORDER — TROSPIUM CHLORIDE 20 MG PO TABS: 20.0000 mg | ORAL_TABLET | Freq: Every day | ORAL | 0 refills | Status: DC

## 2018-12-20 NOTE — Telephone Encounter (Signed)
Informed patient, mailed GoodRx coupon-RX sent to [facility]. Verbalized understanding.

## 2018-12-25 ENCOUNTER — Other Ambulatory Visit: Payer: Self-pay

## 2018-12-25 ENCOUNTER — Inpatient Hospital Stay: Payer: Medicare PPO | Attending: Internal Medicine

## 2018-12-25 DIAGNOSIS — N189 Chronic kidney disease, unspecified: Secondary | ICD-10-CM

## 2018-12-25 DIAGNOSIS — Z87891 Personal history of nicotine dependence: Secondary | ICD-10-CM | POA: Diagnosis not present

## 2018-12-25 DIAGNOSIS — N183 Chronic kidney disease, stage 3 unspecified: Secondary | ICD-10-CM

## 2018-12-25 DIAGNOSIS — Z7982 Long term (current) use of aspirin: Secondary | ICD-10-CM | POA: Diagnosis not present

## 2018-12-25 DIAGNOSIS — R5382 Chronic fatigue, unspecified: Secondary | ICD-10-CM | POA: Insufficient documentation

## 2018-12-25 DIAGNOSIS — Z923 Personal history of irradiation: Secondary | ICD-10-CM | POA: Diagnosis not present

## 2018-12-25 DIAGNOSIS — R5381 Other malaise: Secondary | ICD-10-CM | POA: Insufficient documentation

## 2018-12-25 DIAGNOSIS — C61 Malignant neoplasm of prostate: Secondary | ICD-10-CM | POA: Diagnosis not present

## 2018-12-25 DIAGNOSIS — M549 Dorsalgia, unspecified: Secondary | ICD-10-CM | POA: Diagnosis not present

## 2018-12-25 DIAGNOSIS — G8929 Other chronic pain: Secondary | ICD-10-CM | POA: Insufficient documentation

## 2018-12-25 DIAGNOSIS — I129 Hypertensive chronic kidney disease with stage 1 through stage 4 chronic kidney disease, or unspecified chronic kidney disease: Secondary | ICD-10-CM | POA: Insufficient documentation

## 2018-12-25 DIAGNOSIS — D631 Anemia in chronic kidney disease: Secondary | ICD-10-CM | POA: Diagnosis not present

## 2018-12-25 DIAGNOSIS — Z79899 Other long term (current) drug therapy: Secondary | ICD-10-CM | POA: Diagnosis not present

## 2018-12-25 DIAGNOSIS — M25511 Pain in right shoulder: Secondary | ICD-10-CM | POA: Diagnosis not present

## 2018-12-25 DIAGNOSIS — Z79818 Long term (current) use of other agents affecting estrogen receptors and estrogen levels: Secondary | ICD-10-CM | POA: Insufficient documentation

## 2018-12-25 LAB — CBC WITH DIFFERENTIAL/PLATELET
Abs Immature Granulocytes: 0.01 10*3/uL (ref 0.00–0.07)
Basophils Absolute: 0 10*3/uL (ref 0.0–0.1)
Basophils Relative: 1 %
Eosinophils Absolute: 0.1 10*3/uL (ref 0.0–0.5)
Eosinophils Relative: 2 %
HCT: 25.8 % — ABNORMAL LOW (ref 39.0–52.0)
Hemoglobin: 7.7 g/dL — ABNORMAL LOW (ref 13.0–17.0)
Immature Granulocytes: 0 %
Lymphocytes Relative: 17 %
Lymphs Abs: 0.9 10*3/uL (ref 0.7–4.0)
MCH: 26.8 pg (ref 26.0–34.0)
MCHC: 29.8 g/dL — ABNORMAL LOW (ref 30.0–36.0)
MCV: 89.9 fL (ref 80.0–100.0)
Monocytes Absolute: 0.5 10*3/uL (ref 0.1–1.0)
Monocytes Relative: 8 %
Neutro Abs: 4 10*3/uL (ref 1.7–7.7)
Neutrophils Relative %: 72 %
Platelets: 283 10*3/uL (ref 150–400)
RBC: 2.87 MIL/uL — ABNORMAL LOW (ref 4.22–5.81)
RDW: 17 % — ABNORMAL HIGH (ref 11.5–15.5)
WBC: 5.6 10*3/uL (ref 4.0–10.5)
nRBC: 0 % (ref 0.0–0.2)

## 2018-12-25 LAB — BASIC METABOLIC PANEL
Anion gap: 8 (ref 5–15)
BUN: 35 mg/dL — ABNORMAL HIGH (ref 8–23)
CO2: 24 mmol/L (ref 22–32)
Calcium: 9.2 mg/dL (ref 8.9–10.3)
Chloride: 108 mmol/L (ref 98–111)
Creatinine, Ser: 1.47 mg/dL — ABNORMAL HIGH (ref 0.61–1.24)
GFR calc Af Amer: 50 mL/min — ABNORMAL LOW (ref >60)
GFR calc non Af Amer: 43 mL/min — ABNORMAL LOW (ref >60)
Glucose, Bld: 116 mg/dL — ABNORMAL HIGH (ref 70–99)
Potassium: 4 mmol/L (ref 3.5–5.1)
Sodium: 140 mmol/L (ref 135–145)

## 2018-12-26 ENCOUNTER — Ambulatory Visit: Payer: Medicare PPO

## 2018-12-26 ENCOUNTER — Ambulatory Visit: Payer: Medicare PPO | Admitting: Internal Medicine

## 2018-12-27 ENCOUNTER — Other Ambulatory Visit: Payer: Self-pay

## 2018-12-27 ENCOUNTER — Other Ambulatory Visit: Payer: Self-pay | Admitting: Internal Medicine

## 2018-12-28 ENCOUNTER — Inpatient Hospital Stay: Payer: Medicare PPO | Admitting: Internal Medicine

## 2018-12-28 ENCOUNTER — Other Ambulatory Visit: Payer: Self-pay

## 2018-12-28 ENCOUNTER — Inpatient Hospital Stay: Payer: Medicare PPO

## 2018-12-28 VITALS — BP 93/60 | HR 62 | Temp 97.8°F | Resp 20

## 2018-12-28 DIAGNOSIS — C61 Malignant neoplasm of prostate: Secondary | ICD-10-CM | POA: Diagnosis not present

## 2018-12-28 DIAGNOSIS — D631 Anemia in chronic kidney disease: Secondary | ICD-10-CM | POA: Diagnosis not present

## 2018-12-28 DIAGNOSIS — N183 Chronic kidney disease, stage 3 (moderate): Secondary | ICD-10-CM | POA: Diagnosis not present

## 2018-12-28 DIAGNOSIS — I129 Hypertensive chronic kidney disease with stage 1 through stage 4 chronic kidney disease, or unspecified chronic kidney disease: Secondary | ICD-10-CM | POA: Diagnosis not present

## 2018-12-28 DIAGNOSIS — R5381 Other malaise: Secondary | ICD-10-CM | POA: Diagnosis not present

## 2018-12-28 DIAGNOSIS — Z79818 Long term (current) use of other agents affecting estrogen receptors and estrogen levels: Secondary | ICD-10-CM | POA: Diagnosis not present

## 2018-12-28 DIAGNOSIS — Z923 Personal history of irradiation: Secondary | ICD-10-CM | POA: Diagnosis not present

## 2018-12-28 DIAGNOSIS — R5382 Chronic fatigue, unspecified: Secondary | ICD-10-CM | POA: Diagnosis not present

## 2018-12-28 DIAGNOSIS — M25511 Pain in right shoulder: Secondary | ICD-10-CM | POA: Diagnosis not present

## 2018-12-28 MED ORDER — EPOETIN ALFA-EPBX 10000 UNIT/ML IJ SOLN
20000.0000 [IU] | Freq: Once | INTRAMUSCULAR | Status: AC
  Administered 2018-12-28: 20000 [IU] via SUBCUTANEOUS
  Filled 2018-12-28: qty 2

## 2018-12-28 NOTE — Progress Notes (Deleted)
[facility] OFFICE PROGRESS NOTE  Patient Care Team: [name], MD as PCP - General (Family Medicine) Bond, Tracie Harrier, MD as Consulting Physician (Ophthalmology) Abbie Sons, MD as Consulting Physician (Urology)  Cancer Staging No matching staging information was found for the patient.   Oncology History Overview Note  # external beam radiation therapy and radiation seed implant in 2005 with Dr. Joelyn Oms and Dr. Danny Lawless for T1c Gleason 3+4 = 7 prostate cancer with a pretreatment PSA of 6.8. In November 2015, he was found to have biochemical recurrence.  Dec 2016- CT a/p & Bone scan- NEG; PSA- 11; June 2017- 19; STARTED on Firmagon [Dr.Budzyn]; on Lupron q 48M  # OCT 2018- Anemia-? Etiology; Likely anemia of chronic disease [anti-CCP elevated/ but NO RA; rheumatology; ]BMBx- hypercellular 30%; relative myeloid hyperplasia/erythroid hypoplasia; FISH- 5/7/8-Normal. II OPINION at [facility]. [facility]- NEG  # CKD- III; Bladder neck obstruction s/p Foley [Dr.Stoiff]  # JAN 2018- Dexa scan- N ------------------------------------   DIAGNOSIS: PROSTATE CANCER  STAGE:  IV       ;GOALS: control  CURRENT/MOST RECENT THERAPY: Lupron    Primary adenocarcinoma of prostate ([place])      INTERVAL HISTORY:  Thomas Lopez 85 y.o.  male pleasant patient above history of anemia of unclear etiology is here for follow-up.  In the interim patient denies any blood in stools or black or stools.  States that he is passing urine without any major complaints.  He does complain of mild to moderate fatigue.  Mild shortness of breath on exertion.  He does complain of stiffness and pain in his joints of his left hand.  This is been happening over the last 3 weeks.  Review of Systems  Constitutional: Positive for malaise/fatigue. Negative for chills, diaphoresis and fever.  HENT: Negative for nosebleeds and sore throat.   Eyes: Negative for double vision.  Respiratory:  Positive for shortness of breath. Negative for cough, hemoptysis, sputum production and wheezing.   Cardiovascular: Negative for chest pain, palpitations, orthopnea and leg swelling.  Gastrointestinal: Negative for abdominal pain, blood in stool, constipation, diarrhea, heartburn, melena, nausea and vomiting.  Genitourinary: Negative for dysuria, frequency and urgency.  Musculoskeletal: Positive for joint pain. Negative for back pain.  Skin: Negative.  Negative for itching and rash.  Neurological: Negative for dizziness, tingling, focal weakness, weakness and headaches.  Endo/Heme/Allergies: Does not bruise/bleed easily.  Psychiatric/Behavioral: Negative for depression. The patient is not nervous/anxious and does not have insomnia.       PAST MEDICAL HISTORY :  Past Medical History:  Diagnosis Date  . Aorto-iliac atherosclerosis ([place]) 04/2015   by CT scan  . Carotid stenosis 11/2014   mild bilateral 1-39%, rpt 2 yrs  . Colon polyps    rpt colonoscopy due 2014  . ED (erectile dysfunction)    Trimix and VED  . Esophagitis   . Essential hypertension 06/29/2009  . GERD (gastroesophageal reflux disease)   . Glaucoma    severe ([place])  . HLD (hyperlipidemia)   . HTN (hypertension)   . Hyperglycemia   . IBS (irritable bowel syndrome)   . Prostate cancer [facility]) 2005   s/p seed implant, EBRT ([facility]. Alinda [facility] at [facility]) recurrent treating with androgen deprivation referred to cancer center  . Smoking history     PAST SURGICAL HISTORY :   Past Surgical History:  Procedure Laterality Date  . CATARACT EXTRACTION Left 05/2017   Dr. Edilia Bo  . CATARACT EXTRACTION, BILATERAL  December 2016  [place], Dr. Raynelle Fanning  . COLONOSCOPY  03/01/01   Multiple polyps, divertic//adenomatous polyps  . COLONOSCOPY  04/24/01   multiple polyps  . COLONOSCOPY  10/23/01   Polyps benign-repeat every 2 years  . COLONOSCOPY  06/23/04   Adenom/hyperplastic polyps; multiple external hemorrhoids  .  COLONOSCOPY  06/08/07   single small polyp-repeat 5 years  . CYSTOSCOPY WITH INSERTION OF UROLIFT N/A 04/10/2018   Procedure: CYSTOSCOPY WITH INSERTION OF UROLIFT;  Surgeon: Abbie Sons, MD;  Location: [facility] ORS;  Service: Urology;  Laterality: N/A;  . ESOPHAGOGASTRODUODENOSCOPY  03/01/01   H.H.; gastritis esoph. dudodenitis  . NCS/Wrists Left Carpal  2/02   Min./right improved  . PFTs  10/2012   FVC 64%, FEV1 41%, ratio 0.62  . PROSTATE BIOPSY  12/04   positive; radioactive seed implant (Dr. Joelyn Oms)  . Prostate Ext Beam Radiation  1/27-07/23/03   Dr. Danny Lawless  . SPIROMETRY  09/2011   WNL  . TONSILLECTOMY    . Wrist Release  6/98; 9/99   Right    FAMILY HISTORY :   Family History  Problem Relation Age of Onset  . Cancer Mother        colon  . Hyperlipidemia Mother   . Other Mother        Carotid disease  . Coronary artery disease Neg Hx   . Stroke Neg Hx     SOCIAL HISTORY:   Social History   Tobacco Use  . Smoking status: Former Smoker    Quit date: 05/23/1993    Years since quitting: 25.6  . Smokeless tobacco: Never Used  Substance Use Topics  . Alcohol use: Yes    Comment: Rare  . Drug use: No    ALLERGIES:  has No Known Allergies.  MEDICATIONS:  Current Outpatient Medications  Medication Sig Dispense Refill  . aspirin EC 81 MG tablet Take 81 mg by mouth daily.    . Calcium Carbonate-Vitamin D (CALCIUM 600/VITAMIN D) 600-400 MG-UNIT chew tablet Chew 1 tablet by mouth daily.    . ferrous sulfate 325 (65 FE) MG tablet Take 1 tablet (325 mg total) by mouth daily with breakfast.  3  . leuprolide (LUPRON) 22.5 MG injection Inject 22.5 mg into the muscle every 3 (three) months.    Marland Kitchen lisinopril (ZESTRIL) 5 MG tablet TAKE 1 TABLET EVERY DAY 90 tablet 0  . LUMIGAN 0.01 % SOLN     . methylcellulose oral powder Take by mouth daily.    Marland Kitchen omeprazole (PRILOSEC) 40 MG capsule TAKE 1 CAPSULE EVERY DAY 90 capsule 1  . rosuvastatin (CRESTOR) 10 MG tablet TAKE 1 TABLET EVERY  DAY 90 tablet 1  . tamsulosin (FLOMAX) 0.4 MG CAPS capsule Take 0.4 mg by mouth. After supper    . timolol (TIMOPTIC) 0.5 % ophthalmic solution Place 1 drop into the left eye 2 (two) times daily.     . trospium (SANCTURA) 20 MG tablet Take 1 tablet (20 mg total) by mouth at bedtime. 30 tablet 0  . venlafaxine XR (EFFEXOR-XR) 37.5 MG 24 hr capsule TAKE 1 CAPSULE EVERY DAY WITH BREAKFAST 90 capsule 1   No current facility-administered medications for this visit.     PHYSICAL EXAMINATION: ECOG PERFORMANCE STATUS: 1 - Symptomatic but completely ambulatory  There were no vitals taken for this visit.  There were no vitals filed for this visit.  Physical Exam  Constitutional: He is oriented to person, place, and time.  Thin built cachectic appearing male  patient .  He is alone.  Walking by himself.  Appears pale.  HENT:  Head: Normocephalic and atraumatic.  Mouth/Throat: Oropharynx is clear and moist. No oropharyngeal exudate.  Eyes: Pupils are equal, round, and reactive to light.  Neck: Normal range of motion. Neck supple.  Cardiovascular: Normal rate and regular rhythm.  Pulmonary/Chest: No respiratory distress. He has no wheezes.  Abdominal: Soft. Bowel sounds are normal. He exhibits no distension and no mass. There is no abdominal tenderness. There is no rebound and no guarding.  Musculoskeletal: Normal range of motion.        General: No tenderness or edema.  Neurological: He is alert and oriented to person, place, and time.  Skin: Skin is warm.  Psychiatric: Affect normal.   LABORATORY DATA:  I have reviewed the data as listed    Component Value Date/Time   NA 140 12/25/2018 1426   K 4.0 12/25/2018 1426   CL 108 12/25/2018 1426   CO2 24 12/25/2018 1426   GLUCOSE 116 (H) 12/25/2018 1426   BUN 35 (H) 12/25/2018 1426   CREATININE 1.47 (H) 12/25/2018 1426   CALCIUM 9.2 12/25/2018 1426   PROT 7.7 10/16/2018 0957   ALBUMIN 4.1 11/13/2018 0828   AST 19 10/16/2018 0957   ALT 12  10/16/2018 0957   ALKPHOS 63 10/16/2018 0957   BILITOT 0.4 10/16/2018 0957   GFRNONAA 43 (L) 12/25/2018 1426   GFRAA 50 (L) 12/25/2018 1426    No results found for: SPEP, UPEP  Lab Results  Component Value Date   WBC 5.6 12/25/2018   NEUTROABS 4.0 12/25/2018   HGB 7.7 (L) 12/25/2018   HCT 25.8 (L) 12/25/2018   MCV 89.9 12/25/2018   PLT 283 12/25/2018      Chemistry      Component Value Date/Time   NA 140 12/25/2018 1426   K 4.0 12/25/2018 1426   CL 108 12/25/2018 1426   CO2 24 12/25/2018 1426   BUN 35 (H) 12/25/2018 1426   CREATININE 1.47 (H) 12/25/2018 1426      Component Value Date/Time   CALCIUM 9.2 12/25/2018 1426   ALKPHOS 63 10/16/2018 0957   AST 19 10/16/2018 0957   ALT 12 10/16/2018 0957   BILITOT 0.4 10/16/2018 0957       RADIOGRAPHIC STUDIES: I have personally reviewed the radiological images as listed and agreed with the findings in the report. No results found.   ASSESSMENT & PLAN:  No problem-specific Assessment & Plan notes found for this encounter.   No orders of the defined types were placed in this encounter.  All questions were answered. The patient knows to call the clinic with any problems, questions or concerns.      Cammie Sickle, MD 12/28/2018 1:02 PM

## 2018-12-28 NOTE — Patient Instructions (Signed)

## 2018-12-28 NOTE — Assessment & Plan Note (Deleted)
#  Normocytic anemia-unclear etiology; status post bone marrow biopsy/NGS normal.?-related to chronic disease/? CKD.  #Hemoglobin 9.3.  Improved status post IV Venofer.  Will start retacrit if hemoglobin continues to take less than 10.  #CKD stage III creatinine 1.5 -bilateral hydro-nephrosis /hydroureter -followed closely by urology.  # Metastatic prostate cancer Hormone sensitive- biochemical recurrence [M0]-on Lupron May 26th-PSA-2.08.   # DISPOSITION:  # In 3 weeks-Labs-H&H- possible retacrit. # follow up in 7 weeks- MD clinic-labs- cbc/bmp;possible Retacrit-Dr.B

## 2019-01-04 ENCOUNTER — Other Ambulatory Visit: Payer: Self-pay | Admitting: Family Medicine

## 2019-01-11 ENCOUNTER — Other Ambulatory Visit: Payer: Self-pay

## 2019-01-11 ENCOUNTER — Other Ambulatory Visit: Payer: Self-pay | Admitting: *Deleted

## 2019-01-11 DIAGNOSIS — C61 Malignant neoplasm of prostate: Secondary | ICD-10-CM

## 2019-01-11 DIAGNOSIS — D631 Anemia in chronic kidney disease: Secondary | ICD-10-CM

## 2019-01-11 DIAGNOSIS — N189 Chronic kidney disease, unspecified: Secondary | ICD-10-CM

## 2019-01-14 ENCOUNTER — Inpatient Hospital Stay (HOSPITAL_BASED_OUTPATIENT_CLINIC_OR_DEPARTMENT_OTHER): Payer: Medicare PPO | Admitting: Internal Medicine

## 2019-01-14 ENCOUNTER — Other Ambulatory Visit: Payer: Self-pay

## 2019-01-14 ENCOUNTER — Inpatient Hospital Stay: Payer: Medicare PPO

## 2019-01-14 ENCOUNTER — Encounter: Payer: Self-pay | Admitting: Internal Medicine

## 2019-01-14 VITALS — BP 121/66 | HR 80 | Temp 96.8°F | Resp 16 | Wt 143.0 lb

## 2019-01-14 DIAGNOSIS — C61 Malignant neoplasm of prostate: Secondary | ICD-10-CM | POA: Diagnosis not present

## 2019-01-14 DIAGNOSIS — N189 Chronic kidney disease, unspecified: Secondary | ICD-10-CM

## 2019-01-14 DIAGNOSIS — N183 Chronic kidney disease, stage 3 (moderate): Secondary | ICD-10-CM | POA: Diagnosis not present

## 2019-01-14 DIAGNOSIS — D631 Anemia in chronic kidney disease: Secondary | ICD-10-CM | POA: Diagnosis not present

## 2019-01-14 DIAGNOSIS — Z79818 Long term (current) use of other agents affecting estrogen receptors and estrogen levels: Secondary | ICD-10-CM | POA: Diagnosis not present

## 2019-01-14 DIAGNOSIS — R5382 Chronic fatigue, unspecified: Secondary | ICD-10-CM | POA: Diagnosis not present

## 2019-01-14 DIAGNOSIS — R5381 Other malaise: Secondary | ICD-10-CM | POA: Diagnosis not present

## 2019-01-14 DIAGNOSIS — Z923 Personal history of irradiation: Secondary | ICD-10-CM | POA: Diagnosis not present

## 2019-01-14 DIAGNOSIS — M25511 Pain in right shoulder: Secondary | ICD-10-CM | POA: Diagnosis not present

## 2019-01-14 DIAGNOSIS — D649 Anemia, unspecified: Secondary | ICD-10-CM | POA: Diagnosis not present

## 2019-01-14 DIAGNOSIS — I129 Hypertensive chronic kidney disease with stage 1 through stage 4 chronic kidney disease, or unspecified chronic kidney disease: Secondary | ICD-10-CM | POA: Diagnosis not present

## 2019-01-14 LAB — CBC WITH DIFFERENTIAL/PLATELET
Abs Immature Granulocytes: 0.02 10*3/uL (ref 0.00–0.07)
Basophils Absolute: 0 10*3/uL (ref 0.0–0.1)
Basophils Relative: 0 %
Eosinophils Absolute: 0.1 10*3/uL (ref 0.0–0.5)
Eosinophils Relative: 2 %
HCT: 30.9 % — ABNORMAL LOW (ref 39.0–52.0)
Hemoglobin: 9.1 g/dL — ABNORMAL LOW (ref 13.0–17.0)
Immature Granulocytes: 0 %
Lymphocytes Relative: 14 %
Lymphs Abs: 1 10*3/uL (ref 0.7–4.0)
MCH: 26.1 pg (ref 26.0–34.0)
MCHC: 29.4 g/dL — ABNORMAL LOW (ref 30.0–36.0)
MCV: 88.5 fL (ref 80.0–100.0)
Monocytes Absolute: 0.7 10*3/uL (ref 0.1–1.0)
Monocytes Relative: 9 %
Neutro Abs: 5.2 10*3/uL (ref 1.7–7.7)
Neutrophils Relative %: 75 %
Platelets: 246 10*3/uL (ref 150–400)
RBC: 3.49 MIL/uL — ABNORMAL LOW (ref 4.22–5.81)
RDW: 16.5 % — ABNORMAL HIGH (ref 11.5–15.5)
WBC: 7 10*3/uL (ref 4.0–10.5)
nRBC: 0 % (ref 0.0–0.2)

## 2019-01-14 LAB — BASIC METABOLIC PANEL
Anion gap: 11 (ref 5–15)
BUN: 34 mg/dL — ABNORMAL HIGH (ref 8–23)
CO2: 23 mmol/L (ref 22–32)
Calcium: 9.5 mg/dL (ref 8.9–10.3)
Chloride: 107 mmol/L (ref 98–111)
Creatinine, Ser: 1.35 mg/dL — ABNORMAL HIGH (ref 0.61–1.24)
GFR calc Af Amer: 55 mL/min — ABNORMAL LOW (ref >60)
GFR calc non Af Amer: 48 mL/min — ABNORMAL LOW (ref >60)
Glucose, Bld: 119 mg/dL — ABNORMAL HIGH (ref 70–99)
Potassium: 4.3 mmol/L (ref 3.5–5.1)
Sodium: 141 mmol/L (ref 135–145)

## 2019-01-14 MED ORDER — EPOETIN ALFA-EPBX 10000 UNIT/ML IJ SOLN
20000.0000 [IU] | Freq: Once | INTRAMUSCULAR | Status: AC
  Administered 2019-01-14: 20000 [IU] via SUBCUTANEOUS
  Filled 2019-01-14: qty 2
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
## 2019-02-01 ENCOUNTER — Other Ambulatory Visit: Payer: Self-pay | Admitting: Urology

## 2019-02-01 MED ORDER — TROSPIUM CHLORIDE 20 MG PO TABS: 20.0000 mg | ORAL_TABLET | Freq: Every day | ORAL | 0 refills | Status: DC

## 2019-02-01 NOTE — Telephone Encounter (Signed)
Pt called to let us know meds are working well and he will need more sent to pharmacy.  (Sanctura 20 mg sent to [facility])

## 2019-02-01 NOTE — Telephone Encounter (Signed)
RX sent in as requested-patient notified.

## 2019-02-04 ENCOUNTER — Inpatient Hospital Stay: Payer: Medicare PPO | Attending: Internal Medicine

## 2019-02-22 NOTE — Progress Notes (Signed)
Patient is coming in for follow up he is doing well no complaints  

## 2019-02-23 DIAGNOSIS — E785 Hyperlipidemia, unspecified: Secondary | ICD-10-CM | POA: Diagnosis not present

## 2019-02-23 DIAGNOSIS — N3281 Overactive bladder: Secondary | ICD-10-CM | POA: Diagnosis not present

## 2019-02-23 DIAGNOSIS — H409 Unspecified glaucoma: Secondary | ICD-10-CM | POA: Diagnosis not present

## 2019-02-23 DIAGNOSIS — Z6821 Body mass index (BMI) 21.0-21.9, adult: Secondary | ICD-10-CM | POA: Diagnosis not present

## 2019-02-23 DIAGNOSIS — N400 Enlarged prostate without lower urinary tract symptoms: Secondary | ICD-10-CM | POA: Diagnosis not present

## 2019-02-23 DIAGNOSIS — F419 Anxiety disorder, unspecified: Secondary | ICD-10-CM | POA: Diagnosis not present

## 2019-02-23 DIAGNOSIS — C61 Malignant neoplasm of prostate: Secondary | ICD-10-CM | POA: Diagnosis not present

## 2019-02-23 DIAGNOSIS — K219 Gastro-esophageal reflux disease without esophagitis: Secondary | ICD-10-CM | POA: Diagnosis not present

## 2019-02-23 DIAGNOSIS — I10 Essential (primary) hypertension: Secondary | ICD-10-CM | POA: Diagnosis not present

## 2019-02-25 ENCOUNTER — Inpatient Hospital Stay: Payer: Medicare PPO

## 2019-02-25 ENCOUNTER — Inpatient Hospital Stay: Payer: Medicare PPO | Attending: Internal Medicine

## 2019-02-25 ENCOUNTER — Other Ambulatory Visit: Payer: Self-pay

## 2019-02-25 ENCOUNTER — Inpatient Hospital Stay (HOSPITAL_BASED_OUTPATIENT_CLINIC_OR_DEPARTMENT_OTHER): Payer: Medicare PPO | Admitting: Internal Medicine

## 2019-02-25 DIAGNOSIS — C61 Malignant neoplasm of prostate: Secondary | ICD-10-CM

## 2019-02-25 DIAGNOSIS — I129 Hypertensive chronic kidney disease with stage 1 through stage 4 chronic kidney disease, or unspecified chronic kidney disease: Secondary | ICD-10-CM | POA: Diagnosis not present

## 2019-02-25 DIAGNOSIS — Z7982 Long term (current) use of aspirin: Secondary | ICD-10-CM | POA: Diagnosis not present

## 2019-02-25 DIAGNOSIS — Z8546 Personal history of malignant neoplasm of prostate: Secondary | ICD-10-CM | POA: Diagnosis not present

## 2019-02-25 DIAGNOSIS — R5383 Other fatigue: Secondary | ICD-10-CM | POA: Insufficient documentation

## 2019-02-25 DIAGNOSIS — R0609 Other forms of dyspnea: Secondary | ICD-10-CM | POA: Insufficient documentation

## 2019-02-25 DIAGNOSIS — Z87891 Personal history of nicotine dependence: Secondary | ICD-10-CM | POA: Insufficient documentation

## 2019-02-25 DIAGNOSIS — Z79899 Other long term (current) drug therapy: Secondary | ICD-10-CM | POA: Diagnosis not present

## 2019-02-25 DIAGNOSIS — K589 Irritable bowel syndrome without diarrhea: Secondary | ICD-10-CM | POA: Insufficient documentation

## 2019-02-25 DIAGNOSIS — E785 Hyperlipidemia, unspecified: Secondary | ICD-10-CM | POA: Insufficient documentation

## 2019-02-25 DIAGNOSIS — N183 Chronic kidney disease, stage 3 unspecified: Secondary | ICD-10-CM | POA: Insufficient documentation

## 2019-02-25 DIAGNOSIS — D649 Anemia, unspecified: Secondary | ICD-10-CM | POA: Diagnosis not present

## 2019-02-25 DIAGNOSIS — R5381 Other malaise: Secondary | ICD-10-CM | POA: Insufficient documentation

## 2019-02-25 DIAGNOSIS — Z923 Personal history of irradiation: Secondary | ICD-10-CM | POA: Diagnosis not present

## 2019-02-25 LAB — CBC WITH DIFFERENTIAL/PLATELET
Abs Immature Granulocytes: 0.02 10*3/uL (ref 0.00–0.07)
Basophils Absolute: 0 10*3/uL (ref 0.0–0.1)
Basophils Relative: 0 %
Eosinophils Absolute: 0.3 10*3/uL (ref 0.0–0.5)
Eosinophils Relative: 4 %
HCT: 31.4 % — ABNORMAL LOW (ref 39.0–52.0)
Hemoglobin: 9.4 g/dL — ABNORMAL LOW (ref 13.0–17.0)
Immature Granulocytes: 0 %
Lymphocytes Relative: 13 %
Lymphs Abs: 0.8 10*3/uL (ref 0.7–4.0)
MCH: 25.9 pg — ABNORMAL LOW (ref 26.0–34.0)
MCHC: 29.9 g/dL — ABNORMAL LOW (ref 30.0–36.0)
MCV: 86.5 fL (ref 80.0–100.0)
Monocytes Absolute: 0.5 10*3/uL (ref 0.1–1.0)
Monocytes Relative: 7 %
Neutro Abs: 5.1 10*3/uL (ref 1.7–7.7)
Neutrophils Relative %: 76 %
Platelets: 246 10*3/uL (ref 150–400)
RBC: 3.63 MIL/uL — ABNORMAL LOW (ref 4.22–5.81)
RDW: 16.1 % — ABNORMAL HIGH (ref 11.5–15.5)
WBC: 6.7 10*3/uL (ref 4.0–10.5)
nRBC: 0 % (ref 0.0–0.2)

## 2019-02-25 LAB — PSA: Prostatic Specific Antigen: 3.94 ng/mL (ref 0.00–4.00)

## 2019-02-25 LAB — BASIC METABOLIC PANEL
Anion gap: 10 (ref 5–15)
BUN: 37 mg/dL — ABNORMAL HIGH (ref 8–23)
CO2: 26 mmol/L (ref 22–32)
Calcium: 9.1 mg/dL (ref 8.9–10.3)
Chloride: 109 mmol/L (ref 98–111)
Creatinine, Ser: 1.26 mg/dL — ABNORMAL HIGH (ref 0.61–1.24)
GFR calc Af Amer: >60 mL/min (ref >60)
GFR calc non Af Amer: 52 mL/min — ABNORMAL LOW (ref >60)
Glucose, Bld: 100 mg/dL — ABNORMAL HIGH (ref 70–99)
Potassium: 3.9 mmol/L (ref 3.5–5.1)
Sodium: 145 mmol/L (ref 135–145)

## 2019-02-25 MED ORDER — EPOETIN ALFA-EPBX 10000 UNIT/ML IJ SOLN
20000.0000 [IU] | Freq: Once | INTRAMUSCULAR | Status: AC
  Administered 2019-02-25: 16:00:00 20000 [IU] via SUBCUTANEOUS
  Filled 2019-02-25: qty 2

## 2019-02-25 NOTE — Assessment & Plan Note (Addendum)
#  Normocytic anemia-unclear etiology; status post bone marrow biopsy/NGS normal.?-related to chronic disease/? CKD.  #Hemoglobin 9.3.  Improved status post IV Venofer.  Proceed with Retacrit today.  #CKD stage III creatinine 1.3-stable-bilateral hydro-nephrosis /hydroureter -followed closely by urology.  # Metastatic prostate cancer Hormone sensitive- biochemical recurrence [M0]-on Lupron May 26th-PSA-2.08. awaiting PSA from today.  If PSA continues to rise would recommend bone scan/imaging.  # DISPOSITION: [D-2 retacrit/insurance] # retacrit today # labs- H&H in 4 weeks; d-2 retacrit # follow up in 8 weeks- MD clinic-labs- cbc/bmp/ D-2 retacrit; Dr.B

## 2019-02-25 NOTE — Progress Notes (Signed)
[place] OFFICE PROGRESS NOTE  Patient Care Team: [name], MD as PCP - General (Family Medicine) Bond, Tracie Harrier, MD as Consulting Physician (Ophthalmology) Abbie Sons, MD as Consulting Physician (Urology)  Cancer Staging No matching staging information was found for the patient.   Oncology History Overview Note  # external beam radiation therapy and radiation seed implant in 2005 with Dr. Joelyn Oms and Dr. Danny Lawless for T1c Gleason 3+4 = 7 prostate cancer with a pretreatment PSA of 6.8. In November 2015, he was found to have biochemical recurrence.  Dec 2016- CT a/p & Bone scan- NEG; PSA- 11; June 2017- 19; STARTED on Firmagon [Dr.Budzyn]; on Lupron q 71M  # OCT 2018- Anemia-? Etiology; Likely anemia of chronic disease [anti-CCP elevated/ but NO RA; rheumatology; ]BMBx- hypercellular 30%; relative myeloid hyperplasia/erythroid hypoplasia; FISH- 5/7/8-Normal. II OPINION at [facility]. [facility]- NEG  # CKD- III; Bladder neck obstruction s/p Foley [Dr.Stoiff]  # JAN 2018- Dexa scan- N ------------------------------------   DIAGNOSIS: PROSTATE CANCER  STAGE:  IV       ;GOALS: control  CURRENT/MOST RECENT THERAPY: Lupron    Primary adenocarcinoma of prostate ([place])      INTERVAL HISTORY:  Thomas Lopez 85 y.o.  male pleasant patient above history of anemia of unclear etiology is here for follow-up.  Patient states his appetite is good.  No weight loss.  No nausea no vomiting.  His energy levels are low.  Complains of shortness of breath on exertion.  Otherwise no blood in stools or black or stools.   Review of Systems  Constitutional: Positive for malaise/fatigue. Negative for chills, diaphoresis and fever.  HENT: Negative for nosebleeds and sore throat.   Eyes: Negative for double vision.  Respiratory: Positive for shortness of breath. Negative for cough, hemoptysis, sputum production and wheezing.   Cardiovascular: Negative for chest  pain, palpitations, orthopnea and leg swelling.  Gastrointestinal: Negative for abdominal pain, blood in stool, constipation, diarrhea, heartburn, melena, nausea and vomiting.  Genitourinary: Negative for dysuria, frequency and urgency.  Musculoskeletal: Positive for joint pain. Negative for back pain.  Skin: Negative.  Negative for itching and rash.  Neurological: Negative for dizziness, tingling, focal weakness, weakness and headaches.  Endo/Heme/Allergies: Does not bruise/bleed easily.  Psychiatric/Behavioral: Negative for depression. The patient is not nervous/anxious and does not have insomnia.       PAST MEDICAL HISTORY :  Past Medical History:  Diagnosis Date  . Aorto-iliac atherosclerosis ([place]) 04/2015   by CT scan  . Carotid stenosis 11/2014   mild bilateral 1-39%, rpt 2 yrs  . Colon polyps    rpt colonoscopy due 2014  . ED (erectile dysfunction)    Trimix and VED  . Esophagitis   . Essential hypertension 06/29/2009  . GERD (gastroesophageal reflux disease)   . Glaucoma    severe ([place])  . HLD (hyperlipidemia)   . HTN (hypertension)   . Hyperglycemia   . IBS (irritable bowel syndrome)   . Prostate cancer [facility]) 2005   s/p seed implant, EBRT (Dr. Alinda Money at [facility]) recurrent treating with androgen deprivation referred to cancer center  . Smoking history     PAST SURGICAL HISTORY :   Past Surgical History:  Procedure Laterality Date  . CATARACT EXTRACTION Left 05/2017   Dr. Edilia Bo  . CATARACT EXTRACTION, BILATERAL  December 2016   [place], Dr. Raynelle Fanning  . COLONOSCOPY  03/01/01   Multiple polyps, divertic//adenomatous polyps  . COLONOSCOPY  04/24/01  multiple polyps  . COLONOSCOPY  10/23/01   Polyps benign-repeat every 2 years  . COLONOSCOPY  06/23/04   Adenom/hyperplastic polyps; multiple external hemorrhoids  . COLONOSCOPY  06/08/07   single small polyp-repeat 5 years  . CYSTOSCOPY WITH INSERTION OF UROLIFT N/A 04/10/2018   Procedure: CYSTOSCOPY  WITH INSERTION OF UROLIFT;  Surgeon: Abbie Sons, MD;  Location: [facility] ORS;  Service: Urology;  Laterality: N/A;  . ESOPHAGOGASTRODUODENOSCOPY  03/01/01   H.H.; gastritis esoph. dudodenitis  . NCS/Wrists Left Carpal  2/02   Min./right improved  . PFTs  10/2012   FVC 64%, FEV1 41%, ratio 0.62  . PROSTATE BIOPSY  12/04   positive; radioactive seed implant (Dr. Joelyn Oms)  . Prostate Ext Beam Radiation  1/27-07/23/03   Dr. Danny Lawless  . SPIROMETRY  09/2011   WNL  . TONSILLECTOMY    . Wrist Release  6/98; 9/99   Right    FAMILY HISTORY :   Family History  Problem Relation Age of Onset  . Cancer Mother        colon  . Hyperlipidemia Mother   . Other Mother        Carotid disease  . Coronary artery disease Neg Hx   . Stroke Neg Hx     SOCIAL HISTORY:   Social History   Tobacco Use  . Smoking status: Former Smoker    Quit date: 05/23/1993    Years since quitting: 25.7  . Smokeless tobacco: Never Used  Substance Use Topics  . Alcohol use: Yes    Comment: Rare  . Drug use: No    ALLERGIES:  has No Known Allergies.  MEDICATIONS:  Current Outpatient Medications  Medication Sig Dispense Refill  . aspirin EC 81 MG tablet Take 81 mg by mouth daily.    . Calcium Carbonate-Vitamin D (CALCIUM 600/VITAMIN D) 600-400 MG-UNIT chew tablet Chew 1 tablet by mouth daily.    . ferrous sulfate 325 (65 FE) MG tablet Take 1 tablet (325 mg total) by mouth daily with breakfast.  3  . leuprolide (LUPRON) 22.5 MG injection Inject 22.5 mg into the muscle every 3 (three) months.    Marland Kitchen lisinopril (ZESTRIL) 5 MG tablet TAKE 1 TABLET EVERY DAY 90 tablet 3  . LUMIGAN 0.01 % SOLN     . methylcellulose oral powder Take by mouth daily.    Marland Kitchen omeprazole (PRILOSEC) 40 MG capsule TAKE 1 CAPSULE EVERY DAY 90 capsule 1  . rosuvastatin (CRESTOR) 10 MG tablet TAKE 1 TABLET EVERY DAY 90 tablet 1  . tamsulosin (FLOMAX) 0.4 MG CAPS capsule Take 0.4 mg by mouth. After supper    . timolol (TIMOPTIC) 0.5 % ophthalmic  solution Place 1 drop into the left eye 2 (two) times daily.     . trospium (SANCTURA) 20 MG tablet Take 1 tablet (20 mg total) by mouth at bedtime. 30 tablet 0  . venlafaxine XR (EFFEXOR-XR) 37.5 MG 24 hr capsule TAKE 1 CAPSULE EVERY DAY WITH BREAKFAST 90 capsule 1   No current facility-administered medications for this visit.     PHYSICAL EXAMINATION: ECOG PERFORMANCE STATUS: 1 - Symptomatic but completely ambulatory  BP (!) 109/54 (BP Location: Left Arm, Patient Position: Sitting)   Pulse (!) 59   Temp 97.9 F (36.6 C) (Tympanic)   Wt 148 lb (67.1 kg)   BMI 21.86 kg/m   Filed Weights   02/25/19 1501  Weight: 148 lb (67.1 kg)    Physical Exam  Constitutional: He is oriented to person,  place, and time.  Thin built cachectic appearing male patient .  He is alone.  Walking by himself.  Appears pale.  HENT:  Head: Normocephalic and atraumatic.  Mouth/Throat: Oropharynx is clear and moist. No oropharyngeal exudate.  Eyes: Pupils are equal, round, and reactive to light.  Neck: Normal range of motion. Neck supple.  Cardiovascular: Normal rate and regular rhythm.  Pulmonary/Chest: No respiratory distress. He has no wheezes.  Abdominal: Soft. Bowel sounds are normal. He exhibits no distension and no mass. There is no abdominal tenderness. There is no rebound and no guarding.  Musculoskeletal: Normal range of motion.        General: No tenderness or edema.  Neurological: He is alert and oriented to person, place, and time.  Skin: Skin is warm.  Psychiatric: Affect normal.   LABORATORY DATA:  I have reviewed the data as listed    Component Value Date/Time   NA 145 02/25/2019 1432   K 3.9 02/25/2019 1432   CL 109 02/25/2019 1432   CO2 26 02/25/2019 1432   GLUCOSE 100 (H) 02/25/2019 1432   BUN 37 (H) 02/25/2019 1432   CREATININE 1.26 (H) 02/25/2019 1432   CALCIUM 9.1 02/25/2019 1432   PROT 7.7 10/16/2018 0957   ALBUMIN 4.1 11/13/2018 0828   AST 19 10/16/2018 0957   ALT 12  10/16/2018 0957   ALKPHOS 63 10/16/2018 0957   BILITOT 0.4 10/16/2018 0957   GFRNONAA 52 (L) 02/25/2019 1432   GFRAA >60 02/25/2019 1432    No results found for: SPEP, UPEP  Lab Results  Component Value Date   WBC 6.7 02/25/2019   NEUTROABS 5.1 02/25/2019   HGB 9.4 (L) 02/25/2019   HCT 31.4 (L) 02/25/2019   MCV 86.5 02/25/2019   PLT 246 02/25/2019      Chemistry      Component Value Date/Time   NA 145 02/25/2019 1432   K 3.9 02/25/2019 1432   CL 109 02/25/2019 1432   CO2 26 02/25/2019 1432   BUN 37 (H) 02/25/2019 1432   CREATININE 1.26 (H) 02/25/2019 1432      Component Value Date/Time   CALCIUM 9.1 02/25/2019 1432   ALKPHOS 63 10/16/2018 0957   AST 19 10/16/2018 0957   ALT 12 10/16/2018 0957   BILITOT 0.4 10/16/2018 0957       RADIOGRAPHIC STUDIES: I have personally reviewed the radiological images as listed and agreed with the findings in the report. No results found.   ASSESSMENT & PLAN:  Normocytic anemia #Normocytic anemia-unclear etiology; status post bone marrow biopsy/NGS normal.?-related to chronic disease/? CKD.  #Hemoglobin 9.3.  Improved status post IV Venofer.  Proceed with Retacrit today.  #CKD stage III creatinine 1.3-stable-bilateral hydro-nephrosis /hydroureter -followed closely by urology.  # Metastatic prostate cancer Hormone sensitive- biochemical recurrence [M0]-on Lupron May 26th-PSA-2.08. awaiting PSA from today.  If PSA continues to rise would recommend bone scan/imaging.  # DISPOSITION: [D-2 retacrit/insurance] # retacrit today # labs- H&H in 4 weeks; d-2 retacrit # follow up in 8 weeks- MD clinic-labs- cbc/bmp/ D-2 retacrit; Dr.B    No orders of the defined types were placed in this encounter.  All questions were answered. The patient knows to call the clinic with any problems, questions or concerns.      Cammie Sickle, MD 02/25/2019 4:04 PM

## 2019-03-01 ENCOUNTER — Telehealth: Payer: Self-pay

## 2019-03-01 NOTE — Telephone Encounter (Signed)
Received a voicemail from Teresita Madura, NP for [facility]. She completes Wellness visits for [facility] in patients home. She did a PAD testing and his right foot was positive for severe PAD but patient is not complaining of any numbness, burning or pain. His extremities were warm but she said they have to report abnormal reading even if this is a not a new issue. If we have any questions for Joelene Millin her CB is [phone].

## 2019-03-08 NOTE — Telephone Encounter (Signed)
plz notify pt we received notice from [place] about home visit where he screened positive for peripheral arterial disease or plaque buildup in the arteries of the legs. Would offer office visit to discuss this and for further evaluation at his convenience.

## 2019-03-08 NOTE — Telephone Encounter (Signed)
Left message on vm per dpr asking pt to call back.  Needs to schedule 'in office' visit for PAD.

## 2019-03-08 NOTE — Telephone Encounter (Signed)
The only new info provided by Charlett Nose was the lt foot was significant for PAD.

## 2019-03-11 NOTE — Telephone Encounter (Signed)
Scheduled for 03/12/19 at 9:00.

## 2019-03-11 NOTE — Telephone Encounter (Signed)
Left message on vm per dpr asking pt to call back.  Needs to schedule 'in office' visit for PAD.

## 2019-03-12 ENCOUNTER — Ambulatory Visit (INDEPENDENT_AMBULATORY_CARE_PROVIDER_SITE_OTHER): Payer: Medicare PPO | Admitting: Family Medicine

## 2019-03-12 ENCOUNTER — Encounter: Payer: Self-pay | Admitting: Family Medicine

## 2019-03-12 ENCOUNTER — Other Ambulatory Visit: Payer: Self-pay

## 2019-03-12 VITALS — BP 136/64 | HR 48 | Temp 97.8°F | Ht 69.0 in | Wt 144.1 lb

## 2019-03-12 DIAGNOSIS — I10 Essential (primary) hypertension: Secondary | ICD-10-CM

## 2019-03-12 DIAGNOSIS — Z23 Encounter for immunization: Secondary | ICD-10-CM | POA: Diagnosis not present

## 2019-03-12 DIAGNOSIS — I708 Atherosclerosis of other arteries: Secondary | ICD-10-CM

## 2019-03-12 DIAGNOSIS — I739 Peripheral vascular disease, unspecified: Secondary | ICD-10-CM

## 2019-03-12 DIAGNOSIS — I6523 Occlusion and stenosis of bilateral carotid arteries: Secondary | ICD-10-CM

## 2019-03-12 DIAGNOSIS — E782 Mixed hyperlipidemia: Secondary | ICD-10-CM

## 2019-03-12 DIAGNOSIS — I700 Atherosclerosis of aorta: Secondary | ICD-10-CM

## 2019-03-12 MED ORDER — VITAMIN D3 25 MCG (1000 UT) PO CAPS: 1.0000 | ORAL_CAPSULE | Freq: Every day | ORAL | Status: AC

## 2019-03-12 NOTE — Patient Instructions (Addendum)
Flu shot today Stop calcium/vitamin D daily, start plain vitamin D3 1000 units daily.  Screening test for arterial or vascular disease in legs returned positive. We will refer you for formal testing at heart doctor's office.  Continue other medicines (aspirin, crestor, lisinopril).   Peripheral Vascular Disease Peripheral vascular disease (PVD) is a disease of the blood vessels. A simple term for PVD is poor circulation. In most cases, PVD narrows the blood vessels that carry blood from your heart to the rest of your body. This can result in a decreased supply of blood to your arms, legs, and internal organs, like your stomach or kidneys. However, it most often affects a person's lower legs and feet. There are two types of PVD.  Organic PVD. This is the more common type. It is caused by damage to the structure of blood vessels.  Functional PVD. This is caused by conditions that make blood vessels contract and tighten (spasm). Without treatment, PVD tends to get worse over time. PVD can also lead to acute limb ischemia. This is when an arm or leg suddenly has trouble getting enough blood. This is a medical emergency. What are the causes?  Each type of PVD has many different causes. The most common cause of PVD is buildup of a fatty material (plaque) inside your arteries (atherosclerosis). Small amounts of plaque can break off from the walls of the blood vessels and become lodged in a smaller artery. This blocks blood flow and can cause acute limb ischemia. Other common causes of PVD include:  Blood clots that form inside of blood vessels.  Injuries to blood vessels.  Diseases that cause inflammation of blood vessels or cause blood vessel spasms.  Health behaviors and health history that increase your risk of developing PVD. What increases the risk? You are more likely to develop this condition if:  You have a family history of PVD.  You have certain medical conditions,  including: ? High cholesterol. ? Diabetes. ? High blood pressure (hypertension). ? Coronary heart disease. ? Past problems with blood clots. ? Past injury, such as burns or a broken bone. These may have damaged blood vessels in your limbs. ? Buerger disease. This is caused by inflamed blood vessels in your hands and feet. ? Some forms of arthritis. ? Rare birth defects that affect the arteries in your legs. ? Kidney disease.  You use tobacco or smoke.  You do not get enough exercise.  You are obese.  You are age 45 or older. What are the signs or symptoms? This condition may cause different symptoms. Your symptoms depend on what part of your body is not getting enough blood. Some common signs and symptoms include:  Cramps in your lower legs. This may be a symptom of poor leg circulation (claudication).  Pain and weakness in your legs. This happens while you are physically active but goes away when you rest (intermittent claudication).  Leg pain when at rest.  Leg numbness, tingling, or weakness.  Coldness in a leg or foot, especially when compared with the other leg.  Skin or hair changes. These can include: ? Hair loss. ? Shiny skin. ? Pale or bluish skin. ? Thick toenails.  Inability to get or maintain an erection (erectile dysfunction).  Fatigue. People with PVD are more likely to develop ulcers and sores on their toes, feet, or legs. These may take longer than normal to heal. How is this diagnosed? This condition is diagnosed based on:  Your signs and symptoms.  A physical exam and your medical history.  Other tests to find out what is causing your PVD and to determine its severity. Tests may include: ? Blood pressure recordings from your arms and legs and measurements of the strength of your pulses (pulse volume recordings). ? Imaging studies using sound waves to take pictures of the blood flow through your blood vessels (Doppler ultrasound). ? Injecting a dye  into your blood vessels before having imaging studies using:  X-rays (angiogram or arteriogram).  Computer-generated X-rays (CT angiogram).  A powerful electromagnetic field and a computer (magnetic resonance angiogram or MRA). How is this treated? Treatment for PVD depends on the cause of your condition and how severe your symptoms are. It also depends on your age. Underlying causes need to be treated and controlled. These include long-term (chronic) conditions, such as diabetes, high cholesterol, and high blood pressure. Treatment includes:  Lifestyle changes, such as: ? Quitting smoking. ? Exercising regularly. ? Following a low-fat, low-cholesterol diet.  Taking medicines, such as: ? Blood thinners to prevent blood clots. ? Medicines to improve blood flow. ? Medicines to improve your blood cholesterol levels.  Surgical procedures, such as: ? A procedure that uses an inflated balloon to open a blocked artery and improve blood flow (angioplasty). ? A procedure to put in a wire mesh tube to keep a blocked artery open (stent implant). ? Surgery to reroute blood flow around a blocked artery (peripheral bypass surgery). ? Surgery to remove dead tissue from an infected wound on the affected limb. ? Amputation. This is surgical removal of the affected limb. It may be necessary in cases of acute limb ischemia where there has been no improvement through medical or surgical treatments. Follow these instructions at home: Lifestyle  Do not use any products that contain nicotine or tobacco, such as cigarettes and e-cigarettes. If you need help quitting, ask your health care provider.  Lose weight if you are overweight, and maintain a healthy weight as discussed by your health care provider.  Eat a diet that is low in fat and cholesterol. If you need help, ask your health care provider.  Exercise regularly. Ask your health care provider to suggest some good activities for you. General  instructions  Take over-the-counter and prescription medicines only as told by your health care provider.  Take good care of your feet: ? Wear comfortable shoes that fit well. ? Check your feet often for any cuts or sores.  Keep all follow-up visits as told by your health care provider. This is important. Contact a health care provider if:  You have cramps in your legs while walking.  You have leg pain when you are at rest.  You have coldness in a leg or foot.  Your skin changes.  You have erectile dysfunction.  You have cuts or sores on your feet that are not healing. Get help right away if:  Your arm or leg turns cold, numb, and blue.  Your arms or legs become red, warm, swollen, painful, or numb.  You have chest pain or trouble breathing.  You suddenly have weakness in your face, arm, or leg.  You become very confused or lose the ability to speak.  You suddenly have a very bad headache or lose your vision. Summary  Peripheral vascular disease (PVD) is a disease of the blood vessels.  In most cases, PVD narrows the blood vessels that carry blood from your heart to the rest of your body.  PVD may cause different  symptoms. Your symptoms depend on what part of your body is not getting enough blood.  Treatment for PVD depends on the cause of your condition and how severe your symptoms are. This information is not intended to replace advice given to you by your health care provider. Make sure you discuss any questions you have with your health care provider. Document Released: 06/16/2004 Document Revised: 04/21/2017 Document Reviewed: 06/16/2016 Elsevier Patient Education  2020 [employer].

## 2019-03-12 NOTE — Assessment & Plan Note (Signed)
Significant PAD by screening ABIs by home insurance test. Reviewed possible PAD diagnosis with patient, as well as implications. rec stop calcium supplement, take plain vitamin D 1000 IU daily. rec continue aspirin, statin, encouraged good BP and lipid control. He quit smoking 1995. Fortunately pt remains asymptomatic. Will refer for formal arterial evaluation with ABIs given severity of initial screening test. Discussed possible vascular consult but doubt will need intervention given asxs.

## 2019-03-12 NOTE — Assessment & Plan Note (Signed)
No bruit appreciated today. Last carotid US 2018.

## 2019-03-12 NOTE — Progress Notes (Signed)
This visit was conducted in person.  BP 136/64 (BP Location: Left Arm, Patient Position: Sitting, Cuff Size: Normal)   Pulse (!) 48   Temp 97.8 F (36.6 C) (Temporal)   Ht 5' 9"  (1.753 m)   Wt 144 lb 1 oz (65.3 kg)   SpO2 100%   BMI 21.27 kg/m    CC: f/u PAD Subjective:    Patient ID: Thomas Lopez, male    DOB: [birth year], 85 y.o.   MRN: [id number]  HPI: Thomas Lopez is a 85 y.o. male presenting on 03/12/2019 for PAD   We recently received notice from [facility] home assessment that patient screened positive for PAD with home ABI showing R foot 0.26 (severe) and L foot 0.30 (significant). Patient currently asymptomatic - denies leg pain or calf claudication with ambulation or at rest. No h/o arterial or venous insufficiency ulcers of BLE. Denies chest pain or tightness, dyspnea, dizziness, HA.   Intermittent R shoulder pain.   He is on aspirin 58m daily, as well as low dose lisinopril and crestor 171mdaily. He does take calcium/vit D daily. DEXA 2018 WNL.   Known aorti-iliac ATH disease by CT scan, known mild bilateral carotid stenosis.   Ex smoker - quit 1995.   Known metastatic prostate cancer followed by onc (Dr BrRogue Bussingas well as chronic normocytic anemia of unclear etiology s/p bone marrow biopsy, treated with IV venofer and retacrit ?chronic disease/CKD.      Relevant past medical, surgical, family and social history reviewed and updated as indicated. Interim medical history since our last visit reviewed. Allergies and medications reviewed and updated. Outpatient Medications Prior to Visit  Medication Sig Dispense Refill  . aspirin EC 81 MG tablet Take 81 mg by mouth daily.    . dorzolamide-timolol (COSOPT) 22.3-6.8 MG/ML ophthalmic solution Place 1 drop into the left eye 2 (two) times daily.    . ferrous sulfate 325 (65 FE) MG tablet Take 1 tablet (325 mg total) by mouth daily with breakfast.  3  . leuprolide (LUPRON) 22.5 MG injection Inject 22.5 mg into the  muscle every 3 (three) months.    . Marland Kitchenisinopril (ZESTRIL) 5 MG tablet TAKE 1 TABLET EVERY DAY 90 tablet 3  . methylcellulose oral powder Take by mouth daily.    . Marland Kitchenmeprazole (PRILOSEC) 40 MG capsule TAKE 1 CAPSULE EVERY DAY 90 capsule 1  . rosuvastatin (CRESTOR) 10 MG tablet TAKE 1 TABLET EVERY DAY 90 tablet 1  . tamsulosin (FLOMAX) 0.4 MG CAPS capsule Take 0.4 mg by mouth. After supper    . timolol (TIMOPTIC) 0.5 % ophthalmic solution Place 1 drop into the left eye 2 (two) times daily.     . trospium (SANCTURA) 20 MG tablet Take 1 tablet (20 mg total) by mouth at bedtime. 30 tablet 0  . venlafaxine XR (EFFEXOR-XR) 37.5 MG 24 hr capsule TAKE 1 CAPSULE EVERY DAY WITH BREAKFAST 90 capsule 1  . Calcium Carbonate-Vitamin D (CALCIUM 600/VITAMIN D) 600-400 MG-UNIT chew tablet Chew 1 tablet by mouth daily.    . Marland KitchenUMIGAN 0.01 % SOLN      No facility-administered medications prior to visit.      Per HPI unless specifically indicated in ROS section below Review of Systems Objective:    BP 136/64 (BP Location: Left Arm, Patient Position: Sitting, Cuff Size: Normal)   Pulse (!) 48   Temp 97.8 F (36.6 C) (Temporal)   Ht 5' 9"  (1.753 m)   Wt 144 lb 1 oz (65.3  kg)   SpO2 100%   BMI 21.27 kg/m   Wt Readings from Last 3 Encounters:  03/12/19 144 lb 1 oz (65.3 kg)  02/25/19 148 lb (67.1 kg)  01/14/19 143 lb (64.9 kg)    Physical Exam Vitals signs and nursing note reviewed.  Constitutional:      General: He is not in acute distress.    Appearance: Normal appearance. He is not ill-appearing.  Eyes:     Extraocular Movements: Extraocular movements intact.     Pupils: Pupils are equal, round, and reactive to light.  Neck:     Vascular: No carotid bruit.  Cardiovascular:     Rate and Rhythm: Normal rate and regular rhythm.     Pulses: Normal pulses.     Heart sounds: Normal heart sounds. No murmur.  Pulmonary:     Effort: Pulmonary effort is normal. No respiratory distress.     Breath  sounds: Normal breath sounds. No wheezing, rhonchi or rales.  Musculoskeletal:     Right lower leg: No edema.     Left lower leg: No edema.     Comments:  Diminished pedal pulses BLE No ulcerations of BLE Some dry skin to BLE  Skin:    General: Skin is warm and dry.     Findings: No erythema or rash.  Neurological:     Mental Status: He is alert.  Psychiatric:        Mood and Affect: Mood normal.        Behavior: Behavior normal.       Lab Results  Component Value Date   CHOL 151 11/13/2018   HDL 41.60 11/13/2018   LDLCALC 79 11/13/2018   TRIG 151.0 (H) 11/13/2018   CHOLHDL 4 11/13/2018    Assessment & Plan:   Problem List Items Addressed This Visit    PAD (peripheral artery disease) ([place]) - Primary    Significant PAD by screening ABIs by home insurance test. Reviewed possible PAD diagnosis with patient, as well as implications. rec stop calcium supplement, take plain vitamin D 1000 IU daily. rec continue aspirin, statin, encouraged good BP and lipid control. He quit smoking 1995. Fortunately pt remains asymptomatic. Will refer for formal arterial evaluation with ABIs given severity of initial screening test. Discussed possible vascular consult but doubt will need intervention given asxs.       Relevant Orders   VAS [place] LE ART SEG MULTI (Segm&LE Reynauds)   Mixed hyperlipidemia   Essential hypertension   Carotid stenosis    No bruit appreciated today. Last carotid US 2018.       Aorto-iliac atherosclerosis ([place])    Other Visit Diagnoses    Need for influenza vaccination       Relevant Orders   Flu Vaccine QUAD High Dose(Fluad) (Completed)       Meds ordered this encounter  Medications  . Cholecalciferol (VITAMIN D3) 25 MCG (1000 UT) CAPS    Sig: Take 1 capsule (1,000 Units total) by mouth daily.    Dispense:  30 capsule   Orders Placed This Encounter  Procedures  . Flu Vaccine QUAD High Dose(Fluad)    Patient instructions: Flu shot today Stop  calcium/vitamin D daily, start plain vitamin D3 1000 units daily.  Screening test for arterial or vascular disease in legs returned positive. We will refer you for formal testing at heart doctor's office.  Continue other medicines (aspirin, crestor, lisinopril).   Follow up plan: No follow-ups on file.  [name], MD

## 2019-03-14 ENCOUNTER — Other Ambulatory Visit: Payer: Self-pay

## 2019-03-14 ENCOUNTER — Other Ambulatory Visit (HOSPITAL_COMMUNITY): Payer: Self-pay | Admitting: Family Medicine

## 2019-03-14 ENCOUNTER — Ambulatory Visit (HOSPITAL_COMMUNITY)
Admission: RE | Admit: 2019-03-14 | Discharge: 2019-03-14 | Disposition: A | Discharge status: Home / Self Care | Payer: Medicare PPO | Source: Ambulatory Visit | Attending: Cardiology | Admitting: Cardiology

## 2019-03-14 DIAGNOSIS — I739 Peripheral vascular disease, unspecified: Secondary | ICD-10-CM

## 2019-03-16 ENCOUNTER — Other Ambulatory Visit: Payer: Self-pay | Admitting: Family Medicine

## 2019-03-16 DIAGNOSIS — I739 Peripheral vascular disease, unspecified: Secondary | ICD-10-CM

## 2019-03-22 ENCOUNTER — Other Ambulatory Visit: Payer: Self-pay

## 2019-03-22 ENCOUNTER — Ambulatory Visit (HOSPITAL_COMMUNITY)
Admission: RE | Admit: 2019-03-22 | Discharge: 2019-03-22 | Disposition: A | Discharge status: Home / Self Care | Payer: Medicare PPO | Source: Ambulatory Visit | Attending: Cardiovascular Disease | Admitting: Cardiovascular Disease

## 2019-03-22 DIAGNOSIS — I739 Peripheral vascular disease, unspecified: Secondary | ICD-10-CM | POA: Diagnosis not present

## 2019-03-25 ENCOUNTER — Other Ambulatory Visit: Payer: Self-pay

## 2019-03-25 ENCOUNTER — Inpatient Hospital Stay: Payer: Medicare PPO | Attending: Internal Medicine

## 2019-03-25 DIAGNOSIS — N183 Chronic kidney disease, stage 3 unspecified: Secondary | ICD-10-CM | POA: Diagnosis not present

## 2019-03-25 DIAGNOSIS — Z79818 Long term (current) use of other agents affecting estrogen receptors and estrogen levels: Secondary | ICD-10-CM | POA: Diagnosis not present

## 2019-03-25 DIAGNOSIS — D649 Anemia, unspecified: Secondary | ICD-10-CM | POA: Diagnosis not present

## 2019-03-25 DIAGNOSIS — R5381 Other malaise: Secondary | ICD-10-CM | POA: Diagnosis not present

## 2019-03-25 DIAGNOSIS — C61 Malignant neoplasm of prostate: Secondary | ICD-10-CM | POA: Insufficient documentation

## 2019-03-25 DIAGNOSIS — Z7982 Long term (current) use of aspirin: Secondary | ICD-10-CM | POA: Diagnosis not present

## 2019-03-25 DIAGNOSIS — E785 Hyperlipidemia, unspecified: Secondary | ICD-10-CM | POA: Insufficient documentation

## 2019-03-25 DIAGNOSIS — Z923 Personal history of irradiation: Secondary | ICD-10-CM | POA: Diagnosis not present

## 2019-03-25 DIAGNOSIS — R5383 Other fatigue: Secondary | ICD-10-CM | POA: Insufficient documentation

## 2019-03-25 DIAGNOSIS — Z79899 Other long term (current) drug therapy: Secondary | ICD-10-CM | POA: Insufficient documentation

## 2019-03-25 DIAGNOSIS — I129 Hypertensive chronic kidney disease with stage 1 through stage 4 chronic kidney disease, or unspecified chronic kidney disease: Secondary | ICD-10-CM | POA: Insufficient documentation

## 2019-03-25 DIAGNOSIS — Z87891 Personal history of nicotine dependence: Secondary | ICD-10-CM | POA: Insufficient documentation

## 2019-03-25 DIAGNOSIS — Z800 Family history of malignant neoplasm of digestive organs: Secondary | ICD-10-CM | POA: Diagnosis not present

## 2019-03-25 LAB — HEMATOCRIT: HCT: 32.5 % — ABNORMAL LOW (ref 39.0–52.0)

## 2019-03-25 LAB — HEMOGLOBIN: Hemoglobin: 9.5 g/dL — ABNORMAL LOW (ref 13.0–17.0)

## 2019-03-26 ENCOUNTER — Other Ambulatory Visit: Payer: Self-pay | Admitting: Internal Medicine

## 2019-03-26 ENCOUNTER — Inpatient Hospital Stay: Payer: Medicare PPO

## 2019-03-26 ENCOUNTER — Other Ambulatory Visit: Payer: Self-pay

## 2019-03-26 VITALS — BP 161/72 | HR 61

## 2019-03-26 DIAGNOSIS — I129 Hypertensive chronic kidney disease with stage 1 through stage 4 chronic kidney disease, or unspecified chronic kidney disease: Secondary | ICD-10-CM | POA: Diagnosis not present

## 2019-03-26 DIAGNOSIS — Z923 Personal history of irradiation: Secondary | ICD-10-CM | POA: Diagnosis not present

## 2019-03-26 DIAGNOSIS — Z79818 Long term (current) use of other agents affecting estrogen receptors and estrogen levels: Secondary | ICD-10-CM | POA: Diagnosis not present

## 2019-03-26 DIAGNOSIS — E785 Hyperlipidemia, unspecified: Secondary | ICD-10-CM | POA: Diagnosis not present

## 2019-03-26 DIAGNOSIS — C61 Malignant neoplasm of prostate: Secondary | ICD-10-CM | POA: Diagnosis not present

## 2019-03-26 DIAGNOSIS — R5383 Other fatigue: Secondary | ICD-10-CM | POA: Diagnosis not present

## 2019-03-26 DIAGNOSIS — N183 Chronic kidney disease, stage 3 unspecified: Secondary | ICD-10-CM | POA: Diagnosis not present

## 2019-03-26 DIAGNOSIS — R5381 Other malaise: Secondary | ICD-10-CM | POA: Diagnosis not present

## 2019-03-26 DIAGNOSIS — D649 Anemia, unspecified: Secondary | ICD-10-CM | POA: Diagnosis not present

## 2019-03-26 MED ORDER — ELIGARD 22.5 MG ~~LOC~~ KIT: 22.5000 mg | PACK | SUBCUTANEOUS | 3 refills | Status: DC

## 2019-03-26 MED ORDER — LEUPROLIDE ACETATE (3 MONTH) 22.5 MG ~~LOC~~ KIT
22.5000 mg | PACK | Freq: Once | SUBCUTANEOUS | Status: AC
  Administered 2019-03-26: 15:00:00 22.5 mg via SUBCUTANEOUS
  Filled 2019-03-26: qty 22.5

## 2019-03-26 MED ORDER — EPOETIN ALFA-EPBX 10000 UNIT/ML IJ SOLN
20000.0000 [IU] | Freq: Once | INTRAMUSCULAR | Status: AC
  Administered 2019-03-26: 20000 [IU] via SUBCUTANEOUS
  Filled 2019-03-26: qty 2

## 2019-03-26 MED ORDER — SODIUM CHLORIDE 0.9 % IV SOLN
Freq: Once | INTRAVENOUS | Status: DC
  Filled 2019-03-26: qty 250

## 2019-04-02 ENCOUNTER — Ambulatory Visit (INDEPENDENT_AMBULATORY_CARE_PROVIDER_SITE_OTHER): Payer: Medicare PPO | Admitting: Cardiovascular Disease

## 2019-04-02 ENCOUNTER — Encounter: Payer: Self-pay | Admitting: Cardiovascular Disease

## 2019-04-02 ENCOUNTER — Other Ambulatory Visit: Payer: Self-pay

## 2019-04-02 VITALS — BP 110/50 | HR 68 | Temp 97.2°F | Ht 69.0 in | Wt 149.0 lb

## 2019-04-02 DIAGNOSIS — E782 Mixed hyperlipidemia: Secondary | ICD-10-CM

## 2019-04-02 DIAGNOSIS — I2510 Atherosclerotic heart disease of native coronary artery without angina pectoris: Secondary | ICD-10-CM | POA: Diagnosis not present

## 2019-04-02 DIAGNOSIS — I10 Essential (primary) hypertension: Secondary | ICD-10-CM

## 2019-04-02 DIAGNOSIS — Z87891 Personal history of nicotine dependence: Secondary | ICD-10-CM

## 2019-04-02 DIAGNOSIS — I739 Peripheral vascular disease, unspecified: Secondary | ICD-10-CM

## 2019-04-02 NOTE — Assessment & Plan Note (Signed)
History of essential potential blood pressure measured at 110/50.  He is on lisinopril.

## 2019-04-02 NOTE — Assessment & Plan Note (Signed)
Three-vessel coronary calcification seen on chest CT April 2019 incidentally although the patient denies symptoms.

## 2019-04-02 NOTE — Assessment & Plan Note (Signed)
History of 40 pack years of tobacco use having quit 1995

## 2019-04-02 NOTE — Patient Instructions (Signed)

## 2019-04-02 NOTE — Progress Notes (Signed)
04/02/2019 Thomas Lopez   03-24-1933  [id number]  Primary Physician Thomas Bush, MD Primary Cardiologist: Thomas Harp MD Thomas Lopez, [place]  HPI:  Thomas Lopez is a 85 y.o. thin appearing single African-American male father of 5 children, grandfather of 3 grandchildren referred by Dr. Danise Lopez for peripheral artery disease evaluation and treatment.  He is a retired Furniture conservator/restorer.  His risk factors include 40 pack years of tobacco use having quit 1995, treated hypertension and hyperlipidemia.  There is no family history.  Never had heart attack or stroke.  Denies chest pain or shortness of breath.  Specifically denies claudication.  He had Doppler studies performed 03/14/2019 revealing a right ABI of 0.81 and a left of 0.95.  There was diffuse calcification noted.  He had a high-frequency signal in his right common iliac artery, monophasic waveforms in his distal right SFA popliteal and tibial vessels as well as monophasic waveforms in his left SFA popliteal and tibial vessels.  There is no evidence of critical limb ischemia.   Current Meds  Medication Sig  . aspirin EC 81 MG tablet Take 81 mg by mouth daily.  . Cholecalciferol (VITAMIN D3) 25 MCG (1000 UT) CAPS Take 1 capsule (1,000 Units total) by mouth daily.  . dorzolamide-timolol (COSOPT) 22.3-6.8 MG/ML ophthalmic solution Place 1 drop into the left eye 2 (two) times daily.  . ferrous sulfate 325 (65 FE) MG tablet Take 1 tablet (325 mg total) by mouth daily with breakfast.  . leuprolide (LUPRON) 22.5 MG injection Inject 22.5 mg into the muscle every 3 (three) months.  . Leuprolide Acetate, 3 Month, (ELIGARD) 22.5 MG injection Inject 22.5 mg into the skin every 3 (three) months.  Marland Kitchen lisinopril (ZESTRIL) 5 MG tablet TAKE 1 TABLET EVERY DAY  . LUMIGAN 0.01 % SOLN   . methylcellulose oral powder Take by mouth daily.  Marland Kitchen omeprazole (PRILOSEC) 40 MG capsule TAKE 1 CAPSULE EVERY DAY  . rosuvastatin (CRESTOR) 10 MG tablet TAKE  1 TABLET EVERY DAY  . tamsulosin (FLOMAX) 0.4 MG CAPS capsule Take 0.4 mg by mouth. After supper  . timolol (TIMOPTIC) 0.5 % ophthalmic solution Place 1 drop into the left eye 2 (two) times daily.   . trospium (SANCTURA) 20 MG tablet Take 1 tablet (20 mg total) by mouth at bedtime.  Marland Kitchen venlafaxine XR (EFFEXOR-XR) 37.5 MG 24 hr capsule TAKE 1 CAPSULE EVERY DAY WITH BREAKFAST     No Known Allergies  Social History   Socioeconomic History  . Marital status: Married    Spouse name: Not on file  . Number of children: 5  . Years of education: Not on file  . Highest education level: Not on file  Occupational History  . Occupation: Retired  Scientific laboratory technician  . Financial resource strain: Not on file  . Food insecurity    Worry: Not on file    Inability: Not on file  . Transportation needs    Medical: Not on file    Non-medical: Not on file  Tobacco Use  . Smoking status: Former Smoker    Quit date: 05/23/1993    Years since quitting: 25.8  . Smokeless tobacco: Never Used  Substance and Sexual Activity  . Alcohol use: Yes    Comment: Rare  . Drug use: No  . Sexual activity: Yes  Lifestyle  . Physical activity    Days per week: Not on file    Minutes per session: Not on file  . Stress:  Not on file  Relationships  . Social Herbalist on phone: Not on file    Gets together: Not on file    Attends religious service: Not on file    Active member of club or organization: Not on file    Attends meetings of clubs or organizations: Not on file    Relationship status: Not on file  . Intimate partner violence    Fear of current or ex partner: Not on file    Emotionally abused: Not on file    Physically abused: Not on file    Forced sexual activity: Not on file  Other Topics Concern  . 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  Cardiovascular: negative for chest pain, dyspnea on exertion, edema, orthopnea, palpitations, paroxysmal nocturnal dyspnea or shortness of breath Dermatological: negative for rash Respiratory: negative for cough or wheezing Urologic: negative for hematuria Abdominal: negative for nausea, vomiting, diarrhea, bright red blood per rectum, melena, or hematemesis Neurologic: negative for visual changes, syncope, or dizziness All other systems reviewed and are otherwise negative except as noted above.    Blood pressure (!) 110/50, pulse 68, temperature (!) 97.2 F (36.2 C), height 5\' 9"  (1.753 m), weight 149 lb (67.6 kg).  General appearance: alert and no distress Neck: no adenopathy, no carotid bruit, no JVD, supple, symmetrical, trachea midline and thyroid not enlarged, symmetric, no tenderness/mass/nodules Lungs: clear to auscultation bilaterally Heart: regular rate and rhythm, S1, S2 normal, no murmur, click, rub or gallop Extremities: extremities normal, atraumatic, no cyanosis or edema Pulses: Diminished pedal pulses bilaterally Skin: Skin color, texture, turgor normal. No rashes or lesions Neurologic: Alert and oriented X 3, normal strength and tone. Normal symmetric reflexes. Normal coordination and gait  EKG sinus rhythm at 68 without ST or T wave changes.  I personally reviewed this EKG.  ASSESSMENT AND PLAN:   PAD (peripheral artery disease) [facility]) Mr. Cartwright was referred by Dr. Danise Lopez for PAD.  He has risk factors include tobacco abuse, hyperlipidemia and hypertension.  He really denies claudication.  His lower extremity arterial Doppler studies performed 03/14/2019 revealed a right ABI of 1.81 and a left of 0.95.  He did have a high-frequency signal of his proximal right common iliac monophasic waveforms in his SFA and tibial vessels with monophasic  waveforms in his left SFA and tibial vessels as well.  There is no evidence of critical ischemia.  He does some activity including gardening and milling his yard although he uses a riding lawnmower specifically denies claudication.  This point, given his age, and lack of symptoms, I do not feel further evaluation is warranted at this time.  Mixed hyperlipidemia History of hyperlipidemia on statin therapy lipid profile performed 11/13/2018 revealed a total cholesterol of 151, LDL 79 HDL 41.  Essential hypertension History of essential potential blood pressure measured at 110/50.  He is on lisinopril.  History of smoking History of 40 pack years of tobacco use having quit 1995  Coronary artery calcification seen on computed tomography Three-vessel coronary calcification seen on chest CT April 2019 incidentally although the patient denies symptoms.      Thomas Harp MD FACP,FACC,FAHA, [facility] 04/02/2019 2:52 PM

## 2019-04-02 NOTE — Assessment & Plan Note (Addendum)
Thomas Lopez was referred by Dr. Danise Mina for PAD.  He has risk factors include tobacco abuse, hyperlipidemia and hypertension.  He really denies claudication.  His lower extremity arterial Doppler studies performed 03/14/2019 revealed a right ABI of 1.81 and a left of 0.95.  He did have a high-frequency signal of his proximal right common iliac monophasic waveforms in his SFA and tibial vessels with monophasic waveforms in his left SFA and tibial vessels as well.  There is no evidence of critical ischemia.  He does some activity including gardening and milling his yard although he uses a riding lawnmower specifically denies claudication.  This point, given his age, and lack of symptoms, I do not feel further evaluation is warranted at this time.

## 2019-04-02 NOTE — Assessment & Plan Note (Signed)
History of hyperlipidemia on statin therapy lipid profile performed 11/13/2018 revealed a total cholesterol of 151, LDL 79 HDL 41.

## 2019-04-22 ENCOUNTER — Other Ambulatory Visit: Payer: Self-pay

## 2019-04-22 ENCOUNTER — Inpatient Hospital Stay (HOSPITAL_BASED_OUTPATIENT_CLINIC_OR_DEPARTMENT_OTHER): Payer: Medicare PPO | Admitting: Internal Medicine

## 2019-04-22 ENCOUNTER — Inpatient Hospital Stay: Payer: Medicare PPO

## 2019-04-22 DIAGNOSIS — I129 Hypertensive chronic kidney disease with stage 1 through stage 4 chronic kidney disease, or unspecified chronic kidney disease: Secondary | ICD-10-CM | POA: Diagnosis not present

## 2019-04-22 DIAGNOSIS — Z923 Personal history of irradiation: Secondary | ICD-10-CM | POA: Diagnosis not present

## 2019-04-22 DIAGNOSIS — D649 Anemia, unspecified: Secondary | ICD-10-CM | POA: Diagnosis not present

## 2019-04-22 DIAGNOSIS — C61 Malignant neoplasm of prostate: Secondary | ICD-10-CM | POA: Diagnosis not present

## 2019-04-22 DIAGNOSIS — Z79818 Long term (current) use of other agents affecting estrogen receptors and estrogen levels: Secondary | ICD-10-CM | POA: Diagnosis not present

## 2019-04-22 DIAGNOSIS — N183 Chronic kidney disease, stage 3 unspecified: Secondary | ICD-10-CM | POA: Diagnosis not present

## 2019-04-22 DIAGNOSIS — R5381 Other malaise: Secondary | ICD-10-CM | POA: Diagnosis not present

## 2019-04-22 DIAGNOSIS — D631 Anemia in chronic kidney disease: Secondary | ICD-10-CM

## 2019-04-22 DIAGNOSIS — N189 Chronic kidney disease, unspecified: Secondary | ICD-10-CM

## 2019-04-22 DIAGNOSIS — E785 Hyperlipidemia, unspecified: Secondary | ICD-10-CM | POA: Diagnosis not present

## 2019-04-22 DIAGNOSIS — R5383 Other fatigue: Secondary | ICD-10-CM | POA: Diagnosis not present

## 2019-04-22 LAB — HEMATOCRIT: HCT: 33.1 % — ABNORMAL LOW (ref 39.0–52.0)

## 2019-04-22 LAB — HEMOGLOBIN: Hemoglobin: 9.5 g/dL — ABNORMAL LOW (ref 13.0–17.0)

## 2019-04-22 NOTE — Assessment & Plan Note (Addendum)
#  Normocytic anemia-unclear etiology; status post bone marrow biopsy/NGS normal.?-related to chronic disease/? CKD.  #Hemoglobin -9.5 improved status post IV Venofer.  Proceed with Retacrit tomorrow  #CKD stage III creatinine 1.3-stable bilateral hydro-nephrosis /hydroureter -followed closely by urology.  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 awaiting PSA from today. Recommend bone scan in 2 months. Can consider Apalutamide-if PSA continues to rise.   # DISPOSITION: [D-2 retacrit/insurance] # retacrit tomorrow # labs- H&H in 1 month week; d-2 retacrit # follow up in 2 month- MD clinic-labs- cbc/bmp/ D-2 retacrit;Bone scan prior- Dr.B

## 2019-04-22 NOTE — Progress Notes (Signed)
[place] OFFICE PROGRESS NOTE  Patient Care Team: [name], MD as PCP - General (Family Medicine) Bond, Tracie Harrier, MD as Consulting Physician (Ophthalmology) Abbie Sons, MD as Consulting Physician (Urology)  Cancer Staging No matching staging information was found for the patient.   Oncology History Overview Note  # external beam radiation therapy and radiation seed implant in 2005 with Dr. Joelyn Oms and Dr. Danny Lawless for T1c Gleason 3+4 = 7 prostate cancer with a pretreatment PSA of 6.8. In November 2015, he was found to have biochemical recurrence.  Dec 2016- CT a/p & Bone scan- NEG; PSA- 11; June 2017- 19; STARTED on Firmagon [Dr.Budzyn]; on Lupron q 74M  # OCT 2018- Anemia-? Etiology; Likely anemia of chronic disease [anti-CCP elevated/ but NO RA; rheumatology; ]BMBx- hypercellular 30%; relative myeloid hyperplasia/erythroid hypoplasia; FISH- 5/7/8-Normal. II [facility]- NEG  # CKD- III; Bladder neck obstruction s/p Foley [Dr.Stoiff]  # JAN 2018- Dexa scan- N ------------------------------------   DIAGNOSIS: PROSTATE CANCER  STAGE:  IV       ;GOALS: control  CURRENT/MOST RECENT THERAPY: Lupron    Primary adenocarcinoma of prostate ([place])      INTERVAL HISTORY:  Thomas Lopez 85 y.o.  male pleasant patient above history of prostate cancer on surveillance; and anemia of unclear etiology/ ? CKD currently on Retacrit is here for follow-up.  Patient appetite is good.  No weight loss.  Denies any nausea vomiting no blood in stools black or stools.  Denies any worsening shortness of breath or cough.  Chronic joint pains not any worse.  Review of Systems  Constitutional: Positive for malaise/fatigue. Negative for chills, diaphoresis and fever.  HENT: Negative for nosebleeds and sore throat.   Eyes: Negative for double vision.  Respiratory: Positive for shortness of breath. Negative for cough, hemoptysis, sputum production  and wheezing.   Cardiovascular: Negative for chest pain, palpitations, orthopnea and leg swelling.  Gastrointestinal: Negative for abdominal pain, blood in stool, constipation, diarrhea, heartburn, melena, nausea and vomiting.  Genitourinary: Negative for dysuria, frequency and urgency.  Musculoskeletal: Positive for joint pain. Negative for back pain.  Skin: Negative.  Negative for itching and rash.  Neurological: Negative for dizziness, tingling, focal weakness, weakness and headaches.  Endo/Heme/Allergies: Does not bruise/bleed easily.  Psychiatric/Behavioral: Negative for depression. The patient is not nervous/anxious and does not have insomnia.       PAST MEDICAL HISTORY :  Past Medical History:  Diagnosis Date  . Aorto-iliac atherosclerosis ([place]) 04/2015   by CT scan  . Carotid stenosis 11/2014   mild bilateral 1-39%, rpt 2 yrs  . Colon polyps    rpt colonoscopy due 2014  . ED (erectile dysfunction)    Trimix and VED  . Esophagitis   . Essential hypertension 06/29/2009  . GERD (gastroesophageal reflux disease)   . Glaucoma    severe ([place])  . HLD (hyperlipidemia)   . HTN (hypertension)   . Hyperglycemia   . IBS (irritable bowel syndrome)   . Prostate cancer [facility]) 2005   s/p seed implant, EBRT ([facility]. Alinda [facility] at [facility]) recurrent treating with androgen deprivation referred to cancer center  . Smoking history     PAST SURGICAL HISTORY :   Past Surgical History:  Procedure Laterality Date  . CATARACT EXTRACTION Left 05/2017   Dr. Edilia Bo  . CATARACT EXTRACTION, BILATERAL  December 2016   [place], Dr. Raynelle Fanning  . COLONOSCOPY  03/01/01   Multiple polyps, divertic//adenomatous polyps  .  COLONOSCOPY  04/24/01   multiple polyps  . COLONOSCOPY  10/23/01   Polyps benign-repeat every 2 years  . COLONOSCOPY  06/23/04   Adenom/hyperplastic polyps; multiple external hemorrhoids  . COLONOSCOPY  06/08/07   single small polyp-repeat 5 years  . CYSTOSCOPY WITH INSERTION  OF UROLIFT N/A 04/10/2018   Procedure: CYSTOSCOPY WITH INSERTION OF UROLIFT;  Surgeon: Abbie Sons, MD;  Location: [facility] ORS;  Service: Urology;  Laterality: N/A;  . ESOPHAGOGASTRODUODENOSCOPY  03/01/01   H.H.; gastritis esoph. dudodenitis  . NCS/Wrists Left Carpal  2/02   Min./right improved  . PFTs  10/2012   FVC 64%, FEV1 41%, ratio 0.62  . PROSTATE BIOPSY  12/04   positive; radioactive seed implant (Dr. Joelyn Oms)  . Prostate Ext Beam Radiation  1/27-07/23/03   Dr. Danny Lawless  . SPIROMETRY  09/2011   WNL  . TONSILLECTOMY    . Wrist Release  6/98; 9/99   Right    FAMILY HISTORY :   Family History  Problem Relation Age of Onset  . Cancer Mother        colon  . Hyperlipidemia Mother   . Other Mother        Carotid disease  . Coronary artery disease Neg Hx   . Stroke Neg Hx     SOCIAL HISTORY:   Social History   Tobacco Use  . Smoking status: Former Smoker    Quit date: 05/23/1993    Years since quitting: 25.9  . Smokeless tobacco: Never Used  Substance Use Topics  . Alcohol use: Yes    Comment: Rare  . Drug use: No    ALLERGIES:  has No Known Allergies.  MEDICATIONS:  Current Outpatient Medications  Medication Sig Dispense Refill  . aspirin EC 81 MG tablet Take 81 mg by mouth daily.    . Cholecalciferol (VITAMIN D3) 25 MCG (1000 UT) CAPS Take 1 capsule (1,000 Units total) by mouth daily. 30 capsule   . dorzolamide-timolol (COSOPT) 22.3-6.8 MG/ML ophthalmic solution Place 1 drop into the left eye 2 (two) times daily.    . ferrous sulfate 325 (65 FE) MG tablet Take 1 tablet (325 mg total) by mouth daily with breakfast.  3  . leuprolide (LUPRON) 22.5 MG injection Inject 22.5 mg into the muscle every 3 (three) months.    . Leuprolide Acetate, 3 Month, (ELIGARD) 22.5 MG injection Inject 22.5 mg into the skin every 3 (three) months. 1 each 3  . lisinopril (ZESTRIL) 5 MG tablet TAKE 1 TABLET EVERY DAY 90 tablet 3  . LUMIGAN 0.01 % SOLN     . methylcellulose oral powder  Take by mouth daily.    Marland Kitchen omeprazole (PRILOSEC) 40 MG capsule TAKE 1 CAPSULE EVERY DAY 90 capsule 1  . rosuvastatin (CRESTOR) 10 MG tablet TAKE 1 TABLET EVERY DAY 90 tablet 1  . tamsulosin (FLOMAX) 0.4 MG CAPS capsule Take 0.4 mg by mouth. After supper    . timolol (TIMOPTIC) 0.5 % ophthalmic solution Place 1 drop into the left eye 2 (two) times daily.     . trospium (SANCTURA) 20 MG tablet Take 1 tablet (20 mg total) by mouth at bedtime. 30 tablet 0  . venlafaxine XR (EFFEXOR-XR) 37.5 MG 24 hr capsule TAKE 1 CAPSULE EVERY DAY WITH BREAKFAST 90 capsule 1   No current facility-administered medications for this visit.     PHYSICAL EXAMINATION: ECOG PERFORMANCE STATUS: 1 - Symptomatic but completely ambulatory  BP (!) 157/56 (BP Location: Left Arm, Patient Position: Sitting,  Cuff Size: Normal)   Pulse 62   Temp 97.6 F (36.4 C)   Wt 149 lb 6 oz (67.8 kg)   BMI 22.06 kg/m   Filed Weights   04/22/19 1516  Weight: 149 lb 6 oz (67.8 kg)    Physical Exam  Constitutional: He is oriented to person, place, and time.  Thin built cachectic appearing male patient .  He is alone.  Walking by himself.  Appears pale.  HENT:  Head: Normocephalic and atraumatic.  Mouth/Throat: Oropharynx is clear and moist. No oropharyngeal exudate.  Eyes: Pupils are equal, round, and reactive to light.  Neck: Normal range of motion. Neck supple.  Cardiovascular: Normal rate and regular rhythm.  Pulmonary/Chest: No respiratory distress. He has no wheezes.  Abdominal: Soft. Bowel sounds are normal. He exhibits no distension and no mass. There is no abdominal tenderness. There is no rebound and no guarding.  Musculoskeletal: Normal range of motion.        General: No tenderness or edema.  Neurological: He is alert and oriented to person, place, and time.  Skin: Skin is warm.  Psychiatric: Affect normal.   LABORATORY DATA:  I have reviewed the data as listed    Component Value Date/Time   NA 145 02/25/2019  1432   K 3.9 02/25/2019 1432   CL 109 02/25/2019 1432   CO2 26 02/25/2019 1432   GLUCOSE 100 (H) 02/25/2019 1432   BUN 37 (H) 02/25/2019 1432   CREATININE 1.26 (H) 02/25/2019 1432   CALCIUM 9.1 02/25/2019 1432   PROT 7.7 10/16/2018 0957   ALBUMIN 4.1 11/13/2018 0828   AST 19 10/16/2018 0957   ALT 12 10/16/2018 0957   ALKPHOS 63 10/16/2018 0957   BILITOT 0.4 10/16/2018 0957   GFRNONAA 52 (L) 02/25/2019 1432   GFRAA >60 02/25/2019 1432    No results found for: SPEP, UPEP  Lab Results  Component Value Date   WBC 6.7 02/25/2019   NEUTROABS 5.1 02/25/2019   HGB 9.5 (L) 04/22/2019   HCT 33.1 (L) 04/22/2019   MCV 86.5 02/25/2019   PLT 246 02/25/2019      Chemistry      Component Value Date/Time   NA 145 02/25/2019 1432   K 3.9 02/25/2019 1432   CL 109 02/25/2019 1432   CO2 26 02/25/2019 1432   BUN 37 (H) 02/25/2019 1432   CREATININE 1.26 (H) 02/25/2019 1432      Component Value Date/Time   CALCIUM 9.1 02/25/2019 1432   ALKPHOS 63 10/16/2018 0957   AST 19 10/16/2018 0957   ALT 12 10/16/2018 0957   BILITOT 0.4 10/16/2018 0957       RADIOGRAPHIC STUDIES: I have personally reviewed the radiological images as listed and agreed with the findings in the report. No results found.   ASSESSMENT & PLAN:  Normocytic anemia #Normocytic anemia-unclear etiology; status post bone marrow biopsy/NGS normal.?-related to chronic disease/? CKD.  #Hemoglobin -9.5 improved status post IV Venofer.  Proceed with Retacrit tomorrow  #CKD stage III creatinine 1.3-stable bilateral hydro-nephrosis /hydroureter -followed closely by urology.  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 awaiting PSA from today. Recommend bone scan in 2 months. Can consider Apalutamide-if PSA continues to rise.   # DISPOSITION: [D-2 retacrit/insurance] # retacrit tomorrow # labs- H&H in 1 month week; d-2 retacrit # follow  up in 2 month- MD clinic-labs- cbc/bmp/ D-2 retacrit;Bone scan prior- Dr.B    Orders Placed This Encounter  Procedures  . NM Bone Scan Whole Body    Standing Status:   Future    Standing Expiration Date:   04/21/2020    Order Specific Question:   If indicated for the ordered procedure, I authorize the administration of a radiopharmaceutical per Radiology protocol    Answer:   Yes    Order Specific Question:   Preferred imaging location?    Answer:   [facility]    Order Specific Question:   Radiology Contrast Protocol - do NOT remove file path    Answer:   [email or web address]    Order Specific Question:   ** REASON FOR EXAM (FREE TEXT)    Answer:   prostate cancer   All questions were answered. The patient knows to call the clinic with any problems, questions or concerns.      Cammie Sickle, MD 04/22/2019 4:28 PM

## 2019-04-23 ENCOUNTER — Inpatient Hospital Stay: Payer: Medicare PPO | Attending: Internal Medicine

## 2019-04-23 ENCOUNTER — Other Ambulatory Visit: Payer: Self-pay

## 2019-04-23 VITALS — BP 148/71

## 2019-04-23 DIAGNOSIS — D649 Anemia, unspecified: Secondary | ICD-10-CM | POA: Diagnosis not present

## 2019-04-23 DIAGNOSIS — C61 Malignant neoplasm of prostate: Secondary | ICD-10-CM | POA: Insufficient documentation

## 2019-04-23 DIAGNOSIS — Z79818 Long term (current) use of other agents affecting estrogen receptors and estrogen levels: Secondary | ICD-10-CM | POA: Insufficient documentation

## 2019-04-23 MED ORDER — EPOETIN ALFA-EPBX 10000 UNIT/ML IJ SOLN
20000.0000 [IU] | Freq: Once | INTRAMUSCULAR | Status: AC
  Administered 2019-04-23: 20000 [IU] via SUBCUTANEOUS
  Filled 2019-04-23: qty 2

## 2019-05-06 DIAGNOSIS — H401133 Primary open-angle glaucoma, bilateral, severe stage: Secondary | ICD-10-CM | POA: Diagnosis not present

## 2019-05-15 ENCOUNTER — Other Ambulatory Visit: Payer: Self-pay

## 2019-05-15 ENCOUNTER — Encounter: Payer: Self-pay | Admitting: Family Medicine

## 2019-05-15 ENCOUNTER — Ambulatory Visit (INDEPENDENT_AMBULATORY_CARE_PROVIDER_SITE_OTHER): Payer: Medicare PPO | Admitting: Family Medicine

## 2019-05-15 VITALS — BP 158/70 | HR 60 | Temp 97.8°F | Ht 69.0 in | Wt 147.5 lb

## 2019-05-15 DIAGNOSIS — N1831 Chronic kidney disease, stage 3a: Secondary | ICD-10-CM

## 2019-05-15 DIAGNOSIS — I739 Peripheral vascular disease, unspecified: Secondary | ICD-10-CM

## 2019-05-15 DIAGNOSIS — C61 Malignant neoplasm of prostate: Secondary | ICD-10-CM

## 2019-05-15 DIAGNOSIS — I10 Essential (primary) hypertension: Secondary | ICD-10-CM | POA: Diagnosis not present

## 2019-05-15 DIAGNOSIS — D649 Anemia, unspecified: Secondary | ICD-10-CM

## 2019-05-15 MED ORDER — LISINOPRIL 10 MG PO TABS: 10.0000 mg | ORAL_TABLET | Freq: Every day | ORAL | 3 refills | Status: DC

## 2019-05-15 NOTE — Assessment & Plan Note (Signed)
Stable period. Sees onc and urology.

## 2019-05-15 NOTE — Patient Instructions (Addendum)
Blood pressures are staying a bit elevated - increase lisinopril to 10mg  daily. New dose sent to pharmacy.  Will want to check kidney function 1 week after increasing the dose  Good to see you today.  Return in 6 months for physical/wellness visit.

## 2019-05-15 NOTE — Progress Notes (Signed)
This visit was conducted in person.  BP (!) 158/70 (BP Location: Right Arm, Patient Position: Sitting, Cuff Size: Normal)   Pulse 60   Temp 97.8 F (36.6 C) (Temporal)   Ht _0  (1.753 m)   Wt 147 lb 8 oz (66.9 kg)   SpO2 100%   BMI 21.78 kg/m   On repeat, 176/76  CC: 6 mo f/u visit  Subjective:    Patient ID: Thomas Lopez, male    DOB: [birth year], 85 y.o.   MRN: [id number]  HPI: Thomas Lopez is a 85 y.o. male presenting on 05/15/2019 for Follow-up (Here for 6 mo f/u.) and Immunizations (Wants to discuss COVID vaccine. )   HTN - Compliant with current antihypertensive regimen of lisinopril 70m daily. Does check blood pressures at home but not frequently: last checked a few weeks ago - and good. No low blood pressure readings or symptoms of dizziness/syncope. Denies HA, vision changes, CP/tightness, SOB, leg swelling.    Ongoing normocytic anemia of unclear etiology s/p bone marrow biopsy followed by onc, has received IV venofer and retacrit. Also with metastatic prostate cancer on lupron. Weight stable. Nadir was 135 lbs.   CKD stage 3 with stable B hydronephrosis and hydroureter followed by urology. He did have urolift procedure.   PAD s/p eval by Dr BGwenlyn Found- rec medical management at this time.   Saw rheum 2019 with mildly positive anti-CCP (Meda Coffee, unclear if he ever followed up.   [facility]    Relevant past medical, surgical, family and social history reviewed and updated as indicated. Interim medical history since our last visit reviewed. Allergies and medications reviewed and updated. Outpatient Medications Prior to Visit  Medication Sig Dispense Refill  . aspirin EC 81 MG tablet Take 81 mg by mouth daily.    . Cholecalciferol (VITAMIN D3) 25 MCG (1000 UT) CAPS Take 1 capsule (1,000 Units total) by mouth daily. 30 capsule   . dorzolamide-timolol (COSOPT) 22.3-6.8 MG/ML ophthalmic solution Place 1 drop into the left eye 2 (two) times  daily.    . ferrous sulfate 325 (65 FE) MG tablet Take 1 tablet (325 mg total) by mouth daily with breakfast.  3  . leuprolide (LUPRON) 22.5 MG injection Inject 22.5 mg into the muscle every 3 (three) months.    . Leuprolide Acetate, 3 Month, (ELIGARD) 22.5 MG injection Inject 22.5 mg into the skin every 3 (three) months. 1 each 3  . LUMIGAN 0.01 % SOLN     . methylcellulose oral powder Take by mouth daily.    .Marland Kitchenomeprazole (PRILOSEC) 40 MG capsule TAKE 1 CAPSULE EVERY DAY 90 capsule 1  . pilocarpine (PILOCAR) 1 % ophthalmic solution Place 1 drop into the left eye 3 (three) times daily.    . rosuvastatin (CRESTOR) 10 MG tablet TAKE 1 TABLET EVERY DAY 90 tablet 1  . tamsulosin (FLOMAX) 0.4 MG CAPS capsule Take 0.4 mg by mouth. After supper    . timolol (TIMOPTIC) 0.5 % ophthalmic solution Place 1 drop into the left eye 2 (two) times daily.     . trospium (SANCTURA) 20 MG tablet Take 1 tablet (20 mg total) by mouth at bedtime. 30 tablet 0  . venlafaxine XR (EFFEXOR-XR) 37.5 MG 24 hr capsule TAKE 1 CAPSULE EVERY DAY WITH BREAKFAST 90 capsule 1  . lisinopril (ZESTRIL) 5 MG tablet TAKE 1 TABLET EVERY DAY 90 tablet 3   No facility-administered medications prior to visit.  Per HPI unless specifically indicated in ROS section below Review of Systems Objective:    BP (!) 158/70 (BP Location: Right Arm, Patient Position: Sitting, Cuff Size: Normal)   Pulse 60   Temp 97.8 F (36.6 C) (Temporal)   Ht _0  (1.753 m)   Wt 147 lb 8 oz (66.9 kg)   SpO2 100%   BMI 21.78 kg/m   Wt Readings from Last 3 Encounters:  05/15/19 147 lb 8 oz (66.9 kg)  04/22/19 149 lb 6 oz (67.8 kg)  04/02/19 149 lb (67.6 kg)    Physical Exam Vitals and nursing note reviewed.  Constitutional:      Appearance: Normal appearance. He is not ill-appearing.  Eyes:     Extraocular Movements: Extraocular movements intact.     Pupils: Pupils are equal, round, and reactive to light.  Cardiovascular:     Rate and Rhythm:  Normal rate and regular rhythm.     Pulses: Normal pulses.     Heart sounds: Normal heart sounds. No murmur.  Pulmonary:     Effort: Pulmonary effort is normal. No respiratory distress.     Breath sounds: Normal breath sounds. No wheezing, rhonchi or rales.  Neurological:     Mental Status: He is alert.  Psychiatric:        Mood and Affect: Mood normal.        Behavior: Behavior normal.       Results for orders placed or performed in visit on 04/22/19  Hemoglobin [facility])  Result Value Ref Range   Hemoglobin 9.5 (L) 13.0 - 17.0 g/dL  Hematocrit [facility])  Result Value Ref Range   HCT 33.1 (L) 39.0 - 52.0 %   Assessment & Plan:  This visit occurred during the SARS-CoV-2 public health emergency.  Safety protocols were in place, including screening questions prior to the visit, additional usage of staff PPE, and extensive cleaning of exam room while observing appropriate contact time as indicated for disinfecting solutions.   Problem List Items Addressed This Visit    Primary adenocarcinoma of prostate ([place])    Stable period. Sees onc and urology.       PAD (peripheral artery disease) ([place])    Appreciate cards eval - rec medical management.       Relevant Medications   lisinopril (ZESTRIL) 10 MG tablet   Normocytic anemia    Appreciate heme care.       Essential hypertension - Primary    Chronic, deteriorated. Will increase lisinopril and I asked him to start monitoring BP more regularly at home. rec Cr check 1 wk after ACEI change.       Relevant Medications   lisinopril (ZESTRIL) 10 MG tablet   CKD (chronic kidney disease) stage 3, GFR 30-59 ml/min    Stable period.           Meds ordered this encounter  Medications  . lisinopril (ZESTRIL) 10 MG tablet    Sig: Take 1 tablet (10 mg total) by mouth daily.    Dispense:  90 tablet    Refill:  3    Note new dose   No orders of the defined types were placed in this encounter.  Patient Instructions  Blood pressures  are staying a bit elevated - increase lisinopril to 98m daily. New dose sent to pharmacy.  Will want to check kidney function 1 week after increasing the dose  Good to see you today.  Return in 6 months for physical/wellness visit.  Follow up plan: Return in about 6 months (around 11/13/2019) for annual exam, prior fasting for blood work, medicare wellness visit.  [name], MD

## 2019-05-15 NOTE — Assessment & Plan Note (Signed)
Stable period.  

## 2019-05-15 NOTE — Assessment & Plan Note (Signed)
Chronic, deteriorated. Will increase lisinopril and I asked him to start monitoring BP more regularly at home. rec Cr check 1 wk after ACEI change.

## 2019-05-15 NOTE — Assessment & Plan Note (Signed)
Appreciate heme care.  

## 2019-05-15 NOTE — Assessment & Plan Note (Signed)
Appreciate cards eval - rec medical management.

## 2019-05-20 ENCOUNTER — Other Ambulatory Visit: Payer: Self-pay

## 2019-05-20 ENCOUNTER — Other Ambulatory Visit: Payer: Self-pay | Admitting: *Deleted

## 2019-05-20 ENCOUNTER — Inpatient Hospital Stay: Payer: Medicare PPO

## 2019-05-20 DIAGNOSIS — D631 Anemia in chronic kidney disease: Secondary | ICD-10-CM

## 2019-05-20 DIAGNOSIS — C61 Malignant neoplasm of prostate: Secondary | ICD-10-CM | POA: Diagnosis not present

## 2019-05-20 DIAGNOSIS — N189 Chronic kidney disease, unspecified: Secondary | ICD-10-CM

## 2019-05-20 DIAGNOSIS — Z79818 Long term (current) use of other agents affecting estrogen receptors and estrogen levels: Secondary | ICD-10-CM | POA: Diagnosis not present

## 2019-05-20 DIAGNOSIS — D649 Anemia, unspecified: Secondary | ICD-10-CM | POA: Diagnosis not present

## 2019-05-20 LAB — HEMATOCRIT: HCT: 32.4 % — ABNORMAL LOW (ref 39.0–52.0)

## 2019-05-20 LAB — HEMOGLOBIN: Hemoglobin: 9.2 g/dL — ABNORMAL LOW (ref 13.0–17.0)

## 2019-05-21 ENCOUNTER — Other Ambulatory Visit: Payer: Self-pay

## 2019-05-21 ENCOUNTER — Inpatient Hospital Stay: Payer: Medicare PPO

## 2019-05-21 VITALS — BP 131/67

## 2019-05-21 DIAGNOSIS — C61 Malignant neoplasm of prostate: Secondary | ICD-10-CM

## 2019-05-21 DIAGNOSIS — Z79818 Long term (current) use of other agents affecting estrogen receptors and estrogen levels: Secondary | ICD-10-CM | POA: Diagnosis not present

## 2019-05-21 DIAGNOSIS — D649 Anemia, unspecified: Secondary | ICD-10-CM | POA: Diagnosis not present

## 2019-05-21 MED ORDER — EPOETIN ALFA-EPBX 10000 UNIT/ML IJ SOLN
20000.0000 [IU] | Freq: Once | INTRAMUSCULAR | Status: AC
  Administered 2019-05-21: 16:00:00 20000 [IU] via SUBCUTANEOUS
  Filled 2019-05-21: qty 2

## 2019-06-01 ENCOUNTER — Other Ambulatory Visit: Payer: Self-pay | Admitting: Urology

## 2019-06-17 ENCOUNTER — Other Ambulatory Visit: Payer: Self-pay

## 2019-06-17 ENCOUNTER — Ambulatory Visit: Payer: Medicare PPO | Admitting: Internal Medicine

## 2019-06-17 ENCOUNTER — Ambulatory Visit
Admission: RE | Admit: 2019-06-17 | Discharge: 2019-06-17 | Disposition: A | Discharge status: Home / Self Care | Payer: Medicare PPO | Source: Ambulatory Visit | Attending: Internal Medicine | Admitting: Internal Medicine

## 2019-06-17 ENCOUNTER — Encounter
Admission: RE | Admit: 2019-06-17 | Discharge: 2019-06-17 | Disposition: A | Discharge status: Home / Self Care | Payer: Medicare PPO | Source: Ambulatory Visit | Attending: Internal Medicine | Admitting: Internal Medicine

## 2019-06-17 ENCOUNTER — Other Ambulatory Visit: Payer: Medicare PPO

## 2019-06-17 DIAGNOSIS — D649 Anemia, unspecified: Secondary | ICD-10-CM | POA: Diagnosis not present

## 2019-06-17 MED ORDER — TECHNETIUM TC 99M MEDRONATE IV KIT
21.8600 | PACK | Freq: Once | INTRAVENOUS | Status: AC | PRN
  Administered 2019-06-17: 21.86 via INTRAVENOUS

## 2019-06-17 NOTE — Progress Notes (Signed)
Patient pre screened for office appointment, no questions or concerns today. Patient reminded of upcoming appointment time and date. 

## 2019-06-18 ENCOUNTER — Telehealth: Payer: Self-pay | Admitting: Pharmacy Technician

## 2019-06-18 ENCOUNTER — Inpatient Hospital Stay: Payer: Medicare PPO | Attending: Internal Medicine

## 2019-06-18 ENCOUNTER — Other Ambulatory Visit: Payer: Self-pay

## 2019-06-18 ENCOUNTER — Telehealth: Payer: Self-pay | Admitting: Pharmacist

## 2019-06-18 ENCOUNTER — Inpatient Hospital Stay (HOSPITAL_BASED_OUTPATIENT_CLINIC_OR_DEPARTMENT_OTHER): Payer: Medicare PPO | Admitting: Internal Medicine

## 2019-06-18 ENCOUNTER — Ambulatory Visit: Payer: Medicare PPO

## 2019-06-18 DIAGNOSIS — E785 Hyperlipidemia, unspecified: Secondary | ICD-10-CM | POA: Insufficient documentation

## 2019-06-18 DIAGNOSIS — Z79818 Long term (current) use of other agents affecting estrogen receptors and estrogen levels: Secondary | ICD-10-CM | POA: Insufficient documentation

## 2019-06-18 DIAGNOSIS — Z7982 Long term (current) use of aspirin: Secondary | ICD-10-CM | POA: Insufficient documentation

## 2019-06-18 DIAGNOSIS — C61 Malignant neoplasm of prostate: Secondary | ICD-10-CM

## 2019-06-18 DIAGNOSIS — I129 Hypertensive chronic kidney disease with stage 1 through stage 4 chronic kidney disease, or unspecified chronic kidney disease: Secondary | ICD-10-CM | POA: Diagnosis not present

## 2019-06-18 DIAGNOSIS — C7951 Secondary malignant neoplasm of bone: Secondary | ICD-10-CM | POA: Diagnosis not present

## 2019-06-18 DIAGNOSIS — Z79899 Other long term (current) drug therapy: Secondary | ICD-10-CM | POA: Insufficient documentation

## 2019-06-18 DIAGNOSIS — Z923 Personal history of irradiation: Secondary | ICD-10-CM | POA: Insufficient documentation

## 2019-06-18 DIAGNOSIS — M2550 Pain in unspecified joint: Secondary | ICD-10-CM | POA: Insufficient documentation

## 2019-06-18 DIAGNOSIS — N189 Chronic kidney disease, unspecified: Secondary | ICD-10-CM

## 2019-06-18 DIAGNOSIS — R5381 Other malaise: Secondary | ICD-10-CM | POA: Insufficient documentation

## 2019-06-18 DIAGNOSIS — Z87891 Personal history of nicotine dependence: Secondary | ICD-10-CM | POA: Insufficient documentation

## 2019-06-18 DIAGNOSIS — Z7189 Other specified counseling: Secondary | ICD-10-CM | POA: Diagnosis not present

## 2019-06-18 DIAGNOSIS — N183 Chronic kidney disease, stage 3 unspecified: Secondary | ICD-10-CM | POA: Insufficient documentation

## 2019-06-18 DIAGNOSIS — D649 Anemia, unspecified: Secondary | ICD-10-CM | POA: Diagnosis not present

## 2019-06-18 DIAGNOSIS — D631 Anemia in chronic kidney disease: Secondary | ICD-10-CM

## 2019-06-18 DIAGNOSIS — Z8546 Personal history of malignant neoplasm of prostate: Secondary | ICD-10-CM | POA: Diagnosis not present

## 2019-06-18 DIAGNOSIS — R5383 Other fatigue: Secondary | ICD-10-CM | POA: Insufficient documentation

## 2019-06-18 LAB — CBC WITH DIFFERENTIAL/PLATELET
Abs Immature Granulocytes: 0.02 10*3/uL (ref 0.00–0.07)
Basophils Absolute: 0 10*3/uL (ref 0.0–0.1)
Basophils Relative: 0 %
Eosinophils Absolute: 0.1 10*3/uL (ref 0.0–0.5)
Eosinophils Relative: 1 %
HCT: 34.9 % — ABNORMAL LOW (ref 39.0–52.0)
Hemoglobin: 10.2 g/dL — ABNORMAL LOW (ref 13.0–17.0)
Immature Granulocytes: 0 %
Lymphocytes Relative: 15 %
Lymphs Abs: 1 10*3/uL (ref 0.7–4.0)
MCH: 26.8 pg (ref 26.0–34.0)
MCHC: 29.2 g/dL — ABNORMAL LOW (ref 30.0–36.0)
MCV: 91.6 fL (ref 80.0–100.0)
Monocytes Absolute: 0.5 10*3/uL (ref 0.1–1.0)
Monocytes Relative: 8 %
Neutro Abs: 5.3 10*3/uL (ref 1.7–7.7)
Neutrophils Relative %: 76 %
Platelets: 241 10*3/uL (ref 150–400)
RBC: 3.81 MIL/uL — ABNORMAL LOW (ref 4.22–5.81)
RDW: 16.2 % — ABNORMAL HIGH (ref 11.5–15.5)
WBC: 7 10*3/uL (ref 4.0–10.5)
nRBC: 0 % (ref 0.0–0.2)

## 2019-06-18 LAB — BASIC METABOLIC PANEL
Anion gap: 7 (ref 5–15)
BUN: 46 mg/dL — ABNORMAL HIGH (ref 8–23)
CO2: 25 mmol/L (ref 22–32)
Calcium: 9.3 mg/dL (ref 8.9–10.3)
Chloride: 109 mmol/L (ref 98–111)
Creatinine, Ser: 1.47 mg/dL — ABNORMAL HIGH (ref 0.61–1.24)
GFR calc Af Amer: 50 mL/min — ABNORMAL LOW (ref >60)
GFR calc non Af Amer: 43 mL/min — ABNORMAL LOW (ref >60)
Glucose, Bld: 106 mg/dL — ABNORMAL HIGH (ref 70–99)
Potassium: 4.2 mmol/L (ref 3.5–5.1)
Sodium: 141 mmol/L (ref 135–145)

## 2019-06-18 MED ORDER — ENZALUTAMIDE 40 MG PO CAPS: 120.0000 mg | ORAL_CAPSULE | Freq: Every day | ORAL | 0 refills | Status: DC

## 2019-06-18 NOTE — Assessment & Plan Note (Addendum)
#  Metastatic prostate cancer castrate resistant- on Lupron 22.5 mg [03/26/2019];OCTOBER 2020- PSA-3.5.  January 2021 -bone- scan shows new lesions in right ilaic/ ribs/ calvarium.   #I reviewed my concerns of progressive disease based on the bone scan/rising PSA.  I would recommend additional therapy-Xtandi 120 mg once a day.  Discussed the possible side effects including but not limited to hot flashes fatigue possible risk of seizures.  However benefits of treatment outweigh the risk.  Patient understands treatments  are palliative not curative.  #Normocytic anemia-unclear etiology; status post bone marrow biopsy/NGS normal.?-related to chronic disease/? CKD.  #Hemoglobin -9.5 improved status post IV Venofer.  Proceed with Retacrit tomorrow  #CKD stage III creatinine 1.3-stable bilateral hydro-nephrosis /hydroureter -followed closely by urology.  #Bone lesions-castrate resistant prostate cancer will benefit from Zometa infusion.  Will discuss.  # I offered to call the patient's family to give an update on clinical status; patient declines.    # DISPOSITION: [D-2 retacrit/insurance] # NO retacrit tomorrow # in 2 weeks- MD; labs- H&H- PSA; ELigard; D-2 retacrit- Dr.B  # I reviewed the blood work- with the patient in detail; also reviewed the imaging independently [as summarized above]; and with the patient in detail.

## 2019-06-18 NOTE — Telephone Encounter (Addendum)
Oral Oncology Pharmacist Encounter  Received new prescription for Xtandi (enzalutamide) for the treatment of metastatic, castration-resistant prostate cancer in conjunction with Lupron, planned duration until disease progression or unacceptable toxicity.  Prescription dose and frequency assessed. Noted patient is being initiated on dose reduction from the typical starting dose of 160 mg by mouth once daily. Confirmed with MD that plan is for patient to initiate on reduced dose and then dose-escalate based on patient's tolerance.     CBC with Diff and BMP from 06/18/19 assessed:  Patient with PMH significant for CKD. Patient's Scr 1.47 (CrCl ~36 mL/min) - OK to proceed with treatment initiation.   Current medication list in Epic reviewed, DDIs with Xtandi (enzalutamide) identified:  Category D Interaction between tamsulosin and Xtandi - Xtandi may decrease the serum concentrations of CYP3A4 substrates, like tamsulosin, resulting in a decrease in efficacy. Will counsel patient on drug-drug interaction. No adjustments in therapy necessary at this time.   Prescription has been e-scribed to the [facility] for benefits analysis and approval.  Oral Oncology Clinic will continue to follow for insurance authorization, copayment issues, initial counseling and start date.  Leron Croak, PharmD, [place] PGY2 Hematology/Oncology Pharmacy Resident 06/18/2019 4:23 PM Oral Oncology Clinic [phone]

## 2019-06-18 NOTE — Telephone Encounter (Signed)
Oral Oncology Patient Advocate Encounter   Received notification from [facility] that prior authorization for Gillermina Phy is required.   PA submitted on CoverMyMeds Key [id number]  Status is pending   Oral Oncology Clinic will continue to follow.  [place] Patient [place] Phone [phone] Fax [phone] 06/19/2019 9:17 AM

## 2019-06-18 NOTE — Telephone Encounter (Signed)
Oral Oncology Patient Advocate Encounter  Was successful in securing patient a $8,000 grant from [employer] to provide copayment coverage for [place].  This will keep the out of pocket expense at $0.     [employer] ID: [id number]  I have spoken with the patient.   The billing information is as follows and has been shared with [name].    RxBin: [id number] PCN: [id number] Member ID: [id number] Group ID: [id number] Dates of Eligibility: 05/19/2019 through 05/17/2020  Thomas Lopez Patient [name] Phone [phone] Fax [phone] 06/18/2019 3:59 PM

## 2019-06-18 NOTE — Progress Notes (Signed)
[place] OFFICE PROGRESS NOTE  Patient Care Team: [name], MD as PCP - General (Family Medicine) Bond, Tracie Harrier, MD as Consulting Physician (Ophthalmology) Abbie Sons, MD as Consulting Physician (Urology)  Cancer Staging No matching staging information was found for the patient.   Oncology History Overview Note  # external beam radiation therapy and radiation seed implant in 2005 with Dr. Joelyn Oms and Dr. Danny Lawless for T1c Gleason 3+4 = 7 prostate cancer with a pretreatment PSA of 6.8. In November 2015, he was found to have biochemical recurrence.  Dec 2016- CT a/p & Bone scan- NEG; PSA- 11; June 2017- 19; STARTED on Firmagon [Dr.Budzyn]; on Lupron q [place]  # JAN 2021-PSA rising-3.5.  Bone scan-progression of disease. START X-Tandi 1102m/day.   # OCT 2018- Anemia-? Etiology; Likely anemia of chronic disease [anti-CCP elevated/ but NO RA; rheumatology; ]BMBx- hypercellular 30%; relative myeloid hyperplasia/erythroid hypoplasia; FISH- 5/7/8-Normal. [facility]  # CKD- III; Bladder neck obstruction s/p Foley [Dr.Stoiff]  # JAN 2018- Dexa scan- N  # NGS-P # PALLIATIVE CARE-P ------------------------------------   DIAGNOSIS: PROSTATE CANCER  STAGE:  IV       ;GOALS: control  CURRENT/MOST RECENT THERAPY: Lupron+ X-tandi    [place]      INTERVAL HISTORY:  Thomas [id number].[id number].  male pleasant patient above history of prostate cancer on Lupron-with rising PSA ; anemia of unclear etiology/ ? CKD-is here today with results of his bone scan.  Patient complains of chronic mild fatigue.  Chronic mild joint pains not any worse.  Denies any worsening shortness of breath or cough.  Review of Systems  Constitutional: Positive for malaise/fatigue. Negative for chills, diaphoresis and fever.  HENT: Negative for nosebleeds and sore throat.   Eyes: Negative for double vision.  Respiratory:  Positive for shortness of breath. Negative for cough, hemoptysis, sputum production and wheezing.   Cardiovascular: Negative for chest pain, palpitations, orthopnea and leg swelling.  Gastrointestinal: Negative for abdominal pain, blood in stool, constipation, diarrhea, heartburn, melena, nausea and vomiting.  Genitourinary: Negative for dysuria, frequency and urgency.  Musculoskeletal: Positive for joint pain. Negative for back pain.  Skin: Negative.  Negative for itching and rash.  Neurological: Negative for dizziness, tingling, focal weakness, weakness and headaches.  Endo/Heme/Allergies: Does not bruise/bleed easily.  Psychiatric/Behavioral: Negative for depression. The patient is not nervous/anxious and does not have insomnia.       PAST MEDICAL HISTORY :  Past Medical History:  Diagnosis Date  . [place]ac [place]   [place] scan  . Carotid stenosis 11/2014   mild bilateral 1-39%, rpt 2 yrs  . Colon polyps    rpt colonoscopy due 2014  . ED (erectile dysfunction)    Trimix and VED  . Esophagitis   . Essential hypertension 06/29/2009  . GERD (gastroesophageal reflux disease)   . Glaucoma    severe (Bond)  . HLD (hyperlipidemia)   . HTN (hypertension)   . Hyperglycemia   . IBS (irritable bowel syndrome)   . Prostate cancer ([facility] 2005   s/p seed implant, [facility] with androgen deprivation referred to cancer center  . Smoking history     PAST SURGICAL HISTORY :   Past Surgical History:  Procedure Laterality Date  . CATARACT EXTRACTION Left 05/2017   Dr. BEdilia Bo . [place]ACT [place] BIL[place]AL  [place] J[place]elle [place] .  COLONOSCOPY  03/01/01   Multiple polyps, divertic//adenomatous polyps  . COLONOSCOPY  04/24/01   multiple polyps  . COLONOSCOPY  10/23/01   Polyps benign-repeat every 2 years  . COLONOSCOPY  06/23/04   Adenom/hyperplastic polyps; multiple external hemorrhoids  .  COLONOSCOPY  06/08/07   single small polyp-repeat 5 years  . CYSTOSCOPY WITH INSERTION OF UROLIFT N/A 04/10/2018   Procedure: CYSTOSCOPY WITH INSERTION OF UROLIFT;  Surgeon: Abbie Sons, MD;  Location: [facility] ORS;  Service: Urology;  Laterality: N/A;  . ESOPHAGOGASTRODUODENOSCOPY  03/01/01   H.H.; gastritis esoph. dudodenitis  . NCS/Wrists Left Carpal  2/02   Min./right improved  . PFTs  10/2012   FVC 64%, FEV1 41%, ratio 0.62  . PROSTATE BIOPSY  12/04   positive; radioactive seed implant (Dr. Joelyn Oms)  . Prostate Ext Beam Radiation  1/27-07/23/03   Dr. Danny Lawless  . SPIROMETRY  09/2011   WNL  . TONSILLECTOMY    . Wrist Release  6/98; 9/99   Right    FAMILY HISTORY :   Family History  Problem Relation Age of Onset  . Cancer Mother        colon  . Hyperlipidemia Mother   . Other Mother        Carotid disease  . Coronary artery disease Neg Hx   . Stroke Neg Hx     SOCIAL HISTORY:   Social History   Tobacco Use  . Smoking status: Former Smoker    Quit date: 05/23/1993    Years since quitting: 26.0  . Smokeless tobacco: Never Used  Substance Use Topics  . Alcohol use: Yes    Comment: Rare  . Drug use: No    ALLERGIES:  has No Known Allergies.  MEDICATIONS:  Current Outpatient Medications  Medication Sig Dispense Refill  . aspirin EC 81 MG tablet Take 81 mg by mouth daily.    . Cholecalciferol (VITAMIN D3) 25 MCG (1000 UT) CAPS Take 1 capsule (1,000 Units total) by mouth daily. 30 capsule   . dorzolamide-timolol (COSOPT) 22.3-6.8 MG/ML ophthalmic solution Place 1 drop into the left eye 2 (two) times daily.    . ferrous sulfate 325 (65 FE) MG tablet Take 1 tablet (325 mg total) by mouth daily with breakfast.  3  . leuprolide (LUPRON) 22.5 MG injection Inject 22.5 mg into the muscle every 3 (three) months.    . Leuprolide Acetate, 3 Month, (ELIGARD) 22.5 MG injection Inject 22.5 mg into the skin every 3 (three) months. 1 each 3  . lisinopril (ZESTRIL) 10 MG tablet Take 1  tablet (10 mg total) by mouth daily. 90 tablet 3  . LUMIGAN 0.01 % SOLN     . methylcellulose oral powder Take by mouth daily.    Marland Kitchen omeprazole (PRILOSEC) 40 MG capsule TAKE 1 CAPSULE EVERY DAY 90 capsule 1  . pilocarpine (PILOCAR) 1 % ophthalmic solution Place 1 drop into the left eye 3 (three) times daily.    . rosuvastatin (CRESTOR) 10 MG tablet TAKE 1 TABLET EVERY DAY 90 tablet 1  . tamsulosin (FLOMAX) 0.4 MG CAPS capsule Take 0.4 mg by mouth. After supper    . timolol (TIMOPTIC) 0.5 % ophthalmic solution Place 1 drop into the left eye 2 (two) times daily.     . trospium (SANCTURA) 20 MG tablet TAKE ONE TABLET BY MOUTH EVERY NIGHT AT BEDTIME 30 tablet 0  . venlafaxine XR (EFFEXOR-XR) 37.5 MG 24 hr capsule TAKE 1 CAPSULE EVERY DAY WITH BREAKFAST 90 capsule 1  .  enzalutamide (XTANDI) 40 MG capsule Take 3 capsules (120 mg total) by mouth daily. 90 capsule 0   No current facility-administered medications for this visit.    PHYSICAL EXAMINATION: ECOG PERFORMANCE STATUS: 1 - Symptomatic but completely ambulatory  BP (!) 147/62 (Patient Position: Sitting)   Pulse (!) 56   Temp 97.6 F (36.4 C) (Tympanic)   Resp 20   Ht 5' 9"  (1.753 m)   Wt 149 lb (67.6 kg)   BMI 22.00 kg/m   Filed Weights   06/18/19 1402  Weight: 149 lb (67.6 kg)    Physical Exam  Constitutional: He is oriented to person, place, and time.  Thin built cachectic appearing male patient .  He is alone.  Walking by himself.  Appears pale.  HENT:  Head: Normocephalic and atraumatic.  Mouth/Throat: Oropharynx is clear and moist. No oropharyngeal exudate.  Eyes: Pupils are equal, round, and reactive to light.  Cardiovascular: Normal rate and regular rhythm.  Pulmonary/Chest: No respiratory distress. He has no wheezes.  Abdominal: Soft. Bowel sounds are normal. He exhibits no distension and no mass. There is no abdominal tenderness. There is no rebound and no guarding.  Musculoskeletal:        General: No tenderness or  edema. Normal range of motion.     Cervical back: Normal range of motion and neck supple.  Neurological: He is alert and oriented to person, place, and time.  Skin: Skin is warm.  Psychiatric: Affect normal.   LABORATORY DATA:  I have reviewed the data as listed    Component Value Date/Time   NA 141 06/18/2019 1345   K 4.2 06/18/2019 1345   CL 109 06/18/2019 1345   CO2 25 06/18/2019 1345   GLUCOSE 106 (H) 06/18/2019 1345   BUN 46 (H) 06/18/2019 1345   CREATININE 1.47 (H) 06/18/2019 1345   CALCIUM 9.3 06/18/2019 1345   PROT 7.7 10/16/2018 0957   ALBUMIN 4.1 11/13/2018 0828   AST 19 10/16/2018 0957   ALT 12 10/16/2018 0957   ALKPHOS 63 10/16/2018 0957   BILITOT 0.4 10/16/2018 0957   GFRNONAA 43 (L) 06/18/2019 1345   GFRAA 50 (L) 06/18/2019 1345    No results found for: SPEP, UPEP  Lab Results  Component Value Date   WBC 7.0 06/18/2019   NEUTROABS 5.3 06/18/2019   HGB 10.2 (L) 06/18/2019   HCT 34.9 (L) 06/18/2019   MCV 91.6 06/18/2019   PLT 241 06/18/2019      Chemistry      Component Value Date/Time   NA 141 06/18/2019 1345   K 4.2 06/18/2019 1345   CL 109 06/18/2019 1345   CO2 25 06/18/2019 1345   BUN 46 (H) 06/18/2019 1345   CREATININE 1.47 (H) 06/18/2019 1345      Component Value Date/Time   CALCIUM 9.3 06/18/2019 1345   ALKPHOS 63 10/16/2018 0957   AST 19 10/16/2018 0957   ALT 12 10/16/2018 0957   BILITOT 0.4 10/16/2018 0957       RADIOGRAPHIC STUDIES: I have personally reviewed the radiological images as listed and agreed with the findings in the report. NM Bone Scan Whole Body  Result Date: 06/18/2019 CLINICAL DATA:  Prostate cancer, non-small cell lung cancer EXAM: NUCLEAR MEDICINE WHOLE BODY BONE SCAN TECHNIQUE: Whole body anterior and posterior images were obtained approximately 3 hours after intravenous injection of radiopharmaceutical. RADIOPHARMACEUTICALS:  21.86 mCi Technetium-50mMDP IV COMPARISON:  04/24/2015 Radiographic correlation: None  recent FINDINGS: New foci of abnormal osseous tracer accumulation  are seen at the calvarium, anterior RIGHT fifth rib, and likely anterior RIGHT iliac crest suspicious for osseous metastases. Minimal degenerative type uptake at the shoulders, sternoclavicular joints, hips, lumbar spine. No additional worrisome sites of osseous tracer uptake. Expected urinary tract and soft tissue distribution of tracer. IMPRESSION: New sites of abnormal uptake at the calvarium, anterior RIGHT fifth rib, and likely anterior RIGHT iliac crest suspicious for osseous metastases. Electronically Signed   By: Lavonia Dana M.D.   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  January 2021 -bone- scan shows new lesions in right ilaic/ ribs/ calvarium.   #I reviewed my concerns of progressive disease based on the bone scan/rising PSA.  I would recommend additional therapy-Xtandi 120 mg once a day.  Discussed the possible side effects including but not limited to hot flashes fatigue possible risk of seizures.  However benefits of treatment outweigh the risk.  Patient understands treatments  are palliative not curative.  #Normocytic anemia-unclear etiology; status post bone marrow biopsy/NGS normal.?-related to chronic disease/? CKD.  #Hemoglobin -9.5 improved status post IV Venofer.  Proceed with Retacrit tomorrow  #CKD stage III creatinine 1.3-stable bilateral hydro-nephrosis /hydroureter -followed closely by urology.  #Bone lesions-castrate resistant prostate cancer will benefit from Zometa infusion.  Will discuss.  # I offered to call the patient's family to give an update on clinical status; patient declines.    # DISPOSITION: [D-2 retacrit/insurance] # NO retacrit tomorrow # in 2 weeks- MD; labs- H&H- PSA; ELigard; D-2 retacrit- Dr.B  # I reviewed the blood work- with the  patient in detail; also reviewed the imaging independently [as summarized above]; and with the patient in detail.     No orders of the defined types were placed in this encounter.  All questions were answered. The patient knows to call the clinic with any problems, questions or concerns.      Cammie Sickle, MD 06/19/2019 1:19 PM

## 2019-06-19 ENCOUNTER — Inpatient Hospital Stay: Payer: Medicare PPO

## 2019-06-19 DIAGNOSIS — Z7189 Other specified counseling: Secondary | ICD-10-CM | POA: Insufficient documentation

## 2019-06-19 MED FILL — XTANDI 40 MG CAPSULE: 40 | 30 days supply | Qty: 90 | Fill #0

## 2019-06-19 NOTE — Telephone Encounter (Signed)
Medication scheduled to be delivered to patient on 06/20/19 from [facility].

## 2019-06-19 NOTE — Telephone Encounter (Signed)
Oral Chemotherapy Pharmacist Encounter  I spoke with patient for overview of: Xtandi for the treatment of metastatic, castration-resistant prostate cancer in conjunction with Lupron, planned duration until disease progression or unacceptable toxicity.   Counseled patient on administration, dosing, side effects, monitoring, drug-food interactions, safe handling, storage, and disposal.  Patient will take Xtandi 40mg  capsules, 3 capsules (120mg ) by mouth once daily without regard to food.  Xtandi start date: 06/20/19  Adverse effects include but are not limited to: peripheral edema, GI upset, hypertension, hot flashes, fatigue, falls/fractures, and arthralgias.    Patient instructed about small risk of seizures with Xtandi treatment.  Reviewed with patient importance of keeping a medication schedule and plan for any missed doses.  Discussed with Mr. Conser the drug-drug interaction between [facility] and tamsulosin. He knows to alert the office if he feels that the tamsulosin is having decreased efficacy while on therapy.  Medication reconciliation performed and medication/allergy list updated.  All questions answered.  Mr. Mcgloin voiced understanding and appreciation.   Patient knows to call the office with questions or concerns.   Leron Croak, PharmD, [place] PGY2 Hematology/Oncology Pharmacy Resident 06/19/2019 12:33 PM Oral Oncology Clinic [phone]

## 2019-06-19 NOTE — Telephone Encounter (Signed)
Oral Oncology Patient Advocate Encounter  Prior Authorization for [facility] [employer] has been approved.    PA# [id number] Effective dates: 06/19/2019 through 12/16/2019  Patients co-pay is $2407.29.  I have obtained a grant through [employer] to cover the patients copay.  Fatima Sanger information is documented in a separate encounter.  Oral Oncology Clinic will continue to follow.   [place] Patient [place] Phone [phone] Fax [phone] 06/19/2019 9:20 AM

## 2019-07-02 ENCOUNTER — Other Ambulatory Visit: Payer: Self-pay | Admitting: *Deleted

## 2019-07-02 ENCOUNTER — Telehealth: Payer: Self-pay | Admitting: Pharmacy Technician

## 2019-07-02 DIAGNOSIS — D649 Anemia, unspecified: Secondary | ICD-10-CM

## 2019-07-02 DIAGNOSIS — C61 Malignant neoplasm of prostate: Secondary | ICD-10-CM

## 2019-07-02 NOTE — Telephone Encounter (Signed)
Oral Oncology Patient Advocate Encounter   Was successful in securing patient an $7,100 grant from [facility] [place] [facility]) to provide copayment coverage for Xtandi.  This will keep the out of pocket expense at $0.     I have spoken with the patient.    The billing information is as follows and has been shared with [place].   Member ID: [id number] Group ID: [id number] RxBin: [id number] Dates of Eligibility: 04/03/19 through 06/30/20  Fund:  [place] [facility] [place] Phone [phone] Fax [phone] 07/02/2019 12:46 PM

## 2019-07-03 ENCOUNTER — Inpatient Hospital Stay: Payer: Medicare PPO | Attending: Internal Medicine

## 2019-07-03 ENCOUNTER — Other Ambulatory Visit: Payer: Self-pay

## 2019-07-03 ENCOUNTER — Inpatient Hospital Stay (HOSPITAL_BASED_OUTPATIENT_CLINIC_OR_DEPARTMENT_OTHER): Payer: Medicare PPO | Admitting: Internal Medicine

## 2019-07-03 ENCOUNTER — Inpatient Hospital Stay: Payer: Medicare PPO

## 2019-07-03 DIAGNOSIS — Z79818 Long term (current) use of other agents affecting estrogen receptors and estrogen levels: Secondary | ICD-10-CM | POA: Diagnosis not present

## 2019-07-03 DIAGNOSIS — C61 Malignant neoplasm of prostate: Secondary | ICD-10-CM | POA: Diagnosis not present

## 2019-07-03 DIAGNOSIS — N183 Chronic kidney disease, stage 3 unspecified: Secondary | ICD-10-CM | POA: Insufficient documentation

## 2019-07-03 DIAGNOSIS — Z79899 Other long term (current) drug therapy: Secondary | ICD-10-CM | POA: Diagnosis not present

## 2019-07-03 DIAGNOSIS — N1330 Unspecified hydronephrosis: Secondary | ICD-10-CM | POA: Diagnosis not present

## 2019-07-03 DIAGNOSIS — C7951 Secondary malignant neoplasm of bone: Secondary | ICD-10-CM | POA: Insufficient documentation

## 2019-07-03 DIAGNOSIS — N134 Hydroureter: Secondary | ICD-10-CM | POA: Diagnosis not present

## 2019-07-03 DIAGNOSIS — D649 Anemia, unspecified: Secondary | ICD-10-CM

## 2019-07-03 LAB — HEMOGLOBIN: Hemoglobin: 10.4 g/dL — ABNORMAL LOW (ref 13.0–17.0)

## 2019-07-03 LAB — PSA: Prostatic Specific Antigen: 2.32 ng/mL (ref 0.00–4.00)

## 2019-07-03 LAB — HEMATOCRIT: HCT: 35.3 % — ABNORMAL LOW (ref 39.0–52.0)

## 2019-07-03 MED ORDER — LEUPROLIDE ACETATE (3 MONTH) 22.5 MG ~~LOC~~ KIT
22.5000 mg | PACK | Freq: Once | SUBCUTANEOUS | Status: AC
  Administered 2019-07-03: 15:00:00 22.5 mg via SUBCUTANEOUS
  Filled 2019-07-03: qty 22.5

## 2019-07-03 NOTE — Assessment & Plan Note (Addendum)
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  January 2021 -bone- scan shows new lesions in right ilaic/ ribs/ calvarium- On xtandi 120 mg/day. STABLE. Await on PSA.   #Hemoglobin -10.3  improved status post IV Venofer.HOLD Retacrit tomorrow  #CKD stage III creatinine 1.3-stable bilateral hydro-nephrosis /hydroureter -followed closely by urology.  #Bone lesions-castrate resistant prostate cancer will benefit from Zometa infusion.  Will discuss.  # I offered to call the patient's family to give an update on clinical status; patient declines.   # DISPOSITION: [D-2 retacrit/insurance] # NO retacrit tomorrow;  ELIGARD TODAY # in 4 weeks- MD; labs- cbc/cmp; PSA; D-2- Retacrit- Dr.B

## 2019-07-03 NOTE — Progress Notes (Signed)
[facility] OFFICE PROGRESS NOTE  Patient Care Team: [name], MD as PCP - General (Family Medicine) Bond, Tracie Harrier, MD as Consulting Physician (Ophthalmology) Abbie Sons, MD as Consulting Physician (Urology) Cammie Sickle, MD as Consulting Physician (Hematology and Oncology)  Cancer Staging No matching staging information was found for the patient.   Oncology History Overview Note  # external beam radiation therapy and radiation seed implant in 2005 with Dr. Joelyn Oms and Dr. Danny Lawless for T1c Gleason 3+4 = 7 prostate cancer with a pretreatment PSA of 6.8. In November 2015, he was found to have biochemical recurrence.  Dec 2016- CT a/p & Bone scan- NEG; PSA- 11; June 2017- 19; STARTED on Firmagon [Dr.Budzyn]; on Lupron q [place]  # JAN 2021-PSA rising-3.5.  Bone scan-progression of disease. START X-Tandi 120mg /day.   # OCT 2018- Anemia-? Etiology; Likely anemia of chronic disease [anti-CCP elevated/ but NO RA; rheumatology; ]BMBx- hypercellular 30%; relative myeloid hyperplasia/erythroid hypoplasia; FISH- 5/7/8-Normal. II OPINION [facility]- NEG  # CKD- III; Bladder neck obstruction s/p Foley [Dr.Stoiff]  # JAN 2018- Dexa scan- N  # NGS-P # PALLIATIVE CARE-P ------------------------------------   DIAGNOSIS: PROSTATE CANCER  STAGE:  IV       ;GOALS: control  CURRENT/MOST RECENT THERAPY: Lupron+ X-tandi    Primary adenocarcinoma of prostate ([place])      INTERVAL HISTORY:  CAULDER GOTZ 86 y.o.  male pleasant patient above history of metastatic prostate cancer to bone on Lupron/Xtandi; anemia iron deficiency/ CKD-is here for follow-up.  Patient continues to complain of chronic mild fatigue.  Denies any worsening fatigue.  Chronic joint pains not any worse.   Denies any worsening shortness of breath or cough.  Review of Systems  Constitutional: Positive for malaise/fatigue. Negative for chills, diaphoresis and fever.   HENT: Negative for nosebleeds and sore throat.   Eyes: Negative for double vision.  Respiratory: Positive for shortness of breath. Negative for cough, hemoptysis, sputum production and wheezing.   Cardiovascular: Negative for chest pain, palpitations, orthopnea and leg swelling.  Gastrointestinal: Negative for abdominal pain, blood in stool, constipation, diarrhea, heartburn, melena, nausea and vomiting.  Genitourinary: Negative for dysuria, frequency and urgency.  Musculoskeletal: Positive for joint pain. Negative for back pain.  Skin: Negative.  Negative for itching and rash.  Neurological: Negative for dizziness, tingling, focal weakness, weakness and headaches.  Endo/Heme/Allergies: Does not bruise/bleed easily.  Psychiatric/Behavioral: Negative for depression. The patient is not nervous/anxious and does not have insomnia.       PAST MEDICAL HISTORY :  Past Medical History:  Diagnosis Date  . Aorto-iliac atherosclerosis ([place]) 04/2015   by CT scan  . Carotid stenosis 11/2014   mild bilateral 1-39%, rpt 2 yrs  . Colon polyps    rpt colonoscopy due 2014  . ED (erectile dysfunction)    Trimix and VED  . Esophagitis   . Essential hypertension 06/29/2009  . GERD (gastroesophageal reflux disease)   . Glaucoma    severe ([place])  . HLD (hyperlipidemia)   . HTN (hypertension)   . Hyperglycemia   . IBS (irritable bowel syndrome)   . Prostate cancer [facility]) 2005   s/p seed implant, EBRT (Dr. Alinda Money at [facility]) recurrent treating with androgen deprivation referred to cancer center  . Smoking history     PAST SURGICAL HISTORY :   Past Surgical History:  Procedure Laterality Date  . CATARACT EXTRACTION Left 05/2017   Dr. Edilia Bo  . CATARACT EXTRACTION, BILATERAL  December  2016   [place], Dr. Raynelle Fanning  . COLONOSCOPY  03/01/01   Multiple polyps, divertic//adenomatous polyps  . COLONOSCOPY  04/24/01   multiple polyps  . COLONOSCOPY  10/23/01   Polyps benign-repeat every  2 years  . COLONOSCOPY  06/23/04   Adenom/hyperplastic polyps; multiple external hemorrhoids  . COLONOSCOPY  06/08/07   single small polyp-repeat 5 years  . CYSTOSCOPY WITH INSERTION OF UROLIFT N/A 04/10/2018   Procedure: CYSTOSCOPY WITH INSERTION OF UROLIFT;  Surgeon: Abbie Sons, MD;  Location: [facility] ORS;  Service: Urology;  Laterality: N/A;  . ESOPHAGOGASTRODUODENOSCOPY  03/01/01   H.H.; gastritis esoph. dudodenitis  . NCS/Wrists Left Carpal  2/02   Min./right improved  . PFTs  10/2012   FVC 64%, FEV1 41%, ratio 0.62  . PROSTATE BIOPSY  12/04   positive; radioactive seed implant (Dr. Joelyn Oms)  . Prostate Ext Beam Radiation  1/27-07/23/03   Dr. Danny Lawless  . SPIROMETRY  09/2011   WNL  . TONSILLECTOMY    . Wrist Release  6/98; 9/99   Right    FAMILY HISTORY :   Family History  Problem Relation Age of Onset  . Cancer Mother        colon  . Hyperlipidemia Mother   . Other Mother        Carotid disease  . Coronary artery disease Neg Hx   . Stroke Neg Hx     SOCIAL HISTORY:   Social History   Tobacco Use  . Smoking status: Former Smoker    Quit date: 05/23/1993    Years since quitting: 26.1  . Smokeless tobacco: Never Used  Substance Use Topics  . Alcohol use: Yes    Comment: Rare  . Drug use: No    ALLERGIES:  has No Known Allergies.  MEDICATIONS:  Current Outpatient Medications  Medication Sig Dispense Refill  . aspirin EC 81 MG tablet Take 81 mg by mouth daily.    . Cholecalciferol (VITAMIN D3) 25 MCG (1000 UT) CAPS Take 1 capsule (1,000 Units total) by mouth daily. 30 capsule   . dorzolamide-timolol (COSOPT) 22.3-6.8 MG/ML ophthalmic solution Place 1 drop into the left eye 2 (two) times daily.    . enzalutamide (XTANDI) 40 MG capsule Take 3 capsules (120 mg total) by mouth daily. 90 capsule 0  . ferrous sulfate 325 (65 FE) MG tablet Take 1 tablet (325 mg total) by mouth daily with breakfast.  3  . leuprolide (LUPRON) 22.5 MG injection Inject 22.5 mg into the  muscle every 3 (three) months.    . Leuprolide Acetate, 3 Month, (ELIGARD) 22.5 MG injection Inject 22.5 mg into the skin every 3 (three) months. 1 each 3  . lisinopril (ZESTRIL) 10 MG tablet Take 1 tablet (10 mg total) by mouth daily. 90 tablet 3  . LUMIGAN 0.01 % SOLN     . omeprazole (PRILOSEC) 40 MG capsule TAKE 1 CAPSULE EVERY DAY 90 capsule 1  . pilocarpine (PILOCAR) 1 % ophthalmic solution Place 1 drop into the left eye 3 (three) times daily.    . rosuvastatin (CRESTOR) 10 MG tablet TAKE 1 TABLET EVERY DAY 90 tablet 1  . tamsulosin (FLOMAX) 0.4 MG CAPS capsule Take 0.4 mg by mouth. After supper    . timolol (TIMOPTIC) 0.5 % ophthalmic solution Place 1 drop into the left eye 2 (two) times daily.     . trospium (SANCTURA) 20 MG tablet TAKE ONE TABLET BY MOUTH EVERY NIGHT AT BEDTIME 30 tablet 0  .  venlafaxine XR (EFFEXOR-XR) 37.5 MG 24 hr capsule TAKE 1 CAPSULE EVERY DAY WITH BREAKFAST 90 capsule 1   No current facility-administered medications for this visit.    PHYSICAL EXAMINATION: ECOG PERFORMANCE STATUS: 1 - Symptomatic but completely ambulatory  BP (!) 113/96   Pulse (!) 57   Temp (!) 95.7 F (35.4 C) (Tympanic)   Resp (!) 22   Ht 5\' 9"  (1.753 m)   Wt 150 lb (68 kg)   BMI 22.15 kg/m   Filed Weights   07/03/19 1455  Weight: 150 lb (68 kg)    Physical Exam  Constitutional: He is oriented to person, place, and time.  Thin built cachectic appearing male patient .  He is alone.  Walking by himself.  Appears pale.  HENT:  Head: Normocephalic and atraumatic.  Mouth/Throat: Oropharynx is clear and moist. No oropharyngeal exudate.  Eyes: Pupils are equal, round, and reactive to light.  Cardiovascular: Normal rate and regular rhythm.  Pulmonary/Chest: No respiratory distress. He has no wheezes.  Abdominal: Soft. Bowel sounds are normal. He exhibits no distension and no mass. There is no abdominal tenderness. There is no rebound and no guarding.  Musculoskeletal:         General: No tenderness or edema. Normal range of motion.     Cervical back: Normal range of motion and neck supple.  Neurological: He is alert and oriented to person, place, and time.  Skin: Skin is warm.  Psychiatric: Affect normal.   LABORATORY DATA:  I have reviewed the data as listed    Component Value Date/Time   NA 141 06/18/2019 1345   K 4.2 06/18/2019 1345   CL 109 06/18/2019 1345   CO2 25 06/18/2019 1345   GLUCOSE 106 (H) 06/18/2019 1345   BUN 46 (H) 06/18/2019 1345   CREATININE 1.47 (H) 06/18/2019 1345   CALCIUM 9.3 06/18/2019 1345   PROT 7.7 10/16/2018 0957   ALBUMIN 4.1 11/13/2018 0828   AST 19 10/16/2018 0957   ALT 12 10/16/2018 0957   ALKPHOS 63 10/16/2018 0957   BILITOT 0.4 10/16/2018 0957   GFRNONAA 43 (L) 06/18/2019 1345   GFRAA 50 (L) 06/18/2019 1345    No results found for: SPEP, UPEP  Lab Results  Component Value Date   WBC 7.0 06/18/2019   NEUTROABS 5.3 06/18/2019   HGB 10.4 (L) 07/03/2019   HCT 35.3 (L) 07/03/2019   MCV 91.6 06/18/2019   PLT 241 06/18/2019      Chemistry      Component Value Date/Time   NA 141 06/18/2019 1345   K 4.2 06/18/2019 1345   CL 109 06/18/2019 1345   CO2 25 06/18/2019 1345   BUN 46 (H) 06/18/2019 1345   CREATININE 1.47 (H) 06/18/2019 1345      Component Value Date/Time   CALCIUM 9.3 06/18/2019 1345   ALKPHOS 63 10/16/2018 0957   AST 19 10/16/2018 0957   ALT 12 10/16/2018 0957   BILITOT 0.4 10/16/2018 0957       RADIOGRAPHIC STUDIES: I have personally reviewed the radiological images as listed and agreed with the findings in the report. No results found.   ASSESSMENT & PLAN:  Primary adenocarcinoma of prostate [facility]) # Metastatic prostate cancer castrate resistant- on Lupron 22.5 mg [03/26/2019];OCTOBER 2020- PSA-3.5.  January 2021 -bone- scan shows new lesions in right ilaic/ ribs/ calvarium- On xtandi 120 mg/day. STABLE. Await on PSA.   #Hemoglobin -10.3  improved status post IV Venofer.HOLD Retacrit  tomorrow  #CKD stage III  creatinine 1.3-stable bilateral hydro-nephrosis /hydroureter -followed closely by urology.  #Bone lesions-castrate resistant prostate cancer will benefit from Zometa infusion.  Will discuss.  # I offered to call the patient's family to give an update on clinical status; patient declines.   # DISPOSITION: [D-2 retacrit/insurance] # NO retacrit tomorrow;  ELIGARD TODAY # in 4 weeks- MD; labs- cbc/cmp; PSA; D-2- Retacrit- Dr.B    No orders of the defined types were placed in this encounter.  All questions were answered. The patient knows to call the clinic with any problems, questions or concerns.      Cammie Sickle, MD 07/08/2019 12:18 PM

## 2019-07-04 ENCOUNTER — Inpatient Hospital Stay: Payer: Medicare PPO

## 2019-07-08 DIAGNOSIS — H401133 Primary open-angle glaucoma, bilateral, severe stage: Secondary | ICD-10-CM | POA: Diagnosis not present

## 2019-07-12 ENCOUNTER — Other Ambulatory Visit: Payer: Self-pay | Admitting: Internal Medicine

## 2019-07-12 DIAGNOSIS — C61 Malignant neoplasm of prostate: Secondary | ICD-10-CM

## 2019-07-16 ENCOUNTER — Telehealth: Payer: Self-pay | Admitting: Family Medicine

## 2019-07-16 MED FILL — XTANDI 40 MG CAPSULE: 40 | 30 days supply | Qty: 90 | Fill #0

## 2019-07-16 NOTE — Telephone Encounter (Signed)
Lennette Bihari with [employer] called today He stated that they have tried to fax over paperwork to our office for the patient.  And refaxed those today, they are needing these filled out and sent back to them  Lennette Bihari stated "he has called [place] five times, and our office has failed to send these back, needed this message to be a high priority"   I did not see any phone notes about paperwork from them   Have you seen any type of paperwork from them??   Kevin's Call Back Number- [phone]

## 2019-07-17 NOTE — Telephone Encounter (Signed)
Per Rosette Reveal called back.  He was told pt needs to contact us for an OV concerning form.

## 2019-07-17 NOTE — Telephone Encounter (Signed)
Received faxed form.  However, Dr. Darnell Level says pt will need to schedule an OV to discuss items requested.  Attempted to call Thomas Lopez.  No answer.  Will try again later.

## 2019-07-21 ENCOUNTER — Ambulatory Visit: Payer: Medicare PPO | Attending: Internal Medicine

## 2019-07-21 DIAGNOSIS — Z23 Encounter for immunization: Secondary | ICD-10-CM | POA: Insufficient documentation

## 2019-07-21 NOTE — Progress Notes (Signed)
   Covid-19 Vaccination Clinic  Name:  Thomas Lopez    MRN: [id number] DOB: 05/01/1933  07/21/2019  [name] was observed post Covid-19 immunization for 15 minutes without incidence. He was provided with Vaccine Information Sheet and instruction to access the V-Safe system.   [name] was instructed to call 911 with any severe reactions post vaccine: Marland Kitchen Difficulty breathing  . Swelling of your face and throat  . A fast heartbeat  . A bad rash all over your body  . Dizziness and weakness    Immunizations Administered    Name Date Dose VIS Date Route   Pfizer COVID-19 Vaccine 07/21/2019  3:53 PM 0.3 mL 05/03/2019 Intramuscular   Manufacturer: [place]   Lot: [id number]   [place]: [id number]

## 2019-07-29 DIAGNOSIS — H401133 Primary open-angle glaucoma, bilateral, severe stage: Secondary | ICD-10-CM | POA: Diagnosis not present

## 2019-07-31 ENCOUNTER — Inpatient Hospital Stay: Payer: Medicare PPO | Attending: Internal Medicine

## 2019-07-31 ENCOUNTER — Inpatient Hospital Stay: Payer: Medicare PPO

## 2019-07-31 ENCOUNTER — Inpatient Hospital Stay (HOSPITAL_BASED_OUTPATIENT_CLINIC_OR_DEPARTMENT_OTHER): Payer: Medicare PPO | Admitting: Internal Medicine

## 2019-07-31 ENCOUNTER — Other Ambulatory Visit: Payer: Self-pay

## 2019-07-31 DIAGNOSIS — Z7982 Long term (current) use of aspirin: Secondary | ICD-10-CM | POA: Insufficient documentation

## 2019-07-31 DIAGNOSIS — R0602 Shortness of breath: Secondary | ICD-10-CM | POA: Insufficient documentation

## 2019-07-31 DIAGNOSIS — K0889 Other specified disorders of teeth and supporting structures: Secondary | ICD-10-CM | POA: Diagnosis not present

## 2019-07-31 DIAGNOSIS — Z87891 Personal history of nicotine dependence: Secondary | ICD-10-CM | POA: Insufficient documentation

## 2019-07-31 DIAGNOSIS — I129 Hypertensive chronic kidney disease with stage 1 through stage 4 chronic kidney disease, or unspecified chronic kidney disease: Secondary | ICD-10-CM | POA: Diagnosis not present

## 2019-07-31 DIAGNOSIS — C61 Malignant neoplasm of prostate: Secondary | ICD-10-CM | POA: Insufficient documentation

## 2019-07-31 DIAGNOSIS — N183 Chronic kidney disease, stage 3 unspecified: Secondary | ICD-10-CM | POA: Insufficient documentation

## 2019-07-31 DIAGNOSIS — E785 Hyperlipidemia, unspecified: Secondary | ICD-10-CM | POA: Diagnosis not present

## 2019-07-31 DIAGNOSIS — K589 Irritable bowel syndrome without diarrhea: Secondary | ICD-10-CM | POA: Insufficient documentation

## 2019-07-31 DIAGNOSIS — Z79899 Other long term (current) drug therapy: Secondary | ICD-10-CM | POA: Diagnosis not present

## 2019-07-31 DIAGNOSIS — R5383 Other fatigue: Secondary | ICD-10-CM | POA: Insufficient documentation

## 2019-07-31 DIAGNOSIS — R5381 Other malaise: Secondary | ICD-10-CM | POA: Insufficient documentation

## 2019-07-31 DIAGNOSIS — H409 Unspecified glaucoma: Secondary | ICD-10-CM | POA: Diagnosis not present

## 2019-07-31 LAB — COMPREHENSIVE METABOLIC PANEL
ALT: 11 U/L (ref 0–44)
AST: 19 U/L (ref 15–41)
Albumin: 3.1 g/dL — ABNORMAL LOW (ref 3.5–5.0)
Alkaline Phosphatase: 75 U/L (ref 38–126)
Anion gap: 13 (ref 5–15)
BUN: 39 mg/dL — ABNORMAL HIGH (ref 8–23)
CO2: 23 mmol/L (ref 22–32)
Calcium: 9.4 mg/dL (ref 8.9–10.3)
Chloride: 105 mmol/L (ref 98–111)
Creatinine, Ser: 1.51 mg/dL — ABNORMAL HIGH (ref 0.61–1.24)
GFR calc Af Amer: 48 mL/min — ABNORMAL LOW (ref >60)
GFR calc non Af Amer: 42 mL/min — ABNORMAL LOW (ref >60)
Glucose, Bld: 190 mg/dL — ABNORMAL HIGH (ref 70–99)
Potassium: 3.8 mmol/L (ref 3.5–5.1)
Sodium: 141 mmol/L (ref 135–145)
Total Bilirubin: 0.5 mg/dL (ref 0.3–1.2)
Total Protein: 7.7 g/dL (ref 6.5–8.1)

## 2019-07-31 LAB — CBC WITH DIFFERENTIAL/PLATELET
Abs Immature Granulocytes: 0.08 10*3/uL — ABNORMAL HIGH (ref 0.00–0.07)
Basophils Absolute: 0 10*3/uL (ref 0.0–0.1)
Basophils Relative: 0 %
Eosinophils Absolute: 0.1 10*3/uL (ref 0.0–0.5)
Eosinophils Relative: 1 %
HCT: 31.3 % — ABNORMAL LOW (ref 39.0–52.0)
Hemoglobin: 9.1 g/dL — ABNORMAL LOW (ref 13.0–17.0)
Immature Granulocytes: 1 %
Lymphocytes Relative: 10 %
Lymphs Abs: 1 10*3/uL (ref 0.7–4.0)
MCH: 26.3 pg (ref 26.0–34.0)
MCHC: 29.1 g/dL — ABNORMAL LOW (ref 30.0–36.0)
MCV: 90.5 fL (ref 80.0–100.0)
Monocytes Absolute: 0.4 10*3/uL (ref 0.1–1.0)
Monocytes Relative: 4 %
Neutro Abs: 7.9 10*3/uL — ABNORMAL HIGH (ref 1.7–7.7)
Neutrophils Relative %: 84 %
Platelets: 267 10*3/uL (ref 150–400)
RBC: 3.46 MIL/uL — ABNORMAL LOW (ref 4.22–5.81)
RDW: 15.4 % (ref 11.5–15.5)
WBC: 9.4 10*3/uL (ref 4.0–10.5)
nRBC: 0 % (ref 0.0–0.2)

## 2019-07-31 LAB — PSA: Prostatic Specific Antigen: 0.54 ng/mL (ref 0.00–4.00)

## 2019-07-31 MED ORDER — EPOETIN ALFA-EPBX 10000 UNIT/ML IJ SOLN
20000.0000 [IU] | Freq: Once | INTRAMUSCULAR | Status: AC
  Administered 2019-07-31: 20000 [IU] via SUBCUTANEOUS
  Filled 2019-07-31: qty 2

## 2019-07-31 NOTE — Progress Notes (Signed)
Patient reports that he has developed cold like symptoms after his covid vaccine last week.

## 2019-07-31 NOTE — Assessment & Plan Note (Addendum)
#   Metastatic prostate cancer castrate resistant- on Lupron 22.5 mg ;January 2021 -bone- scan shows new lesions in right ilaic/ ribs/ calvarium- on xtandi 120 mg/day.  Patient tolerating well no progressive disease.  #Hemoglobin -9.3-  improved status post IV Venofer; proceed with Retacrit today.  #CKD stage III -GFR 48.  Stable.  bilateral hydro-nephrosis /hydroureter -followed closely by urology.  # cough/ runny nose- no fevers- ? Related to covid vaccine; OTC histamine as needed.   #Bone lesions-castrate resistant prostate cancer; will consider Zometa infusions every 3 months or so; concerned about patient's dental issues.  # DISPOSITION: # Retacrit today/tomorrow # in 4 weeks- MD; labs- cbc/cmp; PSA; D-2- Retacrit- Dr.B

## 2019-07-31 NOTE — Progress Notes (Signed)
[facility] OFFICE PROGRESS NOTE  Patient Care Team: [name], MD as PCP - General (Family Medicine) Bond, Tracie Harrier, MD as Consulting Physician (Ophthalmology) Abbie Sons, MD as Consulting Physician (Urology) Cammie Sickle, MD as Consulting Physician (Hematology and Oncology)  Cancer Staging No matching staging information was found for the patient.   Oncology History Overview Note  # external beam radiation therapy and radiation seed implant in 2005 with Dr. Joelyn Oms and Dr. Danny Lawless for T1c Gleason 3+4 = 7 prostate cancer with a pretreatment PSA of 6.8. In November 2015, he was found to have biochemical recurrence.  Dec 2016- CT a/p & Bone scan- NEG; PSA- 11; June 2017- 19; STARTED on Firmagon [Dr.Budzyn]; on Lupron q [place]  # JAN 2021-PSA rising-3.5.  Bone scan-progression of disease. START X-Tandi 120mg /day.   # OCT 2018- Anemia-? Etiology; Likely anemia of chronic disease [anti-CCP elevated/ but NO RA; rheumatology; ]BMBx- hypercellular 30%; relative myeloid hyperplasia/erythroid hypoplasia; FISH- 5/7/8-Normal. II OPINION at [facility]. [facility]- NEG  # CKD- III; Bladder neck obstruction s/p Foley [Dr.Stoiff]  # JAN 2018- Dexa scan- N  # NGS-P # PALLIATIVE CARE-P ------------------------------------   DIAGNOSIS: PROSTATE CANCER  STAGE:  IV       ;GOALS: control  CURRENT/MOST RECENT THERAPY: Lupron+ X-tandi    Primary adenocarcinoma of prostate ([place])      INTERVAL HISTORY:  Thomas Lopez 86 y.o.  male pleasant patient above history of metastatic prostate cancer to bone on Lupron/Xtandi; anemia iron deficiency/ CKD-is here for follow-up.  Patient denies any worsening bone pain.  Chronic joint pains.  Chronic mild to moderate fatigue.  Not any worse.  No blood in stools or black or stools.   Review of Systems  Constitutional: Positive for malaise/fatigue. Negative for chills, diaphoresis and fever.  HENT: Negative for  nosebleeds and sore throat.   Eyes: Negative for double vision.  Respiratory: Positive for shortness of breath. Negative for cough, hemoptysis, sputum production and wheezing.   Cardiovascular: Negative for chest pain, palpitations, orthopnea and leg swelling.  Gastrointestinal: Negative for abdominal pain, blood in stool, constipation, diarrhea, heartburn, melena, nausea and vomiting.  Genitourinary: Negative for dysuria, frequency and urgency.  Musculoskeletal: Positive for joint pain. Negative for back pain.  Skin: Negative.  Negative for itching and rash.  Neurological: Negative for dizziness, tingling, focal weakness, weakness and headaches.  Endo/Heme/Allergies: Does not bruise/bleed easily.  Psychiatric/Behavioral: Negative for depression. The patient is not nervous/anxious and does not have insomnia.       PAST MEDICAL HISTORY :  Past Medical History:  Diagnosis Date  . Aorto-iliac atherosclerosis ([place]) 04/2015   by CT scan  . Carotid stenosis 11/2014   mild bilateral 1-39%, rpt 2 yrs  . Colon polyps    rpt colonoscopy due 2014  . ED (erectile dysfunction)    Trimix and VED  . Esophagitis   . Essential hypertension 06/29/2009  . GERD (gastroesophageal reflux disease)   . Glaucoma    severe ([place])  . HLD (hyperlipidemia)   . HTN (hypertension)   . Hyperglycemia   . IBS (irritable bowel syndrome)   . Prostate cancer [facility]) 2005   s/p seed implant, EBRT ([facility]. Alinda Money at [facility]) recurrent treating with androgen deprivation referred to cancer center  . Smoking history     PAST SURGICAL HISTORY :   Past Surgical History:  Procedure Laterality Date  . CATARACT EXTRACTION Left 05/2017   Dr. Edilia Bo  . CATARACT EXTRACTION, BILATERAL  December  2016   [place], Dr. Raynelle Fanning  . COLONOSCOPY  03/01/01   Multiple polyps, divertic//adenomatous polyps  . COLONOSCOPY  04/24/01   multiple polyps  . COLONOSCOPY  10/23/01   Polyps benign-repeat every 2 years  .  COLONOSCOPY  06/23/04   Adenom/hyperplastic polyps; multiple external hemorrhoids  . COLONOSCOPY  06/08/07   single small polyp-repeat 5 years  . CYSTOSCOPY WITH INSERTION OF UROLIFT N/A 04/10/2018   Procedure: CYSTOSCOPY WITH INSERTION OF UROLIFT;  Surgeon: Abbie Sons, MD;  Location: [facility] ORS;  Service: Urology;  Laterality: N/A;  . ESOPHAGOGASTRODUODENOSCOPY  03/01/01   H.H.; gastritis esoph. dudodenitis  . NCS/Wrists Left Carpal  2/02   Min./right improved  . PFTs  10/2012   FVC 64%, FEV1 41%, ratio 0.62  . PROSTATE BIOPSY  12/04   positive; radioactive seed implant (Dr. Joelyn Oms)  . Prostate Ext Beam Radiation  1/27-07/23/03   Dr. Danny Lawless  . SPIROMETRY  09/2011   WNL  . TONSILLECTOMY    . Wrist Release  6/98; 9/99   Right    FAMILY HISTORY :   Family History  Problem Relation Age of Onset  . Cancer Mother        colon  . Hyperlipidemia Mother   . Other Mother        Carotid disease  . Coronary artery disease Neg Hx   . Stroke Neg Hx     SOCIAL HISTORY:   Social History   Tobacco Use  . Smoking status: Former Smoker    Quit date: 05/23/1993    Years since quitting: 26.2  . Smokeless tobacco: Never Used  Substance Use Topics  . Alcohol use: Yes    Comment: Rare  . Drug use: No    ALLERGIES:  has No Known Allergies.  MEDICATIONS:  Current Outpatient Medications  Medication Sig Dispense Refill  . aspirin EC 81 MG tablet Take 81 mg by mouth daily.    . Cholecalciferol (VITAMIN D3) 25 MCG (1000 UT) CAPS Take 1 capsule (1,000 Units total) by mouth daily. 30 capsule   . dorzolamide-timolol (COSOPT) 22.3-6.8 MG/ML ophthalmic solution Place 1 drop into the left eye 2 (two) times daily.    . ferrous sulfate 325 (65 FE) MG tablet Take 1 tablet (325 mg total) by mouth daily with breakfast.  3  . Leuprolide Acetate, 3 Month, (ELIGARD) 22.5 MG injection Inject 22.5 mg into the skin every 3 (three) months. 1 each 3  . lisinopril (ZESTRIL) 10 MG tablet Take 1 tablet (10 mg  total) by mouth daily. 90 tablet 3  . LUMIGAN 0.01 % SOLN     . omeprazole (PRILOSEC) 40 MG capsule TAKE 1 CAPSULE EVERY DAY 90 capsule 1  . pilocarpine (PILOCAR) 1 % ophthalmic solution Place 1 drop into the left eye 3 (three) times daily.    . rosuvastatin (CRESTOR) 10 MG tablet TAKE 1 TABLET EVERY DAY 90 tablet 1  . tamsulosin (FLOMAX) 0.4 MG CAPS capsule Take 0.4 mg by mouth. After supper    . timolol (TIMOPTIC) 0.5 % ophthalmic solution Place 1 drop into the left eye 2 (two) times daily.     . trospium (SANCTURA) 20 MG tablet TAKE ONE TABLET BY MOUTH EVERY NIGHT AT BEDTIME 30 tablet 0  . venlafaxine XR (EFFEXOR-XR) 37.5 MG 24 hr capsule TAKE 1 CAPSULE EVERY DAY WITH BREAKFAST 90 capsule 1  . XTANDI 40 MG capsule TAKE 3 CAPSULES (120 MG TOTAL) BY MOUTH DAILY. 90 capsule 3  No current facility-administered medications for this visit.    PHYSICAL EXAMINATION: ECOG PERFORMANCE STATUS: 1 - Symptomatic but completely ambulatory  BP 137/64   Pulse 79   Temp (!) 97.2 F (36.2 C) (Tympanic)   Resp 20   Ht 5\' 9"  (1.753 m)   Wt 159 lb 8 oz (72.3 kg)   BMI 23.55 kg/m   Filed Weights   07/31/19 1529  Weight: 159 lb 8 oz (72.3 kg)    Physical Exam  Constitutional: He is oriented to person, place, and time.  Thin built cachectic appearing male patient .  He is alone.  Walking by himself.  Appears pale.  HENT:  Head: Normocephalic and atraumatic.  Mouth/Throat: Oropharynx is clear and moist. No oropharyngeal exudate.  Eyes: Pupils are equal, round, and reactive to light.  Cardiovascular: Normal rate and regular rhythm.  Pulmonary/Chest: No respiratory distress. He has no wheezes.  Abdominal: Soft. Bowel sounds are normal. He exhibits no distension and no mass. There is no abdominal tenderness. There is no rebound and no guarding.  Musculoskeletal:        General: No tenderness or edema. Normal range of motion.     Cervical back: Normal range of motion and neck supple.   Neurological: He is alert and oriented to person, place, and time.  Skin: Skin is warm.  Psychiatric: Affect normal.   LABORATORY DATA:  I have reviewed the data as listed    Component Value Date/Time   NA 141 07/31/2019 1513   K 3.8 07/31/2019 1513   CL 105 07/31/2019 1513   CO2 23 07/31/2019 1513   GLUCOSE 190 (H) 07/31/2019 1513   BUN 39 (H) 07/31/2019 1513   CREATININE 1.51 (H) 07/31/2019 1513   CALCIUM 9.4 07/31/2019 1513   PROT 7.7 07/31/2019 1513   ALBUMIN 3.1 (L) 07/31/2019 1513   AST 19 07/31/2019 1513   ALT 11 07/31/2019 1513   ALKPHOS 75 07/31/2019 1513   BILITOT 0.5 07/31/2019 1513   GFRNONAA 42 (L) 07/31/2019 1513   GFRAA 48 (L) 07/31/2019 1513    No results found for: SPEP, UPEP  Lab Results  Component Value Date   WBC 9.4 07/31/2019   NEUTROABS 7.9 (H) 07/31/2019   HGB 9.1 (L) 07/31/2019   HCT 31.3 (L) 07/31/2019   MCV 90.5 07/31/2019   PLT 267 07/31/2019      Chemistry      Component Value Date/Time   NA 141 07/31/2019 1513   K 3.8 07/31/2019 1513   CL 105 07/31/2019 1513   CO2 23 07/31/2019 1513   BUN 39 (H) 07/31/2019 1513   CREATININE 1.51 (H) 07/31/2019 1513      Component Value Date/Time   CALCIUM 9.4 07/31/2019 1513   ALKPHOS 75 07/31/2019 1513   AST 19 07/31/2019 1513   ALT 11 07/31/2019 1513   BILITOT 0.5 07/31/2019 1513       RADIOGRAPHIC STUDIES: I have personally reviewed the radiological images as listed and agreed with the findings in the report. No results found.   ASSESSMENT & PLAN:  Primary adenocarcinoma of prostate [facility]) # Metastatic prostate cancer castrate resistant- on Lupron 22.5 mg ;January 2021 -bone- scan shows new lesions in right ilaic/ ribs/ calvarium- on xtandi 120 mg/day.  Patient tolerating well no progressive disease.  #Hemoglobin -9.3-  improved status post IV Venofer; proceed with Retacrit today.  #CKD stage III -GFR 48.  Stable.  bilateral hydro-nephrosis /hydroureter -followed closely by  urology.  # cough/ runny nose-  no fevers- ? Related to covid vaccine; OTC histamine as needed.   #Bone lesions-castrate resistant prostate cancer; will consider Zometa infusions every 3 months or so; concerned about patient's dental issues.  # DISPOSITION: # Retacrit today/tomorrow # in 4 weeks- MD; labs- cbc/cmp; PSA; D-2- Retacrit- Dr.B    Orders Placed This Encounter  Procedures  . CBC with Differential    Standing Status:   Future    Number of Occurrences:   1    Standing Expiration Date:   07/30/2020  . Comprehensive metabolic panel    Standing Status:   Future    Number of Occurrences:   1    Standing Expiration Date:   07/30/2020  . PSA    Standing Status:   Future    Number of Occurrences:   1    Standing Expiration Date:   07/30/2020   All questions were answered. The patient knows to call the clinic with any problems, questions or concerns.      Cammie Sickle, MD 08/02/2019 7:37 AM

## 2019-08-01 ENCOUNTER — Inpatient Hospital Stay: Payer: Medicare PPO

## 2019-08-15 MED FILL — XTANDI 40 MG CAPSULE: 40 | 30 days supply | Qty: 90 | Fill #1

## 2019-08-20 ENCOUNTER — Ambulatory Visit: Payer: Medicare PPO | Attending: Internal Medicine

## 2019-08-20 DIAGNOSIS — Z23 Encounter for immunization: Secondary | ICD-10-CM

## 2019-08-20 NOTE — Progress Notes (Signed)
   Covid-19 Vaccination Clinic  Name:  Thomas Lopez    MRN: [id number] DOB: 04/02/1933  08/20/2019  Thomas Lopez was observed post Covid-19 immunization for 15 minutes without incident. He was provided with Vaccine Information Sheet and instruction to access the V-Safe system.   Thomas Lopez was instructed to call 911 with any severe reactions post vaccine: Marland Kitchen Difficulty breathing  . Swelling of face and throat  . A fast heartbeat  . A bad rash all over body  . Dizziness and weakness   Immunizations Administered    Name Date Dose VIS Date Route   Pfizer COVID-19 Vaccine 08/20/2019  9:06 AM 0.3 mL 05/03/2019 Intramuscular   Manufacturer: Bertha   Lot: [id number]   Purple Sage: [id number]

## 2019-08-27 ENCOUNTER — Other Ambulatory Visit: Payer: Self-pay | Admitting: *Deleted

## 2019-08-27 DIAGNOSIS — C61 Malignant neoplasm of prostate: Secondary | ICD-10-CM

## 2019-08-27 DIAGNOSIS — D631 Anemia in chronic kidney disease: Secondary | ICD-10-CM

## 2019-08-27 DIAGNOSIS — D649 Anemia, unspecified: Secondary | ICD-10-CM

## 2019-08-28 ENCOUNTER — Inpatient Hospital Stay (HOSPITAL_BASED_OUTPATIENT_CLINIC_OR_DEPARTMENT_OTHER): Payer: Medicare PPO | Admitting: Internal Medicine

## 2019-08-28 ENCOUNTER — Inpatient Hospital Stay: Payer: Medicare PPO

## 2019-08-28 ENCOUNTER — Inpatient Hospital Stay: Payer: Medicare PPO | Attending: Internal Medicine

## 2019-08-28 ENCOUNTER — Encounter: Payer: Self-pay | Admitting: Internal Medicine

## 2019-08-28 ENCOUNTER — Other Ambulatory Visit: Payer: Self-pay

## 2019-08-28 DIAGNOSIS — C61 Malignant neoplasm of prostate: Secondary | ICD-10-CM | POA: Diagnosis not present

## 2019-08-28 DIAGNOSIS — Z79899 Other long term (current) drug therapy: Secondary | ICD-10-CM | POA: Insufficient documentation

## 2019-08-28 DIAGNOSIS — Z87891 Personal history of nicotine dependence: Secondary | ICD-10-CM | POA: Insufficient documentation

## 2019-08-28 DIAGNOSIS — E785 Hyperlipidemia, unspecified: Secondary | ICD-10-CM | POA: Insufficient documentation

## 2019-08-28 DIAGNOSIS — Z79818 Long term (current) use of other agents affecting estrogen receptors and estrogen levels: Secondary | ICD-10-CM | POA: Insufficient documentation

## 2019-08-28 DIAGNOSIS — K219 Gastro-esophageal reflux disease without esophagitis: Secondary | ICD-10-CM | POA: Diagnosis not present

## 2019-08-28 DIAGNOSIS — R5381 Other malaise: Secondary | ICD-10-CM | POA: Insufficient documentation

## 2019-08-28 DIAGNOSIS — D509 Iron deficiency anemia, unspecified: Secondary | ICD-10-CM | POA: Insufficient documentation

## 2019-08-28 DIAGNOSIS — I129 Hypertensive chronic kidney disease with stage 1 through stage 4 chronic kidney disease, or unspecified chronic kidney disease: Secondary | ICD-10-CM | POA: Insufficient documentation

## 2019-08-28 DIAGNOSIS — R5383 Other fatigue: Secondary | ICD-10-CM | POA: Diagnosis not present

## 2019-08-28 DIAGNOSIS — D631 Anemia in chronic kidney disease: Secondary | ICD-10-CM

## 2019-08-28 DIAGNOSIS — Z7982 Long term (current) use of aspirin: Secondary | ICD-10-CM | POA: Diagnosis not present

## 2019-08-28 DIAGNOSIS — C7931 Secondary malignant neoplasm of brain: Secondary | ICD-10-CM | POA: Insufficient documentation

## 2019-08-28 DIAGNOSIS — Z923 Personal history of irradiation: Secondary | ICD-10-CM | POA: Diagnosis not present

## 2019-08-28 DIAGNOSIS — C7951 Secondary malignant neoplasm of bone: Secondary | ICD-10-CM

## 2019-08-28 DIAGNOSIS — N183 Chronic kidney disease, stage 3 unspecified: Secondary | ICD-10-CM | POA: Insufficient documentation

## 2019-08-28 LAB — CBC WITH DIFFERENTIAL/PLATELET
Abs Immature Granulocytes: 0.03 10*3/uL (ref 0.00–0.07)
Basophils Absolute: 0 10*3/uL (ref 0.0–0.1)
Basophils Relative: 0 %
Eosinophils Absolute: 0.1 10*3/uL (ref 0.0–0.5)
Eosinophils Relative: 1 %
HCT: 34.8 % — ABNORMAL LOW (ref 39.0–52.0)
Hemoglobin: 10.8 g/dL — ABNORMAL LOW (ref 13.0–17.0)
Immature Granulocytes: 0 %
Lymphocytes Relative: 15 %
Lymphs Abs: 1.2 10*3/uL (ref 0.7–4.0)
MCH: 27.6 pg (ref 26.0–34.0)
MCHC: 31 g/dL (ref 30.0–36.0)
MCV: 89 fL (ref 80.0–100.0)
Monocytes Absolute: 0.4 10*3/uL (ref 0.1–1.0)
Monocytes Relative: 5 %
Neutro Abs: 6 10*3/uL (ref 1.7–7.7)
Neutrophils Relative %: 79 %
Platelets: 250 10*3/uL (ref 150–400)
RBC: 3.91 MIL/uL — ABNORMAL LOW (ref 4.22–5.81)
RDW: 17.2 % — ABNORMAL HIGH (ref 11.5–15.5)
WBC: 7.7 10*3/uL (ref 4.0–10.5)
nRBC: 0 % (ref 0.0–0.2)

## 2019-08-28 LAB — COMPREHENSIVE METABOLIC PANEL
ALT: 11 U/L (ref 0–44)
AST: 18 U/L (ref 15–41)
Albumin: 3.8 g/dL (ref 3.5–5.0)
Alkaline Phosphatase: 86 U/L (ref 38–126)
Anion gap: 12 (ref 5–15)
BUN: 32 mg/dL — ABNORMAL HIGH (ref 8–23)
CO2: 24 mmol/L (ref 22–32)
Calcium: 9.9 mg/dL (ref 8.9–10.3)
Chloride: 105 mmol/L (ref 98–111)
Creatinine, Ser: 1.48 mg/dL — ABNORMAL HIGH (ref 0.61–1.24)
GFR calc Af Amer: 50 mL/min — ABNORMAL LOW (ref >60)
GFR calc non Af Amer: 43 mL/min — ABNORMAL LOW (ref >60)
Glucose, Bld: 166 mg/dL — ABNORMAL HIGH (ref 70–99)
Potassium: 4.5 mmol/L (ref 3.5–5.1)
Sodium: 141 mmol/L (ref 135–145)
Total Bilirubin: 0.5 mg/dL (ref 0.3–1.2)
Total Protein: 8.4 g/dL — ABNORMAL HIGH (ref 6.5–8.1)

## 2019-08-28 LAB — PSA: Prostatic Specific Antigen: 1.88 ng/mL (ref 0.00–4.00)

## 2019-08-28 NOTE — Progress Notes (Signed)
[place] OFFICE PROGRESS NOTE  Patient Care Team: [name], MD as PCP - General (Family Medicine) Bond, Tracie Harrier, MD as Consulting Physician (Ophthalmology) Abbie Sons, MD as Consulting Physician (Urology) Cammie Sickle, MD as Consulting Physician (Hematology and Oncology)  Cancer Staging No matching staging information was found for the patient.   Oncology History Overview Note  # external beam radiation therapy and radiation seed implant in 2005 with Dr. Joelyn Oms and Dr. Danny Lawless for T1c Gleason 3+4 = 7 prostate cancer with a pretreatment PSA of 6.8. In November 2015, he was found to have biochemical recurrence.  Dec 2016- CT a/p & Bone scan- NEG; PSA- 11; June 2017- 19; STARTED on Firmagon [Dr.Budzyn]; on Lupron q [place]  # JAN 2021-PSA rising-3.5.  Bone scan-progression of disease. START X-Tandi 120mg /day.   # OCT 2018- Anemia-? Etiology; Likely anemia of chronic disease [anti-CCP elevated/ but NO RA; rheumatology; ]BMBx- hypercellular 30%; relative myeloid hyperplasia/erythroid hypoplasia; FISH- 5/7/8-Normal. II OPINION at [facility]. [facility] HEM- NEG  # CKD- III; Bladder neck obstruction s/p Foley [Dr.Stoiff]  # JAN 2018- Dexa scan- N  # NGS-P # PALLIATIVE CARE-P ------------------------------------   DIAGNOSIS: PROSTATE CANCER  STAGE:  IV       ;GOALS: control  CURRENT/MOST RECENT THERAPY: Lupron+ X-tandi    Primary adenocarcinoma of prostate ([place])      INTERVAL HISTORY:  Thomas Lopez 86 y.o.  male pleasant patient above history of metastatic prostate cancer to bone on Lupron/Xtandi; anemia iron deficiency/ CKD-is here for follow-up.  Patient denies any worsening fatigue.  Denies any blood in stools or black or stools.  Denies any worsening pain.  Chronic joint pains.   Review of Systems  Constitutional: Positive for malaise/fatigue. Negative for chills, diaphoresis and fever.  HENT: Negative for nosebleeds and sore  throat.   Eyes: Negative for double vision.  Respiratory: Positive for shortness of breath. Negative for cough, hemoptysis, sputum production and wheezing.   Cardiovascular: Negative for chest pain, palpitations, orthopnea and leg swelling.  Gastrointestinal: Negative for abdominal pain, blood in stool, constipation, diarrhea, heartburn, melena, nausea and vomiting.  Genitourinary: Negative for dysuria, frequency and urgency.  Musculoskeletal: Positive for joint pain. Negative for back pain.  Skin: Negative.  Negative for itching and rash.  Neurological: Negative for dizziness, tingling, focal weakness, weakness and headaches.  Endo/Heme/Allergies: Does not bruise/bleed easily.  Psychiatric/Behavioral: Negative for depression. The patient is not nervous/anxious and does not have insomnia.       PAST MEDICAL HISTORY :  Past Medical History:  Diagnosis Date  . Aorto-iliac atherosclerosis ([place]) 04/2015   by CT scan  . Carotid stenosis 11/2014   mild bilateral 1-39%, rpt 2 yrs  . Colon polyps    rpt colonoscopy due 2014  . ED (erectile dysfunction)    Trimix and VED  . Esophagitis   . Essential hypertension 06/29/2009  . GERD (gastroesophageal reflux disease)   . Glaucoma    severe ([place])  . HLD (hyperlipidemia)   . HTN (hypertension)   . Hyperglycemia   . IBS (irritable bowel syndrome)   . Prostate cancer [facility]) 2005   s/p seed implant, EBRT (Dr. Alinda Money at [facility]) recurrent treating with androgen deprivation referred to cancer center  . Smoking history     PAST SURGICAL HISTORY :   Past Surgical History:  Procedure Laterality Date  . CATARACT EXTRACTION Left 05/2017   Dr. Edilia Bo  . CATARACT EXTRACTION, BILATERAL  December 2016   [facility], Dr. Raynelle Fanning  . COLONOSCOPY  03/01/01   Multiple polyps, divertic//adenomatous polyps  . COLONOSCOPY  04/24/01   multiple polyps  . COLONOSCOPY  10/23/01   Polyps benign-repeat every 2 years  . COLONOSCOPY  06/23/04    Adenom/hyperplastic polyps; multiple external hemorrhoids  . COLONOSCOPY  06/08/07   single small polyp-repeat 5 years  . CYSTOSCOPY WITH INSERTION OF UROLIFT N/A 04/10/2018   Procedure: CYSTOSCOPY WITH INSERTION OF UROLIFT;  Surgeon: Abbie Sons, MD;  Location: [facility] ORS;  Service: Urology;  Laterality: N/A;  . ESOPHAGOGASTRODUODENOSCOPY  03/01/01   H.H.; gastritis esoph. dudodenitis  . NCS/Wrists Left Carpal  2/02   Min./right improved  . PFTs  10/2012   FVC 64%, FEV1 41%, ratio 0.62  . PROSTATE BIOPSY  12/04   positive; radioactive seed implant (Dr. Joelyn Oms)  . Prostate Ext Beam Radiation  1/27-07/23/03   Dr. Danny Lawless  . SPIROMETRY  09/2011   WNL  . TONSILLECTOMY    . Wrist Release  6/98; 9/99   Right    FAMILY HISTORY :   Family History  Problem Relation Age of Onset  . Cancer Mother        colon  . Hyperlipidemia Mother   . Other Mother        Carotid disease  . Coronary artery disease Neg Hx   . Stroke Neg Hx     SOCIAL HISTORY:   Social History   Tobacco Use  . Smoking status: Former Smoker    Quit date: 05/23/1993    Years since quitting: 26.2  . Smokeless tobacco: Never Used  Substance Use Topics  . Alcohol use: Yes    Comment: Rare  . Drug use: No    ALLERGIES:  has No Known Allergies.  MEDICATIONS:  Current Outpatient Medications  Medication Sig Dispense Refill  . aspirin EC 81 MG tablet Take 81 mg by mouth daily.    . Cholecalciferol (VITAMIN D3) 25 MCG (1000 UT) CAPS Take 1 capsule (1,000 Units total) by mouth daily. 30 capsule   . dorzolamide-timolol (COSOPT) 22.3-6.8 MG/ML ophthalmic solution Place 1 drop into the left eye 2 (two) times daily.    . ferrous sulfate 325 (65 FE) MG tablet Take 1 tablet (325 mg total) by mouth daily with breakfast.  3  . Leuprolide Acetate, 3 Month, (ELIGARD) 22.5 MG injection Inject 22.5 mg into the skin every 3 (three) months. 1 each 3  . lisinopril (ZESTRIL) 10 MG tablet Take 1 tablet (10 mg total) by mouth daily.  90 tablet 3  . LUMIGAN 0.01 % SOLN     . omeprazole (PRILOSEC) 40 MG capsule TAKE 1 CAPSULE EVERY DAY 90 capsule 1  . pilocarpine (PILOCAR) 1 % ophthalmic solution Place 1 drop into the left eye 3 (three) times daily.    . rosuvastatin (CRESTOR) 10 MG tablet TAKE 1 TABLET EVERY DAY 90 tablet 1  . tamsulosin (FLOMAX) 0.4 MG CAPS capsule Take 0.4 mg by mouth. After supper    . timolol (TIMOPTIC) 0.5 % ophthalmic solution Place 1 drop into the left eye 2 (two) times daily.     . trospium (SANCTURA) 20 MG tablet TAKE ONE TABLET BY MOUTH EVERY NIGHT AT BEDTIME 30 tablet 0  . venlafaxine XR (EFFEXOR-XR) 37.5 MG 24 hr capsule TAKE 1 CAPSULE EVERY DAY WITH BREAKFAST 90 capsule 1  . XTANDI 40 MG capsule TAKE 3 CAPSULES (120 MG TOTAL) BY MOUTH DAILY. 90 capsule 3   No current facility-administered  medications for this visit.    PHYSICAL EXAMINATION: ECOG PERFORMANCE STATUS: 1 - Symptomatic but completely ambulatory  BP (!) 106/54 (BP Location: Left Arm, Patient Position: Sitting, Cuff Size: Normal)   Pulse 62   Temp 97.6 F (36.4 C) (Tympanic)   Wt 138 lb (62.6 kg)   BMI 20.38 kg/m   Filed Weights   08/28/19 1441  Weight: 138 lb (62.6 kg)    Physical Exam  Constitutional: He is oriented to person, place, and time.  Thin built cachectic appearing male patient .  He is alone.  Walking by himself.  Appears pale.  HENT:  Head: Normocephalic and atraumatic.  Mouth/Throat: Oropharynx is clear and moist. No oropharyngeal exudate.  Eyes: Pupils are equal, round, and reactive to light.  Cardiovascular: Normal rate and regular rhythm.  Pulmonary/Chest: No respiratory distress. He has no wheezes.  Abdominal: Soft. Bowel sounds are normal. He exhibits no distension and no mass. There is no abdominal tenderness. There is no rebound and no guarding.  Musculoskeletal:        General: No tenderness or edema. Normal range of motion.     Cervical back: Normal range of motion and neck supple.   Neurological: He is alert and oriented to person, place, and time.  Skin: Skin is warm.  Psychiatric: Affect normal.   LABORATORY DATA:  I have reviewed the data as listed    Component Value Date/Time   NA 141 08/28/2019 1422   K 4.5 08/28/2019 1422   CL 105 08/28/2019 1422   CO2 24 08/28/2019 1422   GLUCOSE 166 (H) 08/28/2019 1422   BUN 32 (H) 08/28/2019 1422   CREATININE 1.48 (H) 08/28/2019 1422   CALCIUM 9.9 08/28/2019 1422   PROT 8.4 (H) 08/28/2019 1422   ALBUMIN 3.8 08/28/2019 1422   AST 18 08/28/2019 1422   ALT 11 08/28/2019 1422   ALKPHOS 86 08/28/2019 1422   BILITOT 0.5 08/28/2019 1422   GFRNONAA 43 (L) 08/28/2019 1422   GFRAA 50 (L) 08/28/2019 1422    No results found for: SPEP, UPEP  Lab Results  Component Value Date   WBC 7.7 08/28/2019   NEUTROABS 6.0 08/28/2019   HGB 10.8 (L) 08/28/2019   HCT 34.8 (L) 08/28/2019   MCV 89.0 08/28/2019   PLT 250 08/28/2019      Chemistry      Component Value Date/Time   NA 141 08/28/2019 1422   K 4.5 08/28/2019 1422   CL 105 08/28/2019 1422   CO2 24 08/28/2019 1422   BUN 32 (H) 08/28/2019 1422   CREATININE 1.48 (H) 08/28/2019 1422      Component Value Date/Time   CALCIUM 9.9 08/28/2019 1422   ALKPHOS 86 08/28/2019 1422   AST 18 08/28/2019 1422   ALT 11 08/28/2019 1422   BILITOT 0.5 08/28/2019 1422       RADIOGRAPHIC STUDIES: I have personally reviewed the radiological images as listed and agreed with the findings in the report. No results found.   ASSESSMENT & PLAN:  Primary adenocarcinoma of prostate [facility]) # Metastatic prostate cancer castrate resistant- on Lupron 22.5 mg ;January 2021 -bone- scan shows new lesions in right ilaic/ ribs/ calvarium- on xtandi 120 mg/day.   PSA- improving;MARCH 2021- 0.5.  Clinically stable.  PSA from today pending.  #Hemoglobin -10.3   improved status post IV Venofer; HOLD Retacrit today.  #CKD stage III -GFR 48.  Stable.  bilateral hydro-nephrosis /hydroureter -followed  closely by urology.  # cough/ runny nose- no fevers- ?  Related to covid vaccine; OTC histamine as needed.   #Bone lesions-castrate resistant prostate cancer; will consider Zometa infusions every 3 months or so; concerned about patient's dental issues.  # DISPOSITION: # NO Retacrit today # in 4 weeks- MD; labs- cbc/cmp; PSA;Eligard/ Retacrit; Zometa Dr.B    Orders Placed This Encounter  Procedures  . CBC with Differential    Standing Status:   Future    Standing Expiration Date:   08/27/2020  . Comprehensive metabolic panel    Standing Status:   Future    Standing Expiration Date:   08/27/2020  . PSA    Standing Status:   Future    Standing Expiration Date:   08/27/2020   All questions were answered. The patient knows to call the clinic with any problems, questions or concerns.      Cammie Sickle, MD 08/29/2019 8:07 AM

## 2019-08-28 NOTE — Assessment & Plan Note (Addendum)
#   Metastatic prostate cancer castrate resistant- on Lupron 22.5 mg ;January 2021 -bone- scan shows new lesions in right ilaic/ ribs/ calvarium- on xtandi 120 mg/day.   PSA- improving;MARCH 2021- 0.5.  Clinically stable.  PSA from today pending.  #Hemoglobin -10.3   improved status post IV Venofer; HOLD Retacrit today.  #CKD stage III -GFR 48.  Stable.  bilateral hydro-nephrosis /hydroureter -followed closely by urology.  #Bone lesions-castrate resistant prostate cancer; will consider Zometa infusions every 3 months or so; concerned about patient's dental issues.  # DISPOSITION: # NO Retacrit today # in 4 weeks- MD; labs- cbc/cmp; PSA;Eligard/ Retacrit; Zometa Dr.B

## 2019-09-12 MED FILL — XTANDI 40 MG CAPSULE: 40 | 30 days supply | Qty: 90 | Fill #2

## 2019-09-25 ENCOUNTER — Other Ambulatory Visit: Payer: Self-pay

## 2019-09-25 ENCOUNTER — Inpatient Hospital Stay: Payer: Medicare PPO | Attending: Internal Medicine

## 2019-09-25 ENCOUNTER — Inpatient Hospital Stay: Payer: Medicare PPO

## 2019-09-25 ENCOUNTER — Inpatient Hospital Stay (HOSPITAL_BASED_OUTPATIENT_CLINIC_OR_DEPARTMENT_OTHER): Payer: Medicare PPO | Admitting: Internal Medicine

## 2019-09-25 ENCOUNTER — Encounter: Payer: Self-pay | Admitting: Internal Medicine

## 2019-09-25 VITALS — BP 130/67 | HR 60 | Temp 96.4°F | Wt 134.0 lb

## 2019-09-25 DIAGNOSIS — Z79818 Long term (current) use of other agents affecting estrogen receptors and estrogen levels: Secondary | ICD-10-CM | POA: Insufficient documentation

## 2019-09-25 DIAGNOSIS — E1122 Type 2 diabetes mellitus with diabetic chronic kidney disease: Secondary | ICD-10-CM | POA: Diagnosis not present

## 2019-09-25 DIAGNOSIS — Z79899 Other long term (current) drug therapy: Secondary | ICD-10-CM | POA: Diagnosis not present

## 2019-09-25 DIAGNOSIS — D649 Anemia, unspecified: Secondary | ICD-10-CM

## 2019-09-25 DIAGNOSIS — I129 Hypertensive chronic kidney disease with stage 1 through stage 4 chronic kidney disease, or unspecified chronic kidney disease: Secondary | ICD-10-CM | POA: Diagnosis not present

## 2019-09-25 DIAGNOSIS — C61 Malignant neoplasm of prostate: Secondary | ICD-10-CM | POA: Insufficient documentation

## 2019-09-25 DIAGNOSIS — R5383 Other fatigue: Secondary | ICD-10-CM | POA: Diagnosis not present

## 2019-09-25 DIAGNOSIS — C7951 Secondary malignant neoplasm of bone: Secondary | ICD-10-CM | POA: Diagnosis not present

## 2019-09-25 DIAGNOSIS — D509 Iron deficiency anemia, unspecified: Secondary | ICD-10-CM | POA: Diagnosis not present

## 2019-09-25 DIAGNOSIS — R634 Abnormal weight loss: Secondary | ICD-10-CM

## 2019-09-25 DIAGNOSIS — H409 Unspecified glaucoma: Secondary | ICD-10-CM | POA: Insufficient documentation

## 2019-09-25 DIAGNOSIS — Z7982 Long term (current) use of aspirin: Secondary | ICD-10-CM | POA: Insufficient documentation

## 2019-09-25 DIAGNOSIS — N183 Chronic kidney disease, stage 3 unspecified: Secondary | ICD-10-CM | POA: Insufficient documentation

## 2019-09-25 DIAGNOSIS — E785 Hyperlipidemia, unspecified: Secondary | ICD-10-CM | POA: Diagnosis not present

## 2019-09-25 DIAGNOSIS — Z87891 Personal history of nicotine dependence: Secondary | ICD-10-CM | POA: Diagnosis not present

## 2019-09-25 LAB — CBC WITH DIFFERENTIAL/PLATELET
Abs Immature Granulocytes: 0.01 10*3/uL (ref 0.00–0.07)
Basophils Absolute: 0 10*3/uL (ref 0.0–0.1)
Basophils Relative: 0 %
Eosinophils Absolute: 0 10*3/uL (ref 0.0–0.5)
Eosinophils Relative: 1 %
HCT: 33.1 % — ABNORMAL LOW (ref 39.0–52.0)
Hemoglobin: 10.4 g/dL — ABNORMAL LOW (ref 13.0–17.0)
Immature Granulocytes: 0 %
Lymphocytes Relative: 14 %
Lymphs Abs: 0.8 10*3/uL (ref 0.7–4.0)
MCH: 28 pg (ref 26.0–34.0)
MCHC: 31.4 g/dL (ref 30.0–36.0)
MCV: 89.2 fL (ref 80.0–100.0)
Monocytes Absolute: 0.6 10*3/uL (ref 0.1–1.0)
Monocytes Relative: 9 %
Neutro Abs: 4.6 10*3/uL (ref 1.7–7.7)
Neutrophils Relative %: 76 %
Platelets: 223 10*3/uL (ref 150–400)
RBC: 3.71 MIL/uL — ABNORMAL LOW (ref 4.22–5.81)
RDW: 16.8 % — ABNORMAL HIGH (ref 11.5–15.5)
WBC: 6.1 10*3/uL (ref 4.0–10.5)
nRBC: 0 % (ref 0.0–0.2)

## 2019-09-25 LAB — COMPREHENSIVE METABOLIC PANEL
ALT: 9 U/L (ref 0–44)
AST: 15 U/L (ref 15–41)
Albumin: 3.8 g/dL (ref 3.5–5.0)
Alkaline Phosphatase: 78 U/L (ref 38–126)
Anion gap: 11 (ref 5–15)
BUN: 48 mg/dL — ABNORMAL HIGH (ref 8–23)
CO2: 25 mmol/L (ref 22–32)
Calcium: 9.3 mg/dL (ref 8.9–10.3)
Chloride: 104 mmol/L (ref 98–111)
Creatinine, Ser: 1.44 mg/dL — ABNORMAL HIGH (ref 0.61–1.24)
GFR calc Af Amer: 51 mL/min — ABNORMAL LOW (ref >60)
GFR calc non Af Amer: 44 mL/min — ABNORMAL LOW (ref >60)
Glucose, Bld: 110 mg/dL — ABNORMAL HIGH (ref 70–99)
Potassium: 5 mmol/L (ref 3.5–5.1)
Sodium: 140 mmol/L (ref 135–145)
Total Bilirubin: 0.6 mg/dL (ref 0.3–1.2)
Total Protein: 7.7 g/dL (ref 6.5–8.1)

## 2019-09-25 LAB — PSA: Prostatic Specific Antigen: 0.14 ng/mL (ref 0.00–4.00)

## 2019-09-25 MED ORDER — LEUPROLIDE ACETATE (3 MONTH) 22.5 MG ~~LOC~~ KIT
22.5000 mg | PACK | Freq: Once | SUBCUTANEOUS | Status: AC
  Administered 2019-09-25: 14:00:00 22.5 mg via SUBCUTANEOUS
  Filled 2019-09-25: qty 22.5

## 2019-09-25 MED ORDER — SODIUM CHLORIDE 0.9 % IV SOLN
Freq: Once | INTRAVENOUS | Status: AC
  Filled 2019-09-25: qty 250

## 2019-09-25 MED ORDER — ZOLEDRONIC ACID 4 MG/5ML IV CONC
3.0000 mg | Freq: Once | INTRAVENOUS | Status: AC
  Administered 2019-09-25: 14:00:00 3 mg via INTRAVENOUS
  Filled 2019-09-25: qty 3.75

## 2019-09-25 NOTE — Progress Notes (Signed)
Creatinine 1.44. Per Lennox Grumbles RN per Dr, Rogue Bussing okay to proceed with Zometa, no Retacrit and pt to receive Eligard.   1429: Pt tolerated treatment well. No s/s of distress noted. Pt stable at discharge.

## 2019-09-25 NOTE — Assessment & Plan Note (Addendum)
#   Metastatic prostate cancer castrate resistant- on Lupron 22.5 mg ;January 2021 -bone- scan shows new lesions in right ilaic/ ribs/ calvarium- on xtandi 120 mg/day.   PSA- improving;MARCH 2021- 1.88.   # Clinically stable.  PSA from today pending.  Continue Xtandi.  Given the weight loss I would recommend PET scan for further evaluation.  #Hemoglobin -10.3- STABLE; s/p IV Venofer; HOLD Retacrit today.  #CKD stage III -GFR 48.  Stable.  Bilateral hydro-nephrosis /hydroureter -followed closely by urology.  #Bone lesions-castrate resistant prostate cancer; start Zometa infusions every 3 months.  # Weight loss- 25 pounds in last 2 months. [facility] referral. PEt scan as above.   # DISPOSITION: # referal to [facility]- re: weight loss # NO Retacrit today; Eligard today; Zometa # in 5 weeks- MD; labs- cbc/cmp; PSA; PET scan prior-Dr.B

## 2019-09-25 NOTE — Progress Notes (Signed)
[place] OFFICE PROGRESS NOTE  Patient Care Team: [name], MD as PCP - General (Family Medicine) Bond, Tracie Harrier, MD as Consulting Physician (Ophthalmology) Abbie Sons, MD as Consulting Physician (Urology) Cammie Sickle, MD as Consulting Physician (Hematology and Oncology)  Cancer Staging No matching staging information was found for the patient.   Oncology History Overview Note  # external beam radiation therapy and radiation seed implant in 2005 with Dr. Joelyn Oms and Dr. Danny Lawless for T1c Gleason 3+4 = 7 prostate cancer with a pretreatment PSA of 6.8. In November 2015, he was found to have biochemical recurrence.  Dec 2016- CT a/p & Bone scan- NEG; PSA- 11; June 2017- 19; STARTED on Firmagon [Dr.Budzyn]; on Lupron q [place]  # JAN 2021-PSA rising-3.5.  Bone scan-progression of disease. START X-Tandi 120mg /day.   # OCT 2018- Anemia-? Etiology; Likely anemia of chronic disease [anti-CCP elevated/ but NO RA; rheumatology; ]BMBx- hypercellular 30%; relative myeloid hyperplasia/erythroid hypoplasia; FISH- 5/7/8-Normal. II OPINION at [facility]. [facility] H[facility]- NEG  # CKD- III; Bladder neck obstruction s/p Foley [Dr.Stoiff]  # JAN 2018- Dexa scan- N  # NGS-P # PALLIATIVE CARE-P ------------------------------------   DIAGNOSIS: PROSTATE CANCER  STAGE:  IV       ;GOALS: control  CURRENT/MOST RECENT THERAPY: Lupron+ X-tandi    Primary adenocarcinoma of prostate ([place])      INTERVAL HISTORY:  [name] 86 y.o.  male pleasant patient above history of metastatic prostate cancer to bone on Lupron/Xtandi; anemia iron deficiency/ CKD-is here for follow-up.  Patient has lost about 25 pounds in the last 2 months.  Patient states his appetite is fair.  Denies any pain.  Chronic mild fatigue.    Review of Systems  Constitutional: Positive for malaise/fatigue and weight loss. Negative for chills, diaphoresis and fever.  HENT: Negative for  nosebleeds and sore throat.   Eyes: Negative for double vision.  Respiratory: Positive for shortness of breath. Negative for cough, hemoptysis, sputum production and wheezing.   Cardiovascular: Negative for chest pain, palpitations, orthopnea and leg swelling.  Gastrointestinal: Negative for abdominal pain, blood in stool, constipation, diarrhea, heartburn, melena, nausea and vomiting.  Genitourinary: Negative for dysuria, frequency and urgency.  Musculoskeletal: Positive for joint pain. Negative for back pain.  Skin: Negative.  Negative for itching and rash.  Neurological: Negative for dizziness, tingling, focal weakness, weakness and headaches.  Endo/Heme/Allergies: Does not bruise/bleed easily.  Psychiatric/Behavioral: Negative for depression. The patient is not nervous/anxious and does not have insomnia.       PAST MEDICAL HISTORY :  Past Medical History:  Diagnosis Date  . Aorto-iliac atherosclerosis ([place]) 04/2015   by CT scan  . Carotid stenosis 11/2014   mild bilateral 1-39%, rpt 2 yrs  . Colon polyps    rpt colonoscopy due 2014  . ED (erectile dysfunction)    Trimix and VED  . Esophagitis   . Essential hypertension 06/29/2009  . GERD (gastroesophageal reflux disease)   . Glaucoma    severe ([place])  . HLD (hyperlipidemia)   . HTN (hypertension)   . Hyperglycemia   . IBS (irritable bowel syndrome)   . Prostate cancer [facility]) 2005   s/p seed implant, EBRT (Dr. Alinda Money at [facility]) recurrent treating with androgen deprivation referred to cancer center  . Smoking history     PAST SURGICAL HISTORY :   Past Surgical History:  Procedure Laterality Date  . CATARACT EXTRACTION Left 05/2017   Dr. Edilia Bo  . CATARACT EXTRACTION, BILATERAL  December 2016   [place], Dr. Raynelle Fanning  . COLONOSCOPY  03/01/01   Multiple polyps, divertic//adenomatous polyps  . COLONOSCOPY  04/24/01   multiple polyps  . COLONOSCOPY  10/23/01   Polyps benign-repeat every 2 years  .  COLONOSCOPY  06/23/04   Adenom/hyperplastic polyps; multiple external hemorrhoids  . COLONOSCOPY  06/08/07   single small polyp-repeat 5 years  . CYSTOSCOPY WITH INSERTION OF UROLIFT N/A 04/10/2018   Procedure: CYSTOSCOPY WITH INSERTION OF UROLIFT;  Surgeon: Abbie Sons, MD;  Location: [facility] ORS;  Service: Urology;  Laterality: N/A;  . ESOPHAGOGASTRODUODENOSCOPY  03/01/01   H.H.; gastritis esoph. dudodenitis  . NCS/Wrists Left Carpal  2/02   Min./right improved  . PFTs  10/2012   FVC 64%, FEV1 41%, ratio 0.62  . PROSTATE BIOPSY  12/04   positive; radioactive seed implant (Dr. Joelyn Oms)  . Prostate Ext Beam Radiation  1/27-07/23/03   Dr. Danny Lawless  . SPIROMETRY  09/2011   WNL  . TONSILLECTOMY    . Wrist Release  6/98; 9/99   Right    FAMILY HISTORY :   Family History  Problem Relation Age of Onset  . Cancer Mother        colon  . Hyperlipidemia Mother   . Other Mother        Carotid disease  . Coronary artery disease Neg Hx   . Stroke Neg Hx     SOCIAL HISTORY:   Social History   Tobacco Use  . Smoking status: Former Smoker    Quit date: 05/23/1993    Years since quitting: 26.3  . Smokeless tobacco: Never Used  Substance Use Topics  . Alcohol use: Yes    Comment: Rare  . Drug use: No    ALLERGIES:  has No Known Allergies.  MEDICATIONS:  Current Outpatient Medications  Medication Sig Dispense Refill  . aspirin EC 81 MG tablet Take 81 mg by mouth daily.    . Cholecalciferol (VITAMIN D3) 25 MCG (1000 UT) CAPS Take 1 capsule (1,000 Units total) by mouth daily. 30 capsule   . dorzolamide-timolol (COSOPT) 22.3-6.8 MG/ML ophthalmic solution Place 1 drop into the left eye 2 (two) times daily.    . ferrous sulfate 325 (65 FE) MG tablet Take 1 tablet (325 mg total) by mouth daily with breakfast.  3  . Leuprolide Acetate, 3 Month, (ELIGARD) 22.5 MG injection Inject 22.5 mg into the skin every 3 (three) months. 1 each 3  . lisinopril (ZESTRIL) 10 MG tablet Take 1 tablet (10 mg  total) by mouth daily. 90 tablet 3  . LUMIGAN 0.01 % SOLN     . omeprazole (PRILOSEC) 40 MG capsule TAKE 1 CAPSULE EVERY DAY 90 capsule 1  . pilocarpine (PILOCAR) 1 % ophthalmic solution Place 1 drop into the left eye 3 (three) times daily.    . rosuvastatin (CRESTOR) 10 MG tablet TAKE 1 TABLET EVERY DAY 90 tablet 1  . tamsulosin (FLOMAX) 0.4 MG CAPS capsule Take 0.4 mg by mouth. After supper    . timolol (TIMOPTIC) 0.5 % ophthalmic solution Place 1 drop into the left eye 2 (two) times daily.     . trospium (SANCTURA) 20 MG tablet TAKE ONE TABLET BY MOUTH EVERY NIGHT AT BEDTIME 30 tablet 0  . venlafaxine XR (EFFEXOR-XR) 37.5 MG 24 hr capsule TAKE 1 CAPSULE EVERY DAY WITH BREAKFAST 90 capsule 1  . XTANDI 40 MG capsule TAKE 3 CAPSULES (120 MG TOTAL) BY MOUTH DAILY. 90 capsule  3   No current facility-administered medications for this visit.   Facility-Administered Medications Ordered in Other Visits  Medication Dose Route Frequency Provider Last Rate Last Admin  . zolendronic acid (ZOMETA) 3 mg in sodium chloride 0.9 % 100 mL IVPB  3 mg Intravenous Once Cammie Sickle, MD 415 mL/hr at 09/25/19 1404 3 mg at 09/25/19 1404    PHYSICAL EXAMINATION: ECOG PERFORMANCE STATUS: 1 - Symptomatic but completely ambulatory  BP 130/67 (BP Location: Left Arm, Patient Position: Sitting, Cuff Size: Normal)   Pulse 60   Temp (!) 96.4 F (35.8 C) (Tympanic)   Wt 134 lb (60.8 kg)   BMI 19.79 kg/m   Filed Weights   09/25/19 1311  Weight: 134 lb (60.8 kg)    Physical Exam  Constitutional: He is oriented to person, place, and time.  Thin built cachectic appearing male patient .  He is alone.  Walking by himself.  Appears pale.  HENT:  Head: Normocephalic and atraumatic.  Mouth/Throat: Oropharynx is clear and moist. No oropharyngeal exudate.  Eyes: Pupils are equal, round, and reactive to light.  Cardiovascular: Normal rate and regular rhythm.  Pulmonary/Chest: No respiratory distress. He has  no wheezes.  Abdominal: Soft. Bowel sounds are normal. He exhibits no distension and no mass. There is no abdominal tenderness. There is no rebound and no guarding.  Musculoskeletal:        General: No tenderness or edema. Normal range of motion.     Cervical back: Normal range of motion and neck supple.  Neurological: He is alert and oriented to person, place, and time.  Skin: Skin is warm.  Psychiatric: Affect normal.   LABORATORY DATA:  I have reviewed the data as listed    Component Value Date/Time   NA 140 09/25/2019 1243   K 5.0 09/25/2019 1243   CL 104 09/25/2019 1243   CO2 25 09/25/2019 1243   GLUCOSE 110 (H) 09/25/2019 1243   BUN 48 (H) 09/25/2019 1243   CREATININE 1.44 (H) 09/25/2019 1243   CALCIUM 9.3 09/25/2019 1243   PROT 7.7 09/25/2019 1243   ALBUMIN 3.8 09/25/2019 1243   AST 15 09/25/2019 1243   ALT 9 09/25/2019 1243   ALKPHOS 78 09/25/2019 1243   BILITOT 0.6 09/25/2019 1243   GFRNONAA 44 (L) 09/25/2019 1243   GFRAA 51 (L) 09/25/2019 1243    No results found for: SPEP, UPEP  Lab Results  Component Value Date   WBC 6.1 09/25/2019   NEUTROABS 4.6 09/25/2019   HGB 10.4 (L) 09/25/2019   HCT 33.1 (L) 09/25/2019   MCV 89.2 09/25/2019   PLT 223 09/25/2019      Chemistry      Component Value Date/Time   NA 140 09/25/2019 1243   K 5.0 09/25/2019 1243   CL 104 09/25/2019 1243   CO2 25 09/25/2019 1243   BUN 48 (H) 09/25/2019 1243   CREATININE 1.44 (H) 09/25/2019 1243      Component Value Date/Time   CALCIUM 9.3 09/25/2019 1243   ALKPHOS 78 09/25/2019 1243   AST 15 09/25/2019 1243   ALT 9 09/25/2019 1243   BILITOT 0.6 09/25/2019 1243       RADIOGRAPHIC STUDIES: I have personally reviewed the radiological images as listed and agreed with the findings in the report. No results found.   ASSESSMENT & PLAN:  Normocytic anemia # Metastatic prostate cancer castrate resistant- on Lupron 22.5 mg ;January 2021 -bone- scan shows new lesions in right ilaic/  ribs/ calvarium-  on xtandi 120 mg/day.   PSA- improving;MARCH 2021- 1.88.   # Clinically stable.  PSA from today pending.  Continue Xtandi.  Given the weight loss I would recommend PET scan for further evaluation.  #Hemoglobin -10.3- STABLE; s/p IV Venofer; HOLD Retacrit today.  #CKD stage III -GFR 48.  Stable.  Bilateral hydro-nephrosis /hydroureter -followed closely by urology.  #Bone lesions-castrate resistant prostate cancer; start Zometa infusions every 3 months.  # Weight loss- 25 pounds in last 2 months. Jolie referral. PEt scan as above.   # DISPOSITION: # referal to [facility]- re: weight loss # NO Retacrit today; Eligard today; Zometa # in 5 weeks- MD; labs- cbc/cmp; PSA; PET scan prior-Dr.B    Orders Placed This Encounter  Procedures  . NM PET (AXUMIN) SKULL BASE TO MID THIGH    Standing Status:   Future    Standing Expiration Date:   11/24/2020    Order Specific Question:   ** REASON FOR EXAM (FREE TEXT)    Answer:   prostate cancer    Order Specific Question:   If indicated for the ordered procedure, I authorize the administration of a radiopharmaceutical per Radiology protocol    Answer:   Yes    Order Specific Question:   Radiology Contrast Protocol - do NOT remove file path    Answer:   [email or web address]  . Comprehensive metabolic panel    Standing Status:   Future    Standing Expiration Date:   09/24/2020  . CBC with Differential    Standing Status:   Future    Standing Expiration Date:   09/24/2020  . PSA    Standing Status:   Future    Standing Expiration Date:   09/24/2020  . Amb Referral to Nutrition and Diabetic E    Referral Priority:   Urgent    Referral Type:   Consultation    Referral Reason:   Specialty Services Required    Number of Visits Requested:   1   All questions were answered. The patient knows to call the clinic with any problems, questions or concerns.      Cammie Sickle, MD 09/25/2019 2:16 PM

## 2019-09-30 DIAGNOSIS — H401133 Primary open-angle glaucoma, bilateral, severe stage: Secondary | ICD-10-CM | POA: Diagnosis not present

## 2019-10-24 ENCOUNTER — Ambulatory Visit
Admission: RE | Admit: 2019-10-24 | Discharge: 2019-10-24 | Disposition: A | Discharge status: Home / Self Care | Payer: Medicare PPO | Source: Ambulatory Visit | Attending: Internal Medicine | Admitting: Internal Medicine

## 2019-10-24 ENCOUNTER — Other Ambulatory Visit: Payer: Self-pay

## 2019-10-24 DIAGNOSIS — C61 Malignant neoplasm of prostate: Secondary | ICD-10-CM

## 2019-10-24 MED ORDER — AXUMIN (FLUCICLOVINE F 18) INJECTION
10.5500 | Freq: Once | INTRAVENOUS | Status: AC | PRN
  Administered 2019-10-24: 10.55 via INTRAVENOUS

## 2019-10-29 ENCOUNTER — Inpatient Hospital Stay: Payer: Medicare PPO | Attending: Internal Medicine

## 2019-10-29 ENCOUNTER — Inpatient Hospital Stay: Payer: Medicare PPO | Admitting: Internal Medicine

## 2019-10-29 DIAGNOSIS — C7951 Secondary malignant neoplasm of bone: Secondary | ICD-10-CM | POA: Insufficient documentation

## 2019-10-29 DIAGNOSIS — E785 Hyperlipidemia, unspecified: Secondary | ICD-10-CM | POA: Insufficient documentation

## 2019-10-29 DIAGNOSIS — Z79818 Long term (current) use of other agents affecting estrogen receptors and estrogen levels: Secondary | ICD-10-CM | POA: Insufficient documentation

## 2019-10-29 DIAGNOSIS — Z79899 Other long term (current) drug therapy: Secondary | ICD-10-CM | POA: Insufficient documentation

## 2019-10-29 DIAGNOSIS — Z7982 Long term (current) use of aspirin: Secondary | ICD-10-CM | POA: Insufficient documentation

## 2019-10-29 DIAGNOSIS — R5383 Other fatigue: Secondary | ICD-10-CM | POA: Insufficient documentation

## 2019-10-29 DIAGNOSIS — Z87891 Personal history of nicotine dependence: Secondary | ICD-10-CM | POA: Insufficient documentation

## 2019-10-29 DIAGNOSIS — R5381 Other malaise: Secondary | ICD-10-CM | POA: Insufficient documentation

## 2019-10-29 DIAGNOSIS — K589 Irritable bowel syndrome without diarrhea: Secondary | ICD-10-CM | POA: Insufficient documentation

## 2019-10-29 DIAGNOSIS — Z681 Body mass index (BMI) 19 or less, adult: Secondary | ICD-10-CM | POA: Insufficient documentation

## 2019-10-29 DIAGNOSIS — N183 Chronic kidney disease, stage 3 unspecified: Secondary | ICD-10-CM | POA: Insufficient documentation

## 2019-10-29 DIAGNOSIS — Z923 Personal history of irradiation: Secondary | ICD-10-CM | POA: Insufficient documentation

## 2019-10-29 DIAGNOSIS — R0602 Shortness of breath: Secondary | ICD-10-CM | POA: Insufficient documentation

## 2019-10-29 DIAGNOSIS — R634 Abnormal weight loss: Secondary | ICD-10-CM | POA: Insufficient documentation

## 2019-10-29 DIAGNOSIS — I129 Hypertensive chronic kidney disease with stage 1 through stage 4 chronic kidney disease, or unspecified chronic kidney disease: Secondary | ICD-10-CM | POA: Insufficient documentation

## 2019-10-29 DIAGNOSIS — C61 Malignant neoplasm of prostate: Secondary | ICD-10-CM | POA: Insufficient documentation

## 2019-11-06 MED FILL — XTANDI 40 MG CAPSULE: 40 | 30 days supply | Qty: 90 | Fill #3

## 2019-11-07 ENCOUNTER — Inpatient Hospital Stay: Payer: Medicare PPO

## 2019-11-07 NOTE — Progress Notes (Signed)
Nutrition  Patient was a no show for nutrition appointment today.   RD sent message to scheduling to offer patient another appointment. Message also sent to provider  Isidra Mings B. Zenia Resides, [place], [place] Registered Dietitian [phone] (pager)

## 2019-11-11 ENCOUNTER — Inpatient Hospital Stay (HOSPITAL_BASED_OUTPATIENT_CLINIC_OR_DEPARTMENT_OTHER): Payer: Medicare PPO | Admitting: Internal Medicine

## 2019-11-11 ENCOUNTER — Other Ambulatory Visit: Payer: Self-pay

## 2019-11-11 ENCOUNTER — Inpatient Hospital Stay: Payer: Medicare PPO

## 2019-11-11 ENCOUNTER — Encounter: Payer: Self-pay | Admitting: Internal Medicine

## 2019-11-11 DIAGNOSIS — R634 Abnormal weight loss: Secondary | ICD-10-CM | POA: Diagnosis not present

## 2019-11-11 DIAGNOSIS — C61 Malignant neoplasm of prostate: Secondary | ICD-10-CM

## 2019-11-11 DIAGNOSIS — Z923 Personal history of irradiation: Secondary | ICD-10-CM | POA: Diagnosis not present

## 2019-11-11 DIAGNOSIS — E785 Hyperlipidemia, unspecified: Secondary | ICD-10-CM | POA: Diagnosis not present

## 2019-11-11 DIAGNOSIS — Z7982 Long term (current) use of aspirin: Secondary | ICD-10-CM | POA: Diagnosis not present

## 2019-11-11 DIAGNOSIS — Z79818 Long term (current) use of other agents affecting estrogen receptors and estrogen levels: Secondary | ICD-10-CM | POA: Diagnosis not present

## 2019-11-11 DIAGNOSIS — R5381 Other malaise: Secondary | ICD-10-CM | POA: Diagnosis not present

## 2019-11-11 DIAGNOSIS — N183 Chronic kidney disease, stage 3 unspecified: Secondary | ICD-10-CM | POA: Diagnosis not present

## 2019-11-11 DIAGNOSIS — Z681 Body mass index (BMI) 19 or less, adult: Secondary | ICD-10-CM | POA: Diagnosis not present

## 2019-11-11 DIAGNOSIS — Z79899 Other long term (current) drug therapy: Secondary | ICD-10-CM | POA: Diagnosis not present

## 2019-11-11 DIAGNOSIS — R5383 Other fatigue: Secondary | ICD-10-CM | POA: Diagnosis not present

## 2019-11-11 DIAGNOSIS — R0602 Shortness of breath: Secondary | ICD-10-CM | POA: Diagnosis not present

## 2019-11-11 DIAGNOSIS — I129 Hypertensive chronic kidney disease with stage 1 through stage 4 chronic kidney disease, or unspecified chronic kidney disease: Secondary | ICD-10-CM | POA: Diagnosis not present

## 2019-11-11 DIAGNOSIS — Z87891 Personal history of nicotine dependence: Secondary | ICD-10-CM | POA: Diagnosis not present

## 2019-11-11 DIAGNOSIS — K589 Irritable bowel syndrome without diarrhea: Secondary | ICD-10-CM | POA: Diagnosis not present

## 2019-11-11 DIAGNOSIS — D649 Anemia, unspecified: Secondary | ICD-10-CM

## 2019-11-11 DIAGNOSIS — C7951 Secondary malignant neoplasm of bone: Secondary | ICD-10-CM | POA: Diagnosis not present

## 2019-11-11 LAB — COMPREHENSIVE METABOLIC PANEL
ALT: 10 U/L (ref 0–44)
AST: 17 U/L (ref 15–41)
Albumin: 3.8 g/dL (ref 3.5–5.0)
Alkaline Phosphatase: 72 U/L (ref 38–126)
Anion gap: 10 (ref 5–15)
BUN: 37 mg/dL — ABNORMAL HIGH (ref 8–23)
CO2: 20 mmol/L — ABNORMAL LOW (ref 22–32)
Calcium: 9 mg/dL (ref 8.9–10.3)
Chloride: 109 mmol/L (ref 98–111)
Creatinine, Ser: 1.48 mg/dL — ABNORMAL HIGH (ref 0.61–1.24)
GFR calc Af Amer: 49 mL/min — ABNORMAL LOW (ref >60)
GFR calc non Af Amer: 43 mL/min — ABNORMAL LOW (ref >60)
Glucose, Bld: 111 mg/dL — ABNORMAL HIGH (ref 70–99)
Potassium: 4.4 mmol/L (ref 3.5–5.1)
Sodium: 139 mmol/L (ref 135–145)
Total Bilirubin: 0.5 mg/dL (ref 0.3–1.2)
Total Protein: 8.3 g/dL — ABNORMAL HIGH (ref 6.5–8.1)

## 2019-11-11 LAB — CBC WITH DIFFERENTIAL/PLATELET
Abs Immature Granulocytes: 0.02 10*3/uL (ref 0.00–0.07)
Basophils Absolute: 0 10*3/uL (ref 0.0–0.1)
Basophils Relative: 0 %
Eosinophils Absolute: 0.1 10*3/uL (ref 0.0–0.5)
Eosinophils Relative: 1 %
HCT: 33.5 % — ABNORMAL LOW (ref 39.0–52.0)
Hemoglobin: 10.6 g/dL — ABNORMAL LOW (ref 13.0–17.0)
Immature Granulocytes: 0 %
Lymphocytes Relative: 13 %
Lymphs Abs: 1 10*3/uL (ref 0.7–4.0)
MCH: 28 pg (ref 26.0–34.0)
MCHC: 31.6 g/dL (ref 30.0–36.0)
MCV: 88.4 fL (ref 80.0–100.0)
Monocytes Absolute: 0.5 10*3/uL (ref 0.1–1.0)
Monocytes Relative: 7 %
Neutro Abs: 5.7 10*3/uL (ref 1.7–7.7)
Neutrophils Relative %: 79 %
Platelets: 266 10*3/uL (ref 150–400)
RBC: 3.79 MIL/uL — ABNORMAL LOW (ref 4.22–5.81)
RDW: 16.3 % — ABNORMAL HIGH (ref 11.5–15.5)
WBC: 7.3 10*3/uL (ref 4.0–10.5)
nRBC: 0 % (ref 0.0–0.2)

## 2019-11-11 LAB — PSA: Prostatic Specific Antigen: 0.1 ng/mL (ref 0.00–4.00)

## 2019-11-11 NOTE — Progress Notes (Signed)
[place] OFFICE PROGRESS NOTE  Patient Care Team: [name], MD as PCP - General (Family Medicine) Bond, Tracie Harrier, MD as Consulting Physician (Ophthalmology) Abbie Sons, MD as Consulting Physician (Urology) Cammie Sickle, MD as Consulting Physician (Hematology and Oncology)  Cancer Staging No matching staging information was found for the patient.   Oncology History Overview Note  # external beam radiation therapy and radiation seed implant in 2005 with Dr. Joelyn Oms and Dr. Danny Lawless for T1c Gleason 3+4 = 7 prostate cancer with a pretreatment PSA of 6.8. In November 2015, he was found to have biochemical recurrence.  Dec 2016- CT a/p & Bone scan- NEG; PSA- 11; June 2017- 19; STARTED on Firmagon [Dr.Budzyn]; on Lupron q [place]  # JAN 2021-PSA rising-3.5.  Bone scan-progression of disease. START X-Tandi 120mg /day.   # OCT 2018- Anemia-? Etiology; Likely anemia of chronic disease [anti-CCP elevated/ but NO RA; rheumatology; ]BMBx- hypercellular 30%; relative myeloid hyperplasia/erythroid hypoplasia; FISH- 5/7/8-Normal. II OPINION at [facility]. [facility] HEM- NEG  # CKD- III; Bladder neck obstruction s/p Foley [Dr.Stoiff]  # JAN 2018- Dexa scan- N  # NGS-P # PALLIATIVE CARE-P ------------------------------------   DIAGNOSIS: PROSTATE CANCER  STAGE:  IV       ;GOALS: control  CURRENT/MOST RECENT THERAPY: Lupron+ X-tandi    Primary adenocarcinoma of prostate ([place])      INTERVAL HISTORY:  Thomas Lopez 86 y.o.  male pleasant patient above history of metastatic prostate cancer to bone on Lupron/Xtandi; anemia iron deficiency/ CKD-is here for follow-up/PET scan.  Patient continues to lose weight.  Appetite is fair.  Denies any pain.  No nausea no vomiting.  Mild fatigue.  Shortness of breath on exertion.  Review of Systems  Constitutional: Positive for malaise/fatigue and weight loss. Negative for chills, diaphoresis and fever.  HENT:  Negative for nosebleeds and sore throat.   Eyes: Negative for double vision.  Respiratory: Positive for shortness of breath. Negative for cough, hemoptysis, sputum production and wheezing.   Cardiovascular: Negative for chest pain, palpitations, orthopnea and leg swelling.  Gastrointestinal: Negative for abdominal pain, blood in stool, constipation, diarrhea, heartburn, melena, nausea and vomiting.  Genitourinary: Negative for dysuria, frequency and urgency.  Musculoskeletal: Positive for joint pain. Negative for back pain.  Skin: Negative.  Negative for itching and rash.  Neurological: Negative for dizziness, tingling, focal weakness, weakness and headaches.  Endo/Heme/Allergies: Does not bruise/bleed easily.  Psychiatric/Behavioral: Negative for depression. The patient is not nervous/anxious and does not have insomnia.       PAST MEDICAL HISTORY :  Past Medical History:  Diagnosis Date  . Aorto-iliac atherosclerosis ([place]) 04/2015   by CT scan  . Carotid stenosis 11/2014   mild bilateral 1-39%, rpt 2 yrs  . Colon polyps    rpt colonoscopy due 2014  . ED (erectile dysfunction)    Trimix and VED  . Esophagitis   . Essential hypertension 06/29/2009  . GERD (gastroesophageal reflux disease)   . Glaucoma    severe ([place])  . HLD (hyperlipidemia)   . HTN (hypertension)   . Hyperglycemia   . IBS (irritable bowel syndrome)   . Prostate cancer [facility]) 2005   s/p seed implant, EBRT (Dr. Alinda Money at [facility]) recurrent treating with androgen deprivation referred to cancer center  . Smoking history     PAST SURGICAL HISTORY :   Past Surgical History:  Procedure Laterality Date  . CATARACT EXTRACTION Left 05/2017   Dr. Edilia Bo  . CATARACT EXTRACTION, BILATERAL  December 2016   [place], Dr. Raynelle Fanning  . COLONOSCOPY  03/01/01   Multiple polyps, divertic//adenomatous polyps  . COLONOSCOPY  04/24/01   multiple polyps  . COLONOSCOPY  10/23/01   Polyps benign-repeat every 2 years   . COLONOSCOPY  06/23/04   Adenom/hyperplastic polyps; multiple external hemorrhoids  . COLONOSCOPY  06/08/07   single small polyp-repeat 5 years  . CYSTOSCOPY WITH INSERTION OF UROLIFT N/A 04/10/2018   Procedure: CYSTOSCOPY WITH INSERTION OF UROLIFT;  Surgeon: Abbie Sons, MD;  Location: [facility] ORS;  Service: Urology;  Laterality: N/A;  . ESOPHAGOGASTRODUODENOSCOPY  03/01/01   H.H.; gastritis esoph. dudodenitis  . NCS/Wrists Left Carpal  2/02   Min./right improved  . PFTs  10/2012   FVC 64%, FEV1 41%, ratio 0.62  . PROSTATE BIOPSY  12/04   positive; radioactive seed implant (Dr. Joelyn Oms)  . Prostate Ext Beam Radiation  1/27-07/23/03   Dr. Danny Lawless  . SPIROMETRY  09/2011   WNL  . TONSILLECTOMY    . Wrist Release  6/98; 9/99   Right    FAMILY HISTORY :   Family History  Problem Relation Age of Onset  . Cancer Mother        colon  . Hyperlipidemia Mother   . Other Mother        Carotid disease  . Coronary artery disease Neg Hx   . Stroke Neg Hx     SOCIAL HISTORY:   Social History   Tobacco Use  . Smoking status: Former Smoker    Quit date: 05/23/1993    Years since quitting: 26.4  . Smokeless tobacco: Never Used  Vaping Use  . Vaping Use: Never used  Substance Use Topics  . Alcohol use: Yes    Comment: Rare  . Drug use: No    ALLERGIES:  has No Known Allergies.  MEDICATIONS:  Current Outpatient Medications  Medication Sig Dispense Refill  . aspirin EC 81 MG tablet Take 81 mg by mouth daily.    . Cholecalciferol (VITAMIN D3) 25 MCG (1000 UT) CAPS Take 1 capsule (1,000 Units total) by mouth daily. 30 capsule   . dorzolamide-timolol (COSOPT) 22.3-6.8 MG/ML ophthalmic solution Place 1 drop into the left eye 2 (two) times daily.    . ferrous sulfate 325 (65 FE) MG tablet Take 1 tablet (325 mg total) by mouth daily with breakfast.  3  . Leuprolide Acetate, 3 Month, (ELIGARD) 22.5 MG injection Inject 22.5 mg into the skin every 3 (three) months. 1 each 3  . lisinopril  (ZESTRIL) 10 MG tablet Take 1 tablet (10 mg total) by mouth daily. 90 tablet 3  . LUMIGAN 0.01 % SOLN     . omeprazole (PRILOSEC) 40 MG capsule TAKE 1 CAPSULE EVERY DAY 90 capsule 1  . pilocarpine (PILOCAR) 1 % ophthalmic solution Place 1 drop into the left eye 3 (three) times daily.    . rosuvastatin (CRESTOR) 10 MG tablet TAKE 1 TABLET EVERY DAY 90 tablet 1  . tamsulosin (FLOMAX) 0.4 MG CAPS capsule Take 0.4 mg by mouth. After supper    . timolol (TIMOPTIC) 0.5 % ophthalmic solution Place 1 drop into the left eye 2 (two) times daily.     . trospium (SANCTURA) 20 MG tablet TAKE ONE TABLET BY MOUTH EVERY NIGHT AT BEDTIME 30 tablet 0  . venlafaxine XR (EFFEXOR-XR) 37.5 MG 24 hr capsule TAKE 1 CAPSULE EVERY DAY WITH BREAKFAST 90 capsule 1  . XTANDI 40 MG capsule TAKE 3  CAPSULES (120 MG TOTAL) BY MOUTH DAILY. 90 capsule 3   No current facility-administered medications for this visit.    PHYSICAL EXAMINATION: ECOG PERFORMANCE STATUS: 1 - Symptomatic but completely ambulatory  BP 137/66   Pulse 68   Temp (!) 97 F (36.1 C) (Oral)   Resp 16   Ht 5\' 9"  (1.753 m)   Wt 135 lb (61.2 kg)   SpO2 100%   BMI 19.94 kg/m   Filed Weights   11/11/19 1030  Weight: 135 lb (61.2 kg)    Physical Exam Constitutional:      Comments: Thin built cachectic appearing male patient .  He is alone.  Walking by himself.  Appears pale.  HENT:     Head: Normocephalic and atraumatic.     Mouth/Throat:     Pharynx: No oropharyngeal exudate.  Eyes:     Pupils: Pupils are equal, round, and reactive to light.  Cardiovascular:     Rate and Rhythm: Normal rate and regular rhythm.  Pulmonary:     Effort: No respiratory distress.     Breath sounds: No wheezing.  Abdominal:     General: Bowel sounds are normal. There is no distension.     Palpations: Abdomen is soft. There is no mass.     Tenderness: There is no abdominal tenderness. There is no guarding or rebound.  Musculoskeletal:        General: No  tenderness. Normal range of motion.     Cervical back: Normal range of motion and neck supple.  Skin:    General: Skin is warm.  Neurological:     Mental Status: He is alert and oriented to person, place, and time.  Psychiatric:        Mood and Affect: Affect normal.    LABORATORY DATA:  I have reviewed the data as listed    Component Value Date/Time   NA 139 11/11/2019 1011   K 4.4 11/11/2019 1011   CL 109 11/11/2019 1011   CO2 20 (L) 11/11/2019 1011   GLUCOSE 111 (H) 11/11/2019 1011   BUN 37 (H) 11/11/2019 1011   CREATININE 1.48 (H) 11/11/2019 1011   CALCIUM 9.0 11/11/2019 1011   PROT 8.3 (H) 11/11/2019 1011   ALBUMIN 3.8 11/11/2019 1011   AST 17 11/11/2019 1011   ALT 10 11/11/2019 1011   ALKPHOS 72 11/11/2019 1011   BILITOT 0.5 11/11/2019 1011   GFRNONAA 43 (L) 11/11/2019 1011   GFRAA 49 (L) 11/11/2019 1011    No results found for: SPEP, UPEP  Lab Results  Component Value Date   WBC 7.3 11/11/2019   NEUTROABS 5.7 11/11/2019   HGB 10.6 (L) 11/11/2019   HCT 33.5 (L) 11/11/2019   MCV 88.4 11/11/2019   PLT 266 11/11/2019      Chemistry      Component Value Date/Time   NA 139 11/11/2019 1011   K 4.4 11/11/2019 1011   CL 109 11/11/2019 1011   CO2 20 (L) 11/11/2019 1011   BUN 37 (H) 11/11/2019 1011   CREATININE 1.48 (H) 11/11/2019 1011      Component Value Date/Time   CALCIUM 9.0 11/11/2019 1011   ALKPHOS 72 11/11/2019 1011   AST 17 11/11/2019 1011   ALT 10 11/11/2019 1011   BILITOT 0.5 11/11/2019 1011       RADIOGRAPHIC STUDIES: I have personally reviewed the radiological images as listed and agreed with the findings in the report. No results found.   ASSESSMENT & PLAN:  Primary  adenocarcinoma of prostate [facility] # Metastatic prostate cancer castrate resistant- on Lupron 22.5 mg ;January 2021 -bone- scan shows new lesions in right ilaic/ ribs/ calvarium- on xtandi 120 mg/day. June 3rd PET- improved bone lesions;  prostate uptake noted.  Stable.  #  Clinically stable; however losing wieght- ? Sec to X-tandi; HOLD x-tandi for 1 month; plan to start 2 pills.  #Hemoglobin -10.3-STABLE; s/p IV Venofer; HOLD Retacrit today.  #CKD stage III -GFR 48.STABLE;   # Bone lesions-castrate resistant prostate cancer; start Zometa infusions every 3 months.  # Weight loss-? X-tandi.  25 pounds in last 2 months. Jolie referral- HOLD x-tandi for 1 month.  We will plan to start at 2 pills.  # DISPOSITION: # NO Retacrit today;  # in  1 month - MD; labs- cbc/cmp; PSA; possible retacrit-Dr.B     No orders of the defined types were placed in this encounter.  All questions were answered. The patient knows to call the clinic with any problems, questions or concerns.      Cammie Sickle, MD 11/11/2019 3:50 PM

## 2019-11-11 NOTE — Assessment & Plan Note (Addendum)
#   Metastatic prostate cancer castrate resistant- on Lupron 22.5 mg ;January 2021 -bone- scan shows new lesions in right ilaic/ ribs/ calvarium- on xtandi 120 mg/day. June 3rd PET- improved bone lesions;  prostate uptake noted.  Stable.  # Clinically stable; however losing wieght- ? Sec to X-tandi; HOLD x-tandi for 1 month; plan to start 2 pills.  #Hemoglobin -10.3-STABLE; s/p IV Venofer; HOLD Retacrit today.  #CKD stage III -GFR 48.STABLE;   # Bone lesions-castrate resistant prostate cancer; start Zometa infusions every 3 months.  # Weight loss-? X-tandi.  25 pounds in last 2 months. Jolie referral- HOLD x-tandi for 1 month.  We will plan to start at 2 pills.  # DISPOSITION: # NO Retacrit today;  # in  1 month - MD; labs- cbc/cmp; PSA; possible retacrit-Dr.B

## 2019-12-04 ENCOUNTER — Other Ambulatory Visit: Payer: Self-pay

## 2019-12-04 ENCOUNTER — Other Ambulatory Visit: Payer: Self-pay | Admitting: *Deleted

## 2019-12-04 ENCOUNTER — Inpatient Hospital Stay: Payer: Medicare PPO | Attending: Internal Medicine

## 2019-12-04 DIAGNOSIS — R634 Abnormal weight loss: Secondary | ICD-10-CM | POA: Insufficient documentation

## 2019-12-04 DIAGNOSIS — Z87891 Personal history of nicotine dependence: Secondary | ICD-10-CM | POA: Diagnosis not present

## 2019-12-04 DIAGNOSIS — Z7982 Long term (current) use of aspirin: Secondary | ICD-10-CM | POA: Insufficient documentation

## 2019-12-04 DIAGNOSIS — R5381 Other malaise: Secondary | ICD-10-CM | POA: Insufficient documentation

## 2019-12-04 DIAGNOSIS — Z79899 Other long term (current) drug therapy: Secondary | ICD-10-CM | POA: Insufficient documentation

## 2019-12-04 DIAGNOSIS — N183 Chronic kidney disease, stage 3 unspecified: Secondary | ICD-10-CM | POA: Insufficient documentation

## 2019-12-04 DIAGNOSIS — E785 Hyperlipidemia, unspecified: Secondary | ICD-10-CM | POA: Diagnosis not present

## 2019-12-04 DIAGNOSIS — I129 Hypertensive chronic kidney disease with stage 1 through stage 4 chronic kidney disease, or unspecified chronic kidney disease: Secondary | ICD-10-CM | POA: Insufficient documentation

## 2019-12-04 DIAGNOSIS — R197 Diarrhea, unspecified: Secondary | ICD-10-CM | POA: Insufficient documentation

## 2019-12-04 DIAGNOSIS — R5383 Other fatigue: Secondary | ICD-10-CM | POA: Insufficient documentation

## 2019-12-04 DIAGNOSIS — Z79818 Long term (current) use of other agents affecting estrogen receptors and estrogen levels: Secondary | ICD-10-CM | POA: Diagnosis not present

## 2019-12-04 DIAGNOSIS — C61 Malignant neoplasm of prostate: Secondary | ICD-10-CM | POA: Diagnosis not present

## 2019-12-04 DIAGNOSIS — Z923 Personal history of irradiation: Secondary | ICD-10-CM | POA: Diagnosis not present

## 2019-12-04 LAB — CBC WITH DIFFERENTIAL/PLATELET
Abs Immature Granulocytes: 0.02 10*3/uL (ref 0.00–0.07)
Basophils Absolute: 0 10*3/uL (ref 0.0–0.1)
Basophils Relative: 0 %
Eosinophils Absolute: 0 10*3/uL (ref 0.0–0.5)
Eosinophils Relative: 0 %
HCT: 36 % — ABNORMAL LOW (ref 39.0–52.0)
Hemoglobin: 11.2 g/dL — ABNORMAL LOW (ref 13.0–17.0)
Immature Granulocytes: 0 %
Lymphocytes Relative: 12 %
Lymphs Abs: 0.9 10*3/uL (ref 0.7–4.0)
MCH: 28.1 pg (ref 26.0–34.0)
MCHC: 31.1 g/dL (ref 30.0–36.0)
MCV: 90.2 fL (ref 80.0–100.0)
Monocytes Absolute: 0.5 10*3/uL (ref 0.1–1.0)
Monocytes Relative: 7 %
Neutro Abs: 5.6 10*3/uL (ref 1.7–7.7)
Neutrophils Relative %: 81 %
Platelets: 281 10*3/uL (ref 150–400)
RBC: 3.99 MIL/uL — ABNORMAL LOW (ref 4.22–5.81)
RDW: 15.4 % (ref 11.5–15.5)
WBC: 7 10*3/uL (ref 4.0–10.5)
nRBC: 0.3 % — ABNORMAL HIGH (ref 0.0–0.2)

## 2019-12-04 LAB — COMPREHENSIVE METABOLIC PANEL
ALT: 10 U/L (ref 0–44)
AST: 16 U/L (ref 15–41)
Albumin: 4 g/dL (ref 3.5–5.0)
Alkaline Phosphatase: 63 U/L (ref 38–126)
Anion gap: 10 (ref 5–15)
BUN: 43 mg/dL — ABNORMAL HIGH (ref 8–23)
CO2: 22 mmol/L (ref 22–32)
Calcium: 9.1 mg/dL (ref 8.9–10.3)
Chloride: 107 mmol/L (ref 98–111)
Creatinine, Ser: 1.47 mg/dL — ABNORMAL HIGH (ref 0.61–1.24)
GFR calc Af Amer: 50 mL/min — ABNORMAL LOW (ref >60)
GFR calc non Af Amer: 43 mL/min — ABNORMAL LOW (ref >60)
Glucose, Bld: 125 mg/dL — ABNORMAL HIGH (ref 70–99)
Potassium: 4.3 mmol/L (ref 3.5–5.1)
Sodium: 139 mmol/L (ref 135–145)
Total Bilirubin: 0.4 mg/dL (ref 0.3–1.2)
Total Protein: 8.5 g/dL — ABNORMAL HIGH (ref 6.5–8.1)

## 2019-12-04 LAB — PSA: Prostatic Specific Antigen: 0.06 ng/mL (ref 0.00–4.00)

## 2019-12-06 ENCOUNTER — Other Ambulatory Visit: Payer: Medicare PPO

## 2019-12-09 ENCOUNTER — Other Ambulatory Visit: Payer: Medicare PPO

## 2019-12-09 ENCOUNTER — Inpatient Hospital Stay: Payer: Medicare PPO

## 2019-12-09 ENCOUNTER — Inpatient Hospital Stay: Payer: Medicare PPO | Admitting: Internal Medicine

## 2019-12-09 NOTE — Progress Notes (Signed)
Nutrition Assessment   Reason for Assessment:  Weight loss   ASSESSMENT:  86 year old male with metastatic prostate cancer to bone.  Patient on xtandi, decreased dose.  Past medical history of CKD stage II, CAD, PAD, IBS.  Spoke with patient via phone for nutrition assessment.  Patient reports that he has a good appetite.  Denies nausea.  Reports loose/watery stool about 1 time per day associated with chemo pill.  Breakfast is usually cereal or bacon and egg and usually always has fruit (banana, cantelope, peaches).  Lunch is soup or sandwich around 12-1pm.  Supper is around 5-6pm and usually has meat (chicken), rice, greens, green beans, corn on the cob.  Rarely snacks before bed.  Reports did drink boost shake but has not drank any recently (240 calorie shake).   Medications: Vit D3, Fe sulfate, prilosec   Labs: BUN 43, creatinine 1.47   Anthropometrics:   Height: 69 inches Weight: 135 lb 6/21 150s March 2021 BMI: 19  10% weight loss in the last 3 months, significant  Estimated Energy Needs  Kcals: 1800-2100 Protein: 90-105 g Fluid: > 1.8 L   NUTRITION DIAGNOSIS: Unintentional weight loss related to cancer and cancer related treatment side effects as evidenced by 10% weight loss in the last 3 months   INTERVENTION:  Encouraged patient to add back boost shake (360 calorie shake if possible) for added calories and protein.  Patient said this would be no problem. Add bedtime snack, with reported "good" appetite.  Discussed options to add. Discussed additional ways to add calories and protein.  Will mail handout to patient Contact information provided to patient and will mail card.    MONITORING, EVALUATION, GOAL: weight trends, intake   Next Visit: Monday, August 16 face to face visit  Mirra Basilio B. Zenia Resides, [place], [place] Registered Dietitian [phone] (pager)

## 2019-12-11 ENCOUNTER — Other Ambulatory Visit: Payer: Self-pay | Admitting: *Deleted

## 2019-12-11 DIAGNOSIS — C61 Malignant neoplasm of prostate: Secondary | ICD-10-CM

## 2019-12-11 DIAGNOSIS — D649 Anemia, unspecified: Secondary | ICD-10-CM

## 2019-12-12 ENCOUNTER — Encounter: Payer: Self-pay | Admitting: Internal Medicine

## 2019-12-12 ENCOUNTER — Other Ambulatory Visit: Payer: Self-pay

## 2019-12-12 ENCOUNTER — Inpatient Hospital Stay (HOSPITAL_BASED_OUTPATIENT_CLINIC_OR_DEPARTMENT_OTHER): Payer: Medicare PPO | Admitting: Internal Medicine

## 2019-12-12 ENCOUNTER — Inpatient Hospital Stay: Payer: Medicare PPO

## 2019-12-12 DIAGNOSIS — D649 Anemia, unspecified: Secondary | ICD-10-CM

## 2019-12-12 DIAGNOSIS — N183 Chronic kidney disease, stage 3 unspecified: Secondary | ICD-10-CM | POA: Diagnosis not present

## 2019-12-12 DIAGNOSIS — R197 Diarrhea, unspecified: Secondary | ICD-10-CM | POA: Diagnosis not present

## 2019-12-12 DIAGNOSIS — Z923 Personal history of irradiation: Secondary | ICD-10-CM | POA: Diagnosis not present

## 2019-12-12 DIAGNOSIS — C61 Malignant neoplasm of prostate: Secondary | ICD-10-CM

## 2019-12-12 DIAGNOSIS — R5381 Other malaise: Secondary | ICD-10-CM | POA: Diagnosis not present

## 2019-12-12 DIAGNOSIS — I129 Hypertensive chronic kidney disease with stage 1 through stage 4 chronic kidney disease, or unspecified chronic kidney disease: Secondary | ICD-10-CM | POA: Diagnosis not present

## 2019-12-12 DIAGNOSIS — R5383 Other fatigue: Secondary | ICD-10-CM | POA: Diagnosis not present

## 2019-12-12 DIAGNOSIS — R634 Abnormal weight loss: Secondary | ICD-10-CM | POA: Diagnosis not present

## 2019-12-12 DIAGNOSIS — Z79818 Long term (current) use of other agents affecting estrogen receptors and estrogen levels: Secondary | ICD-10-CM | POA: Diagnosis not present

## 2019-12-12 LAB — COMPREHENSIVE METABOLIC PANEL
ALT: 9 U/L (ref 0–44)
AST: 15 U/L (ref 15–41)
Albumin: 3.9 g/dL (ref 3.5–5.0)
Alkaline Phosphatase: 55 U/L (ref 38–126)
Anion gap: 11 (ref 5–15)
BUN: 41 mg/dL — ABNORMAL HIGH (ref 8–23)
CO2: 24 mmol/L (ref 22–32)
Calcium: 9.6 mg/dL (ref 8.9–10.3)
Chloride: 103 mmol/L (ref 98–111)
Creatinine, Ser: 1.42 mg/dL — ABNORMAL HIGH (ref 0.61–1.24)
GFR calc Af Amer: 52 mL/min — ABNORMAL LOW (ref >60)
GFR calc non Af Amer: 45 mL/min — ABNORMAL LOW (ref >60)
Glucose, Bld: 110 mg/dL — ABNORMAL HIGH (ref 70–99)
Potassium: 4.1 mmol/L (ref 3.5–5.1)
Sodium: 138 mmol/L (ref 135–145)
Total Bilirubin: 0.5 mg/dL (ref 0.3–1.2)
Total Protein: 8.6 g/dL — ABNORMAL HIGH (ref 6.5–8.1)

## 2019-12-12 LAB — CBC WITH DIFFERENTIAL/PLATELET
Abs Immature Granulocytes: 0.05 10*3/uL (ref 0.00–0.07)
Basophils Absolute: 0 10*3/uL (ref 0.0–0.1)
Basophils Relative: 0 %
Eosinophils Absolute: 0.1 10*3/uL (ref 0.0–0.5)
Eosinophils Relative: 1 %
HCT: 33.9 % — ABNORMAL LOW (ref 39.0–52.0)
Hemoglobin: 10.7 g/dL — ABNORMAL LOW (ref 13.0–17.0)
Immature Granulocytes: 1 %
Lymphocytes Relative: 13 %
Lymphs Abs: 1 10*3/uL (ref 0.7–4.0)
MCH: 28.1 pg (ref 26.0–34.0)
MCHC: 31.6 g/dL (ref 30.0–36.0)
MCV: 89 fL (ref 80.0–100.0)
Monocytes Absolute: 0.6 10*3/uL (ref 0.1–1.0)
Monocytes Relative: 7 %
Neutro Abs: 6.3 10*3/uL (ref 1.7–7.7)
Neutrophils Relative %: 78 %
Platelets: 309 10*3/uL (ref 150–400)
RBC: 3.81 MIL/uL — ABNORMAL LOW (ref 4.22–5.81)
RDW: 14.8 % (ref 11.5–15.5)
WBC: 8 10*3/uL (ref 4.0–10.5)
nRBC: 0 % (ref 0.0–0.2)

## 2019-12-12 LAB — PSA: Prostatic Specific Antigen: 0.08 ng/mL (ref 0.00–4.00)

## 2019-12-12 NOTE — Progress Notes (Signed)
[facility] OFFICE PROGRESS NOTE  Patient Care Team: [name], MD as PCP - General (Family Medicine) Bond, Tracie Harrier, MD as Consulting Physician (Ophthalmology) Abbie Sons, MD as Consulting Physician (Urology) Cammie Sickle, MD as Consulting Physician (Hematology and Oncology)  Cancer Staging No matching staging information was found for the patient.   Oncology History Overview Note  # external beam radiation therapy and radiation seed implant in 2005 with Dr. Joelyn Oms and Dr. Danny Lawless for T1c Gleason 3+4 = 7 prostate cancer with a pretreatment PSA of 6.8. In November 2015, he was found to have biochemical recurrence.  Dec 2016- CT a/p & Bone scan- NEG; PSA- 11; June 2017- 19; STARTED on Firmagon [Dr.Budzyn]; on Lupron q [place]  # JAN 2021-PSA rising-3.5.  Bone scan-progression of disease. START X-Tandi 143m/day.   # OCT 2018- Anemia-? Etiology; Likely anemia of chronic disease [anti-CCP elevated/ but NO RA; rheumatology; ]BMBx- hypercellular 30%; relative myeloid hyperplasia/erythroid hypoplasia; FISH- 5/7/8-Normal. [facility]G  # CKD- III; Bladder neck obstruction s/p Foley [Dr.Stoiff]  # JAN 2018- Dexa scan- N  # NGS-P # PALLIATIVE CARE-P ------------------------------------   DIAGNOSIS: PROSTATE CANCER  STAGE:  IV       ;GOALS: control  CURRENT/MOST RECENT THERAPY: Lupron+ X-tandi    Primary adenocarcinoma of prostate ([place]      INTERVAL HISTORY:  Thomas [id number].o.  male pleasant patient above history of metastatic prostate cancer to bone on Lupron/Xtandi; anemia iron deficiency/ CKD-is here for follow-up.  Unfortunately continues to take XRosalee Kaufmanwas recommended to be stopped at last visit 1 month ago given his ongoing weight loss.    Patient has met with nutrition in the interim.  His weight is stable.  No nausea no vomiting.  Intermittent diarrhea.   Review of Systems  Constitutional:  Positive for malaise/fatigue and weight loss. Negative for chills, diaphoresis and fever.  HENT: Negative for nosebleeds and sore throat.   Eyes: Negative for double vision.  Respiratory: Positive for shortness of breath. Negative for cough, hemoptysis, sputum production and wheezing.   Cardiovascular: Negative for chest pain, palpitations, orthopnea and leg swelling.  Gastrointestinal: Negative for abdominal pain, blood in stool, constipation, diarrhea, heartburn, melena, nausea and vomiting.  Genitourinary: Negative for dysuria, frequency and urgency.  Musculoskeletal: Positive for joint pain. Negative for back pain.  Skin: Negative.  Negative for itching and rash.  Neurological: Negative for dizziness, tingling, focal weakness, weakness and headaches.  Endo/Heme/Allergies: Does not bruise/bleed easily.  Psychiatric/Behavioral: Negative for depression. The patient is not nervous/anxious and does not have insomnia.       PAST MEDICAL HISTORY :  Past Medical History:  Diagnosis Date  . Aorto-iliac atherosclerosis ([place] 04/2015   by CT scan  . Carotid stenosis 11/2014   mild bilateral 1-39%, rpt 2 yrs  . Colon polyps    rpt colonoscopy due 2014  . ED (erectile dysfunction)    Trimix and VED  . Esophagitis   . Essential hypertension 06/29/2009  . GERD (gastroesophageal reflux disease)   . Glaucoma    severe ([place])  . HLD (hyperlipidemia)   . HTN (hypertension)   . Hyperglycemia   . IBS (irritable bowel syndrome)   . Prostate cancer ([facility] 2005   s[facility]p seed implant[facility] with androgen deprivation referred to cancer center  . Smoking history     PAST SURGICAL HISTORY :   Past Surgical History:  Procedure  Laterality Date  . CATARACT EXTRACTION Left 05/2017   Dr. Edilia Bo  . CATARACT EXTRACTION, BILATERAL  December 2016   [place], Dr. Raynelle Fanning  . COLONOSCOPY  03/01/01   Multiple polyps, divertic//adenomatous polyps  .  COLONOSCOPY  04/24/01   multiple polyps  . COLONOSCOPY  10/23/01   Polyps benign-repeat every 2 years  . COLONOSCOPY  06/23/04   Adenom/hyperplastic polyps; multiple external hemorrhoids  . COLONOSCOPY  06/08/07   single small polyp-repeat 5 years  . CYSTOSCOPY WITH INSERTION OF UROLIFT N/A 04/10/2018   Procedure: CYSTOSCOPY WITH INSERTION OF UROLIFT;  Surgeon: Abbie Sons, MD;  Location: [facility] ORS;  Service: Urology;  Laterality: N/A;  . ESOPHAGOGASTRODUODENOSCOPY  03/01/01   H.H.; gastritis esoph. dudodenitis  . NCS/Wrists Left Carpal  2/02   Min./right improved  . PFTs  10/2012   FVC 64%, FEV1 41%, ratio 0.62  . PROSTATE BIOPSY  12/04   positive; radioactive seed implant (Dr. Joelyn Oms)  . Prostate Ext Beam Radiation  1/27-07/23/03   Dr. Danny Lawless  . SPIROMETRY  09/2011   WNL  . TONSILLECTOMY    . Wrist Release  6/98; 9/99   Right    FAMILY HISTORY :   Family History  Problem Relation Age of Onset  . Cancer Mother        colon  . Hyperlipidemia Mother   . Other Mother        Carotid disease  . Coronary artery disease Neg Hx   . Stroke Neg Hx     SOCIAL HISTORY:   Social History   Tobacco Use  . Smoking status: Former Smoker    Quit date: 05/23/1993    Years since quitting: 26.5  . Smokeless tobacco: Never Used  Vaping Use  . Vaping Use: Never used  Substance Use Topics  . Alcohol use: Yes    Comment: Rare  . Drug use: No    ALLERGIES:  has No Known Allergies.  MEDICATIONS:  Current Outpatient Medications  Medication Sig Dispense Refill  . aspirin EC 81 MG tablet Take 81 mg by mouth daily.    . Cholecalciferol (VITAMIN D3) 25 MCG (1000 UT) CAPS Take 1 capsule (1,000 Units total) by mouth daily. 30 capsule   . dorzolamide-timolol (COSOPT) 22.3-6.8 MG/ML ophthalmic solution Place 1 drop into the left eye 2 (two) times daily.    . ferrous sulfate 325 (65 FE) MG tablet Take 1 tablet (325 mg total) by mouth daily with breakfast.  3  . Leuprolide Acetate, 3 Month,  (ELIGARD) 22.5 MG injection Inject 22.5 mg into the skin every 3 (three) months. 1 each 3  . lisinopril (ZESTRIL) 10 MG tablet Take 1 tablet (10 mg total) by mouth daily. 90 tablet 3  . LUMIGAN 0.01 % SOLN     . omeprazole (PRILOSEC) 40 MG capsule TAKE 1 CAPSULE EVERY DAY 90 capsule 1  . pilocarpine (PILOCAR) 1 % ophthalmic solution Place 1 drop into the left eye 3 (three) times daily.    . rosuvastatin (CRESTOR) 10 MG tablet TAKE 1 TABLET EVERY DAY 90 tablet 1  . tamsulosin (FLOMAX) 0.4 MG CAPS capsule Take 0.4 mg by mouth. After supper    . timolol (TIMOPTIC) 0.5 % ophthalmic solution Place 1 drop into the left eye 2 (two) times daily.     . trospium (SANCTURA) 20 MG tablet TAKE ONE TABLET BY MOUTH EVERY NIGHT AT BEDTIME 30 tablet 0  . venlafaxine XR (EFFEXOR-XR) 37.5 MG 24 hr capsule  TAKE 1 CAPSULE EVERY DAY WITH BREAKFAST 90 capsule 1  . XTANDI 40 MG capsule TAKE 3 CAPSULES (120 MG TOTAL) BY MOUTH DAILY. 90 capsule 3   No current facility-administered medications for this visit.    PHYSICAL EXAMINATION: ECOG PERFORMANCE STATUS: 1 - Symptomatic but completely ambulatory  BP (!) 144/56 (BP Location: Left Arm, Patient Position: Sitting, Cuff Size: Normal)   Pulse (!) 51   Temp (!) 95.1 F (35.1 C) (Tympanic)   Wt 133 lb 4 oz (60.4 kg)   BMI 19.68 kg/m   Filed Weights   12/12/19 1001  Weight: 133 lb 4 oz (60.4 kg)    Physical Exam Constitutional:      Comments: Thin built cachectic appearing male patient .  He is alone.  Walking by himself.  Appears pale.  HENT:     Head: Normocephalic and atraumatic.     Mouth/Throat:     Pharynx: No oropharyngeal exudate.  Eyes:     Pupils: Pupils are equal, round, and reactive to light.  Cardiovascular:     Rate and Rhythm: Normal rate and regular rhythm.  Pulmonary:     Effort: No respiratory distress.     Breath sounds: No wheezing.  Abdominal:     General: Bowel sounds are normal. There is no distension.     Palpations: Abdomen is  soft. There is no mass.     Tenderness: There is no abdominal tenderness. There is no guarding or rebound.  Musculoskeletal:        General: No tenderness. Normal range of motion.     Cervical back: Normal range of motion and neck supple.  Skin:    General: Skin is warm.  Neurological:     Mental Status: He is alert and oriented to person, place, and time.  Psychiatric:        Mood and Affect: Affect normal.    LABORATORY DATA:  I have reviewed the data as listed    Component Value Date/Time   NA 138 12/12/2019 0948   K 4.1 12/12/2019 0948   CL 103 12/12/2019 0948   CO2 24 12/12/2019 0948   GLUCOSE 110 (H) 12/12/2019 0948   BUN 41 (H) 12/12/2019 0948   CREATININE 1.42 (H) 12/12/2019 0948   CALCIUM 9.6 12/12/2019 0948   PROT 8.6 (H) 12/12/2019 0948   ALBUMIN 3.9 12/12/2019 0948   AST 15 12/12/2019 0948   ALT 9 12/12/2019 0948   ALKPHOS 55 12/12/2019 0948   BILITOT 0.5 12/12/2019 0948   GFRNONAA 45 (L) 12/12/2019 0948   GFRAA 52 (L) 12/12/2019 0948    No results found for: SPEP, UPEP  Lab Results  Component Value Date   WBC 8.0 12/12/2019   NEUTROABS 6.3 12/12/2019   HGB 10.7 (L) 12/12/2019   HCT 33.9 (L) 12/12/2019   MCV 89.0 12/12/2019   PLT 309 12/12/2019      Chemistry      Component Value Date/Time   NA 138 12/12/2019 0948   K 4.1 12/12/2019 0948   CL 103 12/12/2019 0948   CO2 24 12/12/2019 0948   BUN 41 (H) 12/12/2019 0948   CREATININE 1.42 (H) 12/12/2019 0948      Component Value Date/Time   CALCIUM 9.6 12/12/2019 0948   ALKPHOS 55 12/12/2019 0948   AST 15 12/12/2019 0948   ALT 9 12/12/2019 0948   BILITOT 0.5 12/12/2019 0948       RADIOGRAPHIC STUDIES: I have personally reviewed the radiological images as listed and  agreed with the findings in the report. No results found.   ASSESSMENT & PLAN:  Primary adenocarcinoma of prostate ([place]) # Metastatic prostate cancer castrate resistant- on Lupron 22.5 mg;-bone- scan shows new lesions in right  ilaic/ ribs/ calvarium- on xtandi 120 mg/day. June 3rd PET- improved bone lesions;  prostate uptake noted. July 2021- 0.06; STABLE.  # Clinically STABLE;  however losing wieght- ? Sec to X-tandi; HOLD x-tandi for 1 month; plan to start 2 pills.  #Hemoglobin -10.3-STABLE; s/p IV Venofer; HOLD Retacrit today.  #CKD stage III -GFR 48.STABLE;   # Bone lesions-castrate resistant prostate cancer; start Zometa infusions every 3 months. [Discuss]  # Weight loss-? X-tandi.  25 pounds in last 2 months. s/p Jolie referral- HOLD x-tandi until next visit.  # DISPOSITION: # NO Retacrit today;  # in 5 weeks - MD; Sharyn Dross possible retacrit; 2-3 days prior labs- cbc/cmp; PSA;-Dr.B    Orders Placed This Encounter  Procedures  . CBC with Differential/Platelet    Standing Status:   Future    Standing Expiration Date:   12/11/2020  . Comprehensive metabolic panel    Standing Status:   Future    Standing Expiration Date:   12/11/2020  . PSA    Standing Status:   Future    Standing Expiration Date:   12/11/2020   All questions were answered. The patient knows to call the clinic with any problems, questions or concerns.      Cammie Sickle, MD 12/12/2019 12:02 PM

## 2019-12-12 NOTE — Patient Instructions (Signed)
#   STOP X-TANDI starting today; until further directions

## 2019-12-12 NOTE — Assessment & Plan Note (Addendum)
#   Metastatic prostate cancer castrate resistant- on Lupron 22.5 mg;-bone- scan shows new lesions in right ilaic/ ribs/ calvarium- on xtandi 120 mg/day. June 3rd PET- improved bone lesions;  prostate uptake noted. July 2021- 0.06; STABLE.  # Clinically STABLE;  however losing wieght- ? Sec to X-tandi; HOLD x-tandi for 1 month; plan to start 2 pills.  #Hemoglobin -10.3-STABLE; s/p IV Venofer; HOLD Retacrit today.  #CKD stage III -GFR 48.STABLE;   # Bone lesions-castrate resistant prostate cancer; start Zometa infusions every 3 months. [Discuss]  # Weight loss-? X-tandi.  25 pounds in last 2 months. s/p Jolie referral- HOLD x-tandi until next visit.  # DISPOSITION: # NO Retacrit today;  # in 5 weeks - MD; Sharyn Dross possible retacrit; 2-3 days prior labs- cbc/cmp; PSA;-Dr.B

## 2019-12-13 DIAGNOSIS — C61 Malignant neoplasm of prostate: Secondary | ICD-10-CM | POA: Diagnosis not present

## 2019-12-13 DIAGNOSIS — H401133 Primary open-angle glaucoma, bilateral, severe stage: Secondary | ICD-10-CM | POA: Diagnosis not present

## 2019-12-19 ENCOUNTER — Encounter: Payer: Self-pay | Admitting: Urology

## 2019-12-19 ENCOUNTER — Ambulatory Visit (INDEPENDENT_AMBULATORY_CARE_PROVIDER_SITE_OTHER): Payer: Medicare PPO | Admitting: Urology

## 2019-12-19 ENCOUNTER — Other Ambulatory Visit: Payer: Self-pay

## 2019-12-19 VITALS — BP 99/64 | HR 86 | Ht 70.0 in | Wt 134.0 lb

## 2019-12-19 DIAGNOSIS — N401 Enlarged prostate with lower urinary tract symptoms: Secondary | ICD-10-CM | POA: Diagnosis not present

## 2019-12-19 LAB — BLADDER SCAN AMB NON-IMAGING: Scan Result: 126

## 2019-12-19 NOTE — Progress Notes (Deleted)
[place]

## 2019-12-22 ENCOUNTER — Encounter: Payer: Self-pay | Admitting: Urology

## 2019-12-22 NOTE — Progress Notes (Signed)
12/19/2019 11:50 AM   Thomas Lopez July 1932-06-17 [id number]  Referring provider: [name], MD [address] [place],  [place] [address]  Chief Complaint  Patient presents with  . Benign Prostatic Hypertrophy    HPI: 86 y.o. male presents for annual follow-up.  He had a history of incomplete bladder emptying and bilateral hydronephrosis.  His hydronephrosis did not change after a trial of Foley catheter drainage.  Lasix renal scan showed no evidence of obstruction.  He was symptomatic with complaints of urinary hesitancy, decreased force and caliber of his urinary stream.  VUDS was consistent with obstruction.  After discussing management options he elected UroLift which was performed on 04/10/2018.  At his initial postoperative follow-up PVR was 20 mL and he was voiding with a good stream and felt he was emptying his bladder to completion.  Developed significant nocturia last year.  Trial of trospium 20 mg at bedtime without improvement.  Continues to void with a good stream and most bothersome voiding symptom is nocturia  Denies dysuria or gross hematuria.  No flank, abdominal or pelvic pain  Followed by medical oncology for metastatic castrate resistant prostate cancer; on leuprolide/Xtandi.  Recent bone scan showed new lesions right iliac/ribs/skull.  Xtandi held 1 month for weight loss   PMH: Past Medical History:  Diagnosis Date  . Aorto-iliac atherosclerosis ([place]) 04/2015   by CT scan  . Carotid stenosis 11/2014   mild bilateral 1-39%, rpt 2 yrs  . Colon polyps    rpt colonoscopy due 2014  . ED (erectile dysfunction)    Trimix and VED  . Esophagitis   . Essential hypertension 06/29/2009  . GERD (gastroesophageal reflux disease)   . Glaucoma    severe ([place])  . HLD (hyperlipidemia)   . HTN (hypertension)   . Hyperglycemia   . IBS (irritable bowel syndrome)   . Prostate cancer [facility]) 2005   s/p seed implant, EBRT ([facility]. Alinda [facility] at [facility]) recurrent  treating with androgen deprivation referred to cancer center  . Smoking history     Surgical History: Past Surgical History:  Procedure Laterality Date  . CATARACT EXTRACTION Left 05/2017   Dr. Edilia Bo  . CATARACT EXTRACTION, BILATERAL  December 2016   [place], Dr. Raynelle Fanning  . COLONOSCOPY  03/01/01   Multiple polyps, divertic//adenomatous polyps  . COLONOSCOPY  04/24/01   multiple polyps  . COLONOSCOPY  10/23/01   Polyps benign-repeat every 2 years  . COLONOSCOPY  06/23/04   Adenom/hyperplastic polyps; multiple external hemorrhoids  . COLONOSCOPY  06/08/07   single small polyp-repeat 5 years  . CYSTOSCOPY WITH INSERTION OF UROLIFT N/A 04/10/2018   Procedure: CYSTOSCOPY WITH INSERTION OF UROLIFT;  Surgeon: Abbie Sons, MD;  Location: [facility] ORS;  Service: Urology;  Laterality: N/A;  . ESOPHAGOGASTRODUODENOSCOPY  03/01/01   H.H.; gastritis esoph. dudodenitis  . NCS/Wrists Left Carpal  2/02   Min./right improved  . PFTs  10/2012   FVC 64%, FEV1 41%, ratio 0.62  . PROSTATE BIOPSY  12/04   positive; radioactive seed implant (Dr. Joelyn Oms)  . Prostate Ext Beam Radiation  1/27-07/23/03   Dr. Danny Lawless  . SPIROMETRY  09/2011   WNL  . TONSILLECTOMY    . Wrist Release  6/98; 9/99   Right    Home Medications:  Allergies as of 12/19/2019   No Known Allergies     Medication List       Accurate as of December 19, 2019 11:59 PM. If you  have any questions, ask your nurse or doctor.        aspirin EC 81 MG tablet Take 81 mg by mouth daily.   dorzolamide-timolol 22.3-6.8 MG/ML ophthalmic solution Commonly known as: COSOPT Place 1 drop into the left eye 2 (two) times daily.   Eligard 22.5 MG injection Generic drug: Leuprolide Acetate (3 Month) Inject 22.5 mg into the skin every 3 (three) months.   ferrous sulfate 325 (65 FE) MG tablet Take 1 tablet (325 mg total) by mouth daily with breakfast.   lisinopril 10 MG tablet Commonly known as: ZESTRIL Take 1 tablet (10 mg  total) by mouth daily.   Lumigan 0.01 % Soln Generic drug: bimatoprost   omeprazole 40 MG capsule Commonly known as: PRILOSEC TAKE 1 CAPSULE EVERY DAY   pilocarpine 1 % ophthalmic solution Commonly known as: PILOCAR Place 1 drop into the left eye 3 (three) times daily.   rosuvastatin 10 MG tablet Commonly known as: CRESTOR TAKE 1 TABLET EVERY DAY   tamsulosin 0.4 MG Caps capsule Commonly known as: FLOMAX Take 0.4 mg by mouth. After supper   timolol 0.5 % ophthalmic solution Commonly known as: TIMOPTIC Place 1 drop into the left eye 2 (two) times daily.   trospium 20 MG tablet Commonly known as: SANCTURA TAKE ONE TABLET BY MOUTH EVERY NIGHT AT BEDTIME   venlafaxine XR 37.5 MG 24 hr capsule Commonly known as: EFFEXOR-XR TAKE 1 CAPSULE EVERY DAY WITH BREAKFAST   Vitamin D3 25 MCG (1000 UT) Caps Take 1 capsule (1,000 Units total) by mouth daily.   Xtandi 40 MG capsule Generic drug: enzalutamide TAKE 3 CAPSULES (120 MG TOTAL) BY MOUTH DAILY.       Allergies: No Known Allergies  Family History: Family History  Problem Relation Age of Onset  . Cancer Mother        colon  . Hyperlipidemia Mother   . Other Mother        Carotid disease  . Coronary artery disease Neg Hx   . Stroke Neg Hx     Social History:  reports that he quit smoking about 26 years ago. He has never used smokeless tobacco. He reports current alcohol use. He reports that he does not use drugs.   Physical Exam: BP (!) 99/64   Pulse 86   Ht 5\' 10"  (1.778 m)   Wt 134 lb (60.8 kg)   BMI 19.23 kg/m   Constitutional:  Alert and oriented, No acute distress. HEENT: [place] AT, moist mucus membranes.  Trachea midline, no masses. Cardiovascular: No clubbing, cyanosis, or edema. Respiratory: Normal respiratory effort, no increased work of breathing.  Assessment & Plan:    1.  Nocturia  PVR by bladder scan today 126 mL  Trial Myrbetriq 25 mg daily-samples given  Strict call back for worsening or  persistent symptoms  Continue annual follow-up  2.  Castrate resistant metastatic prostate cancer  Continue oncology follow-up   Abbie Sons, MD  [place] [address] [place], [place] [address] [phone]

## 2020-01-06 ENCOUNTER — Inpatient Hospital Stay: Payer: Medicare PPO | Attending: Internal Medicine

## 2020-01-06 ENCOUNTER — Other Ambulatory Visit: Payer: Self-pay

## 2020-01-06 DIAGNOSIS — Z79818 Long term (current) use of other agents affecting estrogen receptors and estrogen levels: Secondary | ICD-10-CM | POA: Insufficient documentation

## 2020-01-06 DIAGNOSIS — Z79899 Other long term (current) drug therapy: Secondary | ICD-10-CM | POA: Insufficient documentation

## 2020-01-06 DIAGNOSIS — Z87891 Personal history of nicotine dependence: Secondary | ICD-10-CM | POA: Insufficient documentation

## 2020-01-06 DIAGNOSIS — I129 Hypertensive chronic kidney disease with stage 1 through stage 4 chronic kidney disease, or unspecified chronic kidney disease: Secondary | ICD-10-CM | POA: Insufficient documentation

## 2020-01-06 DIAGNOSIS — Z7982 Long term (current) use of aspirin: Secondary | ICD-10-CM | POA: Insufficient documentation

## 2020-01-06 DIAGNOSIS — C7951 Secondary malignant neoplasm of bone: Secondary | ICD-10-CM | POA: Insufficient documentation

## 2020-01-06 DIAGNOSIS — E785 Hyperlipidemia, unspecified: Secondary | ICD-10-CM | POA: Insufficient documentation

## 2020-01-06 DIAGNOSIS — Z923 Personal history of irradiation: Secondary | ICD-10-CM | POA: Insufficient documentation

## 2020-01-06 DIAGNOSIS — C61 Malignant neoplasm of prostate: Secondary | ICD-10-CM | POA: Insufficient documentation

## 2020-01-06 DIAGNOSIS — R5383 Other fatigue: Secondary | ICD-10-CM | POA: Insufficient documentation

## 2020-01-06 DIAGNOSIS — R634 Abnormal weight loss: Secondary | ICD-10-CM | POA: Insufficient documentation

## 2020-01-06 DIAGNOSIS — R5381 Other malaise: Secondary | ICD-10-CM | POA: Insufficient documentation

## 2020-01-06 DIAGNOSIS — N183 Chronic kidney disease, stage 3 unspecified: Secondary | ICD-10-CM | POA: Insufficient documentation

## 2020-01-06 NOTE — Progress Notes (Signed)
Nutrition Follow-up:  Patient with metastatic prostate cancer to bone.  Patient xtandi has been placed on hold.    Met with patient for nutrition follow-up in clinic.  Patient reports appetite is good.  Reports that when he was taking that pill 3 times per day (chemo pill) he was having large bouts of loose stool 1-2 times per day.  Reports that he is not having loose stool/diarrhea anymore.  Reports that this am he ate 2 eggs, 2 strips bacon, 2 pieces of toast with jelly and hot tea.  Reports that he has been eating some dessert for bedtime snack after evening meal.  Reports that he is drinking 1 boost plus per day.      Medications: reviewed  Labs: reviewed  Anthropometrics:   Weight 140 lb 2 oz today increased from 134 lb on 7/29   NUTRITION DIAGNOSIS: Unintentional weight loss improved   INTERVENTION:  Continue boost plus shake daily Continue strategies to increase calories and protein Continue bedtime snack.  Patient has contact information    MONITORING, EVALUATION, GOAL: weight trends, intake   NEXT VISIT: Monday, Sept 20th phone f/u  Gideon Burstein B. Zenia Resides, [place], [place] Registered Dietitian [phone] (mobile)

## 2020-01-14 ENCOUNTER — Other Ambulatory Visit: Payer: Self-pay

## 2020-01-14 ENCOUNTER — Inpatient Hospital Stay: Payer: Medicare PPO

## 2020-01-14 DIAGNOSIS — C61 Malignant neoplasm of prostate: Secondary | ICD-10-CM | POA: Diagnosis not present

## 2020-01-14 DIAGNOSIS — Z923 Personal history of irradiation: Secondary | ICD-10-CM | POA: Diagnosis not present

## 2020-01-14 DIAGNOSIS — E785 Hyperlipidemia, unspecified: Secondary | ICD-10-CM | POA: Diagnosis not present

## 2020-01-14 DIAGNOSIS — N183 Chronic kidney disease, stage 3 unspecified: Secondary | ICD-10-CM | POA: Diagnosis not present

## 2020-01-14 DIAGNOSIS — Z7982 Long term (current) use of aspirin: Secondary | ICD-10-CM | POA: Diagnosis not present

## 2020-01-14 DIAGNOSIS — R634 Abnormal weight loss: Secondary | ICD-10-CM | POA: Diagnosis not present

## 2020-01-14 DIAGNOSIS — Z79899 Other long term (current) drug therapy: Secondary | ICD-10-CM | POA: Diagnosis not present

## 2020-01-14 DIAGNOSIS — C7951 Secondary malignant neoplasm of bone: Secondary | ICD-10-CM | POA: Diagnosis not present

## 2020-01-14 DIAGNOSIS — R5383 Other fatigue: Secondary | ICD-10-CM | POA: Diagnosis not present

## 2020-01-14 DIAGNOSIS — Z87891 Personal history of nicotine dependence: Secondary | ICD-10-CM | POA: Diagnosis not present

## 2020-01-14 DIAGNOSIS — I129 Hypertensive chronic kidney disease with stage 1 through stage 4 chronic kidney disease, or unspecified chronic kidney disease: Secondary | ICD-10-CM | POA: Diagnosis not present

## 2020-01-14 DIAGNOSIS — R5381 Other malaise: Secondary | ICD-10-CM | POA: Diagnosis not present

## 2020-01-14 DIAGNOSIS — Z79818 Long term (current) use of other agents affecting estrogen receptors and estrogen levels: Secondary | ICD-10-CM | POA: Diagnosis not present

## 2020-01-14 LAB — PSA: Prostatic Specific Antigen: 0.04 ng/mL (ref 0.00–4.00)

## 2020-01-14 LAB — COMPREHENSIVE METABOLIC PANEL
ALT: 10 U/L (ref 0–44)
AST: 15 U/L (ref 15–41)
Albumin: 3.7 g/dL (ref 3.5–5.0)
Alkaline Phosphatase: 55 U/L (ref 38–126)
Anion gap: 9 (ref 5–15)
BUN: 41 mg/dL — ABNORMAL HIGH (ref 8–23)
CO2: 25 mmol/L (ref 22–32)
Calcium: 9.1 mg/dL (ref 8.9–10.3)
Chloride: 106 mmol/L (ref 98–111)
Creatinine, Ser: 1.44 mg/dL — ABNORMAL HIGH (ref 0.61–1.24)
GFR calc Af Amer: 51 mL/min — ABNORMAL LOW (ref >60)
GFR calc non Af Amer: 44 mL/min — ABNORMAL LOW (ref >60)
Glucose, Bld: 114 mg/dL — ABNORMAL HIGH (ref 70–99)
Potassium: 4.5 mmol/L (ref 3.5–5.1)
Sodium: 140 mmol/L (ref 135–145)
Total Bilirubin: 0.3 mg/dL (ref 0.3–1.2)
Total Protein: 8.1 g/dL (ref 6.5–8.1)

## 2020-01-14 LAB — CBC WITH DIFFERENTIAL/PLATELET
Abs Immature Granulocytes: 0.03 10*3/uL (ref 0.00–0.07)
Basophils Absolute: 0 10*3/uL (ref 0.0–0.1)
Basophils Relative: 1 %
Eosinophils Absolute: 0.1 10*3/uL (ref 0.0–0.5)
Eosinophils Relative: 1 %
HCT: 33.5 % — ABNORMAL LOW (ref 39.0–52.0)
Hemoglobin: 10.5 g/dL — ABNORMAL LOW (ref 13.0–17.0)
Immature Granulocytes: 1 %
Lymphocytes Relative: 14 %
Lymphs Abs: 0.9 10*3/uL (ref 0.7–4.0)
MCH: 28.5 pg (ref 26.0–34.0)
MCHC: 31.3 g/dL (ref 30.0–36.0)
MCV: 90.8 fL (ref 80.0–100.0)
Monocytes Absolute: 0.5 10*3/uL (ref 0.1–1.0)
Monocytes Relative: 8 %
Neutro Abs: 5 10*3/uL (ref 1.7–7.7)
Neutrophils Relative %: 75 %
Platelets: 251 10*3/uL (ref 150–400)
RBC: 3.69 MIL/uL — ABNORMAL LOW (ref 4.22–5.81)
RDW: 15.2 % (ref 11.5–15.5)
WBC: 6.6 10*3/uL (ref 4.0–10.5)
nRBC: 0 % (ref 0.0–0.2)

## 2020-01-16 ENCOUNTER — Other Ambulatory Visit: Payer: Self-pay

## 2020-01-16 ENCOUNTER — Inpatient Hospital Stay: Payer: Medicare PPO

## 2020-01-16 ENCOUNTER — Inpatient Hospital Stay: Payer: Medicare PPO | Admitting: Internal Medicine

## 2020-01-16 DIAGNOSIS — H401133 Primary open-angle glaucoma, bilateral, severe stage: Secondary | ICD-10-CM | POA: Diagnosis not present

## 2020-01-16 MED ORDER — MIRABEGRON ER 25 MG PO TB24: 25.0000 mg | ORAL_TABLET | Freq: Every day | ORAL | 11 refills | Status: AC

## 2020-01-17 ENCOUNTER — Inpatient Hospital Stay: Payer: Medicare PPO

## 2020-01-17 ENCOUNTER — Other Ambulatory Visit: Payer: Self-pay

## 2020-01-17 ENCOUNTER — Encounter: Payer: Self-pay | Admitting: Internal Medicine

## 2020-01-17 ENCOUNTER — Inpatient Hospital Stay (HOSPITAL_BASED_OUTPATIENT_CLINIC_OR_DEPARTMENT_OTHER): Payer: Medicare PPO | Admitting: Internal Medicine

## 2020-01-17 DIAGNOSIS — R5381 Other malaise: Secondary | ICD-10-CM | POA: Diagnosis not present

## 2020-01-17 DIAGNOSIS — R634 Abnormal weight loss: Secondary | ICD-10-CM | POA: Diagnosis not present

## 2020-01-17 DIAGNOSIS — R5383 Other fatigue: Secondary | ICD-10-CM | POA: Diagnosis not present

## 2020-01-17 DIAGNOSIS — C7951 Secondary malignant neoplasm of bone: Secondary | ICD-10-CM | POA: Diagnosis not present

## 2020-01-17 DIAGNOSIS — N183 Chronic kidney disease, stage 3 unspecified: Secondary | ICD-10-CM | POA: Diagnosis not present

## 2020-01-17 DIAGNOSIS — I129 Hypertensive chronic kidney disease with stage 1 through stage 4 chronic kidney disease, or unspecified chronic kidney disease: Secondary | ICD-10-CM | POA: Diagnosis not present

## 2020-01-17 DIAGNOSIS — C61 Malignant neoplasm of prostate: Secondary | ICD-10-CM

## 2020-01-17 DIAGNOSIS — Z79818 Long term (current) use of other agents affecting estrogen receptors and estrogen levels: Secondary | ICD-10-CM | POA: Diagnosis not present

## 2020-01-17 DIAGNOSIS — Z923 Personal history of irradiation: Secondary | ICD-10-CM | POA: Diagnosis not present

## 2020-01-17 MED ORDER — LEUPROLIDE ACETATE (3 MONTH) 22.5 MG ~~LOC~~ KIT
22.5000 mg | PACK | Freq: Once | SUBCUTANEOUS | Status: AC
  Administered 2020-01-17: 22.5 mg via SUBCUTANEOUS
  Filled 2020-01-17: qty 22.5

## 2020-01-17 NOTE — Progress Notes (Signed)
[facility] OFFICE PROGRESS NOTE  Patient Care Team: [name], MD as PCP - General (Family Medicine) Bond, Tracie Harrier, MD as Consulting Physician (Ophthalmology) Abbie Sons, MD as Consulting Physician (Urology) Cammie Sickle, MD as Consulting Physician (Hematology and Oncology)  Cancer Staging No matching staging information was found for the patient.   Oncology History Overview Note  # external beam radiation therapy and radiation seed implant in 2005 with Dr. Joelyn Oms and Dr. Danny Lawless for T1c Gleason 3+4 = 7 prostate cancer with a pretreatment PSA of 6.8. In November 2015, he was found to have biochemical recurrence.  Dec 2016- CT a/p & Bone scan- NEG; PSA- 11; June 2017- 19; STARTED on Firmagon [Dr.Budzyn]; on Lupron q [place]  # JAN 2021-PSA rising-3.5.  Bone scan-progression of disease. START X-Tandi 120mg /day; stop in July 2021[[severe weight loss/fatigue]]; started January 17, 2020 80 mg a day   # OCT 2018- Anemia-? Etiology; Likely anemia of chronic disease [anti-CCP elevated/ but NO RA; rheumatology; ]BMBx- hypercellular 30%; relative myeloid hyperplasia/erythroid hypoplasia; FISH- 5/7/8-Normal. II OPINION at [facility]. [facility]- NEG  # CKD- III; Bladder neck obstruction s/p Foley [Dr.Stoiff]  # JAN 2018- Dexa scan- N  # NGS-P # PALLIATIVE CARE-P ------------------------------------   DIAGNOSIS: PROSTATE CANCER  STAGE:  IV       ;GOALS: control  CURRENT/MOST RECENT THERAPY: Lupron+ X-tandi    Primary adenocarcinoma of prostate ([place])      INTERVAL HISTORY:  Thomas Lopez 86 y.o.  male pleasant patient above history of metastatic prostate cancer to bone on Lupron/Xtandi; anemia iron deficiency/ CKD-is here for follow-up.  Patient was taken off Xtandi approximately a month ago because of his continued weight loss.  Since stopping Xtandi patient noted to have improvement of his weight.  His appetite improved.  No falls.  Mild  fatigue not any worse.  Review of Systems  Constitutional: Positive for malaise/fatigue and weight loss. Negative for chills, diaphoresis and fever.  HENT: Negative for nosebleeds and sore throat.   Eyes: Negative for double vision.  Respiratory: Positive for shortness of breath. Negative for cough, hemoptysis, sputum production and wheezing.   Cardiovascular: Negative for chest pain, palpitations, orthopnea and leg swelling.  Gastrointestinal: Negative for abdominal pain, blood in stool, constipation, diarrhea, heartburn, melena, nausea and vomiting.  Genitourinary: Negative for dysuria, frequency and urgency.  Musculoskeletal: Positive for joint pain. Negative for back pain.  Skin: Negative.  Negative for itching and rash.  Neurological: Negative for dizziness, tingling, focal weakness, weakness and headaches.  Endo/Heme/Allergies: Does not bruise/bleed easily.  Psychiatric/Behavioral: Negative for depression. The patient is not nervous/anxious and does not have insomnia.       PAST MEDICAL HISTORY :  Past Medical History:  Diagnosis Date  . Aorto-iliac atherosclerosis ([place]) 04/2015   by CT scan  . Carotid stenosis 11/2014   mild bilateral 1-39%, rpt 2 yrs  . Colon polyps    rpt colonoscopy due 2014  . ED (erectile dysfunction)    Trimix and VED  . Esophagitis   . Essential hypertension 06/29/2009  . GERD (gastroesophageal reflux disease)   . Glaucoma    severe ([place])  . HLD (hyperlipidemia)   . HTN (hypertension)   . Hyperglycemia   . IBS (irritable bowel syndrome)   . Prostate cancer [facility]) 2005   s/p seed implant, EBRT ([facility] Alinda [facility] at [facility]) recurrent treating with androgen deprivation referred to cancer center  . Smoking history     PAST SURGICAL HISTORY :  Past Surgical History:  Procedure Laterality Date  . CATARACT EXTRACTION Left 05/2017   Dr. Edilia Bo  . CATARACT EXTRACTION, BILATERAL  December 2016   [place], Dr. Raynelle Fanning  . COLONOSCOPY   03/01/01   Multiple polyps, divertic//adenomatous polyps  . COLONOSCOPY  04/24/01   multiple polyps  . COLONOSCOPY  10/23/01   Polyps benign-repeat every 2 years  . COLONOSCOPY  06/23/04   Adenom/hyperplastic polyps; multiple external hemorrhoids  . COLONOSCOPY  06/08/07   single small polyp-repeat 5 years  . CYSTOSCOPY WITH INSERTION OF UROLIFT N/A 04/10/2018   Procedure: CYSTOSCOPY WITH INSERTION OF UROLIFT;  Surgeon: Abbie Sons, MD;  Location: [facility] ORS;  Service: Urology;  Laterality: N/A;  . ESOPHAGOGASTRODUODENOSCOPY  03/01/01   H.H.; gastritis esoph. dudodenitis  . NCS/Wrists Left Carpal  2/02   Min./right improved  . PFTs  10/2012   FVC 64%, FEV1 41%, ratio 0.62  . PROSTATE BIOPSY  12/04   positive; radioactive seed implant (Dr. Joelyn Oms)  . Prostate Ext Beam Radiation  1/27-07/23/03   Dr. Danny Lawless  . SPIROMETRY  09/2011   WNL  . TONSILLECTOMY    . Wrist Release  6/98; 9/99   Right    FAMILY HISTORY :   Family History  Problem Relation Age of Onset  . Cancer Mother        colon  . Hyperlipidemia Mother   . Other Mother        Carotid disease  . Coronary artery disease Neg Hx   . Stroke Neg Hx     SOCIAL HISTORY:   Social History   Tobacco Use  . Smoking status: Former Smoker    Quit date: 05/23/1993    Years since quitting: 26.6  . Smokeless tobacco: Never Used  Vaping Use  . Vaping Use: Never used  Substance Use Topics  . Alcohol use: Yes    Comment: Rare  . Drug use: No    ALLERGIES:  has No Known Allergies.  MEDICATIONS:  Current Outpatient Medications  Medication Sig Dispense Refill  . aspirin EC 81 MG tablet Take 81 mg by mouth daily.    . Cholecalciferol (VITAMIN D3) 25 MCG (1000 UT) CAPS Take 1 capsule (1,000 Units total) by mouth daily. 30 capsule   . dorzolamide-timolol (COSOPT) 22.3-6.8 MG/ML ophthalmic solution Place 1 drop into the left eye 2 (two) times daily.    . ferrous sulfate 325 (65 FE) MG tablet Take 1 tablet (325 mg total) by mouth  daily with breakfast.  3  . Leuprolide Acetate, 3 Month, (ELIGARD) 22.5 MG injection Inject 22.5 mg into the skin every 3 (three) months. 1 each 3  . lisinopril (ZESTRIL) 10 MG tablet Take 1 tablet (10 mg total) by mouth daily. 90 tablet 3  . LUMIGAN 0.01 % SOLN     . mirabegron ER (MYRBETRIQ) 25 MG TB24 tablet Take 1 tablet (25 mg total) by mouth daily. 30 tablet 11  . omeprazole (PRILOSEC) 40 MG capsule TAKE 1 CAPSULE EVERY DAY 90 capsule 1  . pilocarpine (PILOCAR) 1 % ophthalmic solution Place 1 drop into the left eye 3 (three) times daily.    . rosuvastatin (CRESTOR) 10 MG tablet TAKE 1 TABLET EVERY DAY 90 tablet 1  . tamsulosin (FLOMAX) 0.4 MG CAPS capsule Take 0.4 mg by mouth. After supper    . timolol (TIMOPTIC) 0.5 % ophthalmic solution Place 1 drop into the left eye 2 (two) times daily.     . trospium (  SANCTURA) 20 MG tablet TAKE ONE TABLET BY MOUTH EVERY NIGHT AT BEDTIME 30 tablet 0  . venlafaxine XR (EFFEXOR-XR) 37.5 MG 24 hr capsule TAKE 1 CAPSULE EVERY DAY WITH BREAKFAST 90 capsule 1  . XTANDI 40 MG capsule TAKE 3 CAPSULES (120 MG TOTAL) BY MOUTH DAILY. (Patient not taking: Reported on 01/16/2020) 90 capsule 3   No current facility-administered medications for this visit.    PHYSICAL EXAMINATION: ECOG PERFORMANCE STATUS: 1 - Symptomatic but completely ambulatory  BP (!) 124/52   Pulse (!) 51   Temp (!) 96.4 F (35.8 C) (Tympanic)   Resp 16   Ht 5\' 10"  (1.778 m)   Wt 139 lb (63 kg)   SpO2 100%   BMI 19.94 kg/m   Filed Weights   01/17/20 1035  Weight: 139 lb (63 kg)    Physical Exam Constitutional:      Comments: Thin built cachectic appearing male patient .  He is alone.  Walking by himself.  Appears pale.  HENT:     Head: Normocephalic and atraumatic.     Mouth/Throat:     Pharynx: No oropharyngeal exudate.  Eyes:     Pupils: Pupils are equal, round, and reactive to light.  Cardiovascular:     Rate and Rhythm: Normal rate and regular rhythm.  Pulmonary:      Effort: No respiratory distress.     Breath sounds: No wheezing.  Abdominal:     General: Bowel sounds are normal. There is no distension.     Palpations: Abdomen is soft. There is no mass.     Tenderness: There is no abdominal tenderness. There is no guarding or rebound.  Musculoskeletal:        General: No tenderness. Normal range of motion.     Cervical back: Normal range of motion and neck supple.  Skin:    General: Skin is warm.  Neurological:     Mental Status: He is alert and oriented to person, place, and time.  Psychiatric:        Mood and Affect: Affect normal.    LABORATORY DATA:  I have reviewed the data as listed    Component Value Date/Time   NA 140 01/14/2020 1111   K 4.5 01/14/2020 1111   CL 106 01/14/2020 1111   CO2 25 01/14/2020 1111   GLUCOSE 114 (H) 01/14/2020 1111   BUN 41 (H) 01/14/2020 1111   CREATININE 1.44 (H) 01/14/2020 1111   CALCIUM 9.1 01/14/2020 1111   PROT 8.1 01/14/2020 1111   ALBUMIN 3.7 01/14/2020 1111   AST 15 01/14/2020 1111   ALT 10 01/14/2020 1111   ALKPHOS 55 01/14/2020 1111   BILITOT 0.3 01/14/2020 1111   GFRNONAA 44 (L) 01/14/2020 1111   GFRAA 51 (L) 01/14/2020 1111    No results found for: SPEP, UPEP  Lab Results  Component Value Date   WBC 6.6 01/14/2020   NEUTROABS 5.0 01/14/2020   HGB 10.5 (L) 01/14/2020   HCT 33.5 (L) 01/14/2020   MCV 90.8 01/14/2020   PLT 251 01/14/2020      Chemistry      Component Value Date/Time   NA 140 01/14/2020 1111   K 4.5 01/14/2020 1111   CL 106 01/14/2020 1111   CO2 25 01/14/2020 1111   BUN 41 (H) 01/14/2020 1111   CREATININE 1.44 (H) 01/14/2020 1111      Component Value Date/Time   CALCIUM 9.1 01/14/2020 1111   ALKPHOS 55 01/14/2020 1111   AST 15  01/14/2020 1111   ALT 10 01/14/2020 1111   BILITOT 0.3 01/14/2020 1111       RADIOGRAPHIC STUDIES: I have personally reviewed the radiological images as listed and agreed with the findings in the report. No results found.    ASSESSMENT & PLAN:  Primary adenocarcinoma of prostate ([place]) # Metastatic prostate cancer castrate resistant- on Lupron 22.5 mg;-bone- scan shows new lesions in right ilaic/ ribs/ calvarium-June 3rd PET- improved bone lesions;  prostate uptake noted. AUGUST  2021- 0.04; STABLE  # Clinically STABLE; re-start Xtnadi 2 pills [weight loss on 3 pills]; monitor closely.   #Hemoglobin -10.3-STABLE; s/p IV Venofer; HOLD Retacrit today.  #CKD stage III -GFR 48.STABLE.  # Bone lesions-castrate resistant prostate cancer; start Zometa infusions every 3 months. [Discuss]  # Weight loss-? X-tandi.stablized; monitor closley.   # DISPOSITION: # NO Retacrit today; Elirgrad today # in 4 weeks - MD; Zometa;  possible retacrit; 2-3 days prior labs- cbc/cmp; PSA;-Dr.B    Orders Placed This Encounter  Procedures  . CBC with Differential    Standing Status:   Standing    Number of Occurrences:   12    Standing Expiration Date:   01/16/2021  . Comprehensive metabolic panel    Standing Status:   Standing    Number of Occurrences:   12    Standing Expiration Date:   01/16/2021  . PSA    Standing Status:   Standing    Number of Occurrences:   12    Standing Expiration Date:   01/16/2021   All questions were answered. The patient knows to call the clinic with any problems, questions or concerns.      Cammie Sickle, MD 01/17/2020 2:13 PM

## 2020-01-17 NOTE — Assessment & Plan Note (Signed)
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 AUGUST  2021- 0.04; STABLE  # Clinically STABLE; re-start Xtnadi 2 pills [weight loss on 3 pills]; monitor closely.   #Hemoglobin -10.3-STABLE; s/p IV Venofer; HOLD Retacrit today.  #CKD stage III -GFR 48.STABLE.  # Bone lesions-castrate resistant prostate cancer; start Zometa infusions every 3 months. [Discuss]  # Weight loss-? X-tandi.stablized; monitor [place].   # DISPOSITION: # NO Retacrit today; Elirgrad today # in 4 weeks - MD; Zometa;  possible retacrit; 2-3 days prior labs- cbc/cmp; PSA;-Dr.B

## 2020-01-20 ENCOUNTER — Other Ambulatory Visit: Payer: Self-pay | Admitting: *Deleted

## 2020-01-20 DIAGNOSIS — C61 Malignant neoplasm of prostate: Secondary | ICD-10-CM

## 2020-01-21 NOTE — Telephone Encounter (Signed)
Dr. Jacinto Reap - Is patient still taking medication?

## 2020-01-22 ENCOUNTER — Other Ambulatory Visit: Payer: Self-pay | Admitting: Internal Medicine

## 2020-01-22 MED ORDER — XTANDI 40 MG PO CAPS: 80.0000 mg | ORAL_CAPSULE | Freq: Every day | ORAL | 3 refills | Status: DC

## 2020-01-24 DIAGNOSIS — S299XXA Unspecified injury of thorax, initial encounter: Secondary | ICD-10-CM | POA: Diagnosis not present

## 2020-01-24 DIAGNOSIS — S3991XA Unspecified injury of abdomen, initial encounter: Secondary | ICD-10-CM | POA: Diagnosis not present

## 2020-01-24 DIAGNOSIS — C61 Malignant neoplasm of prostate: Secondary | ICD-10-CM | POA: Diagnosis not present

## 2020-01-24 DIAGNOSIS — I739 Peripheral vascular disease, unspecified: Secondary | ICD-10-CM | POA: Diagnosis not present

## 2020-01-24 DIAGNOSIS — R9089 Other abnormal findings on diagnostic imaging of central nervous system: Secondary | ICD-10-CM | POA: Diagnosis not present

## 2020-01-24 DIAGNOSIS — R590 Localized enlarged lymph nodes: Secondary | ICD-10-CM | POA: Diagnosis not present

## 2020-01-24 DIAGNOSIS — M47812 Spondylosis without myelopathy or radiculopathy, cervical region: Secondary | ICD-10-CM | POA: Diagnosis not present

## 2020-01-24 DIAGNOSIS — N1330 Unspecified hydronephrosis: Secondary | ICD-10-CM | POA: Diagnosis not present

## 2020-01-24 DIAGNOSIS — S6992XA Unspecified injury of left wrist, hand and finger(s), initial encounter: Secondary | ICD-10-CM | POA: Diagnosis not present

## 2020-01-24 DIAGNOSIS — I129 Hypertensive chronic kidney disease with stage 1 through stage 4 chronic kidney disease, or unspecified chronic kidney disease: Secondary | ICD-10-CM | POA: Diagnosis not present

## 2020-01-24 DIAGNOSIS — Z87891 Personal history of nicotine dependence: Secondary | ICD-10-CM | POA: Diagnosis not present

## 2020-01-24 DIAGNOSIS — S59912A Unspecified injury of left forearm, initial encounter: Secondary | ICD-10-CM | POA: Diagnosis not present

## 2020-01-24 DIAGNOSIS — S199XXA Unspecified injury of neck, initial encounter: Secondary | ICD-10-CM | POA: Diagnosis not present

## 2020-01-24 DIAGNOSIS — J9811 Atelectasis: Secondary | ICD-10-CM | POA: Diagnosis not present

## 2020-01-24 DIAGNOSIS — S3992XA Unspecified injury of lower back, initial encounter: Secondary | ICD-10-CM | POA: Diagnosis not present

## 2020-01-24 DIAGNOSIS — M79602 Pain in left arm: Secondary | ICD-10-CM | POA: Diagnosis not present

## 2020-01-24 DIAGNOSIS — M19032 Primary osteoarthritis, left wrist: Secondary | ICD-10-CM | POA: Diagnosis not present

## 2020-01-24 DIAGNOSIS — S0081XA Abrasion of other part of head, initial encounter: Secondary | ICD-10-CM | POA: Diagnosis not present

## 2020-01-24 DIAGNOSIS — N390 Urinary tract infection, site not specified: Secondary | ICD-10-CM | POA: Diagnosis not present

## 2020-01-24 DIAGNOSIS — S0990XA Unspecified injury of head, initial encounter: Secondary | ICD-10-CM | POA: Diagnosis not present

## 2020-01-24 DIAGNOSIS — S50812A Abrasion of left forearm, initial encounter: Secondary | ICD-10-CM | POA: Diagnosis not present

## 2020-01-24 DIAGNOSIS — S0001XA Abrasion of scalp, initial encounter: Secondary | ICD-10-CM | POA: Diagnosis not present

## 2020-01-24 DIAGNOSIS — N3000 Acute cystitis without hematuria: Secondary | ICD-10-CM | POA: Diagnosis not present

## 2020-01-24 DIAGNOSIS — S3993XA Unspecified injury of pelvis, initial encounter: Secondary | ICD-10-CM | POA: Diagnosis not present

## 2020-01-24 DIAGNOSIS — N136 Pyonephrosis: Secondary | ICD-10-CM | POA: Diagnosis not present

## 2020-01-24 DIAGNOSIS — N183 Chronic kidney disease, stage 3 unspecified: Secondary | ICD-10-CM | POA: Diagnosis not present

## 2020-01-24 DIAGNOSIS — R918 Other nonspecific abnormal finding of lung field: Secondary | ICD-10-CM | POA: Diagnosis not present

## 2020-01-24 DIAGNOSIS — D649 Anemia, unspecified: Secondary | ICD-10-CM | POA: Diagnosis not present

## 2020-01-28 MED FILL — XTANDI 40 MG CAPSULE: 40 | 30 days supply | Qty: 60 | Fill #0

## 2020-02-10 ENCOUNTER — Inpatient Hospital Stay: Payer: Medicare PPO | Attending: Internal Medicine

## 2020-02-10 DIAGNOSIS — Z79818 Long term (current) use of other agents affecting estrogen receptors and estrogen levels: Secondary | ICD-10-CM | POA: Insufficient documentation

## 2020-02-10 DIAGNOSIS — Z7982 Long term (current) use of aspirin: Secondary | ICD-10-CM | POA: Insufficient documentation

## 2020-02-10 DIAGNOSIS — C61 Malignant neoplasm of prostate: Secondary | ICD-10-CM | POA: Insufficient documentation

## 2020-02-10 DIAGNOSIS — N183 Chronic kidney disease, stage 3 unspecified: Secondary | ICD-10-CM | POA: Insufficient documentation

## 2020-02-10 DIAGNOSIS — Z79899 Other long term (current) drug therapy: Secondary | ICD-10-CM | POA: Insufficient documentation

## 2020-02-10 DIAGNOSIS — R5381 Other malaise: Secondary | ICD-10-CM | POA: Insufficient documentation

## 2020-02-10 DIAGNOSIS — R5383 Other fatigue: Secondary | ICD-10-CM | POA: Insufficient documentation

## 2020-02-10 DIAGNOSIS — R634 Abnormal weight loss: Secondary | ICD-10-CM | POA: Insufficient documentation

## 2020-02-10 DIAGNOSIS — C7951 Secondary malignant neoplasm of bone: Secondary | ICD-10-CM | POA: Insufficient documentation

## 2020-02-10 DIAGNOSIS — K219 Gastro-esophageal reflux disease without esophagitis: Secondary | ICD-10-CM | POA: Insufficient documentation

## 2020-02-10 DIAGNOSIS — I129 Hypertensive chronic kidney disease with stage 1 through stage 4 chronic kidney disease, or unspecified chronic kidney disease: Secondary | ICD-10-CM | POA: Insufficient documentation

## 2020-02-10 DIAGNOSIS — D509 Iron deficiency anemia, unspecified: Secondary | ICD-10-CM | POA: Insufficient documentation

## 2020-02-10 DIAGNOSIS — R785 Finding of other psychotropic drug in blood: Secondary | ICD-10-CM | POA: Insufficient documentation
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
## 2020-02-12 ENCOUNTER — Other Ambulatory Visit: Payer: Self-pay

## 2020-02-12 ENCOUNTER — Inpatient Hospital Stay: Payer: Medicare PPO

## 2020-02-12 DIAGNOSIS — Z79899 Other long term (current) drug therapy: Secondary | ICD-10-CM | POA: Diagnosis not present

## 2020-02-12 DIAGNOSIS — I129 Hypertensive chronic kidney disease with stage 1 through stage 4 chronic kidney disease, or unspecified chronic kidney disease: Secondary | ICD-10-CM | POA: Diagnosis not present

## 2020-02-12 DIAGNOSIS — D509 Iron deficiency anemia, unspecified: Secondary | ICD-10-CM | POA: Diagnosis not present

## 2020-02-12 DIAGNOSIS — R785 Finding of other psychotropic drug in blood: Secondary | ICD-10-CM | POA: Diagnosis not present

## 2020-02-12 DIAGNOSIS — R5381 Other malaise: Secondary | ICD-10-CM | POA: Diagnosis not present

## 2020-02-12 DIAGNOSIS — Z7982 Long term (current) use of aspirin: Secondary | ICD-10-CM | POA: Diagnosis not present

## 2020-02-12 DIAGNOSIS — N183 Chronic kidney disease, stage 3 unspecified: Secondary | ICD-10-CM | POA: Diagnosis not present

## 2020-02-12 DIAGNOSIS — C61 Malignant neoplasm of prostate: Secondary | ICD-10-CM

## 2020-02-12 DIAGNOSIS — R634 Abnormal weight loss: Secondary | ICD-10-CM | POA: Diagnosis not present

## 2020-02-12 DIAGNOSIS — K219 Gastro-esophageal reflux disease without esophagitis: Secondary | ICD-10-CM | POA: Diagnosis not present

## 2020-02-12 DIAGNOSIS — R5383 Other fatigue: Secondary | ICD-10-CM | POA: Diagnosis not present

## 2020-02-12 DIAGNOSIS — Z79818 Long term (current) use of other agents affecting estrogen receptors and estrogen levels: Secondary | ICD-10-CM | POA: Diagnosis not present

## 2020-02-12 DIAGNOSIS — C7951 Secondary malignant neoplasm of bone: Secondary | ICD-10-CM | POA: Diagnosis not present

## 2020-02-12 LAB — COMPREHENSIVE METABOLIC PANEL
ALT: 9 U/L (ref 0–44)
AST: 14 U/L — ABNORMAL LOW (ref 15–41)
Albumin: 3.7 g/dL (ref 3.5–5.0)
Alkaline Phosphatase: 51 U/L (ref 38–126)
Anion gap: 9 (ref 5–15)
BUN: 46 mg/dL — ABNORMAL HIGH (ref 8–23)
CO2: 23 mmol/L (ref 22–32)
Calcium: 9.4 mg/dL (ref 8.9–10.3)
Chloride: 110 mmol/L (ref 98–111)
Creatinine, Ser: 1.64 mg/dL — ABNORMAL HIGH (ref 0.61–1.24)
GFR calc Af Amer: 44 mL/min — ABNORMAL LOW (ref >60)
GFR calc non Af Amer: 38 mL/min — ABNORMAL LOW (ref >60)
Glucose, Bld: 116 mg/dL — ABNORMAL HIGH (ref 70–99)
Potassium: 4.4 mmol/L (ref 3.5–5.1)
Sodium: 142 mmol/L (ref 135–145)
Total Bilirubin: 0.5 mg/dL (ref 0.3–1.2)
Total Protein: 7.7 g/dL (ref 6.5–8.1)

## 2020-02-12 LAB — CBC WITH DIFFERENTIAL/PLATELET
Abs Immature Granulocytes: 0.02 10*3/uL (ref 0.00–0.07)
Basophils Absolute: 0 10*3/uL (ref 0.0–0.1)
Basophils Relative: 0 %
Eosinophils Absolute: 0.1 10*3/uL (ref 0.0–0.5)
Eosinophils Relative: 1 %
HCT: 29.8 % — ABNORMAL LOW (ref 39.0–52.0)
Hemoglobin: 9.7 g/dL — ABNORMAL LOW (ref 13.0–17.0)
Immature Granulocytes: 0 %
Lymphocytes Relative: 13 %
Lymphs Abs: 1.1 10*3/uL (ref 0.7–4.0)
MCH: 28.6 pg (ref 26.0–34.0)
MCHC: 32.6 g/dL (ref 30.0–36.0)
MCV: 87.9 fL (ref 80.0–100.0)
Monocytes Absolute: 0.5 10*3/uL (ref 0.1–1.0)
Monocytes Relative: 6 %
Neutro Abs: 6.4 10*3/uL (ref 1.7–7.7)
Neutrophils Relative %: 80 %
Platelets: 272 10*3/uL (ref 150–400)
RBC: 3.39 MIL/uL — ABNORMAL LOW (ref 4.22–5.81)
RDW: 15 % (ref 11.5–15.5)
WBC: 8 10*3/uL (ref 4.0–10.5)
nRBC: 0 % (ref 0.0–0.2)

## 2020-02-12 LAB — PSA: Prostatic Specific Antigen: 0.06 ng/mL (ref 0.00–4.00)

## 2020-02-13 ENCOUNTER — Other Ambulatory Visit: Payer: Self-pay

## 2020-02-13 ENCOUNTER — Other Ambulatory Visit: Payer: Self-pay | Admitting: *Deleted

## 2020-02-13 DIAGNOSIS — H401133 Primary open-angle glaucoma, bilateral, severe stage: Secondary | ICD-10-CM | POA: Diagnosis not present

## 2020-02-13 DIAGNOSIS — D649 Anemia, unspecified: Secondary | ICD-10-CM

## 2020-02-13 LAB — FERRITIN: Ferritin: 31 ng/mL (ref 24–336)

## 2020-02-13 LAB — IRON AND TIBC
Iron: 57 ug/dL (ref 45–182)
Saturation Ratios: 15 % — ABNORMAL LOW (ref 17.9–39.5)
TIBC: 391 ug/dL (ref 250–450)
UIBC: 334 ug/dL

## 2020-02-14 ENCOUNTER — Other Ambulatory Visit: Payer: Self-pay

## 2020-02-14 ENCOUNTER — Encounter: Payer: Self-pay | Admitting: Internal Medicine

## 2020-02-14 ENCOUNTER — Inpatient Hospital Stay: Payer: Medicare PPO

## 2020-02-14 ENCOUNTER — Inpatient Hospital Stay (HOSPITAL_BASED_OUTPATIENT_CLINIC_OR_DEPARTMENT_OTHER): Payer: Medicare PPO | Admitting: Internal Medicine

## 2020-02-14 VITALS — BP 120/60 | HR 70

## 2020-02-14 DIAGNOSIS — R5381 Other malaise: Secondary | ICD-10-CM | POA: Diagnosis not present

## 2020-02-14 DIAGNOSIS — C61 Malignant neoplasm of prostate: Secondary | ICD-10-CM

## 2020-02-14 DIAGNOSIS — R5383 Other fatigue: Secondary | ICD-10-CM | POA: Diagnosis not present

## 2020-02-14 DIAGNOSIS — E860 Dehydration: Secondary | ICD-10-CM

## 2020-02-14 DIAGNOSIS — C7951 Secondary malignant neoplasm of bone: Secondary | ICD-10-CM | POA: Diagnosis not present

## 2020-02-14 DIAGNOSIS — R634 Abnormal weight loss: Secondary | ICD-10-CM | POA: Diagnosis not present

## 2020-02-14 DIAGNOSIS — I129 Hypertensive chronic kidney disease with stage 1 through stage 4 chronic kidney disease, or unspecified chronic kidney disease: Secondary | ICD-10-CM | POA: Diagnosis not present

## 2020-02-14 DIAGNOSIS — D509 Iron deficiency anemia, unspecified: Secondary | ICD-10-CM | POA: Diagnosis not present

## 2020-02-14 DIAGNOSIS — Z79818 Long term (current) use of other agents affecting estrogen receptors and estrogen levels: Secondary | ICD-10-CM | POA: Diagnosis not present

## 2020-02-14 DIAGNOSIS — N183 Chronic kidney disease, stage 3 unspecified: Secondary | ICD-10-CM | POA: Diagnosis not present

## 2020-02-14 MED ORDER — SODIUM CHLORIDE 0.9 % IV SOLN
INTRAVENOUS | Status: DC
  Filled 2020-02-14 (×2): qty 250

## 2020-02-14 NOTE — Progress Notes (Signed)
[place] OFFICE PROGRESS NOTE  Patient Care Team: [name], MD as PCP - General (Family Medicine) Bond, Tracie Harrier, MD as Consulting Physician (Ophthalmology) Abbie Sons, MD as Consulting Physician (Urology) Cammie Sickle, MD as Consulting Physician (Hematology and Oncology)  Cancer Staging No matching staging information was found for the patient.   Oncology History Overview Note  # external beam radiation therapy and radiation seed implant in 2005 with Dr. Joelyn Oms and Dr. Danny Lawless for T1c Gleason 3+4 = 7 prostate cancer with a pretreatment PSA of 6.8. In November 2015, he was found to have biochemical recurrence.  Dec 2016- CT a/p & Bone scan- NEG; PSA- 11; June 2017- 19; STARTED on Firmagon [Dr.Budzyn]; on Lupron q [place]  # JAN 2021-PSA rising-3.5.  Bone scan-progression of disease. START X-Tandi 120mg /day; stop in July 2021[[severe weight loss/fatigue]]; started January 17, 2020 80 mg a day   # OCT 2018- Anemia-? Etiology; Likely anemia of chronic disease [anti-CCP elevated/ but NO RA; rheumatology; ]BMBx- hypercellular 30%; relative myeloid hyperplasia/erythroid hypoplasia; FISH- 5/7/8-Normal. II OPINION at [facility]. [facility] ONE HEM- NEG  # CKD- III; Bladder neck obstruction s/p Foley [Dr.Stoiff]  # JAN 2018- Dexa scan- N  # NGS-P # PALLIATIVE CARE-P ------------------------------------   DIAGNOSIS: PROSTATE CANCER  STAGE:  IV       ;GOALS: control  CURRENT/MOST RECENT THERAPY: Lupron+ X-tandi    Primary adenocarcinoma of prostate ([place])      INTERVAL HISTORY:  Thomas Lopez 86 y.o.  male pleasant patient above history of metastatic prostate cancer to bone on Lupron/Xtandi; anemia iron deficiency/ CKD-is here for follow-up.  Patient states he is feeling poorly.  Denies any nausea vomiting.  Denies any dizziness.  Denies any falls.  He just feels tired.  Denies any worsening pain.  His appetite otherwise is fair.  No weight loss.   No fever no chills.   Review of Systems  Constitutional: Positive for malaise/fatigue and weight loss. Negative for chills, diaphoresis and fever.  HENT: Negative for nosebleeds and sore throat.   Eyes: Negative for double vision.  Respiratory: Negative for cough, hemoptysis, sputum production and wheezing.   Cardiovascular: Negative for chest pain, palpitations, orthopnea and leg swelling.  Gastrointestinal: Negative for abdominal pain, blood in stool, constipation, diarrhea, heartburn, melena, nausea and vomiting.  Genitourinary: Negative for dysuria, frequency and urgency.  Musculoskeletal: Positive for joint pain. Negative for back pain.  Skin: Negative.  Negative for itching and rash.  Neurological: Negative for dizziness, tingling, focal weakness, weakness and headaches.  Endo/Heme/Allergies: Does not bruise/bleed easily.  Psychiatric/Behavioral: Negative for depression. The patient is not nervous/anxious and does not have insomnia.       PAST MEDICAL HISTORY :  Past Medical History:  Diagnosis Date  . Aorto-iliac atherosclerosis ([place]) 04/2015   by CT scan  . Carotid stenosis 11/2014   mild bilateral 1-39%, rpt 2 yrs  . Colon polyps    rpt colonoscopy due 2014  . ED (erectile dysfunction)    Trimix and VED  . Esophagitis   . Essential hypertension 06/29/2009  . GERD (gastroesophageal reflux disease)   . Glaucoma    severe ([place])  . HLD (hyperlipidemia)   . HTN (hypertension)   . Hyperglycemia   . IBS (irritable bowel syndrome)   . Prostate cancer [facility]) 2005   s/p seed implant, EBRT (Dr. Alinda Money at [facility]) recurrent treating with androgen deprivation referred to cancer center  . Smoking history     PAST SURGICAL HISTORY :  Past Surgical History:  Procedure Laterality Date  . CATARACT EXTRACTION Left 05/2017   Dr. Edilia Bo  . CATARACT EXTRACTION, BILATERAL  December 2016   [place], Dr. Raynelle Fanning  . COLONOSCOPY  03/01/01   Multiple polyps,  divertic//adenomatous polyps  . COLONOSCOPY  04/24/01   multiple polyps  . COLONOSCOPY  10/23/01   Polyps benign-repeat every 2 years  . COLONOSCOPY  06/23/04   Adenom/hyperplastic polyps; multiple external hemorrhoids  . COLONOSCOPY  06/08/07   single small polyp-repeat 5 years  . CYSTOSCOPY WITH INSERTION OF UROLIFT N/A 04/10/2018   Procedure: CYSTOSCOPY WITH INSERTION OF UROLIFT;  Surgeon: Abbie Sons, MD;  Location: [facility] ORS;  Service: Urology;  Laterality: N/A;  . ESOPHAGOGASTRODUODENOSCOPY  03/01/01   H.H.; gastritis esoph. dudodenitis  . NCS/Wrists Left Carpal  2/02   Min./right improved  . PFTs  10/2012   FVC 64%, FEV1 41%, ratio 0.62  . PROSTATE BIOPSY  12/04   positive; radioactive seed implant (Dr. Joelyn Oms)  . Prostate Ext Beam Radiation  1/27-07/23/03   Dr. Danny Lawless  . SPIROMETRY  09/2011   WNL  . TONSILLECTOMY    . Wrist Release  6/98; 9/99   Right    FAMILY HISTORY :   Family History  Problem Relation Age of Onset  . Cancer Mother        colon  . Hyperlipidemia Mother   . Other Mother        Carotid disease  . Coronary artery disease Neg Hx   . Stroke Neg Hx     SOCIAL HISTORY:   Social History   Tobacco Use  . Smoking status: Former Smoker    Quit date: 05/23/1993    Years since quitting: 26.7  . Smokeless tobacco: Never Used  Vaping Use  . Vaping Use: Never used  Substance Use Topics  . Alcohol use: Yes    Comment: Rare  . Drug use: No    ALLERGIES:  has No Known Allergies.  MEDICATIONS:  Current Outpatient Medications  Medication Sig Dispense Refill  . aspirin EC 81 MG tablet Take 81 mg by mouth daily.    . Cholecalciferol (VITAMIN D3) 25 MCG (1000 UT) CAPS Take 1 capsule (1,000 Units total) by mouth daily. 30 capsule   . dorzolamide-timolol (COSOPT) 22.3-6.8 MG/ML ophthalmic solution Place 1 drop into the left eye 2 (two) times daily.    . enzalutamide (XTANDI) 40 MG capsule Take 2 capsules (80 mg total) by mouth daily. 60 capsule 3  .  ferrous sulfate 325 (65 FE) MG tablet Take 1 tablet (325 mg total) by mouth daily with breakfast.  3  . Leuprolide Acetate, 3 Month, (ELIGARD) 22.5 MG injection Inject 22.5 mg into the skin every 3 (three) months. 1 each 3  . lisinopril (ZESTRIL) 10 MG tablet Take 1 tablet (10 mg total) by mouth daily. 90 tablet 3  . LUMIGAN 0.01 % SOLN     . mirabegron ER (MYRBETRIQ) 25 MG TB24 tablet Take 1 tablet (25 mg total) by mouth daily. 30 tablet 11  . omeprazole (PRILOSEC) 40 MG capsule TAKE 1 CAPSULE EVERY DAY 90 capsule 1  . pilocarpine (PILOCAR) 1 % ophthalmic solution Place 1 drop into the left eye 3 (three) times daily.    . rosuvastatin (CRESTOR) 10 MG tablet TAKE 1 TABLET EVERY DAY 90 tablet 1  . tamsulosin (FLOMAX) 0.4 MG CAPS capsule Take 0.4 mg by mouth. After supper    . timolol (TIMOPTIC) 0.5 %  ophthalmic solution Place 1 drop into the left eye 2 (two) times daily.     . trospium (SANCTURA) 20 MG tablet TAKE ONE TABLET BY MOUTH EVERY NIGHT AT BEDTIME 30 tablet 0  . venlafaxine XR (EFFEXOR-XR) 37.5 MG 24 hr capsule TAKE 1 CAPSULE EVERY DAY WITH BREAKFAST 90 capsule 1   No current facility-administered medications for this visit.    PHYSICAL EXAMINATION: ECOG PERFORMANCE STATUS: 1 - Symptomatic but completely ambulatory  BP (!) 93/46 (BP Location: Left Arm, Patient Position: Sitting, Cuff Size: Normal)   Pulse 63   Temp 98.5 F (36.9 C) (Tympanic)   Resp 16   Ht 5\' 10"  (1.778 m)   Wt 138 lb (62.6 kg)   SpO2 100%   BMI 19.80 kg/m   Filed Weights   02/14/20 1335  Weight: 138 lb (62.6 kg)    Physical Exam Constitutional:      Comments: Thin built cachectic appearing male patient .  He is alone.  Walking by himself.  Appears pale.  HENT:     Head: Normocephalic and atraumatic.     Mouth/Throat:     Pharynx: No oropharyngeal exudate.  Eyes:     Pupils: Pupils are equal, round, and reactive to light.  Cardiovascular:     Rate and Rhythm: Normal rate and regular rhythm.   Pulmonary:     Effort: No respiratory distress.     Breath sounds: No wheezing.  Abdominal:     General: Bowel sounds are normal. There is no distension.     Palpations: Abdomen is soft. There is no mass.     Tenderness: There is no abdominal tenderness. There is no guarding or rebound.  Musculoskeletal:        General: No tenderness. Normal range of motion.     Cervical back: Normal range of motion and neck supple.  Skin:    General: Skin is warm.  Neurological:     Mental Status: He is alert and oriented to person, place, and time.  Psychiatric:        Mood and Affect: Affect normal.    LABORATORY DATA:  I have reviewed the data as listed    Component Value Date/Time   NA 142 02/12/2020 1319   K 4.4 02/12/2020 1319   CL 110 02/12/2020 1319   CO2 23 02/12/2020 1319   GLUCOSE 116 (H) 02/12/2020 1319   BUN 46 (H) 02/12/2020 1319   CREATININE 1.64 (H) 02/12/2020 1319   CALCIUM 9.4 02/12/2020 1319   PROT 7.7 02/12/2020 1319   ALBUMIN 3.7 02/12/2020 1319   AST 14 (L) 02/12/2020 1319   ALT 9 02/12/2020 1319   ALKPHOS 51 02/12/2020 1319   BILITOT 0.5 02/12/2020 1319   GFRNONAA 38 (L) 02/12/2020 1319   GFRAA 44 (L) 02/12/2020 1319    No results found for: SPEP, UPEP  Lab Results  Component Value Date   WBC 8.0 02/12/2020   NEUTROABS 6.4 02/12/2020   HGB 9.7 (L) 02/12/2020   HCT 29.8 (L) 02/12/2020   MCV 87.9 02/12/2020   PLT 272 02/12/2020      Chemistry      Component Value Date/Time   NA 142 02/12/2020 1319   K 4.4 02/12/2020 1319   CL 110 02/12/2020 1319   CO2 23 02/12/2020 1319   BUN 46 (H) 02/12/2020 1319   CREATININE 1.64 (H) 02/12/2020 1319      Component Value Date/Time   CALCIUM 9.4 02/12/2020 1319   ALKPHOS 51 02/12/2020 1319  AST 14 (L) 02/12/2020 1319   ALT 9 02/12/2020 1319   BILITOT 0.5 02/12/2020 1319       RADIOGRAPHIC STUDIES: I have personally reviewed the radiological images as listed and agreed with the findings in the  report. No results found.   ASSESSMENT & PLAN:  Primary adenocarcinoma of prostate ([place]) # Metastatic prostate cancer castrate resistant- on Lupron 22.5 mg;-bone- scan shows new lesions in right ilaic/ ribs/ calvarium-June 3rd PET- improved bone lesions;  prostate uptake noted. SEP  2021- 0.06; STABLE.   # Clinically STABLE; re-start Xtandi 2 pills [weight loss on 3 pills]; monitor closely.   # Fatigued/feeling poorly- ? Etiology; borderline low blood pressures.  Recommend IV fluids today.  Recommend increase fluid intake at home.  #Hemoglobin -9.7; ok with retacrit, but awaiting insurance approval.  September 2021- iron saturation 15 ferritin 33  #CKD stage III -GFR 48.STABLE.  # Bone lesions-castrate resistant prostate cancer; start Zometa infusions every 3 months. [Discuss]; HOLD today given pt feling poorly.   # DISPOSITION: # NO RETACRIT; NO ZOMETA; IVFs 1 lit over 1 hour # in 4 weeks - MD; Zometa;  possible retacrit; 2-3 days prior labs- cbc/cmp; PSA;-Dr.B    No orders of the defined types were placed in this encounter.  All questions were answered. The patient knows to call the clinic with any problems, questions or concerns.      Cammie Sickle, MD 02/17/2020 7:34 AM

## 2020-02-14 NOTE — Assessment & Plan Note (Addendum)
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 SEP  2021- 0.06; STABLE.   # Clinically STABLE; re-start Xtandi 2 pills [weight loss on 3 pills]; monitor closely.   # Fatigued/feeling poorly- ? Etiology; borderline low blood pressures.  Recommend IV fluids today.  Recommend increase fluid intake at home.  #Hemoglobin -9.7; ok with retacrit, but awaiting insurance approval.  September 2021- iron saturation 15 ferritin 33  #CKD stage III -GFR 48.STABLE.  # Bone lesions-castrate resistant prostate cancer; start Zometa infusions every 3 months. [Discuss]; HOLD today given pt feling poorly.   # DISPOSITION: # NO RETACRIT; NO ZOMETA; IVFs 1 lit over 1 hour # in 4 weeks - MD; Zometa;  possible retacrit; 2-3 days prior labs- cbc/cmp; PSA;-Dr.B  Addendum: Retacrit finally approved-we will plan Retacrit week of 9/27; plan venofer 1 week later. GB

## 2020-02-14 NOTE — Progress Notes (Signed)
Woke up this morning feeling weak and very fatigue.

## 2020-02-19 ENCOUNTER — Inpatient Hospital Stay: Payer: Medicare PPO

## 2020-02-20 ENCOUNTER — Inpatient Hospital Stay: Payer: Medicare PPO

## 2020-02-20 ENCOUNTER — Other Ambulatory Visit: Payer: Self-pay

## 2020-02-20 VITALS — BP 99/62 | HR 50

## 2020-02-20 DIAGNOSIS — C61 Malignant neoplasm of prostate: Secondary | ICD-10-CM

## 2020-02-20 DIAGNOSIS — Z79818 Long term (current) use of other agents affecting estrogen receptors and estrogen levels: Secondary | ICD-10-CM | POA: Diagnosis not present

## 2020-02-20 DIAGNOSIS — R5383 Other fatigue: Secondary | ICD-10-CM | POA: Diagnosis not present

## 2020-02-20 DIAGNOSIS — D509 Iron deficiency anemia, unspecified: Secondary | ICD-10-CM | POA: Diagnosis not present

## 2020-02-20 DIAGNOSIS — N183 Chronic kidney disease, stage 3 unspecified: Secondary | ICD-10-CM | POA: Diagnosis not present

## 2020-02-20 DIAGNOSIS — I129 Hypertensive chronic kidney disease with stage 1 through stage 4 chronic kidney disease, or unspecified chronic kidney disease: Secondary | ICD-10-CM | POA: Diagnosis not present

## 2020-02-20 DIAGNOSIS — C7951 Secondary malignant neoplasm of bone: Secondary | ICD-10-CM | POA: Diagnosis not present

## 2020-02-20 DIAGNOSIS — R634 Abnormal weight loss: Secondary | ICD-10-CM | POA: Diagnosis not present

## 2020-02-20 DIAGNOSIS — R5381 Other malaise: Secondary | ICD-10-CM | POA: Diagnosis not present

## 2020-02-20 MED ORDER — EPOETIN ALFA-EPBX 10000 UNIT/ML IJ SOLN
20000.0000 [IU] | Freq: Once | INTRAMUSCULAR | Status: AC
  Administered 2020-02-20: 20000 [IU] via SUBCUTANEOUS
  Filled 2020-02-20: qty 2

## 2020-02-25 ENCOUNTER — Telehealth: Payer: Self-pay

## 2020-02-25 NOTE — Telephone Encounter (Signed)
[place] would like to know if it is okay to stop aspirin 81 mg for patient 7 days before his surgery?   They faxed a form today and want to make sure we received it. She said call them and let them know when you do and fax form back to [phone].

## 2020-02-26 ENCOUNTER — Inpatient Hospital Stay: Payer: Medicare PPO

## 2020-02-26 MED FILL — XTANDI 40 MG CAPSULE: 40 | 30 days supply | Qty: 60 | Fill #1

## 2020-02-26 NOTE — Telephone Encounter (Signed)
Just received form today.  Filled out and in Lisa's box. plz call pt to hold aspirin starting today for surgery planned 03/03/2020.   Indication for aspirin: h/o CAD, PAD, aorto-iliac ATH. No h/o CVA or MI - should be ok to hold aspirin.

## 2020-02-26 NOTE — Telephone Encounter (Signed)
Spoke with pt relaying Dr. Synthia Innocent instructions.  Pt verbalizes understanding.   Faxed form.

## 2020-02-28 ENCOUNTER — Telehealth: Payer: Self-pay

## 2020-02-28 NOTE — Telephone Encounter (Signed)
Spoke with pt relaying Dr. G's message. Pt verbalizes understanding.  

## 2020-02-28 NOTE — Telephone Encounter (Signed)
Pt called saying he is having surgery on his eyes 03-09-20. The last few weeks he has episodes with a cough and a runny nose. Then sometimes he doesn't. The last few days he has been fine. They told him to call his PCP to get something for his "cold". I told him I would get with Dr Darnell Level to see if he had a suggestion as to what he could take. I suggested he could be having issues with allergies. Please advise. [phone]

## 2020-02-28 NOTE — Telephone Encounter (Signed)
May try claritin antihistamine OTC.  Let us know if worsening cough or congestion.

## 2020-03-02 ENCOUNTER — Other Ambulatory Visit: Payer: Self-pay

## 2020-03-02 ENCOUNTER — Inpatient Hospital Stay: Payer: Medicare PPO | Attending: Internal Medicine

## 2020-03-02 VITALS — BP 139/59 | HR 65 | Temp 97.1°F

## 2020-03-02 DIAGNOSIS — Z87891 Personal history of nicotine dependence: Secondary | ICD-10-CM | POA: Insufficient documentation

## 2020-03-02 DIAGNOSIS — C61 Malignant neoplasm of prostate: Secondary | ICD-10-CM

## 2020-03-02 DIAGNOSIS — R634 Abnormal weight loss: Secondary | ICD-10-CM | POA: Diagnosis not present

## 2020-03-02 DIAGNOSIS — Z79899 Other long term (current) drug therapy: Secondary | ICD-10-CM | POA: Insufficient documentation

## 2020-03-02 DIAGNOSIS — Z7982 Long term (current) use of aspirin: Secondary | ICD-10-CM | POA: Insufficient documentation

## 2020-03-02 DIAGNOSIS — N183 Chronic kidney disease, stage 3 unspecified: Secondary | ICD-10-CM | POA: Diagnosis not present

## 2020-03-02 DIAGNOSIS — Z923 Personal history of irradiation: Secondary | ICD-10-CM | POA: Diagnosis not present

## 2020-03-02 DIAGNOSIS — I129 Hypertensive chronic kidney disease with stage 1 through stage 4 chronic kidney disease, or unspecified chronic kidney disease: Secondary | ICD-10-CM | POA: Diagnosis not present

## 2020-03-02 DIAGNOSIS — R5381 Other malaise: Secondary | ICD-10-CM | POA: Insufficient documentation

## 2020-03-02 DIAGNOSIS — E785 Hyperlipidemia, unspecified: Secondary | ICD-10-CM | POA: Diagnosis not present

## 2020-03-02 DIAGNOSIS — D509 Iron deficiency anemia, unspecified: Secondary | ICD-10-CM | POA: Insufficient documentation

## 2020-03-02 DIAGNOSIS — C7951 Secondary malignant neoplasm of bone: Secondary | ICD-10-CM | POA: Insufficient documentation

## 2020-03-02 DIAGNOSIS — H409 Unspecified glaucoma: Secondary | ICD-10-CM | POA: Diagnosis not present

## 2020-03-02 DIAGNOSIS — Z79818 Long term (current) use of other agents affecting estrogen receptors and estrogen levels: Secondary | ICD-10-CM | POA: Diagnosis not present

## 2020-03-02 DIAGNOSIS — R5383 Other fatigue: Secondary | ICD-10-CM | POA: Diagnosis not present

## 2020-03-02 MED ORDER — IRON SUCROSE 20 MG/ML IV SOLN
200.0000 mg | Freq: Once | INTRAVENOUS | Status: AC
  Administered 2020-03-02: 200 mg via INTRAVENOUS
  Filled 2020-03-02: qty 10

## 2020-03-02 MED ORDER — SODIUM CHLORIDE 0.9 % IV SOLN
Freq: Once | INTRAVENOUS | Status: AC
  Filled 2020-03-02: qty 250

## 2020-03-03 DIAGNOSIS — H401133 Primary open-angle glaucoma, bilateral, severe stage: Secondary | ICD-10-CM | POA: Diagnosis not present

## 2020-03-03 DIAGNOSIS — R059 Cough, unspecified: Secondary | ICD-10-CM | POA: Diagnosis not present

## 2020-03-03 DIAGNOSIS — H401123 Primary open-angle glaucoma, left eye, severe stage: Secondary | ICD-10-CM | POA: Diagnosis not present

## 2020-03-03 DIAGNOSIS — R062 Wheezing: Secondary | ICD-10-CM | POA: Diagnosis not present

## 2020-03-04 DIAGNOSIS — H401133 Primary open-angle glaucoma, bilateral, severe stage: Secondary | ICD-10-CM | POA: Diagnosis not present

## 2020-03-04 DIAGNOSIS — Z79899 Other long term (current) drug therapy: Secondary | ICD-10-CM | POA: Diagnosis not present

## 2020-03-04 DIAGNOSIS — Z7952 Long term (current) use of systemic steroids: Secondary | ICD-10-CM | POA: Diagnosis not present

## 2020-03-11 ENCOUNTER — Other Ambulatory Visit: Payer: Self-pay

## 2020-03-11 ENCOUNTER — Inpatient Hospital Stay: Payer: Medicare PPO

## 2020-03-11 DIAGNOSIS — R5381 Other malaise: Secondary | ICD-10-CM | POA: Diagnosis not present

## 2020-03-11 DIAGNOSIS — C61 Malignant neoplasm of prostate: Secondary | ICD-10-CM

## 2020-03-11 DIAGNOSIS — C7951 Secondary malignant neoplasm of bone: Secondary | ICD-10-CM | POA: Diagnosis not present

## 2020-03-11 DIAGNOSIS — I129 Hypertensive chronic kidney disease with stage 1 through stage 4 chronic kidney disease, or unspecified chronic kidney disease: Secondary | ICD-10-CM | POA: Diagnosis not present

## 2020-03-11 DIAGNOSIS — D509 Iron deficiency anemia, unspecified: Secondary | ICD-10-CM | POA: Diagnosis not present

## 2020-03-11 DIAGNOSIS — Z923 Personal history of irradiation: Secondary | ICD-10-CM | POA: Diagnosis not present

## 2020-03-11 DIAGNOSIS — R634 Abnormal weight loss: Secondary | ICD-10-CM | POA: Diagnosis not present

## 2020-03-11 DIAGNOSIS — R5383 Other fatigue: Secondary | ICD-10-CM | POA: Diagnosis not present

## 2020-03-11 DIAGNOSIS — Z79818 Long term (current) use of other agents affecting estrogen receptors and estrogen levels: Secondary | ICD-10-CM | POA: Diagnosis not present

## 2020-03-11 LAB — CBC WITH DIFFERENTIAL/PLATELET
Abs Immature Granulocytes: 0.03 10*3/uL (ref 0.00–0.07)
Basophils Absolute: 0 10*3/uL (ref 0.0–0.1)
Basophils Relative: 0 %
Eosinophils Absolute: 0 10*3/uL (ref 0.0–0.5)
Eosinophils Relative: 1 %
HCT: 32.1 % — ABNORMAL LOW (ref 39.0–52.0)
Hemoglobin: 9.8 g/dL — ABNORMAL LOW (ref 13.0–17.0)
Immature Granulocytes: 0 %
Lymphocytes Relative: 13 %
Lymphs Abs: 1 10*3/uL (ref 0.7–4.0)
MCH: 27.1 pg (ref 26.0–34.0)
MCHC: 30.5 g/dL (ref 30.0–36.0)
MCV: 88.9 fL (ref 80.0–100.0)
Monocytes Absolute: 0.5 10*3/uL (ref 0.1–1.0)
Monocytes Relative: 7 %
Neutro Abs: 6.3 10*3/uL (ref 1.7–7.7)
Neutrophils Relative %: 79 %
Platelets: 424 10*3/uL — ABNORMAL HIGH (ref 150–400)
RBC: 3.61 MIL/uL — ABNORMAL LOW (ref 4.22–5.81)
RDW: 15 % (ref 11.5–15.5)
WBC: 8 10*3/uL (ref 4.0–10.5)
nRBC: 0 % (ref 0.0–0.2)

## 2020-03-11 LAB — COMPREHENSIVE METABOLIC PANEL
ALT: 9 U/L (ref 0–44)
AST: 15 U/L (ref 15–41)
Albumin: 3.7 g/dL (ref 3.5–5.0)
Alkaline Phosphatase: 64 U/L (ref 38–126)
Anion gap: 11 (ref 5–15)
BUN: 42 mg/dL — ABNORMAL HIGH (ref 8–23)
CO2: 23 mmol/L (ref 22–32)
Calcium: 9.6 mg/dL (ref 8.9–10.3)
Chloride: 108 mmol/L (ref 98–111)
Creatinine, Ser: 1.55 mg/dL — ABNORMAL HIGH (ref 0.61–1.24)
GFR, Estimated: 40 mL/min — ABNORMAL LOW (ref >60)
Glucose, Bld: 118 mg/dL — ABNORMAL HIGH (ref 70–99)
Potassium: 4.4 mmol/L (ref 3.5–5.1)
Sodium: 142 mmol/L (ref 135–145)
Total Bilirubin: 0.3 mg/dL (ref 0.3–1.2)
Total Protein: 9 g/dL — ABNORMAL HIGH (ref 6.5–8.1)

## 2020-03-11 LAB — PSA: Prostatic Specific Antigen: <0.01 ng/mL (ref 0.00–4.00)

## 2020-03-13 ENCOUNTER — Inpatient Hospital Stay (HOSPITAL_BASED_OUTPATIENT_CLINIC_OR_DEPARTMENT_OTHER): Payer: Medicare PPO | Admitting: Internal Medicine

## 2020-03-13 ENCOUNTER — Encounter: Payer: Self-pay | Admitting: Internal Medicine

## 2020-03-13 ENCOUNTER — Other Ambulatory Visit: Payer: Self-pay

## 2020-03-13 ENCOUNTER — Inpatient Hospital Stay: Payer: Medicare PPO

## 2020-03-13 VITALS — BP 149/74 | HR 66 | Temp 97.7°F | Resp 16

## 2020-03-13 DIAGNOSIS — Z923 Personal history of irradiation: Secondary | ICD-10-CM | POA: Diagnosis not present

## 2020-03-13 DIAGNOSIS — R5383 Other fatigue: Secondary | ICD-10-CM | POA: Diagnosis not present

## 2020-03-13 DIAGNOSIS — C7951 Secondary malignant neoplasm of bone: Secondary | ICD-10-CM | POA: Diagnosis not present

## 2020-03-13 DIAGNOSIS — C61 Malignant neoplasm of prostate: Secondary | ICD-10-CM

## 2020-03-13 DIAGNOSIS — R634 Abnormal weight loss: Secondary | ICD-10-CM | POA: Diagnosis not present

## 2020-03-13 DIAGNOSIS — D509 Iron deficiency anemia, unspecified: Secondary | ICD-10-CM | POA: Diagnosis not present

## 2020-03-13 DIAGNOSIS — R5381 Other malaise: Secondary | ICD-10-CM | POA: Diagnosis not present

## 2020-03-13 DIAGNOSIS — Z79818 Long term (current) use of other agents affecting estrogen receptors and estrogen levels: Secondary | ICD-10-CM | POA: Diagnosis not present

## 2020-03-13 DIAGNOSIS — I129 Hypertensive chronic kidney disease with stage 1 through stage 4 chronic kidney disease, or unspecified chronic kidney disease: Secondary | ICD-10-CM | POA: Diagnosis not present

## 2020-03-13 MED ORDER — SODIUM CHLORIDE 0.9 % IV SOLN
Freq: Once | INTRAVENOUS | Status: AC
  Filled 2020-03-13: qty 250

## 2020-03-13 MED ORDER — ZOLEDRONIC ACID 4 MG/5ML IV CONC
3.0000 mg | Freq: Once | INTRAVENOUS | Status: AC
  Administered 2020-03-13: 3 mg via INTRAVENOUS
  Filled 2020-03-13: qty 3.75

## 2020-03-13 MED ORDER — EPOETIN ALFA-EPBX 10000 UNIT/ML IJ SOLN
20000.0000 [IU] | Freq: Once | INTRAMUSCULAR | Status: AC
  Administered 2020-03-13: 20000 [IU] via SUBCUTANEOUS
  Filled 2020-03-13: qty 2

## 2020-03-13 NOTE — Progress Notes (Signed)
Pt in for follow up, denies any concerns or difficulties today. 

## 2020-03-13 NOTE — Progress Notes (Signed)
[facility] OFFICE PROGRESS NOTE  Patient Care Team: [name], MD as PCP - General (Family Medicine) [place], Tracie Harrier, MD as Consulting Physician (Ophthalmology) Abbie Sons, MD as Consulting Physician (Urology) Cammie Sickle, MD as Consulting Physician (Hematology and Oncology)  Cancer Staging No matching staging information was found for the patient.   Oncology History Overview Note  # external beam radiation therapy and radiation seed implant in 2005 with Dr. Joelyn Oms and Dr. Danny Lawless for T1c Gleason 3+4 = 7 prostate cancer with a pretreatment PSA of 6.8. In November 2015, he was found to have biochemical recurrence.  Dec 2016- CT a/p & Bone scan- NEG; PSA- 11; June 2017- 19; STARTED on Firmagon [Dr.Budzyn]; on Lupron q [place]  # JAN 2021-PSA rising-3.5.  Bone scan-progression of disease. START X-Tandi 120mg /day; stop in July 2021[[severe weight loss/fatigue]]; started January 17, 2020 80 mg a day   # OCT 2018- Anemia-? Etiology; Likely anemia of chronic disease [anti-CCP elevated/ but NO RA; rheumatology; ]BMBx- hypercellular 30%; relative myeloid hyperplasia/erythroid hypoplasia; FISH- 5/7/8-Normal. II OPINION at [facility]. [facility] H[facility]- NEG  # CKD- III; Bladder neck obstruction s/p Foley [Dr.Stoiff]  # JAN 2018- Dexa scan- N  # NGS-P # PALLIATIVE CARE-P ------------------------------------   DIAGNOSIS: PROSTATE CANCER  STAGE:  IV       ;GOALS: control  CURRENT/MOST RECENT THERAPY: Lupron+ X-tandi    Primary adenocarcinoma of prostate ([place])      INTERVAL HISTORY:  Thomas Lopez 86 y.o.  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  Patient denies any nausea vomiting.  Denies any blood in stools or black-colored stools.c  Denies any worsening pain.   Review of Systems  Constitutional: Positive for malaise/fatigue  and weight loss. Negative for chills, diaphoresis and fever.  HENT: Negative for nosebleeds and sore throat.   Eyes: Negative for double vision.  Respiratory: Negative for cough, hemoptysis, sputum production and wheezing.   Cardiovascular: Negative for chest pain, palpitations, orthopnea and leg swelling.  Gastrointestinal: Negative for abdominal pain, blood in stool, constipation, diarrhea, heartburn, melena, nausea and vomiting.  Genitourinary: Negative for dysuria, frequency and urgency.  Musculoskeletal: Positive for joint pain. Negative for back pain.  Skin: Negative.  Negative for itching and rash.  Neurological: Negative for dizziness, tingling, focal weakness, weakness and headaches.  Endo/Heme/Allergies: Does not bruise/bleed easily.  Psychiatric/Behavioral: Negative for depression. The patient is not nervous/anxious and does not have insomnia.       PAST MEDICAL HISTORY :  Past Medical History:  Diagnosis Date  . Aorto-iliac atherosclerosis ([place]) 04/2015   by CT scan  . Carotid stenosis 11/2014   mild bilateral 1-39%, rpt 2 yrs  . Colon polyps    rpt colonoscopy due 2014  . ED (erectile dysfunction)    Trimix and VED  . Esophagitis   . Essential hypertension 06/29/2009  . GERD (gastroesophageal reflux disease)   . Glaucoma    severe ([place])  . HLD (hyperlipidemia)   . HTN (hypertension)   . Hyperglycemia   . IBS (irritable bowel syndrome)   . Prostate cancer [facility]) 2005   s/p seed implant, EBRT (Dr. Alinda Money at [facility]) recurrent treating with androgen deprivation referred to cancer center  . Smoking history     PAST SURGICAL HISTORY :   Past Surgical History:  Procedure Laterality Date  . CATARACT EXTRACTION Left 05/2017   Dr. Edilia Bo  . CATARACT  EXTRACTION, BILATERAL  December 2016   [place], Dr. Raynelle Fanning  . COLONOSCOPY  03/01/01   Multiple polyps, divertic//adenomatous polyps  . COLONOSCOPY  04/24/01   multiple polyps  . COLONOSCOPY  10/23/01    Polyps benign-repeat every 2 years  . COLONOSCOPY  06/23/04   [place]/hyperplastic polyps; multiple external hemorrhoids  . COLONOSCOPY  06/08/07   single small polyp-repeat 5 years  . CYSTOSCOPY WITH INSERTION OF UROLIFT N/A 04/10/2018   Procedure: CYSTOSCOPY WITH INSERTION OF UROLIFT;  Surgeon: Abbie Sons, MD;  Location: [facility] ORS;  Service: Urology;  Laterality: N/A;  . ESOPHAGOGASTRODUODENOSCOPY  03/01/01   H.H.; gastritis esoph. dudodenitis  . NCS/Wrists Left Carpal  2/02   Min./right improved  . PFTs  10/2012   FVC 64%, FEV1 41%, ratio 0.62  . PROSTATE BIOPSY  12/04   positive; radioactive seed implant (Dr. Joelyn Oms)  . Prostate Ext Beam Radiation  1/27-07/23/03   Dr. Danny Lawless  . SPIROMETRY  09/2011   WNL  . TONSILLECTOMY    . Wrist Release  6/98; 9/99   Right    FAMILY HISTORY :   Family History  Problem Relation Age of Onset  . Cancer Mother        colon  . Hyperlipidemia Mother   . Other Mother        Carotid disease  . Coronary artery disease Neg Hx   . Stroke Neg Hx     SOCIAL HISTORY:   Social History   Tobacco Use  . Smoking status: Former Smoker    Quit date: 05/23/1993    Years since quitting: 26.8  . Smokeless tobacco: Never Used  Vaping Use  . Vaping Use: Never used  Substance Use Topics  . Alcohol use: Yes    Comment: Rare  . Drug use: No    ALLERGIES:  has No Known Allergies.  MEDICATIONS:  Current Outpatient Medications  Medication Sig Dispense Refill  . aspirin EC 81 MG tablet Take 81 mg by mouth daily.    . Cholecalciferol (VITAMIN D3) 25 MCG (1000 UT) CAPS Take 1 capsule (1,000 Units total) by mouth daily. 30 capsule   . dorzolamide-timolol (COSOPT) 22.3-6.8 MG/ML ophthalmic solution Place 1 drop into the left eye 2 (two) times daily.    . enzalutamide (XTANDI) 40 MG capsule Take 2 capsules (80 mg total) by mouth daily. 60 capsule 3  . ferrous sulfate 325 (65 FE) MG tablet Take 1 tablet (325 mg total) by mouth daily with breakfast.  3  .  Leuprolide Acetate, 3 Month, (ELIGARD) 22.5 MG injection Inject 22.5 mg into the skin every 3 (three) months. 1 each 3  . lisinopril (ZESTRIL) 10 MG tablet Take 1 tablet (10 mg total) by mouth daily. 90 tablet 3  . LUMIGAN 0.01 % SOLN     . mirabegron ER (MYRBETRIQ) 25 MG TB24 tablet Take 1 tablet (25 mg total) by mouth daily. 30 tablet 11  . omeprazole (PRILOSEC) 40 MG capsule TAKE 1 CAPSULE EVERY DAY 90 capsule 1  . pilocarpine (PILOCAR) 1 % ophthalmic solution Place 1 drop into the left eye 3 (three) times daily.    . rosuvastatin (CRESTOR) 10 MG tablet TAKE 1 TABLET EVERY DAY 90 tablet 1  . tamsulosin (FLOMAX) 0.4 MG CAPS capsule Take 0.4 mg by mouth. After supper    . timolol (TIMOPTIC) 0.5 % ophthalmic solution Place 1 drop into the left eye 2 (two) times daily.     . trospium ([place])  20 MG tablet TAKE ONE TABLET BY MOUTH EVERY NIGHT AT BEDTIME 30 tablet 0  . venlafaxine XR (EFFEXOR-XR) 37.5 MG 24 hr capsule TAKE 1 CAPSULE EVERY DAY WITH BREAKFAST 90 capsule 1   No current facility-administered medications for this visit.    PHYSICAL EXAMINATION: ECOG PERFORMANCE STATUS: 1 - Symptomatic but completely ambulatory  BP (!) 113/53 (BP Location: Right Arm, Patient Position: Sitting)   Pulse 89   Temp (!) 96.5 F (35.8 C) (Tympanic)   Resp 16   Wt 131 lb 6.4 oz (59.6 kg)   SpO2 100%   BMI 18.85 kg/m   Filed Weights   03/13/20 1314  Weight: 131 lb 6.4 oz (59.6 kg)    Physical Exam Constitutional:      Comments: Thin built cachectic appearing male patient .  He is alone.  Walking by himself.  Appears pale.  HENT:     Head: Normocephalic and atraumatic.     Mouth/Throat:     Pharynx: No oropharyngeal exudate.  Eyes:     Pupils: Pupils are equal, round, and reactive to light.  Cardiovascular:     Rate and Rhythm: Normal rate and regular rhythm.  Pulmonary:     Effort: No respiratory distress.     Breath sounds: No wheezing.  Abdominal:     General: Bowel sounds are  normal. There is no distension.     Palpations: Abdomen is soft. There is no mass.     Tenderness: There is no abdominal tenderness. There is no guarding or rebound.  Musculoskeletal:        General: No tenderness. Normal range of motion.     Cervical back: Normal range of motion and neck supple.  Skin:    General: Skin is warm.  Neurological:     Mental Status: He is alert and oriented to person, place, and time.  Psychiatric:        Mood and Affect: Affect normal.    LABORATORY DATA:  I have reviewed the data as listed    Component Value Date/Time   NA 142 03/11/2020 1104   K 4.4 03/11/2020 1104   CL 108 03/11/2020 1104   CO2 23 03/11/2020 1104   GLUCOSE 118 (H) 03/11/2020 1104   BUN 42 (H) 03/11/2020 1104   CREATININE 1.55 (H) 03/11/2020 1104   CALCIUM 9.6 03/11/2020 1104   PROT 9.0 (H) 03/11/2020 1104   ALBUMIN 3.7 03/11/2020 1104   AST 15 03/11/2020 1104   ALT 9 03/11/2020 1104   ALKPHOS 64 03/11/2020 1104   BILITOT 0.3 03/11/2020 1104   GFRNONAA 40 (L) 03/11/2020 1104   GFRAA 44 (L) 02/12/2020 1319    No results found for: SPEP, UPEP  Lab Results  Component Value Date   WBC 8.0 03/11/2020   NEUTROABS 6.3 03/11/2020   HGB 9.8 (L) 03/11/2020   HCT 32.1 (L) 03/11/2020   MCV 88.9 03/11/2020   PLT 424 (H) 03/11/2020      Chemistry      Component Value Date/Time   NA 142 03/11/2020 1104   K 4.4 03/11/2020 1104   CL 108 03/11/2020 1104   CO2 23 03/11/2020 1104   BUN 42 (H) 03/11/2020 1104   CREATININE 1.55 (H) 03/11/2020 1104      Component Value Date/Time   CALCIUM 9.6 03/11/2020 1104   ALKPHOS 64 03/11/2020 1104   AST 15 03/11/2020 1104   ALT 9 03/11/2020 1104   BILITOT 0.3 03/11/2020 1104  RADIOGRAPHIC STUDIES: I have personally reviewed the radiological images as listed and agreed with the findings in the report. No results found.   ASSESSMENT & PLAN:  Primary adenocarcinoma of prostate ([place]) # Metastatic prostate cancer castrate  resistant- on Lupron 22.5 mg;-bone- scan shows new lesions in right ilaic/ ribs/ calvarium-June 3rd PET- improved bone lesions;  prostate uptake noted. OCT 2021- <0.01; STABLE.   # continune Xtandi 2 pills [weight loss on 3 pills]; monitor closely.   #Hemoglobin -9.8-; ok with retacrit.  September 2021- iron saturation 15 ferritin 33  #CKD stage III -GFR 48.STABLE.  # Bone lesions-castrate resistant prostate cancer; Zometa infusions every 3 months [10/22-ca-9.7]  # DISPOSITION: # RETACRIT; Zometa today # in 4 weeks - possible retacrit; 2-3 days prior labs- cbc/cmp; PSA;-Dr.B- add eligard     Orders Placed This Encounter  Procedures  . CBC with Differential/Platelet    Standing Status:   Future    Standing Expiration Date:   03/13/2021  . Comprehensive metabolic panel    Standing Status:   Future    Standing Expiration Date:   03/13/2021  . PSA    Standing Status:   Future    Standing Expiration Date:   03/13/2021   All questions were answered. The patient knows to call the clinic with any problems, questions or concerns.      Cammie Sickle, MD 03/13/2020 2:22 PM

## 2020-03-13 NOTE — Assessment & Plan Note (Addendum)
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 OCT 2021- <0.01; STABLE.   # continune Xtandi 2 pills [weight loss on 3 pills]; monitor closely.   #Hemoglobin -9.8-; ok with retacrit.  September 2021- iron saturation 15 ferritin 33  #CKD stage III -GFR 48.STABLE.  # Bone lesions-castrate resistant prostate cancer; Zometa infusions every 3 months [10/22-ca-9.7]  # DISPOSITION: # RETACRIT; Zometa today # in 4 weeks - possible retacrit; 2-3 days prior labs- cbc/cmp; PSA;-Dr.B- add eligard

## 2020-03-16 ENCOUNTER — Inpatient Hospital Stay: Payer: Medicare PPO

## 2020-03-18 ENCOUNTER — Other Ambulatory Visit: Payer: Self-pay | Admitting: Family Medicine

## 2020-03-19 NOTE — Telephone Encounter (Signed)
E-scribed refill.  Plz schedule wellness, cpe and lab visits.  

## 2020-03-24 MED FILL — XTANDI 40 MG CAPSULE: 40 | 30 days supply | Qty: 60 | Fill #2

## 2020-03-25 ENCOUNTER — Ambulatory Visit (INDEPENDENT_AMBULATORY_CARE_PROVIDER_SITE_OTHER): Payer: Medicare PPO

## 2020-03-25 ENCOUNTER — Other Ambulatory Visit: Payer: Self-pay

## 2020-03-25 DIAGNOSIS — Z23 Encounter for immunization: Secondary | ICD-10-CM | POA: Diagnosis not present

## 2020-03-28 IMAGING — CR DG KNEE COMPLETE 4+V*R*
4 series · 4 of 4 positions shown · non-contrast
Comparison: None.

CLINICAL DATA: Pain and limited range of motion

EXAM:
RIGHT KNEE - COMPLETE 4+ VIEW

[knee ap]
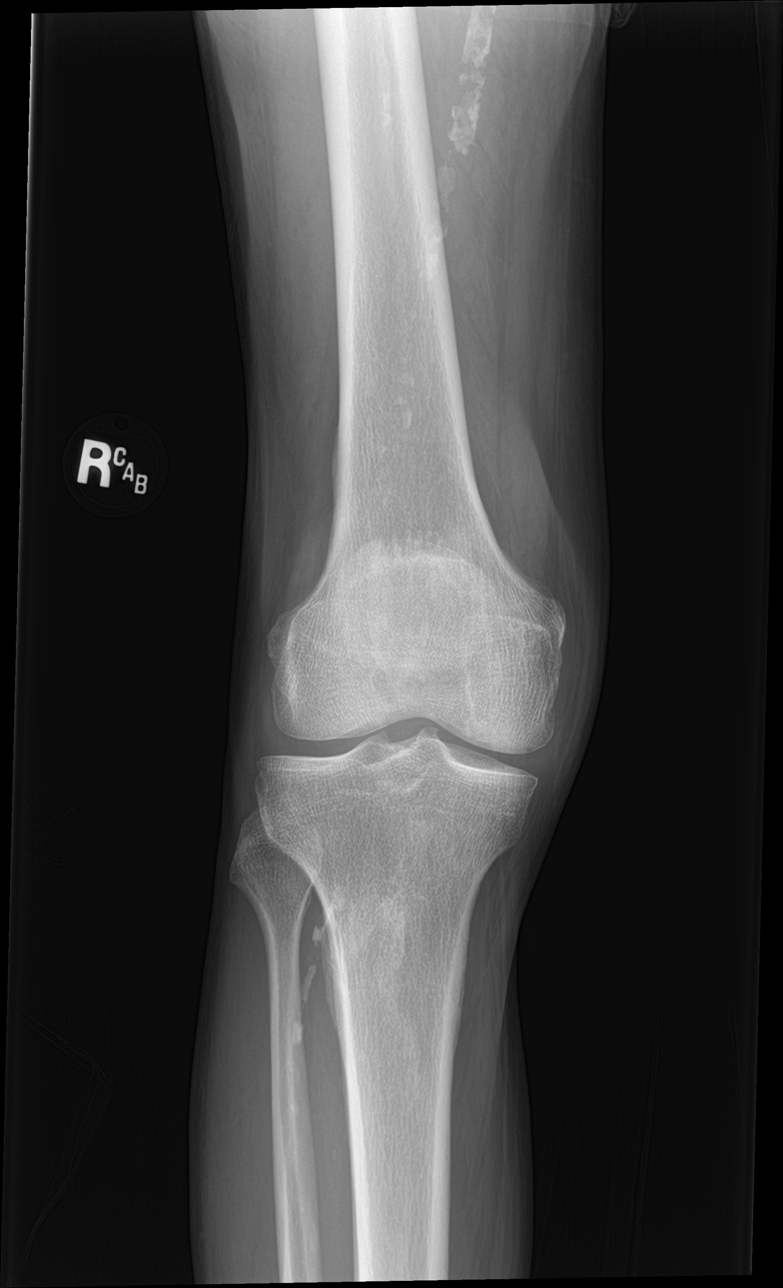

[knee obl (1 of 2)]
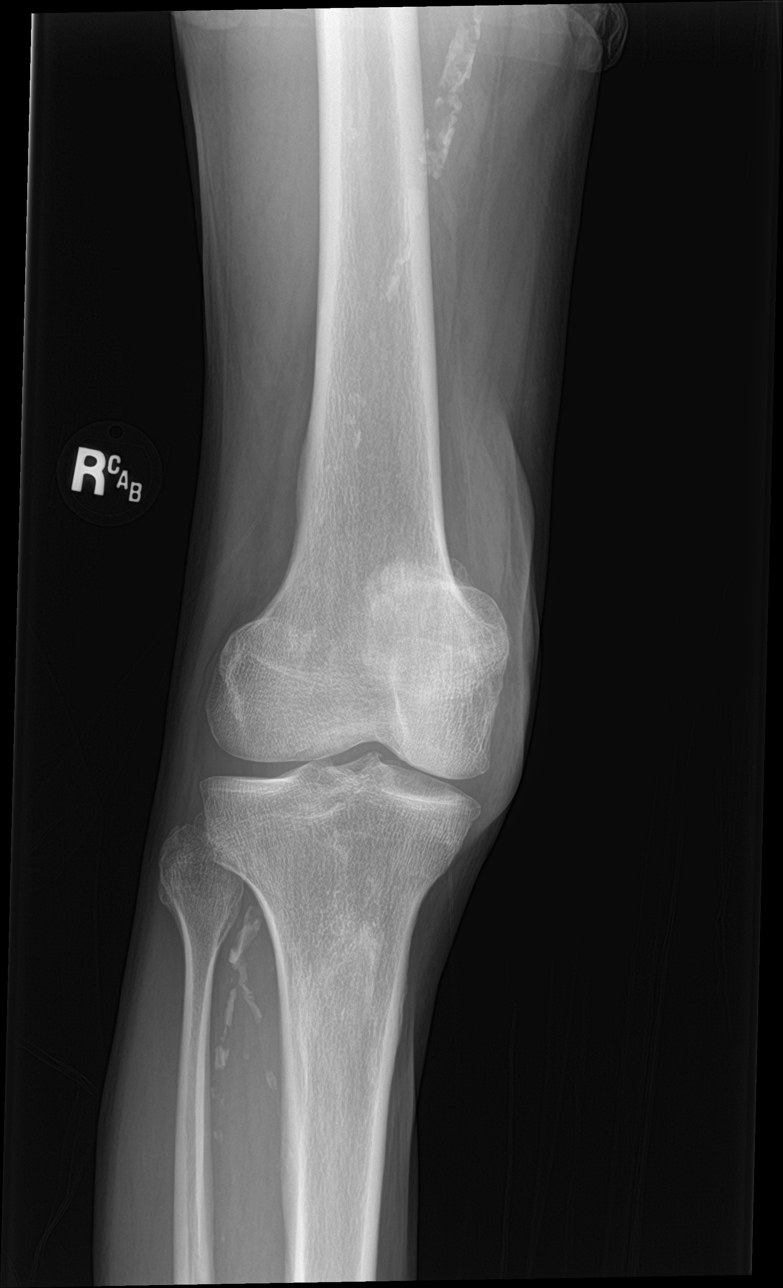

[knee obl (2 of 2)]
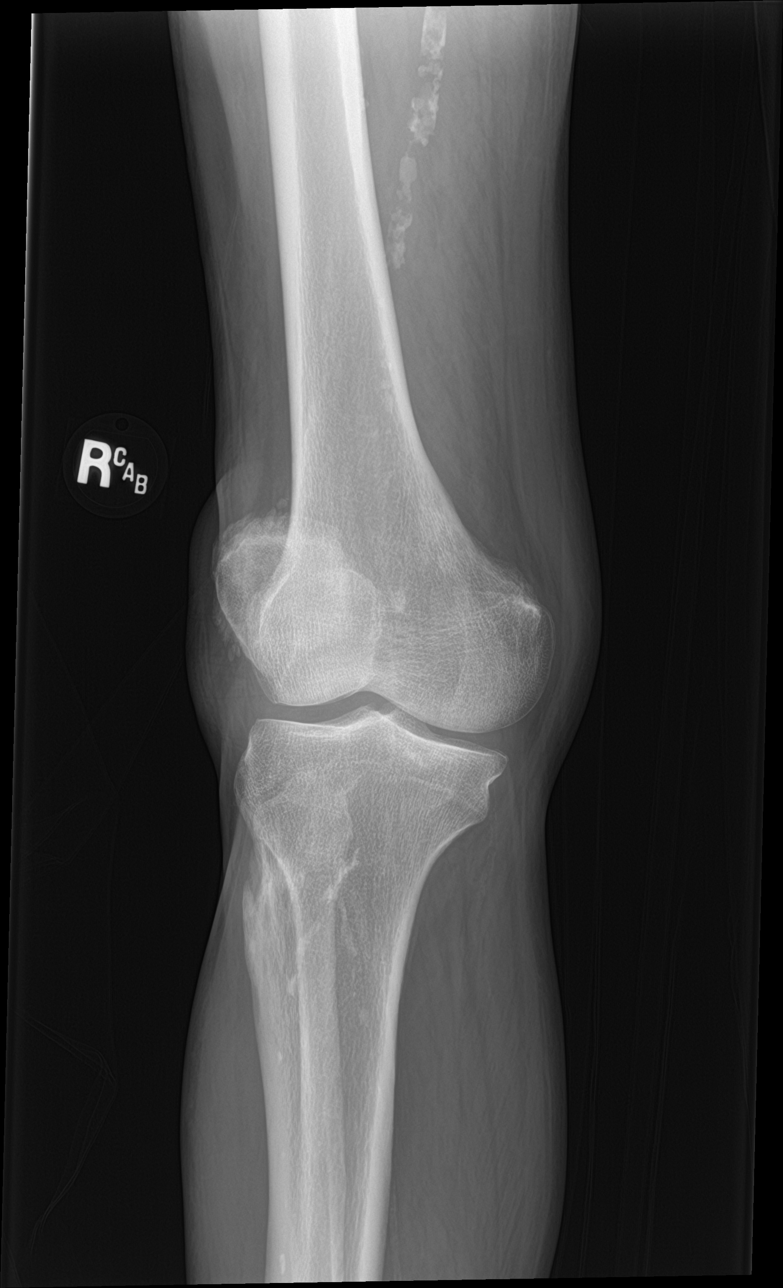

[knee lat]
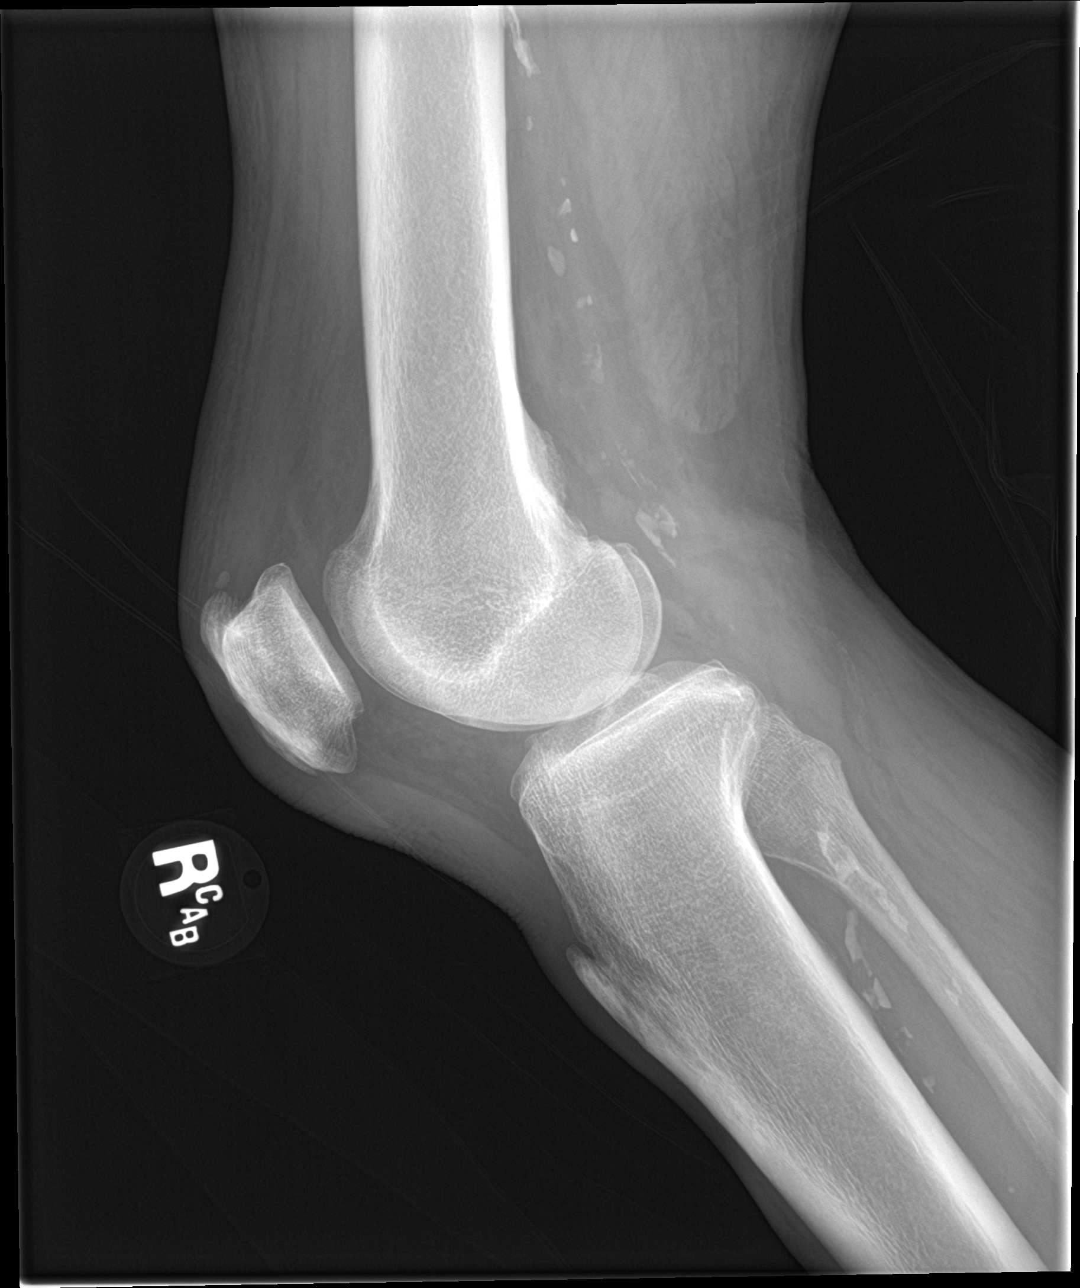

[4 of 4 positions shown; findings below may reference images not displayed]

FINDINGS: Frontal, lateral common bile oblique views were obtained. There is
no appreciable fracture or dislocation. No appreciable joint
effusion. Joint spaces appear unremarkable. There is spurring along
the anterior patella superiorly and inferiorly. No erosive changes.
There is extensive arterial vascular calcification.
IMPRESSION: Spurring along the patella anteriorly, likely due to distal
quadriceps and proximal patellar tendinosis. No appreciable joint
space narrowing or joint effusion. No fracture or dislocation.
Multivessel atherosclerotic calcification noted.

## 2020-04-01 ENCOUNTER — Other Ambulatory Visit: Payer: Self-pay | Admitting: Family Medicine

## 2020-04-01 DIAGNOSIS — C61 Malignant neoplasm of prostate: Secondary | ICD-10-CM

## 2020-04-09 ENCOUNTER — Inpatient Hospital Stay: Payer: Medicare PPO | Attending: Internal Medicine

## 2020-04-09 ENCOUNTER — Other Ambulatory Visit: Payer: Self-pay

## 2020-04-09 DIAGNOSIS — I129 Hypertensive chronic kidney disease with stage 1 through stage 4 chronic kidney disease, or unspecified chronic kidney disease: Secondary | ICD-10-CM | POA: Insufficient documentation

## 2020-04-09 DIAGNOSIS — E785 Hyperlipidemia, unspecified: Secondary | ICD-10-CM | POA: Diagnosis not present

## 2020-04-09 DIAGNOSIS — C61 Malignant neoplasm of prostate: Secondary | ICD-10-CM | POA: Diagnosis not present

## 2020-04-09 DIAGNOSIS — N183 Chronic kidney disease, stage 3 unspecified: Secondary | ICD-10-CM | POA: Diagnosis not present

## 2020-04-09 DIAGNOSIS — D631 Anemia in chronic kidney disease: Secondary | ICD-10-CM | POA: Diagnosis not present

## 2020-04-09 DIAGNOSIS — K219 Gastro-esophageal reflux disease without esophagitis: Secondary | ICD-10-CM | POA: Diagnosis not present

## 2020-04-09 DIAGNOSIS — Z87891 Personal history of nicotine dependence: Secondary | ICD-10-CM | POA: Insufficient documentation

## 2020-04-09 DIAGNOSIS — C7951 Secondary malignant neoplasm of bone: Secondary | ICD-10-CM | POA: Insufficient documentation

## 2020-04-09 DIAGNOSIS — Z79818 Long term (current) use of other agents affecting estrogen receptors and estrogen levels: Secondary | ICD-10-CM | POA: Insufficient documentation

## 2020-04-09 LAB — COMPREHENSIVE METABOLIC PANEL
ALT: 8 U/L (ref 0–44)
AST: 13 U/L — ABNORMAL LOW (ref 15–41)
Albumin: 3.6 g/dL (ref 3.5–5.0)
Alkaline Phosphatase: 53 U/L (ref 38–126)
Anion gap: 9 (ref 5–15)
BUN: 39 mg/dL — ABNORMAL HIGH (ref 8–23)
CO2: 23 mmol/L (ref 22–32)
Calcium: 9.2 mg/dL (ref 8.9–10.3)
Chloride: 106 mmol/L (ref 98–111)
Creatinine, Ser: 1.33 mg/dL — ABNORMAL HIGH (ref 0.61–1.24)
GFR, Estimated: 52 mL/min — ABNORMAL LOW (ref >60)
Glucose, Bld: 120 mg/dL — ABNORMAL HIGH (ref 70–99)
Potassium: 3.8 mmol/L (ref 3.5–5.1)
Sodium: 138 mmol/L (ref 135–145)
Total Bilirubin: 0.3 mg/dL (ref 0.3–1.2)
Total Protein: 7.7 g/dL (ref 6.5–8.1)

## 2020-04-09 LAB — CBC WITH DIFFERENTIAL/PLATELET
Abs Immature Granulocytes: 0.02 10*3/uL (ref 0.00–0.07)
Basophils Absolute: 0 10*3/uL (ref 0.0–0.1)
Basophils Relative: 0 %
Eosinophils Absolute: 0 10*3/uL (ref 0.0–0.5)
Eosinophils Relative: 0 %
HCT: 32 % — ABNORMAL LOW (ref 39.0–52.0)
Hemoglobin: 9.9 g/dL — ABNORMAL LOW (ref 13.0–17.0)
Immature Granulocytes: 0 %
Lymphocytes Relative: 11 %
Lymphs Abs: 0.8 10*3/uL (ref 0.7–4.0)
MCH: 27 pg (ref 26.0–34.0)
MCHC: 30.9 g/dL (ref 30.0–36.0)
MCV: 87.4 fL (ref 80.0–100.0)
Monocytes Absolute: 0.4 10*3/uL (ref 0.1–1.0)
Monocytes Relative: 5 %
Neutro Abs: 6.1 10*3/uL (ref 1.7–7.7)
Neutrophils Relative %: 84 %
Platelets: 301 10*3/uL (ref 150–400)
RBC: 3.66 MIL/uL — ABNORMAL LOW (ref 4.22–5.81)
RDW: 16.9 % — ABNORMAL HIGH (ref 11.5–15.5)
WBC: 7.3 10*3/uL (ref 4.0–10.5)
nRBC: 0 % (ref 0.0–0.2)

## 2020-04-10 ENCOUNTER — Inpatient Hospital Stay: Payer: Medicare PPO

## 2020-04-10 ENCOUNTER — Encounter: Payer: Self-pay | Admitting: Internal Medicine

## 2020-04-10 ENCOUNTER — Other Ambulatory Visit: Payer: Self-pay

## 2020-04-10 ENCOUNTER — Inpatient Hospital Stay (HOSPITAL_BASED_OUTPATIENT_CLINIC_OR_DEPARTMENT_OTHER): Payer: Medicare PPO | Admitting: Internal Medicine

## 2020-04-10 DIAGNOSIS — C61 Malignant neoplasm of prostate: Secondary | ICD-10-CM

## 2020-04-10 DIAGNOSIS — Z79818 Long term (current) use of other agents affecting estrogen receptors and estrogen levels: Secondary | ICD-10-CM | POA: Diagnosis not present

## 2020-04-10 DIAGNOSIS — D631 Anemia in chronic kidney disease: Secondary | ICD-10-CM | POA: Diagnosis not present

## 2020-04-10 DIAGNOSIS — N183 Chronic kidney disease, stage 3 unspecified: Secondary | ICD-10-CM | POA: Diagnosis not present

## 2020-04-10 DIAGNOSIS — K219 Gastro-esophageal reflux disease without esophagitis: Secondary | ICD-10-CM | POA: Diagnosis not present

## 2020-04-10 DIAGNOSIS — I129 Hypertensive chronic kidney disease with stage 1 through stage 4 chronic kidney disease, or unspecified chronic kidney disease: Secondary | ICD-10-CM | POA: Diagnosis not present

## 2020-04-10 DIAGNOSIS — C7951 Secondary malignant neoplasm of bone: Secondary | ICD-10-CM | POA: Diagnosis not present

## 2020-04-10 DIAGNOSIS — E785 Hyperlipidemia, unspecified: Secondary | ICD-10-CM | POA: Diagnosis not present

## 2020-04-10 DIAGNOSIS — Z87891 Personal history of nicotine dependence: Secondary | ICD-10-CM | POA: Diagnosis not present

## 2020-04-10 LAB — PSA, TOTAL AND FREE
PSA, Free Pct: >10 %
PSA, Free: 0.01 ng/mL
Prostate Specific Ag, Serum: <0.1 ng/mL

## 2020-04-10 MED ORDER — LEUPROLIDE ACETATE (3 MONTH) 22.5 MG ~~LOC~~ KIT
22.5000 mg | PACK | Freq: Once | SUBCUTANEOUS | Status: AC
  Administered 2020-04-10: 22.5 mg via SUBCUTANEOUS
  Filled 2020-04-10: qty 22.5

## 2020-04-10 MED ORDER — EPOETIN ALFA-EPBX 10000 UNIT/ML IJ SOLN
20000.0000 [IU] | Freq: Once | INTRAMUSCULAR | Status: AC
  Administered 2020-04-10: 20000 [IU] via SUBCUTANEOUS
  Filled 2020-04-10: qty 2

## 2020-04-10 MED ORDER — PREDNISONE 10 MG PO TABS: 10.0000 mg | ORAL_TABLET | Freq: Every day | ORAL | 1 refills | Status: AC

## 2020-04-10 NOTE — Progress Notes (Signed)
Pt in for follow up, denies any concerns today. 

## 2020-04-10 NOTE — Assessment & Plan Note (Addendum)
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 OCT 2021- <0.01; STABLE.   # continune Xtandi 2 pills [weight loss on 3 pills]; monitor closely.   #Hemoglobin -9.9-; ok with retacrit.  September 2021- iron saturation 15 ferritin 33; PO iron.   #CKD stage III -GFR 52.STABLE.  # weight loss/poor apetite- recommend prednisone 10 mg/day.   # Bone lesions-castrate resistant prostate cancer; Zometa infusions every 3 months [10/22-ca-9.7]  # DISPOSITION: # RETACRIT; eligard # in 4 weeks - possible retacrit; 2-3 days prior labs- cbc/cmp; PSA;-Dr.B

## 2020-04-10 NOTE — Progress Notes (Signed)
[place] OFFICE PROGRESS NOTE  Patient Care Team: [name], MD as PCP - General (Family Medicine) Bond, Tracie Harrier, MD as Consulting Physician (Ophthalmology) Abbie Sons, MD as Consulting Physician (Urology) Cammie Sickle, MD as Consulting Physician (Hematology and Oncology)  Cancer Staging No matching staging information was found for the patient.   Oncology History Overview Note  # external beam radiation therapy and radiation seed implant in 2005 with Dr. Joelyn Oms and Dr. Danny Lawless for T1c Gleason 3+4 = 7 prostate cancer with a pretreatment PSA of 6.8. In November 2015, he was found to have biochemical recurrence.  Dec 2016- CT a/p & Bone scan- NEG; PSA- 11; June 2017- 19; STARTED on Firmagon [Dr.Budzyn]; on Lupron q [place]  # JAN 2021-PSA rising-3.5.  Bone scan-progression of disease. START X-Tandi 120mg /day; stop in July 2021[[severe weight loss/fatigue]]; started January 17, 2020 80 mg a day   # OCT 2018- Anemia-? Etiology; Likely anemia of chronic disease [anti-CCP elevated/ but NO RA; rheumatology; ]BMBx- hypercellular 30%; relative myeloid hyperplasia/erythroid hypoplasia; FISH- 5/7/8-Normal. II OPINION at [facility]- NEG  # CKD- III; Bladder neck obstruction s/p Foley [Dr.Stoiff]  # JAN 2018- Dexa scan- N  # NGS-P # PALLIATIVE CARE-P ------------------------------------   DIAGNOSIS: PROSTATE CANCER  STAGE:  IV       ;GOALS: control  CURRENT/MOST RECENT THERAPY: Lupron+ X-tandi    Primary adenocarcinoma of prostate ([place])    INTERVAL HISTORY:  Thomas Lopez 86 y.o.  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  Patient denies any nausea vomiting.  Denies any black-colored stool or blood in stool.  He denies any chest pain.  No shortness of breath or cough.  Mild swelling in the legs.  Concerned about  continued weight loss.  Poor appetite.  Review of Systems  Constitutional: Positive for malaise/fatigue and weight loss. Negative for chills, diaphoresis and fever.  HENT: Negative for nosebleeds and sore throat.   Eyes: Negative for double vision.  Respiratory: Negative for cough, hemoptysis, sputum production and wheezing.   Cardiovascular: Negative for chest pain, palpitations, orthopnea and leg swelling.  Gastrointestinal: Negative for abdominal pain, blood in stool, constipation, diarrhea, heartburn, melena, nausea and vomiting.  Genitourinary: Negative for dysuria, frequency and urgency.  Musculoskeletal: Positive for joint pain. Negative for back pain.  Skin: Negative.  Negative for itching and rash.  Neurological: Negative for dizziness, tingling, focal weakness, weakness and headaches.  Endo/Heme/Allergies: Does not bruise/bleed easily.  Psychiatric/Behavioral: Negative for depression. The patient is not nervous/anxious and does not have insomnia.       PAST MEDICAL HISTORY :  Past Medical History:  Diagnosis Date  . Aorto-iliac atherosclerosis ([place]) 04/2015   by CT scan  . Carotid stenosis 11/2014   mild bilateral 1-39%, rpt 2 yrs  . Colon polyps    rpt colonoscopy due 2014  . ED (erectile dysfunction)    Trimix and VED  . Esophagitis   . Essential hypertension 06/29/2009  . GERD (gastroesophageal reflux disease)   . Glaucoma    severe ([place])  . HLD (hyperlipidemia)   . HTN (hypertension)   . Hyperglycemia   . IBS (irritable bowel syndrome)   . Prostate cancer [facility]) 2005   s/p seed implant, EBRT (Dr. Alinda [facility] at [facility]) recurrent treating with androgen deprivation referred to cancer center  . Smoking history     PAST SURGICAL HISTORY :  Past Surgical History:  Procedure Laterality Date  . CATARACT EXTRACTION Left 05/2017   Dr. Edilia Bo  . CATARACT EXTRACTION, BILATERAL  December 2016   [place], Dr. Raynelle Fanning  . COLONOSCOPY  03/01/01   Multiple  polyps, divertic//adenomatous polyps  . COLONOSCOPY  04/24/01   multiple polyps  . COLONOSCOPY  10/23/01   Polyps benign-repeat every 2 years  . COLONOSCOPY  06/23/04   Adenom/hyperplastic polyps; multiple external hemorrhoids  . COLONOSCOPY  06/08/07   single small polyp-repeat 5 years  . CYSTOSCOPY WITH INSERTION OF UROLIFT N/A 04/10/2018   Procedure: CYSTOSCOPY WITH INSERTION OF UROLIFT;  Surgeon: Abbie Sons, MD;  Location: [facility] ORS;  Service: Urology;  Laterality: N/A;  . ESOPHAGOGASTRODUODENOSCOPY  03/01/01   H.H.; gastritis esoph. dudodenitis  . NCS/Wrists Left Carpal  2/02   Min./right improved  . PFTs  10/2012   FVC 64%, FEV1 41%, ratio 0.62  . PROSTATE BIOPSY  12/04   positive; radioactive seed implant (Dr. Joelyn Oms)  . Prostate Ext Beam Radiation  1/27-07/23/03   Dr. Danny Lawless  . SPIROMETRY  09/2011   WNL  . TONSILLECTOMY    . Wrist Release  6/98; 9/99   Right    FAMILY HISTORY :   Family History  Problem Relation Age of Onset  . Cancer Mother        colon  . Hyperlipidemia Mother   . Other Mother        Carotid disease  . Coronary artery disease Neg Hx   . Stroke Neg Hx     SOCIAL HISTORY:   Social History   Tobacco Use  . Smoking status: Former Smoker    Quit date: 05/23/1993    Years since quitting: 26.9  . Smokeless tobacco: Never Used  Vaping Use  . Vaping Use: Never used  Substance Use Topics  . Alcohol use: Yes    Comment: Rare  . Drug use: No    ALLERGIES:  has No Known Allergies.  MEDICATIONS:  Current Outpatient Medications  Medication Sig Dispense Refill  . aspirin EC 81 MG tablet Take 81 mg by mouth daily.    . Cholecalciferol (VITAMIN D3) 25 MCG (1000 UT) CAPS Take 1 capsule (1,000 Units total) by mouth daily. 30 capsule   . dorzolamide-timolol (COSOPT) 22.3-6.8 MG/ML ophthalmic solution Place 1 drop into the left eye 2 (two) times daily.    . enzalutamide (XTANDI) 40 MG capsule Take 2 capsules (80 mg total) by mouth daily. 60 capsule 3   . ferrous sulfate 325 (65 FE) MG tablet Take 1 tablet (325 mg total) by mouth daily with breakfast.  3  . Leuprolide Acetate, 3 Month, (ELIGARD) 22.5 MG injection Inject 22.5 mg into the skin every 3 (three) months. 1 each 3  . LUMIGAN 0.01 % SOLN     . mirabegron ER (MYRBETRIQ) 25 MG TB24 tablet Take 1 tablet (25 mg total) by mouth daily. 30 tablet 11  . omeprazole (PRILOSEC) 40 MG capsule TAKE 1 CAPSULE EVERY DAY 90 capsule 0  . pilocarpine (PILOCAR) 1 % ophthalmic solution Place 1 drop into the left eye 3 (three) times daily.    . rosuvastatin (CRESTOR) 10 MG tablet TAKE 1 TABLET EVERY DAY 90 tablet 0  . tamsulosin (FLOMAX) 0.4 MG CAPS capsule Take 0.4 mg by mouth. After supper    . timolol (TIMOPTIC) 0.5 % ophthalmic solution Place 1 drop into the left eye 2 (two) times daily.     Marland Kitchen venlafaxine  XR (EFFEXOR-XR) 37.5 MG 24 hr capsule TAKE 1 CAPSULE EVERY DAY WITH BREAKFAST 90 capsule 0  . lisinopril (ZESTRIL) 10 MG tablet TAKE 1 TABLET BY MOUTH DAILY (NEW DOSE) 90 tablet 0  . predniSONE (DELTASONE) 10 MG tablet Take 1 tablet (10 mg total) by mouth daily with breakfast. 30 tablet 1  . trospium (SANCTURA) 20 MG tablet TAKE ONE TABLET BY MOUTH EVERY NIGHT AT BEDTIME (Patient not taking: Reported on 04/10/2020) 30 tablet 0   No current facility-administered medications for this visit.    PHYSICAL EXAMINATION: ECOG PERFORMANCE STATUS: 1 - Symptomatic but completely ambulatory  BP 126/63 (BP Location: Left Arm, Patient Position: Sitting)   Pulse 74   Temp (!) 96.9 F (36.1 C) (Tympanic)   Resp 18   Wt 133 lb (60.3 kg)   SpO2 100%   BMI 19.08 kg/m   Filed Weights   04/10/20 1447  Weight: 133 lb (60.3 kg)    Physical Exam Constitutional:      Comments: Thin built cachectic appearing male patient .  He is accompanied by his wife.  Walking by himself.    HENT:     Head: Normocephalic and atraumatic.     Mouth/Throat:     Pharynx: No oropharyngeal exudate.  Eyes:     Pupils:  Pupils are equal, round, and reactive to light.  Cardiovascular:     Rate and Rhythm: Normal rate and regular rhythm.  Pulmonary:     Effort: No respiratory distress.     Breath sounds: No wheezing.     Comments: Decreased air entry bilaterally.  No wheeze or crackles. Abdominal:     General: Bowel sounds are normal. There is no distension.     Palpations: Abdomen is soft. There is no mass.     Tenderness: There is no abdominal tenderness. There is no guarding or rebound.  Musculoskeletal:        General: No tenderness. Normal range of motion.     Cervical back: Normal range of motion and neck supple.  Skin:    General: Skin is warm.  Neurological:     Mental Status: He is alert and oriented to person, place, and time.  Psychiatric:        Mood and Affect: Affect normal.    LABORATORY DATA:  I have reviewed the data as listed    Component Value Date/Time   NA 138 04/09/2020 1128   K 3.8 04/09/2020 1128   CL 106 04/09/2020 1128   CO2 23 04/09/2020 1128   GLUCOSE 120 (H) 04/09/2020 1128   BUN 39 (H) 04/09/2020 1128   CREATININE 1.33 (H) 04/09/2020 1128   CALCIUM 9.2 04/09/2020 1128   PROT 7.7 04/09/2020 1128   ALBUMIN 3.6 04/09/2020 1128   AST 13 (L) 04/09/2020 1128   ALT 8 04/09/2020 1128   ALKPHOS 53 04/09/2020 1128   BILITOT 0.3 04/09/2020 1128   GFRNONAA 52 (L) 04/09/2020 1128   GFRAA 44 (L) 02/12/2020 1319    No results found for: SPEP, UPEP  Lab Results  Component Value Date   WBC 7.3 04/09/2020   NEUTROABS 6.1 04/09/2020   HGB 9.9 (L) 04/09/2020   HCT 32.0 (L) 04/09/2020   MCV 87.4 04/09/2020   PLT 301 04/09/2020      Chemistry      Component Value Date/Time   NA 138 04/09/2020 1128   K 3.8 04/09/2020 1128   CL 106 04/09/2020 1128   CO2 23 04/09/2020 1128   BUN  39 (H) 04/09/2020 1128   CREATININE 1.33 (H) 04/09/2020 1128      Component Value Date/Time   CALCIUM 9.2 04/09/2020 1128   ALKPHOS 53 04/09/2020 1128   AST 13 (L) 04/09/2020 1128    ALT 8 04/09/2020 1128   BILITOT 0.3 04/09/2020 1128       RADIOGRAPHIC STUDIES: I have personally reviewed the radiological images as listed and agreed with the findings in the report. No results found.   ASSESSMENT & PLAN:  Primary adenocarcinoma of prostate ([place]) # Metastatic prostate cancer castrate resistant- on Lupron 22.5 mg;-bone- scan shows new lesions in right ilaic/ ribs/ calvarium-June 3rd PET- improved bone lesions;  prostate uptake noted. OCT 2021- <0.01; STABLE.   # continune Xtandi 2 pills [weight loss on 3 pills]; monitor closely.   #Hemoglobin -9.9-; ok with retacrit.  September 2021- iron saturation 15 ferritin 33; PO iron.   #CKD stage III -GFR 52.STABLE.  # weight loss/poor apetite- recommend prednisone 10 mg/day.   # Bone lesions-castrate resistant prostate cancer; Zometa infusions every 3 months [10/22-ca-9.7]  # DISPOSITION: # RETACRIT; eligard # in 4 weeks - possible retacrit; 2-3 days prior labs- cbc/cmp; PSA;-Dr.B     No orders of the defined types were placed in this encounter.  All questions were answered. The patient knows to call the clinic with any problems, questions or concerns.      Cammie Sickle, MD 04/12/2020 10:02 AM

## 2020-04-13 ENCOUNTER — Inpatient Hospital Stay: Payer: Medicare PPO

## 2020-04-13 NOTE — Progress Notes (Signed)
Nutrition Follow-up:  Patient with metastatic prostate cancer to bone.  Patient on xtandi.    Met with patient in clinic this am.  Patient reports that his appetite is "great".  Denies nausea. Reports bowels are moving normally.  Reports that he is drinking 1 boost per day.  Reports that this am ate 2 eggs, 3 strips of bacon, 2 pieces of toast and juice and coffee.  Lunch is usually some type of sandwich and drink (no soda).  Supper last night was 2 chicken legs and 1 thigh, mashed potatoes and something else.  Reports that usually around 8-9 gets hungry but does not get something to eat because has never been a snacker.      Medications: reviewed  Labs: reviewed  Anthropometrics:   Weight 135 lb today in clinic stable from 136 lb on 9/20, last seen by RD.  Noted lower weights of 133 lb recently   NUTRITION DIAGNOSIS: Unintentional weight loss improved   INTERVENTION:  Asked patient to increase to drinking boost plus BID.  Patient agreed. Encouraged patient to have bedtime snack and to not eat when body is hungry.   Contact information given to patient    MONITORING, EVALUATION, GOAL: weight loss, intake   NEXT VISIT: Jan 3 in clinic  [place]. Zenia Resides, [place], [place] Registered Dietitian [phone] (mobile)

## 2020-04-20 MED FILL — XTANDI 40 MG CAPSULE: 40 | 30 days supply | Qty: 60 | Fill #3

## 2020-05-06 ENCOUNTER — Inpatient Hospital Stay: Payer: Medicare PPO | Attending: Internal Medicine

## 2020-05-06 DIAGNOSIS — I129 Hypertensive chronic kidney disease with stage 1 through stage 4 chronic kidney disease, or unspecified chronic kidney disease: Secondary | ICD-10-CM | POA: Insufficient documentation

## 2020-05-06 DIAGNOSIS — E785 Hyperlipidemia, unspecified: Secondary | ICD-10-CM | POA: Diagnosis not present

## 2020-05-06 DIAGNOSIS — Z87891 Personal history of nicotine dependence: Secondary | ICD-10-CM | POA: Insufficient documentation

## 2020-05-06 DIAGNOSIS — Z7982 Long term (current) use of aspirin: Secondary | ICD-10-CM | POA: Insufficient documentation

## 2020-05-06 DIAGNOSIS — K589 Irritable bowel syndrome without diarrhea: Secondary | ICD-10-CM | POA: Insufficient documentation

## 2020-05-06 DIAGNOSIS — R5383 Other fatigue: Secondary | ICD-10-CM | POA: Insufficient documentation

## 2020-05-06 DIAGNOSIS — Z79818 Long term (current) use of other agents affecting estrogen receptors and estrogen levels: Secondary | ICD-10-CM | POA: Diagnosis not present

## 2020-05-06 DIAGNOSIS — Z923 Personal history of irradiation: Secondary | ICD-10-CM | POA: Insufficient documentation

## 2020-05-06 DIAGNOSIS — R5381 Other malaise: Secondary | ICD-10-CM | POA: Insufficient documentation

## 2020-05-06 DIAGNOSIS — N183 Chronic kidney disease, stage 3 unspecified: Secondary | ICD-10-CM | POA: Diagnosis not present

## 2020-05-06 DIAGNOSIS — C7951 Secondary malignant neoplasm of bone: Secondary | ICD-10-CM | POA: Diagnosis not present

## 2020-05-06 DIAGNOSIS — D631 Anemia in chronic kidney disease: Secondary | ICD-10-CM | POA: Insufficient documentation

## 2020-05-06 DIAGNOSIS — C61 Malignant neoplasm of prostate: Secondary | ICD-10-CM | POA: Insufficient documentation

## 2020-05-06 DIAGNOSIS — Z79899 Other long term (current) drug therapy: Secondary | ICD-10-CM | POA: Insufficient documentation

## 2020-05-06 DIAGNOSIS — Z7952 Long term (current) use of systemic steroids: Secondary | ICD-10-CM | POA: Insufficient documentation

## 2020-05-06 LAB — CBC WITH DIFFERENTIAL/PLATELET
Abs Immature Granulocytes: 0.02 10*3/uL (ref 0.00–0.07)
Basophils Absolute: 0 10*3/uL (ref 0.0–0.1)
Basophils Relative: 0 %
Eosinophils Absolute: 0 10*3/uL (ref 0.0–0.5)
Eosinophils Relative: 0 %
HCT: 34.6 % — ABNORMAL LOW (ref 39.0–52.0)
Hemoglobin: 10.5 g/dL — ABNORMAL LOW (ref 13.0–17.0)
Immature Granulocytes: 0 %
Lymphocytes Relative: 12 %
Lymphs Abs: 0.8 10*3/uL (ref 0.7–4.0)
MCH: 26.6 pg (ref 26.0–34.0)
MCHC: 30.3 g/dL (ref 30.0–36.0)
MCV: 87.6 fL (ref 80.0–100.0)
Monocytes Absolute: 0.4 10*3/uL (ref 0.1–1.0)
Monocytes Relative: 6 %
Neutro Abs: 5.6 10*3/uL (ref 1.7–7.7)
Neutrophils Relative %: 82 %
Platelets: 269 10*3/uL (ref 150–400)
RBC: 3.95 MIL/uL — ABNORMAL LOW (ref 4.22–5.81)
RDW: 17.2 % — ABNORMAL HIGH (ref 11.5–15.5)
WBC: 6.9 10*3/uL (ref 4.0–10.5)
nRBC: 0 % (ref 0.0–0.2)

## 2020-05-06 LAB — PSA: Prostatic Specific Antigen: 0.04 ng/mL (ref 0.00–4.00)

## 2020-05-06 LAB — COMPREHENSIVE METABOLIC PANEL
ALT: 10 U/L (ref 0–44)
AST: 15 U/L (ref 15–41)
Albumin: 3.9 g/dL (ref 3.5–5.0)
Alkaline Phosphatase: 47 U/L (ref 38–126)
Anion gap: 11 (ref 5–15)
BUN: 22 mg/dL (ref 8–23)
CO2: 26 mmol/L (ref 22–32)
Calcium: 9.2 mg/dL (ref 8.9–10.3)
Chloride: 106 mmol/L (ref 98–111)
Creatinine, Ser: 1.07 mg/dL (ref 0.61–1.24)
GFR, Estimated: >60 mL/min (ref >60)
Glucose, Bld: 112 mg/dL — ABNORMAL HIGH (ref 70–99)
Potassium: 4.3 mmol/L (ref 3.5–5.1)
Sodium: 143 mmol/L (ref 135–145)
Total Bilirubin: 0.4 mg/dL (ref 0.3–1.2)
Total Protein: 8 g/dL (ref 6.5–8.1)

## 2020-05-08 ENCOUNTER — Inpatient Hospital Stay (HOSPITAL_BASED_OUTPATIENT_CLINIC_OR_DEPARTMENT_OTHER): Payer: Medicare PPO | Admitting: Internal Medicine

## 2020-05-08 ENCOUNTER — Encounter: Payer: Self-pay | Admitting: Internal Medicine

## 2020-05-08 ENCOUNTER — Inpatient Hospital Stay: Payer: Medicare PPO

## 2020-05-08 DIAGNOSIS — C7951 Secondary malignant neoplasm of bone: Secondary | ICD-10-CM | POA: Diagnosis not present

## 2020-05-08 DIAGNOSIS — R5381 Other malaise: Secondary | ICD-10-CM | POA: Diagnosis not present

## 2020-05-08 DIAGNOSIS — C61 Malignant neoplasm of prostate: Secondary | ICD-10-CM

## 2020-05-08 DIAGNOSIS — Z923 Personal history of irradiation: Secondary | ICD-10-CM | POA: Diagnosis not present

## 2020-05-08 DIAGNOSIS — D631 Anemia in chronic kidney disease: Secondary | ICD-10-CM | POA: Diagnosis not present

## 2020-05-08 DIAGNOSIS — R5383 Other fatigue: Secondary | ICD-10-CM | POA: Diagnosis not present

## 2020-05-08 DIAGNOSIS — Z79818 Long term (current) use of other agents affecting estrogen receptors and estrogen levels: Secondary | ICD-10-CM | POA: Diagnosis not present

## 2020-05-08 DIAGNOSIS — I129 Hypertensive chronic kidney disease with stage 1 through stage 4 chronic kidney disease, or unspecified chronic kidney disease: Secondary | ICD-10-CM | POA: Diagnosis not present

## 2020-05-08 DIAGNOSIS — N183 Chronic kidney disease, stage 3 unspecified: Secondary | ICD-10-CM | POA: Diagnosis not present

## 2020-05-08 NOTE — Progress Notes (Signed)
[place] OFFICE PROGRESS NOTE  Patient Care Team: [name], MD as PCP - General (Family Medicine) Bond, Tracie Harrier, MD as Consulting Physician (Ophthalmology) Abbie Sons, MD as Consulting Physician (Urology) Cammie Sickle, MD as Consulting Physician (Hematology and Oncology)  Cancer Staging No matching staging information was found for the patient.   Oncology History Overview Note  # external beam radiation therapy and radiation seed implant in 2005 with Dr. Joelyn Oms and Dr. Danny Lawless for T1c Gleason 3+4 = 7 prostate cancer with a pretreatment PSA of 6.8. In November 2015, he was found to have biochemical recurrence.  Dec 2016- CT a/p & Bone scan- NEG; PSA- 11; June 2017- 19; STARTED on Firmagon [Dr.Budzyn]; on Lupron q [place]  # JAN 2021-PSA rising-3.5.  Bone scan-progression of disease. START X-Tandi 120mg /day; stop in July 2021[[severe weight loss/fatigue]]; started January 17, 2020 80 mg a day   # OCT 2018- Anemia-? Etiology; Likely anemia of chronic disease [anti-CCP elevated/ but NO RA; rheumatology; ]BMBx- hypercellular 30%; relative myeloid hyperplasia/erythroid hypoplasia; FISH- 5/7/8-Normal. II OPINION at [facility]- NEG  # CKD- III; Bladder neck obstruction s/p Foley [Dr.Stoiff]  # JAN 2018- Dexa scan- N  # NGS-P # PALLIATIVE CARE-P ------------------------------------   DIAGNOSIS: PROSTATE CANCER  STAGE:  IV       ;GOALS: control  CURRENT/MOST RECENT THERAPY: Lupron+ X-tandi    Primary adenocarcinoma of prostate ([place])    INTERVAL HISTORY:  Thomas Lopez 86 y.o.  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  Patient had been doing fairly well until yesterday when he had a COVID booster vaccination.  He complains of extreme fatigue.  Complains of joint pains myalgias.  No fever no chills.  No nausea  vomiting.  Feels poorly.  No dizzy spells or syncopal episodes.  Review of Systems  Constitutional: Positive for malaise/fatigue and weight loss. Negative for chills, diaphoresis and fever.  HENT: Negative for nosebleeds and sore throat.   Eyes: Negative for double vision.  Respiratory: Negative for cough, hemoptysis, sputum production and wheezing.   Cardiovascular: Negative for chest pain, palpitations, orthopnea and leg swelling.  Gastrointestinal: Negative for abdominal pain, blood in stool, constipation, diarrhea, heartburn, melena, nausea and vomiting.  Genitourinary: Negative for dysuria, frequency and urgency.  Musculoskeletal: Positive for joint pain. Negative for back pain.  Skin: Negative.  Negative for itching and rash.  Neurological: Negative for dizziness, tingling, focal weakness, weakness and headaches.  Endo/Heme/Allergies: Does not bruise/bleed easily.  Psychiatric/Behavioral: Negative for depression. The patient is not nervous/anxious and does not have insomnia.       PAST MEDICAL HISTORY :  Past Medical History:  Diagnosis Date  . Aorto-iliac atherosclerosis ([place]) 04/2015   by CT scan  . Carotid stenosis 11/2014   mild bilateral 1-39%, rpt 2 yrs  . Colon polyps    rpt colonoscopy due 2014  . ED (erectile dysfunction)    Trimix and VED  . Esophagitis   . Essential hypertension 06/29/2009  . GERD (gastroesophageal reflux disease)   . Glaucoma    severe ([place])  . HLD (hyperlipidemia)   . HTN (hypertension)   . Hyperglycemia   . IBS (irritable bowel syndrome)   . Prostate cancer [facility]) 2005   s/p seed implant, EBRT (Dr. Alinda Money at [facility]) recurrent treating with androgen deprivation referred to cancer center  . Smoking history     PAST SURGICAL  HISTORY :   Past Surgical History:  Procedure Laterality Date  . CATARACT EXTRACTION Left 05/2017   Dr. Edilia Bo  . CATARACT EXTRACTION, BILATERAL  December 2016   [place], Dr. Raynelle Fanning  .  COLONOSCOPY  03/01/01   Multiple polyps, divertic//adenomatous polyps  . COLONOSCOPY  04/24/01   multiple polyps  . COLONOSCOPY  10/23/01   Polyps benign-repeat every 2 years  . COLONOSCOPY  06/23/04   Adenom/hyperplastic polyps; multiple external hemorrhoids  . COLONOSCOPY  06/08/07   single small polyp-repeat 5 years  . CYSTOSCOPY WITH INSERTION OF UROLIFT N/A 04/10/2018   Procedure: CYSTOSCOPY WITH INSERTION OF UROLIFT;  Surgeon: Abbie Sons, MD;  Location: [facility] ORS;  Service: Urology;  Laterality: N/A;  . ESOPHAGOGASTRODUODENOSCOPY  03/01/01   H.H.; gastritis esoph. dudodenitis  . NCS/Wrists Left Carpal  2/02   Min./right improved  . PFTs  10/2012   FVC 64%, FEV1 41%, ratio 0.62  . PROSTATE BIOPSY  12/04   positive; radioactive seed implant (Dr. Joelyn Oms)  . Prostate Ext Beam Radiation  1/27-07/23/03   Dr. Danny Lawless  . SPIROMETRY  09/2011   WNL  . TONSILLECTOMY    . Wrist Release  6/98; 9/99   Right    FAMILY HISTORY :   Family History  Problem Relation Age of Onset  . Cancer Mother        colon  . Hyperlipidemia Mother   . Other Mother        Carotid disease  . Coronary artery disease Neg Hx   . Stroke Neg Hx     SOCIAL HISTORY:   Social History   Tobacco Use  . Smoking status: Former Smoker    Quit date: 05/23/1993    Years since quitting: 26.9  . Smokeless tobacco: Never Used  Vaping Use  . Vaping Use: Never used  Substance Use Topics  . Alcohol use: Yes    Comment: Rare  . Drug use: No    ALLERGIES:  has No Known Allergies.  MEDICATIONS:  Current Outpatient Medications  Medication Sig Dispense Refill  . aspirin EC 81 MG tablet Take 81 mg by mouth daily.    . Cholecalciferol (VITAMIN D3) 25 MCG (1000 UT) CAPS Take 1 capsule (1,000 Units total) by mouth daily. 30 capsule   . dorzolamide-timolol (COSOPT) 22.3-6.8 MG/ML ophthalmic solution Place 1 drop into the left eye 2 (two) times daily.    . enzalutamide (XTANDI) 40 MG capsule Take 2 capsules (80 mg  total) by mouth daily. 60 capsule 3  . ferrous sulfate 325 (65 FE) MG tablet Take 1 tablet (325 mg total) by mouth daily with breakfast.  3  . Leuprolide Acetate, 3 Month, (ELIGARD) 22.5 MG injection Inject 22.5 mg into the skin every 3 (three) months. 1 each 3  . lisinopril (ZESTRIL) 10 MG tablet TAKE 1 TABLET BY MOUTH DAILY (NEW DOSE) 90 tablet 0  . LUMIGAN 0.01 % SOLN     . mirabegron ER (MYRBETRIQ) 25 MG TB24 tablet Take 1 tablet (25 mg total) by mouth daily. 30 tablet 11  . omeprazole (PRILOSEC) 40 MG capsule TAKE 1 CAPSULE EVERY DAY 90 capsule 0  . predniSONE (DELTASONE) 10 MG tablet Take 1 tablet (10 mg total) by mouth daily with breakfast. 30 tablet 1  . rosuvastatin (CRESTOR) 10 MG tablet TAKE 1 TABLET EVERY DAY 90 tablet 0  . tamsulosin (FLOMAX) 0.4 MG CAPS capsule Take 0.4 mg by mouth. After supper    . timolol (  TIMOPTIC) 0.5 % ophthalmic solution Place 1 drop into the left eye 2 (two) times daily.     Marland Kitchen venlafaxine XR (EFFEXOR-XR) 37.5 MG 24 hr capsule TAKE 1 CAPSULE EVERY DAY WITH BREAKFAST 90 capsule 0  . trospium (SANCTURA) 20 MG tablet TAKE ONE TABLET BY MOUTH EVERY NIGHT AT BEDTIME (Patient not taking: No sig reported) 30 tablet 0   No current facility-administered medications for this visit.    PHYSICAL EXAMINATION: ECOG PERFORMANCE STATUS: 1 - Symptomatic but completely ambulatory  BP (!) 99/49 (BP Location: Left Arm, Patient Position: Sitting, Cuff Size: Normal)   Pulse 62   Temp 98 F (36.7 C) (Tympanic)   Resp 16   Ht 5\' 10"  (1.778 m)   Wt 137 lb 9.6 oz (62.4 kg)   SpO2 100%   BMI 19.74 kg/m   Filed Weights   05/08/20 1406  Weight: 137 lb 9.6 oz (62.4 kg)    Physical Exam Constitutional:      Comments: Thin built cachectic appearing male patient .  He is accompanied by his wife.  Walking by himself.    HENT:     Head: Normocephalic and atraumatic.     Mouth/Throat:     Pharynx: No oropharyngeal exudate.  Eyes:     Pupils: Pupils are equal, round, and  reactive to light.  Cardiovascular:     Rate and Rhythm: Normal rate and regular rhythm.  Pulmonary:     Effort: No respiratory distress.     Breath sounds: No wheezing.     Comments: Decreased air entry bilaterally.  No wheeze or crackles. Abdominal:     General: Bowel sounds are normal. There is no distension.     Palpations: Abdomen is soft. There is no mass.     Tenderness: There is no abdominal tenderness. There is no guarding or rebound.  Musculoskeletal:        General: No tenderness. Normal range of motion.     Cervical back: Normal range of motion and neck supple.  Skin:    General: Skin is warm.  Neurological:     Mental Status: He is alert and oriented to person, place, and time.  Psychiatric:        Mood and Affect: Affect normal.    LABORATORY DATA:  I have reviewed the data as listed    Component Value Date/Time   NA 143 05/06/2020 1404   K 4.3 05/06/2020 1404   CL 106 05/06/2020 1404   CO2 26 05/06/2020 1404   GLUCOSE 112 (H) 05/06/2020 1404   BUN 22 05/06/2020 1404   CREATININE 1.07 05/06/2020 1404   CALCIUM 9.2 05/06/2020 1404   PROT 8.0 05/06/2020 1404   ALBUMIN 3.9 05/06/2020 1404   AST 15 05/06/2020 1404   ALT 10 05/06/2020 1404   ALKPHOS 47 05/06/2020 1404   BILITOT 0.4 05/06/2020 1404   GFRNONAA >60 05/06/2020 1404   GFRAA 44 (L) 02/12/2020 1319    No results found for: SPEP, UPEP  Lab Results  Component Value Date   WBC 6.9 05/06/2020   NEUTROABS 5.6 05/06/2020   HGB 10.5 (L) 05/06/2020   HCT 34.6 (L) 05/06/2020   MCV 87.6 05/06/2020   PLT 269 05/06/2020      Chemistry      Component Value Date/Time   NA 143 05/06/2020 1404   K 4.3 05/06/2020 1404   CL 106 05/06/2020 1404   CO2 26 05/06/2020 1404   BUN 22 05/06/2020 1404   CREATININE 1.07  05/06/2020 1404      Component Value Date/Time   CALCIUM 9.2 05/06/2020 1404   ALKPHOS 47 05/06/2020 1404   AST 15 05/06/2020 1404   ALT 10 05/06/2020 1404   BILITOT 0.4 05/06/2020 1404        RADIOGRAPHIC STUDIES: I have personally reviewed the radiological images as listed and agreed with the findings in the report. No results found.   ASSESSMENT & PLAN:  Primary adenocarcinoma of prostate ([place]) # Metastatic prostate cancer castrate resistant- on Lupron 22.5 mg;-bone- scan shows new lesions in right ilaic/ ribs/ calvarium-June 3rd PET- improved bone lesions;  prostate uptake noted. NOV 2021- <0.01; STABLE.  Hold Eligard given feeling poorly.  # continune Xtandi 2 pills [weight loss on 3 pills]; monitor closely.   # Fatigue-sleepy/relative hypotension? Sec to Booster injection; recommend holding lisinopril increased fluid intake.Marland Kitchen   #Anemia secondary CKD-hemoglobin 10.2 hold retacrit.  September 2021- iron saturation 15 ferritin 33; PO iron.   #CKD stage III -GFR 52.STABLE.  # Bone lesions-castrate resistant prostate cancer; Zometa infusions every 3 months [10/22-ca-9.7]; plan at next visit.  # DISPOSITION: # HOLD RETACRIT/HOLD eligard  # in 5 weeks - possible retacrit; Eligard; Zometa; 2-3 days prior labs- cbc/cmp; PSA;-Dr.B     No orders of the defined types were placed in this encounter.  All questions were answered. The patient knows to call the clinic with any problems, questions or concerns.      Cammie Sickle, MD 05/08/2020 2:48 PM

## 2020-05-08 NOTE — Assessment & Plan Note (Addendum)
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 NOV 2021- <0.01; STABLE.  Hold Eligard given feeling poorly.  # continune Xtandi 2 pills [weight loss on 3 pills]; monitor closely.   # Fatigue-sleepy/relative hypotension? Sec to Booster injection; recommend holding lisinopril increased fluid intake.Marland Kitchen   #Anemia secondary CKD-hemoglobin 10.2 hold retacrit.  September 2021- iron saturation 15 ferritin 33; PO iron.   #CKD stage III -GFR 52.STABLE.  # Bone lesions-castrate resistant prostate cancer; Zometa infusions every 3 months [10/22-ca-9.7]; plan at next visit.  # DISPOSITION: # HOLD RETACRIT/HOLD eligard  # in 5 weeks - possible retacrit; Eligard; Zometa; 2-3 days prior labs- cbc/cmp; PSA;-Dr.B

## 2020-05-08 NOTE — Progress Notes (Signed)
Pt just had a COVID booster yesterday and does not feel well today. Feels very fatigue

## 2020-05-08 NOTE — Patient Instructions (Signed)
#   STOP LISINOPRIL until further directions  # drink plenty of fluids

## 2020-05-25 ENCOUNTER — Inpatient Hospital Stay: Payer: Medicare PPO | Attending: Internal Medicine

## 2020-05-25 DIAGNOSIS — C7951 Secondary malignant neoplasm of bone: Secondary | ICD-10-CM | POA: Insufficient documentation

## 2020-05-25 DIAGNOSIS — Z7982 Long term (current) use of aspirin: Secondary | ICD-10-CM | POA: Insufficient documentation

## 2020-05-25 DIAGNOSIS — Z7952 Long term (current) use of systemic steroids: Secondary | ICD-10-CM | POA: Insufficient documentation

## 2020-05-25 DIAGNOSIS — K589 Irritable bowel syndrome without diarrhea: Secondary | ICD-10-CM | POA: Insufficient documentation

## 2020-05-25 DIAGNOSIS — N183 Chronic kidney disease, stage 3 unspecified: Secondary | ICD-10-CM | POA: Insufficient documentation

## 2020-05-25 DIAGNOSIS — D631 Anemia in chronic kidney disease: Secondary | ICD-10-CM | POA: Insufficient documentation

## 2020-05-25 DIAGNOSIS — Z79899 Other long term (current) drug therapy: Secondary | ICD-10-CM | POA: Insufficient documentation

## 2020-05-25 DIAGNOSIS — Z87891 Personal history of nicotine dependence: Secondary | ICD-10-CM | POA: Insufficient documentation

## 2020-05-25 DIAGNOSIS — Z79818 Long term (current) use of other agents affecting estrogen receptors and estrogen levels: Secondary | ICD-10-CM | POA: Insufficient documentation

## 2020-05-25 DIAGNOSIS — E785 Hyperlipidemia, unspecified: Secondary | ICD-10-CM | POA: Insufficient documentation

## 2020-05-25 DIAGNOSIS — C61 Malignant neoplasm of prostate: Secondary | ICD-10-CM | POA: Insufficient documentation

## 2020-05-25 DIAGNOSIS — I129 Hypertensive chronic kidney disease with stage 1 through stage 4 chronic kidney disease, or unspecified chronic kidney disease: Secondary | ICD-10-CM | POA: Insufficient documentation

## 2020-05-25 NOTE — Progress Notes (Signed)
Nutrition Follow-up:  Patient with metastatic prostate cancer to bone.    Spoke with patient via phone for nutrition follow-up.  Patient reports that his appetite is good.  Ate eggs, bacon, grits, coffee this am for breakfast.  Ate baked chicken yesterday but can't remember anything else he ate.  Says that he is eating brownies for bedtime snack. Drinks 1 boost mostly sometimes 2 per day.     Medications: reviewed Labs: reviewed  Anthropometrics:   Weight 137 lb 9.6 oz on 12/17 increased from 135 on 11/22  NUTRITION DIAGNOSIS: Unintentional weight loss improved   INTERVENTION:  Patient to add milk to drink with brownie as bedtime snack.   Encouraged to continue boost 1-2 times per day. 3    MONITORING, EVALUATION, GOAL: weight trends, intake   NEXT VISIT: 6 weeks, phone  Nawal Burling B. Freida Busman, RD, LDN Registered Dietitian [phone] (mobile)

## 2020-05-28 ENCOUNTER — Other Ambulatory Visit: Payer: Self-pay | Admitting: Internal Medicine

## 2020-05-28 DIAGNOSIS — C61 Malignant neoplasm of prostate: Secondary | ICD-10-CM

## 2020-05-29 ENCOUNTER — Other Ambulatory Visit: Payer: Self-pay | Admitting: Nurse Practitioner

## 2020-05-29 NOTE — Telephone Encounter (Signed)
Please consider RF request 

## 2020-05-30 ENCOUNTER — Other Ambulatory Visit: Payer: Self-pay | Admitting: Family Medicine

## 2020-05-30 DIAGNOSIS — E782 Mixed hyperlipidemia: Secondary | ICD-10-CM

## 2020-05-30 DIAGNOSIS — C61 Malignant neoplasm of prostate: Secondary | ICD-10-CM

## 2020-05-30 DIAGNOSIS — N1831 Chronic kidney disease, stage 3a: Secondary | ICD-10-CM

## 2020-05-30 DIAGNOSIS — R7303 Prediabetes: Secondary | ICD-10-CM

## 2020-05-30 DIAGNOSIS — R778 Other specified abnormalities of plasma proteins: Secondary | ICD-10-CM

## 2020-05-30 DIAGNOSIS — D472 Monoclonal gammopathy: Secondary | ICD-10-CM

## 2020-06-01 ENCOUNTER — Ambulatory Visit: Payer: Medicare PPO

## 2020-06-02 ENCOUNTER — Other Ambulatory Visit: Payer: Medicare PPO

## 2020-06-04 ENCOUNTER — Telehealth: Payer: Self-pay | Admitting: Pharmacy Technician

## 2020-06-04 NOTE — Telephone Encounter (Signed)
Oral Oncology Patient Advocate Encounter  Received email from [place] that patients grant through [facility] expired and PAN grant was rejecting.  PANF grant closed due to non-use.  Tried calling patient on home and cell phone.  No answer or way to leave a message on either.  Will try back again this afternoon.  We will need to apply for assistance through [place] for patient to obtain medication.  [place] Patient [place] Phone [phone] Fax [phone] 06/04/2020 1:19 PM

## 2020-06-09 ENCOUNTER — Encounter: Payer: Medicare PPO | Admitting: Family Medicine

## 2020-06-09 ENCOUNTER — Ambulatory Visit (INDEPENDENT_AMBULATORY_CARE_PROVIDER_SITE_OTHER): Payer: Medicare PPO

## 2020-06-09 DIAGNOSIS — Z0000 Encounter for general adult medical examination without abnormal findings: Secondary | ICD-10-CM | POA: Diagnosis not present

## 2020-06-09 NOTE — Progress Notes (Signed)
PCP notes:  Health Maintenance: Tdap- insurance    Abnormal Screenings: MMSE score 18   Patient concerns: none   Nurse concerns: none   Next PCP appt.: none

## 2020-06-09 NOTE — Progress Notes (Signed)
Subjective:   Thomas Lopez is a 87 y.o. male who presents for Medicare Annual/Subsequent preventive examination.  Review of Systems: N/A      I connected with the patient today by telephone and verified that I am speaking with the correct person using two identifiers. Location patient: home Location nurse: work Persons participating in the telephone visit: patient, nurse.   I discussed the limitations, risks, security and privacy concerns of performing an evaluation and management service by telephone and the availability of in person appointments. I also discussed with the patient that there may be a patient responsible charge related to this service. The patient expressed understanding and verbally consented to this telephonic visit.        Cardiac Risk Factors include: advanced age (>66men, >17 women);male gender;hypertension;Other (see comment), Risk factor comments: hyperlipidemia     Objective:    Today's Vitals   There is no height or weight on file to calculate BMI.  Advanced Directives 06/09/2020 04/10/2020 03/13/2020 06/17/2019 02/22/2019 01/14/2019 10/31/2018  Does Patient Have a Medical Advance Directive? No No No No Yes Yes Yes  Type of Advance Directive - - - - Living will - [place];Living will  Does patient want to make changes to medical advance directive? - - - - - No - Patient declined -  Copy of [place] in Chart? - - - - - - No - copy requested  Would patient like information on creating a medical advance directive? No - Patient declined - - No - Patient declined - - No - Patient declined    Current Medications (verified) Outpatient Encounter Medications as of 06/09/2020  Medication Sig  . aspirin EC 81 MG tablet Take 81 mg by mouth daily.  . Cholecalciferol (VITAMIN D3) 25 MCG (1000 UT) CAPS Take 1 capsule (1,000 Units total) by mouth daily.  . dorzolamide-timolol (COSOPT) 22.3-6.8 MG/ML ophthalmic solution Place 1  drop into the left eye 2 (two) times daily.  . ferrous sulfate 325 (65 FE) MG tablet Take 1 tablet (325 mg total) by mouth daily with breakfast.  . Leuprolide Acetate, 3 Month, (ELIGARD) 22.5 MG injection Inject 22.5 mg into the skin every 3 (three) months.  Marland Kitchen lisinopril (ZESTRIL) 10 MG tablet TAKE 1 TABLET BY MOUTH DAILY (NEW DOSE)  . LUMIGAN 0.01 % SOLN   . mirabegron ER (MYRBETRIQ) 25 MG TB24 tablet Take 1 tablet (25 mg total) by mouth daily.  Marland Kitchen omeprazole (PRILOSEC) 40 MG capsule TAKE 1 CAPSULE EVERY DAY  . predniSONE (DELTASONE) 10 MG tablet Take 1 tablet (10 mg total) by mouth daily with breakfast.  . rosuvastatin (CRESTOR) 10 MG tablet TAKE 1 TABLET EVERY DAY  . tamsulosin (FLOMAX) 0.4 MG CAPS capsule Take 0.4 mg by mouth. After supper  . timolol (TIMOPTIC) 0.5 % ophthalmic solution Place 1 drop into the left eye 2 (two) times daily.   . trospium (SANCTURA) 20 MG tablet TAKE ONE TABLET BY MOUTH EVERY NIGHT AT BEDTIME  . venlafaxine XR (EFFEXOR-XR) 37.5 MG 24 hr capsule TAKE 1 CAPSULE EVERY DAY WITH BREAKFAST  . XTANDI 40 MG capsule TAKE 2 CAPSULES (80 MG TOTAL) BY MOUTH DAILY.   No facility-administered encounter medications on file as of 06/09/2020.    Allergies (verified) Patient has no known allergies.   History: Past Medical History:  Diagnosis Date  . Aorto-iliac atherosclerosis ([place]) 04/2015   by CT scan  . Carotid stenosis 11/2014   mild bilateral 1-39%, rpt 2  yrs  . Colon polyps    rpt colonoscopy due 2014  . ED (erectile dysfunction)    Trimix and VED  . Esophagitis   . Essential hypertension 06/29/2009  . GERD (gastroesophageal reflux disease)   . Glaucoma    severe ([place])  . HLD (hyperlipidemia)   . HTN (hypertension)   . Hyperglycemia   . IBS (irritable bowel syndrome)   . Prostate cancer [facility]) 2005   s/p seed implant, EBRT ([facility]. Alinda Money at [facility]) recurrent treating with androgen deprivation referred to cancer center  . Smoking history    Past Surgical  History:  Procedure Laterality Date  . CATARACT EXTRACTION Left 05/2017   Dr. Edilia Bo  . CATARACT EXTRACTION, BILATERAL  December 2016   [place], Dr. Raynelle Fanning  . COLONOSCOPY  03/01/01   Multiple polyps, divertic//adenomatous polyps  . COLONOSCOPY  04/24/01   multiple polyps  . COLONOSCOPY  10/23/01   Polyps benign-repeat every 2 years  . COLONOSCOPY  06/23/04   [place]/hyperplastic polyps; multiple external hemorrhoids  . COLONOSCOPY  06/08/07   single small polyp-repeat 5 years  . CYSTOSCOPY WITH INSERTION OF UROLIFT N/A 04/10/2018   Procedure: CYSTOSCOPY WITH INSERTION OF UROLIFT;  Surgeon: Abbie Sons, MD;  Location: [facility] ORS;  Service: Urology;  Laterality: N/A;  . ESOPHAGOGASTRODUODENOSCOPY  03/01/01   H.H.; gastritis esoph. dudodenitis  . NCS/Wrists Left Carpal  2/02   Min./right improved  . PFTs  10/2012   FVC 64%, FEV1 41%, ratio 0.62  . PROSTATE BIOPSY  12/04   positive; radioactive seed implant (Dr. Joelyn Oms)  . Prostate Ext Beam Radiation  1/27-07/23/03   Dr. Danny Lawless  . SPIROMETRY  09/2011   WNL  . TONSILLECTOMY    . Wrist Release  6/98; 9/99   Right   Family History  Problem Relation Age of Onset  . Cancer Mother        colon  . Hyperlipidemia Mother   . Other Mother        Carotid disease  . Coronary artery disease Neg Hx   . Stroke Neg Hx    Social History   Socioeconomic History  . Marital status: Married    Spouse name: Not on file  . Number of children: 5  . Years of education: Not on file  . Highest education level: Not on file  Occupational History  . Occupation: Retired  Tobacco Use  . Smoking status: Former Smoker    Quit date: 05/23/1993    Years since quitting: 27.0  . Smokeless tobacco: Never Used  Vaping Use  . Vaping Use: Never used  Substance and Sexual Activity  . Alcohol use: Not Currently    Comment: Rare  . Drug use: No  . Sexual activity: Yes  Other Topics Concern  . 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 Difficulty of Paying Living Expenses: Not hard at all  Food Insecurity: No Food Insecurity  . Worried About Charity fundraiser in the Last Year: Never true  . Ran Out of Food in the Last Year: Never true  Transportation Needs: No Transportation Needs  . Lack of Transportation (Medical): No  . Lack of Transportation (Non-Medical): No  Physical  Activity: Inactive  . Days of Exercise per Week: 0 days  . Minutes of Exercise per Session: 0 min  Stress: No Stress Concern Present  . Feeling of Stress : Not at all  Social Connections: Not on file    Tobacco Counseling Counseling given: Not Answered   Clinical Intake:  Pre-visit preparation completed: Yes  Pain : No/denies pain     Diabetes: No  How often do you need to have someone help you when you read instructions, pamphlets, or other written materials from your doctor or pharmacy?: 1 - Never What is the last grade level you completed in school?: 12th  Diabetic: No Nutrition Risk Assessment:  Has the patient had any N/V/D within the last 2 months?  No  Does the patient have any non-healing wounds?  No  Has the patient had any unintentional weight loss or weight gain?  No   Diabetes:  Is the patient diabetic?  No  If diabetic, was a CBG obtained today?  N/A Did the patient bring in their glucometer from home?  N/A How often do you monitor your CBG's? N/A.   Financial Strains and Diabetes Management:  Are you having any financial strains with the device, your supplies or your medication? N/A.  Does the patient want to be seen by Chronic Care Management for management of their diabetes?  N/A Would the patient like to be referred to a Nutritionist or for Diabetic  Management?  N/A    Interpreter Needed?: No  Information entered by :: CJohnson, LPN   Activities of Daily Living In your present state of health, do you have any difficulty performing the following activities: 06/09/2020  Hearing? N  Vision? N  Difficulty concentrating or making decisions? N  Walking or climbing stairs? N  Dressing or bathing? N  Doing errands, shopping? N  Preparing Food and eating ? N  Using the Toilet? N  In the past six months, have you accidently leaked urine? N  Do you have problems with loss of bowel control? N  Managing your Medications? N  Managing your Finances? N  Housekeeping or managing your Housekeeping? N  Some recent data might be hidden    Patient Care Team: [name], MD as PCP - General (Family Medicine) [place], Tracie Harrier, MD as Consulting Physician (Ophthalmology) Abbie Sons, MD as Consulting Physician (Urology) Cammie Sickle, MD as Consulting Physician (Hematology and Oncology)  Indicate any recent Medical Services you may have received from other than [facility] providers in the past year (date may be approximate).     Assessment:   This is a routine wellness examination for Delrae Randon.  Hearing/Vision screen  Hearing Screening   125Hz  250Hz  500Hz  1000Hz  2000Hz  3000Hz  4000Hz  6000Hz  8000Hz   Right ear:           Left ear:           Vision Screening Comments: Patient gets annual eye exams   Dietary issues and exercise activities discussed: Current Exercise Habits: The patient does not participate in regular exercise at present, Exercise limited by: None identified  Goals    . Patient Stated     Starting 10/31/2018, I will continue to take medications as prescribed.     . Patient Stated     06/09/2020, I will maintain and continue medications as prescribed.       Depression Screen PHQ 2/9 Scores 06/09/2020 10/31/2018 10/11/2017 08/15/2016 08/14/2015 08/13/2014 08/09/2013  PHQ - 2 Score 0 0 0 0 0 0  0  PHQ- 9 Score 0 0  0 - - - -    Fall Risk Fall Risk  06/09/2020 10/31/2018 10/11/2017 04/28/2017 08/15/2016  Falls in the past year? 0 0 No No No  Number falls in past yr: 0 - - - -  Comment - - - - -  Injury with Fall? 0 - - - -  Comment - - - - -  Risk for fall due to : Medication side effect - - - -  Follow up Falls evaluation completed;Falls prevention discussed - - - -    FALL RISK PREVENTION PERTAINING TO THE HOME:  Any stairs in or around the home? Yes  If so, are there any without handrails? No  Home free of loose throw rugs in walkways, pet beds, electrical cords, etc? Yes  Adequate lighting in your home to reduce risk of falls? Yes   ASSISTIVE DEVICES UTILIZED TO PREVENT FALLS:  Life alert? No  Use of a cane, walker or w/c? No  Grab bars in the bathroom? No  Shower chair or bench in shower? No  Elevated toilet seat or a handicapped toilet? No   TIMED UP AND GO:  Was the test performed? N/A telephone visit.    Cognitive Function: MMSE - Mini Mental State Exam 06/09/2020 10/31/2018 10/11/2017 08/15/2016 08/14/2015  Orientation to time 4 5 5 5 5   Orientation to Place 5 5 5 5 5   Registration 3 3 3 3 3   Attention/ Calculation 5 0 0 0 0  Recall 0 3 1 3 3   Recall-comments - - unable to recall 2 of 3 words - -  Language- name 2 objects - 0 0 0 0  Language- repeat 1 1 1 1 1   Language- follow 3 step command - 0 3 3 3   Language- read & follow direction - 0 0 0 0  Write a sentence - 0 0 0 0  Copy design - 0 0 0 0  Total score - 17 18 20 20   Mini Cog  Mini-Cog screen was completed. Maximum score is 22. A value of 0 denotes this part of the MMSE was not completed or the patient failed this part of the Mini-Cog screening.       Immunizations Immunization History  Administered Date(s) Administered  . Fluad Quad(high Dose 65+) 03/12/2019, 03/25/2020  . H1N1 06/24/2008  . Influenza Split 03/03/2011, 02/29/2012  . Influenza Whole 04/06/2007, 02/19/2008, 03/24/2009, 02/25/2010  .  Influenza,inj,Quad PF,6+ Mos 02/26/2013, 02/19/2014, 02/25/2015, 02/12/2016, 02/17/2017, 03/29/2018  . PFIZER(Purple Top)SARS-COV-2 Vaccination 07/21/2019, 08/20/2019, 05/19/2020  . Pneumococcal Conjugate-13 08/09/2013  . Pneumococcal Polysaccharide-23 03/15/2002  . Td 05/26/2006  . Zoster 07/01/2010    TDAP status: Due, Education has been provided regarding the importance of this vaccine. Advised may receive this vaccine at local pharmacy or Health Dept. Aware to provide a copy of the vaccination record if obtained from local pharmacy or Health Dept. Verbalized acceptance and understanding.  Flu Vaccine status: Up to date  Pneumococcal vaccine status: Up to date  Covid-19 vaccine status: Completed vaccines  Qualifies for Shingles Vaccine? Yes   Zostavax completed Yes   Shingrix Completed?: No.    Education has been provided regarding the importance of this vaccine. Patient has been advised to call insurance company to determine out of pocket expense if they have not yet received this vaccine. Advised may also receive vaccine at local pharmacy or Health Dept. Verbalized acceptance and understanding.  Screening Tests Health Maintenance  Topic Date Due  .  TETANUS/TDAP  06/10/2023 (Originally 05/26/2016)  . COLONOSCOPY (Pts 45-15yrs Insurance coverage will need to be confirmed)  05/22/2049 (Originally 09/24/2017)  . COVID-19 Vaccine (4 - Booster for Pfizer series) 11/17/2020  . INFLUENZA VACCINE  Completed  . PNA vac Low Risk Adult  Completed    Health Maintenance  There are no preventive care reminders to display for this patient.  Colorectal cancer screening: No longer required.   Lung Cancer Screening: (Low Dose CT Chest recommended if Age 58-80 years, 30 pack-year currently smoking OR have quit w/in 15years.) does not qualify.   Additional Screening:  Hepatitis C Screening: does not qualify; Completed N/A  Vision Screening: Recommended annual ophthalmology exams for early  detection of glaucoma and other disorders of the eye. Is the patient up to date with their annual eye exam?  Yes  Who is the provider or what is the name of the office in which the patient attends annual eye exams? Dr. Clemens Catholic  If pt is not established with a provider, would they like to be referred to a provider to establish care? No .   Dental Screening: Recommended annual dental exams for proper oral hygiene  Community Resource Referral / Chronic Care Management: CRR required this visit?  No   CCM required this visit?  No      Plan:     I have personally reviewed and noted the following in the patient's chart:   . Medical and social history . Use of alcohol, tobacco or illicit drugs  . Current medications and supplements . Functional ability and status . Nutritional status . Physical activity . Advanced directives . List of other physicians . Hospitalizations, surgeries, and ER visits in previous 12 months . Vitals . Screenings to include cognitive, depression, and falls . Referrals and appointments  In addition, I have reviewed and discussed with patient certain preventive protocols, quality metrics, and best practice recommendations. A written personalized care plan for preventive services as well as general preventive health recommendations were provided to patient.   Due to this being a telephonic visit, the after visit summary with patients personalized plan was offered to patient via office or my-chart. Patient preferred to pick up at office at next visit or via mychart.   Andrez Grime, LPN   [id number]

## 2020-06-09 NOTE — Patient Instructions (Signed)
Thomas Lopez , Thank you for taking time to come for your Medicare Wellness Visit. I appreciate your ongoing commitment to your health goals. Please review the following plan we discussed and let me know if I can assist you in the future.   Screening recommendations/referrals: Colonoscopy: no longer required Recommended yearly ophthalmology/optometry visit for glaucoma screening and checkup Recommended yearly dental visit for hygiene and checkup  Vaccinations: Influenza vaccine: Up to date, completed 03/25/2020, due 12/2020 Pneumococcal vaccine: Completed series Tdap vaccine: decline-insurance Shingles vaccine: due, check with your insurance regarding coverage if interested    Covid-19: Completed series  Advanced directives: Advance directive discussed with you today. Even though you declined this today please call our office should you change your mind and we can give you the proper paperwork for you to fill out.  Conditions/risks identified: hypertension, hyperlipidemia  Next appointment: Follow up in one year for your annual wellness visit.   Preventive Care 85 Years and Older, Male Preventive care refers to lifestyle choices and visits with your health care provider that can promote health and wellness. What does preventive care include?  A yearly physical exam. This is also called an annual well check.  Dental exams once or twice a year.  Routine eye exams. Ask your health care provider how often you should have your eyes checked.  Personal lifestyle choices, including:  Daily care of your teeth and gums.  Regular physical activity.  Eating a healthy diet.  Avoiding tobacco and drug use.  Limiting alcohol use.  Practicing safe sex.  Taking low doses of aspirin every day.  Taking vitamin and mineral supplements as recommended by your health care provider. What happens during an annual well check? The services and screenings done by your health care provider during your  annual well check will depend on your age, overall health, lifestyle risk factors, and family history of disease. Counseling  Your health care provider may ask you questions about your:  Alcohol use.  Tobacco use.  Drug use.  Emotional well-being.  Home and relationship well-being.  Sexual activity.  Eating habits.  History of falls.  Memory and ability to understand (cognition).  Work and work Statistician. Screening  You may have the following tests or measurements:  Height, weight, and BMI.  Blood pressure.  Lipid and cholesterol levels. These may be checked every 5 years, or more frequently if you are over 85 years old.  Skin check.  Lung cancer screening. You may have this screening every year starting at age 85 if you have a 30-pack-year history of smoking and currently smoke or have quit within the past 15 years.  Fecal occult blood test (FOBT) of the stool. You may have this test every year starting at age 85  Flexible sigmoidoscopy or colonoscopy. You may have a sigmoidoscopy every 5 years or a colonoscopy every 10 years starting at age 85  Prostate cancer screening. Recommendations will vary depending on your family history and other risks.  Hepatitis C blood test.  Hepatitis B blood test.  Sexually transmitted disease (STD) testing.  Diabetes screening. This is done by checking your blood sugar (glucose) after you have not eaten for a while (fasting). You may have this done every 1-3 years.  Abdominal aortic aneurysm (AAA) screening. You may need this if you are a current or former smoker.  Osteoporosis. You may be screened starting at age 52 if you are at high risk. Talk with your health care provider about your test results, treatment  options, and if necessary, the need for more tests. Vaccines  Your health care provider may recommend certain vaccines, such as:  Influenza vaccine. This is recommended every year.  Tetanus, diphtheria, and  acellular pertussis (Tdap, Td) vaccine. You may need a Td booster every 10 years.  Zoster vaccine. You may need this after age 35.  Pneumococcal 13-valent conjugate (PCV13) vaccine. One dose is recommended after age 64.  Pneumococcal polysaccharide (PPSV23) vaccine. One dose is recommended after age 9. Talk to your health care provider about which screenings and vaccines you need and how often you need them. This information is not intended to replace advice given to you by your health care provider. Make sure you discuss any questions you have with your health care provider. Document Released: 06/05/2015 Document Revised: 01/27/2016 Document Reviewed: 03/10/2015 Elsevier Interactive Patient Education  2017 [place] Prevention in the Home Falls can cause injuries. They can happen to people of all ages. There are many things you can do to make your home safe and to help prevent falls. What can I do on the outside of my home?  Regularly fix the edges of walkways and driveways and fix any cracks.  Remove anything that might make you trip as you walk through a door, such as a raised step or threshold.  Trim any bushes or trees on the path to your home.  Use bright outdoor lighting.  Clear any walking paths of anything that might make someone trip, such as rocks or tools.  Regularly check to see if handrails are loose or broken. Make sure that both sides of any steps have handrails.  Any raised decks and porches should have guardrails on the edges.  Have any leaves, snow, or ice cleared regularly.  Use sand or salt on walking paths during winter.  Clean up any spills in your garage right away. This includes oil or grease spills. What can I do in the bathroom?  Use night lights.  Install grab bars by the toilet and in the tub and shower. Do not use towel bars as grab bars.  Use non-skid mats or decals in the tub or shower.  If you need to sit down in the shower, use  a plastic, non-slip stool.  Keep the floor dry. Clean up any water that spills on the floor as soon as it happens.  Remove soap buildup in the tub or shower regularly.  Attach bath mats securely with double-sided non-slip rug tape.  Do not have throw rugs and other things on the floor that can make you trip. What can I do in the bedroom?  Use night lights.  Make sure that you have a light by your bed that is easy to reach.  Do not use any sheets or blankets that are too big for your bed. They should not hang down onto the floor.  Have a firm chair that has side arms. You can use this for support while you get dressed.  Do not have throw rugs and other things on the floor that can make you trip. What can I do in the kitchen?  Clean up any spills right away.  Avoid walking on wet floors.  Keep items that you use a lot in easy-to-reach places.  If you need to reach something above you, use a strong step stool that has a grab bar.  Keep electrical cords out of the way.  Do not use floor polish or wax that makes floors slippery.  If you must use wax, use non-skid floor wax.  Do not have throw rugs and other things on the floor that can make you trip. What can I do with my stairs?  Do not leave any items on the stairs.  Make sure that there are handrails on both sides of the stairs and use them. Fix handrails that are broken or loose. Make sure that handrails are as long as the stairways.  Check any carpeting to make sure that it is firmly attached to the stairs. Fix any carpet that is loose or worn.  Avoid having throw rugs at the top or bottom of the stairs. If you do have throw rugs, attach them to the floor with carpet tape.  Make sure that you have a light switch at the top of the stairs and the bottom of the stairs. If you do not have them, ask someone to add them for you. What else can I do to help prevent falls?  Wear shoes that:  Do not have high heels.  Have  rubber bottoms.  Are comfortable and fit you well.  Are closed at the toe. Do not wear sandals.  If you use a stepladder:  Make sure that it is fully opened. Do not climb a closed stepladder.  Make sure that both sides of the stepladder are locked into place.  Ask someone to hold it for you, if possible.  Clearly mark and make sure that you can see:  Any grab bars or handrails.  First and last steps.  Where the edge of each step is.  Use tools that help you move around (mobility aids) if they are needed. These include:  Canes.  Walkers.  Scooters.  Crutches.  Turn on the lights when you go into a dark area. Replace any light bulbs as soon as they burn out.  Set up your furniture so you have a clear path. Avoid moving your furniture around.  If any of your floors are uneven, fix them.  If there are any pets around you, be aware of where they are.  Review your medicines with your doctor. Some medicines can make you feel dizzy. This can increase your chance of falling. Ask your doctor what other things that you can do to help prevent falls. This information is not intended to replace advice given to you by your health care provider. Make sure you discuss any questions you have with your health care provider. Document Released: 03/05/2009 Document Revised: 10/15/2015 Document Reviewed: 06/13/2014 Elsevier Interactive Patient Education  2017 [employer].

## 2020-06-10 ENCOUNTER — Inpatient Hospital Stay: Payer: Medicare PPO

## 2020-06-10 NOTE — Telephone Encounter (Signed)
Called patient since we did not get to meet because of the snow on Tuesday.  Patient has appt on Friday and I told him I would meet with him then to sign the application.  [place] Patient [place] Phone [phone] Fax [phone] 06/10/2020 2:32 PM

## 2020-06-12 ENCOUNTER — Inpatient Hospital Stay: Payer: Medicare PPO

## 2020-06-12 ENCOUNTER — Other Ambulatory Visit: Payer: Self-pay | Admitting: *Deleted

## 2020-06-12 ENCOUNTER — Encounter: Payer: Self-pay | Admitting: Internal Medicine

## 2020-06-12 ENCOUNTER — Other Ambulatory Visit: Payer: Self-pay

## 2020-06-12 ENCOUNTER — Inpatient Hospital Stay (HOSPITAL_BASED_OUTPATIENT_CLINIC_OR_DEPARTMENT_OTHER): Payer: Medicare PPO | Admitting: Internal Medicine

## 2020-06-12 DIAGNOSIS — Z7952 Long term (current) use of systemic steroids: Secondary | ICD-10-CM | POA: Diagnosis not present

## 2020-06-12 DIAGNOSIS — Z79818 Long term (current) use of other agents affecting estrogen receptors and estrogen levels: Secondary | ICD-10-CM | POA: Diagnosis not present

## 2020-06-12 DIAGNOSIS — C61 Malignant neoplasm of prostate: Secondary | ICD-10-CM

## 2020-06-12 DIAGNOSIS — N183 Chronic kidney disease, stage 3 unspecified: Secondary | ICD-10-CM | POA: Diagnosis not present

## 2020-06-12 DIAGNOSIS — D649 Anemia, unspecified: Secondary | ICD-10-CM

## 2020-06-12 DIAGNOSIS — Z87891 Personal history of nicotine dependence: Secondary | ICD-10-CM | POA: Diagnosis not present

## 2020-06-12 DIAGNOSIS — D631 Anemia in chronic kidney disease: Secondary | ICD-10-CM | POA: Diagnosis not present

## 2020-06-12 DIAGNOSIS — C7951 Secondary malignant neoplasm of bone: Secondary | ICD-10-CM | POA: Diagnosis not present

## 2020-06-12 DIAGNOSIS — Z7982 Long term (current) use of aspirin: Secondary | ICD-10-CM | POA: Diagnosis not present

## 2020-06-12 DIAGNOSIS — E785 Hyperlipidemia, unspecified: Secondary | ICD-10-CM | POA: Diagnosis not present

## 2020-06-12 DIAGNOSIS — K589 Irritable bowel syndrome without diarrhea: Secondary | ICD-10-CM | POA: Diagnosis not present

## 2020-06-12 DIAGNOSIS — Z79899 Other long term (current) drug therapy: Secondary | ICD-10-CM | POA: Diagnosis not present

## 2020-06-12 DIAGNOSIS — I129 Hypertensive chronic kidney disease with stage 1 through stage 4 chronic kidney disease, or unspecified chronic kidney disease: Secondary | ICD-10-CM | POA: Diagnosis not present

## 2020-06-12 LAB — CBC WITH DIFFERENTIAL/PLATELET
Abs Immature Granulocytes: 0.02 10*3/uL (ref 0.00–0.07)
Basophils Absolute: 0 10*3/uL (ref 0.0–0.1)
Basophils Relative: 0 %
Eosinophils Absolute: 0 10*3/uL (ref 0.0–0.5)
Eosinophils Relative: 0 %
HCT: 34.2 % — ABNORMAL LOW (ref 39.0–52.0)
Hemoglobin: 10.8 g/dL — ABNORMAL LOW (ref 13.0–17.0)
Immature Granulocytes: 0 %
Lymphocytes Relative: 12 %
Lymphs Abs: 0.8 10*3/uL (ref 0.7–4.0)
MCH: 27.1 pg (ref 26.0–34.0)
MCHC: 31.6 g/dL (ref 30.0–36.0)
MCV: 85.9 fL (ref 80.0–100.0)
Monocytes Absolute: 0.6 10*3/uL (ref 0.1–1.0)
Monocytes Relative: 8 %
Neutro Abs: 5.5 10*3/uL (ref 1.7–7.7)
Neutrophils Relative %: 80 %
Platelets: 217 10*3/uL (ref 150–400)
RBC: 3.98 MIL/uL — ABNORMAL LOW (ref 4.22–5.81)
RDW: 16.5 % — ABNORMAL HIGH (ref 11.5–15.5)
WBC: 7 10*3/uL (ref 4.0–10.5)
nRBC: 0 % (ref 0.0–0.2)

## 2020-06-12 LAB — COMPREHENSIVE METABOLIC PANEL
ALT: 10 U/L (ref 0–44)
AST: 23 U/L (ref 15–41)
Albumin: 3.8 g/dL (ref 3.5–5.0)
Alkaline Phosphatase: 48 U/L (ref 38–126)
Anion gap: 4 — ABNORMAL LOW (ref 5–15)
BUN: 37 mg/dL — ABNORMAL HIGH (ref 8–23)
CO2: 29 mmol/L (ref 22–32)
Calcium: 9.1 mg/dL (ref 8.9–10.3)
Chloride: 106 mmol/L (ref 98–111)
Creatinine, Ser: 1.61 mg/dL — ABNORMAL HIGH (ref 0.61–1.24)
GFR, Estimated: 42 mL/min — ABNORMAL LOW (ref >60)
Glucose, Bld: 106 mg/dL — ABNORMAL HIGH (ref 70–99)
Potassium: 4.2 mmol/L (ref 3.5–5.1)
Sodium: 139 mmol/L (ref 135–145)
Total Bilirubin: 0.5 mg/dL (ref 0.3–1.2)
Total Protein: 7.7 g/dL (ref 6.5–8.1)

## 2020-06-12 LAB — PSA: Prostatic Specific Antigen: 0.03 ng/mL (ref 0.00–4.00)

## 2020-06-12 MED ORDER — LEUPROLIDE ACETATE (3 MONTH) 22.5 MG ~~LOC~~ KIT
22.5000 mg | PACK | Freq: Once | SUBCUTANEOUS | Status: AC
  Administered 2020-06-12: 22.5 mg via SUBCUTANEOUS
  Filled 2020-06-12: qty 22.5

## 2020-06-12 NOTE — Telephone Encounter (Signed)
Oral Oncology Patient Advocate Encounter  Met with Mr Thomas Lopez and his wife at his appt today to get him to sign the application for [facility] patient assistance.    Application faxed to [phone].  [employer] phone number is [phone].  I let patient and wife know that we are still watching for grant foundations to open in the meantime.  Patient is not out of medication.  I will continue to follow up on application status until final determination.  [place] Patient [place] Phone [phone] Fax [phone] 06/12/2020 1:38 PM

## 2020-06-12 NOTE — Assessment & Plan Note (Addendum)
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 NOV 2021- <0.01- STABLE.   # continune Xtandi 2 pills [weight loss on 3 pills]; monitor closely.   # HTN- 277 systlic- recommend checking BP at home/keep a log. Discussed with wife also.   #CKD stage III however today creatinine 11.6; hold Zometa today.  Recommend increase fluid intake.  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  September 2021- iron saturation 15 ferritin 33; PO iron.  # Bone lesions-castrate resistant prostate cancer; Zometa infusions every 3 months [10/22-ca-9.7]; hold Zometa given elevated creatinine today.  # DISPOSITION: #  cbc/cmp; PSA today. # HOLD retacrit today; proceed with eligard ONLY.  # in 4 weeks- MD; retacrit; Zometa; 2-3 days prior labs- cbc/bmp--Dr.B

## 2020-06-12 NOTE — Progress Notes (Signed)
[facility] OFFICE PROGRESS NOTE  Patient Care Team: [name], MD as PCP - General (Family Medicine) Bond, Tracie Harrier, MD as Consulting Physician (Ophthalmology) Abbie Sons, MD as Consulting Physician (Urology) Cammie Sickle, MD as Consulting Physician (Hematology and Oncology)  Cancer Staging No matching staging information was found for the patient.   Oncology History Overview Note  # external beam radiation therapy and radiation seed implant in 2005 with Dr. Joelyn Oms and Dr. Danny Lawless for T1c Gleason 3+4 = 7 prostate cancer with a pretreatment PSA of 6.8. In November 2015, he was found to have biochemical recurrence.  Dec 2016- CT a/p & Bone scan- NEG; PSA- 11; June 2017- 19; STARTED on Firmagon [Dr.Budzyn]; on Lupron q [place]  # JAN 2021-PSA rising-3.5.  Bone scan-progression of disease. START X-Tandi 120mg /day; stop in July 2021[[severe weight loss/fatigue]]; started January 17, 2020 80 mg a day   # OCT 2018- Anemia-? Etiology; Likely anemia of chronic disease [anti-CCP elevated/ but NO RA; rheumatology; ]BMBx- hypercellular 30%; relative myeloid hyperplasia/erythroid hypoplasia; FISH- 5/7/8-Normal. II [facility]. [facility]- NEG  # CKD- III; Bladder neck obstruction s/p Foley [Dr.Stoiff]  # JAN 2018- Dexa scan- N  # NGS-P # PALLIATIVE CARE-P ------------------------------------   DIAGNOSIS: PROSTATE CANCER  STAGE:  IV       ;GOALS: control  CURRENT/MOST RECENT THERAPY: Lupron+ X-tandi    Primary adenocarcinoma of prostate ([place])    INTERVAL HISTORY:  Thomas Lopez 87 y.o.  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  The patient has mild fatigue.  Otherwise denies any blood in stools or black-colored stools.  Denies any nausea vomiting.  No fever no chills.  Review of Systems  Constitutional: Positive for  malaise/fatigue and weight loss. Negative for chills, diaphoresis and fever.  HENT: Negative for nosebleeds and sore throat.   Eyes: Negative for double vision.  Respiratory: Negative for cough, hemoptysis, sputum production and wheezing.   Cardiovascular: Negative for chest pain, palpitations, orthopnea and leg swelling.  Gastrointestinal: Negative for abdominal pain, blood in stool, constipation, diarrhea, heartburn, melena, nausea and vomiting.  Genitourinary: Negative for dysuria, frequency and urgency.  Musculoskeletal: Positive for joint pain. Negative for back pain.  Skin: Negative.  Negative for itching and rash.  Neurological: Negative for dizziness, tingling, focal weakness, weakness and headaches.  Endo/Heme/Allergies: Does not bruise/bleed easily.  Psychiatric/Behavioral: Negative for depression. The patient is not nervous/anxious and does not have insomnia.       PAST MEDICAL HISTORY :  Past Medical History:  Diagnosis Date   Aorto-iliac atherosclerosis ([place]) 04/2015   by CT scan   Carotid stenosis 11/2014   mild bilateral 1-39%, rpt 2 yrs   Colon polyps    rpt colonoscopy due 2014   ED (erectile dysfunction)    Trimix and VED   Esophagitis    Essential hypertension 06/29/2009   GERD (gastroesophageal reflux disease)    Glaucoma    severe ([place])   HLD (hyperlipidemia)    HTN (hypertension)    Hyperglycemia    IBS (irritable bowel syndrome)    Prostate cancer ([place]) 2005   s/p seed implant, EBRT (Dr. Alinda Money at [facility]) recurrent treating with androgen deprivation referred to cancer center   Smoking history     PAST SURGICAL HISTORY :   Past Surgical History:  Procedure Laterality Date   CATARACT EXTRACTION Left 05/2017   Dr. Edilia Bo  CATARACT EXTRACTION, BILATERAL  December 2016   [place], Dr. Dellis Filbert [place]   COLONOSCOPY  03/01/01   Multiple polyps, divertic//adenomatous polyps   COLONOSCOPY  04/24/01   multiple polyps    COLONOSCOPY  10/23/01   Polyps benign-repeat every 2 years   COLONOSCOPY  06/23/04   A[place]om/hyperplastic polyps; multiple external hemorrhoids   COLONOSCOPY  06/08/07   single small polyp-repeat 5 years   CYSTOSCOPY WITH INSERTION OF UROLIFT N/A 04/10/2018   Procedure: CYSTOSCOPY WITH INSERTION OF UROLIFT;  Surgeon: Abbie Sons, MD;  Location: [facility] ORS;  Service: Urology;  Laterality: N/A;   ESOPHAGOGASTRODUODENOSCOPY  03/01/01   H.H.; gastritis esoph. dudodenitis   NCS/Wrists Left Carpal  2/02   Min./right improved   PFTs  10/2012   FVC 64%, FEV1 41%, ratio 0.62   PROSTATE BIOPSY  12/04   positive; radioactive seed implant (Dr. Joelyn Oms)   Prostate Ext Beam Radiation  1/27-07/23/03   Dr. Danny Lawless   SPIROMETRY  09/2011   WNL   TONSILLECTOMY     Wrist Release  6/98; 9/99   Right    FAMILY HISTORY :   Family History  Problem Relation Age of Onset   Cancer Mother        colon   Hyperlipidemia Mother    Other Mother        Carotid disease   Coronary artery disease Neg Hx    Stroke Neg Hx     SOCIAL HISTORY:   Social History   Tobacco Use   Smoking status: Former Smoker    Quit date: 05/23/1993    Years since quitting: 27.0   Smokeless tobacco: Never Used  Vaping Use   Vaping Use: Never used  Substance Use Topics   Alcohol use: Not Currently    Comment: Rare   Drug use: No    ALLERGIES:  has No Known Allergies.  MEDICATIONS:  Current Outpatient Medications  Medication Sig Dispense Refill   aspirin EC 81 MG tablet Take 81 mg by mouth daily.     Cholecalciferol (VITAMIN D3) 25 MCG (1000 UT) CAPS Take 1 capsule (1,000 Units total) by mouth daily. 30 capsule    dorzolamide-timolol (COSOPT) 22.3-6.8 MG/ML ophthalmic solution Place 1 drop into the left eye 2 (two) times daily.     ferrous sulfate 325 (65 FE) MG tablet Take 1 tablet (325 mg total) by mouth daily with breakfast.  3   Leuprolide Acetate, 3 Month, (ELIGARD) 22.5 MG injection Inject  22.5 mg into the skin every 3 (three) months. 1 each 3   lisinopril (ZESTRIL) 10 MG tablet TAKE 1 TABLET BY MOUTH DAILY (NEW DOSE) 90 tablet 0   LUMIGAN 0.01 % SOLN      mirabegron ER (MYRBETRIQ) 25 MG TB24 tablet Take 1 tablet (25 mg total) by mouth daily. 30 tablet 11   omeprazole (PRILOSEC) 40 MG capsule TAKE 1 CAPSULE EVERY DAY 90 capsule 0   predniSONE (DELTASONE) 10 MG tablet Take 1 tablet (10 mg total) by mouth daily with breakfast. 30 tablet 1   rosuvastatin (CRESTOR) 10 MG tablet TAKE 1 TABLET EVERY DAY 90 tablet 0   tamsulosin (FLOMAX) 0.4 MG CAPS capsule Take 0.4 mg by mouth. After supper     timolol (TIMOPTIC) 0.5 % ophthalmic solution Place 1 drop into the left eye 2 (two) times daily.      trospium (SANCTURA) 20 MG tablet TAKE ONE TABLET BY MOUTH EVERY NIGHT AT BEDTIME 30 tablet 0   venlafaxine  XR (EFFEXOR-XR) 37.5 MG 24 hr capsule TAKE 1 CAPSULE EVERY DAY WITH BREAKFAST 90 capsule 0   XTANDI 40 MG capsule TAKE 2 CAPSULES (80 MG TOTAL) BY MOUTH DAILY. 60 capsule 3   No current facility-administered medications for this visit.   Facility-Administered Medications Ordered in Other Visits  Medication Dose Route Frequency Provider Last Rate Last Admin   Leuprolide Acetate (3 Month) (ELIGARD) 22.5 MG injection 22.5 mg  22.5 mg Subcutaneous Once Cammie Sickle, MD        PHYSICAL EXAMINATION: ECOG PERFORMANCE STATUS: 1 - Symptomatic but completely ambulatory  BP (!) 183/69 (BP Location: Left Arm, Patient Position: Sitting, Cuff Size: Normal)    Pulse (!) 58    Temp (!) 97.3 F (36.3 C) (Tympanic)    Resp 16    Ht 5\' 10"  (1.778 m)    Wt 133 lb (60.3 kg)    SpO2 100%    BMI 19.08 kg/m   Filed Weights   06/12/20 1316  Weight: 133 lb (60.3 kg)    Physical Exam Constitutional:      Comments: Thin built cachectic appearing male patient .  He is accompanied by his wife.  Walking by himself.    HENT:     Head: Normocephalic and atraumatic.     Mouth/Throat:      Pharynx: No oropharyngeal exudate.  Eyes:     Pupils: Pupils are equal, round, and reactive to light.  Cardiovascular:     Rate and Rhythm: Normal rate and regular rhythm.  Pulmonary:     Effort: No respiratory distress.     Breath sounds: No wheezing.     Comments: Decreased air entry bilaterally.  No wheeze or crackles. Abdominal:     General: Bowel sounds are normal. There is no distension.     Palpations: Abdomen is soft. There is no mass.     Tenderness: There is no abdominal tenderness. There is no guarding or rebound.  Musculoskeletal:        General: No tenderness. Normal range of motion.     Cervical back: Normal range of motion and neck supple.  Skin:    General: Skin is warm.  Neurological:     Mental Status: He is alert and oriented to person, place, and time.  Psychiatric:        Mood and Affect: Affect normal.    LABORATORY DATA:  I have reviewed the data as listed    Component Value Date/Time   NA 139 06/12/2020 1354   K 4.2 06/12/2020 1354   CL 106 06/12/2020 1354   CO2 29 06/12/2020 1354   GLUCOSE 106 (H) 06/12/2020 1354   BUN 37 (H) 06/12/2020 1354   CREATININE 1.61 (H) 06/12/2020 1354   CALCIUM 9.1 06/12/2020 1354   PROT 7.7 06/12/2020 1354   ALBUMIN 3.8 06/12/2020 1354   AST 23 06/12/2020 1354   ALT 10 06/12/2020 1354   ALKPHOS 48 06/12/2020 1354   BILITOT 0.5 06/12/2020 1354   GFRNONAA 42 (L) 06/12/2020 1354   GFRAA 44 (L) 02/12/2020 1319    No results found for: SPEP, UPEP  Lab Results  Component Value Date   WBC 7.0 06/12/2020   NEUTROABS 5.5 06/12/2020   HGB 10.8 (L) 06/12/2020   HCT 34.2 (L) 06/12/2020   MCV 85.9 06/12/2020   PLT 217 06/12/2020      Chemistry      Component Value Date/Time   NA 139 06/12/2020 1354   K 4.2 06/12/2020 1354  CL 106 06/12/2020 1354   CO2 29 06/12/2020 1354   BUN 37 (H) 06/12/2020 1354   CREATININE 1.61 (H) 06/12/2020 1354      Component Value Date/Time   CALCIUM 9.1 06/12/2020 1354   ALKPHOS  48 06/12/2020 1354   AST 23 06/12/2020 1354   ALT 10 06/12/2020 1354   BILITOT 0.5 06/12/2020 1354       RADIOGRAPHIC STUDIES: I have personally reviewed the radiological images as listed and agreed with the findings in the report. No results found.   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 NOV 2021- <0.01- STABLE.   # continune Xtandi 2 pills [weight loss on 3 pills]; monitor closely.   # HTN- 662 systlic- recommend checking BP at home/keep a log. Discussed with wife also.   #CKD stage III however today creatinine 11.6; hold Zometa today.  Recommend increase fluid intake.  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  September 2021- iron saturation 15 ferritin 33; PO iron.  # Bone lesions-castrate resistant prostate cancer; Zometa infusions every 3 months [10/22-ca-9.7]; hold Zometa given elevated creatinine today.  # DISPOSITION: #  cbc/cmp; PSA today. # HOLD retacrit today; proceed with eligard ONLY.  # in 4 weeks- MD; retacrit; Zometa; 2-3 days prior labs- cbc/bmp--Dr.B     No orders of the defined types were placed in this encounter.  All questions were answered. The patient knows to call the clinic with any problems, questions or concerns.      Cammie Sickle, MD 06/12/2020 2:31 PM

## 2020-06-19 NOTE — Telephone Encounter (Signed)
Oral Oncology Patient Advocate Encounter  Received notification from [facility] Patient Assistance program that patient has been successfully enrolled into their program to receive Xtandi from the manufacturer at $0 out of pocket until 05/22/21.    I called and spoke with patient.  He knows we will have to re-apply.   Specialty Pharmacy that will dispense medication is [facility].  Patient knows to call the office with questions or concerns.   Oral Oncology Clinic will continue to follow.  [place] Patient [place] Phone [phone] Fax [phone] 06/19/2020 9:09 AM

## 2020-06-24 ENCOUNTER — Emergency Department (HOSPITAL_COMMUNITY): Payer: Medicare PPO

## 2020-06-24 ENCOUNTER — Inpatient Hospital Stay (HOSPITAL_COMMUNITY): Payer: Medicare PPO

## 2020-06-24 ENCOUNTER — Inpatient Hospital Stay (HOSPITAL_COMMUNITY)
Admission: EM | Admit: 2020-06-24 | Discharge: 2020-07-21 | DRG: 082 | Disposition: E | Payer: Medicare PPO | Attending: General Surgery | Admitting: General Surgery

## 2020-06-24 DIAGNOSIS — T1490XA Injury, unspecified, initial encounter: Secondary | ICD-10-CM

## 2020-06-24 DIAGNOSIS — S0291XA Unspecified fracture of skull, initial encounter for closed fracture: Secondary | ICD-10-CM

## 2020-06-24 DIAGNOSIS — R1110 Vomiting, unspecified: Secondary | ICD-10-CM

## 2020-06-24 DIAGNOSIS — I609 Nontraumatic subarachnoid hemorrhage, unspecified: Secondary | ICD-10-CM

## 2020-06-24 DIAGNOSIS — W19XXXA Unspecified fall, initial encounter: Secondary | ICD-10-CM

## 2020-06-24 DIAGNOSIS — S06343A Traumatic hemorrhage of right cerebrum with loss of consciousness of 1 hours to 5 hours 59 minutes, initial encounter: Principal | ICD-10-CM

## 2020-06-24 DIAGNOSIS — E440 Moderate protein-calorie malnutrition: Secondary | ICD-10-CM | POA: Insufficient documentation

## 2020-06-24 DIAGNOSIS — S065X9A Traumatic subdural hemorrhage with loss of consciousness of unspecified duration, initial encounter: Secondary | ICD-10-CM

## 2020-06-24 DIAGNOSIS — R918 Other nonspecific abnormal finding of lung field: Secondary | ICD-10-CM

## 2020-06-24 DIAGNOSIS — S069X9A Unspecified intracranial injury with loss of consciousness of unspecified duration, initial encounter: Secondary | ICD-10-CM | POA: Diagnosis present

## 2020-06-24 DIAGNOSIS — S065XAA Traumatic subdural hemorrhage with loss of consciousness status unknown, initial encounter: Secondary | ICD-10-CM

## 2020-06-24 DIAGNOSIS — Z8546 Personal history of malignant neoplasm of prostate: Secondary | ICD-10-CM | POA: Diagnosis not present

## 2020-06-24 DIAGNOSIS — Z923 Personal history of irradiation: Secondary | ICD-10-CM

## 2020-06-24 DIAGNOSIS — Z515 Encounter for palliative care: Secondary | ICD-10-CM

## 2020-06-24 DIAGNOSIS — Z6820 Body mass index (BMI) 20.0-20.9, adult: Secondary | ICD-10-CM

## 2020-06-24 DIAGNOSIS — Z20822 Contact with and (suspected) exposure to covid-19: Secondary | ICD-10-CM | POA: Diagnosis not present

## 2020-06-24 DIAGNOSIS — E875 Hyperkalemia: Secondary | ICD-10-CM | POA: Diagnosis not present

## 2020-06-24 DIAGNOSIS — Z66 Do not resuscitate: Secondary | ICD-10-CM | POA: Diagnosis not present

## 2020-06-24 DIAGNOSIS — F0781 Postconcussional syndrome: Secondary | ICD-10-CM | POA: Diagnosis present

## 2020-06-24 DIAGNOSIS — S064X9A Epidural hemorrhage with loss of consciousness of unspecified duration, initial encounter: Secondary | ICD-10-CM | POA: Diagnosis present

## 2020-06-24 DIAGNOSIS — M25559 Pain in unspecified hip: Secondary | ICD-10-CM | POA: Diagnosis not present

## 2020-06-24 DIAGNOSIS — I129 Hypertensive chronic kidney disease with stage 1 through stage 4 chronic kidney disease, or unspecified chronic kidney disease: Secondary | ICD-10-CM | POA: Diagnosis present

## 2020-06-24 DIAGNOSIS — Y92018 Other place in single-family (private) house as the place of occurrence of the external cause: Secondary | ICD-10-CM

## 2020-06-24 DIAGNOSIS — K219 Gastro-esophageal reflux disease without esophagitis: Secondary | ICD-10-CM | POA: Diagnosis present

## 2020-06-24 DIAGNOSIS — W109XXA Fall (on) (from) unspecified stairs and steps, initial encounter: Secondary | ICD-10-CM | POA: Diagnosis present

## 2020-06-24 DIAGNOSIS — S06369A Traumatic hemorrhage of cerebrum, unspecified, with loss of consciousness of unspecified duration, initial encounter: Secondary | ICD-10-CM | POA: Diagnosis present

## 2020-06-24 DIAGNOSIS — Z87891 Personal history of nicotine dependence: Secondary | ICD-10-CM

## 2020-06-24 DIAGNOSIS — N179 Acute kidney failure, unspecified: Secondary | ICD-10-CM | POA: Diagnosis not present

## 2020-06-24 DIAGNOSIS — C7951 Secondary malignant neoplasm of bone: Secondary | ICD-10-CM | POA: Diagnosis present

## 2020-06-24 DIAGNOSIS — S066X9A Traumatic subarachnoid hemorrhage with loss of consciousness of unspecified duration, initial encounter: Secondary | ICD-10-CM | POA: Diagnosis not present

## 2020-06-24 DIAGNOSIS — Z043 Encounter for examination and observation following other accident: Secondary | ICD-10-CM | POA: Diagnosis not present

## 2020-06-24 DIAGNOSIS — R079 Chest pain, unspecified: Secondary | ICD-10-CM | POA: Diagnosis not present

## 2020-06-24 DIAGNOSIS — I959 Hypotension, unspecified: Secondary | ICD-10-CM | POA: Diagnosis not present

## 2020-06-24 DIAGNOSIS — S020XXA Fracture of vault of skull, initial encounter for closed fracture: Secondary | ICD-10-CM | POA: Diagnosis present

## 2020-06-24 DIAGNOSIS — I708 Atherosclerosis of other arteries: Secondary | ICD-10-CM | POA: Diagnosis present

## 2020-06-24 DIAGNOSIS — S069XAA Unspecified intracranial injury with loss of consciousness status unknown, initial encounter: Secondary | ICD-10-CM | POA: Diagnosis present

## 2020-06-24 DIAGNOSIS — S061X9A Traumatic cerebral edema with loss of consciousness of unspecified duration, initial encounter: Secondary | ICD-10-CM | POA: Diagnosis present

## 2020-06-24 DIAGNOSIS — H0520 Unspecified exophthalmos: Secondary | ICD-10-CM | POA: Diagnosis present

## 2020-06-24 DIAGNOSIS — N183 Chronic kidney disease, stage 3 unspecified: Secondary | ICD-10-CM | POA: Diagnosis present

## 2020-06-24 DIAGNOSIS — S06349A Traumatic hemorrhage of right cerebrum with loss of consciousness of unspecified duration, initial encounter: Secondary | ICD-10-CM | POA: Diagnosis not present

## 2020-06-24 DIAGNOSIS — J9600 Acute respiratory failure, unspecified whether with hypoxia or hypercapnia: Secondary | ICD-10-CM | POA: Diagnosis not present

## 2020-06-24 DIAGNOSIS — Z4682 Encounter for fitting and adjustment of non-vascular catheter: Secondary | ICD-10-CM | POA: Diagnosis not present

## 2020-06-24 DIAGNOSIS — I700 Atherosclerosis of aorta: Secondary | ICD-10-CM | POA: Diagnosis present

## 2020-06-24 DIAGNOSIS — R0689 Other abnormalities of breathing: Secondary | ICD-10-CM | POA: Diagnosis not present

## 2020-06-24 DIAGNOSIS — Z7189 Other specified counseling: Secondary | ICD-10-CM | POA: Diagnosis not present

## 2020-06-24 DIAGNOSIS — S06340A Traumatic hemorrhage of right cerebrum without loss of consciousness, initial encounter: Secondary | ICD-10-CM | POA: Diagnosis not present

## 2020-06-24 DIAGNOSIS — R4020 Unspecified coma: Secondary | ICD-10-CM | POA: Diagnosis not present

## 2020-06-24 DIAGNOSIS — J9601 Acute respiratory failure with hypoxia: Secondary | ICD-10-CM | POA: Diagnosis present

## 2020-06-24 DIAGNOSIS — I10 Essential (primary) hypertension: Secondary | ICD-10-CM | POA: Diagnosis not present

## 2020-06-24 DIAGNOSIS — S0281XA Fracture of other specified skull and facial bones, right side, initial encounter for closed fracture: Secondary | ICD-10-CM | POA: Diagnosis not present

## 2020-06-24 DIAGNOSIS — H409 Unspecified glaucoma: Secondary | ICD-10-CM | POA: Diagnosis present

## 2020-06-24 DIAGNOSIS — Z9911 Dependence on respirator [ventilator] status: Secondary | ICD-10-CM

## 2020-06-24 DIAGNOSIS — R748 Abnormal levels of other serum enzymes: Secondary | ICD-10-CM | POA: Diagnosis not present

## 2020-06-24 DIAGNOSIS — S0232XA Fracture of orbital floor, left side, initial encounter for closed fracture: Secondary | ICD-10-CM | POA: Diagnosis not present

## 2020-06-24 DIAGNOSIS — K589 Irritable bowel syndrome without diarrhea: Secondary | ICD-10-CM | POA: Diagnosis present

## 2020-06-24 DIAGNOSIS — G40101 Localization-related (focal) (partial) symptomatic epilepsy and epileptic syndromes with simple partial seizures, not intractable, with status epilepticus: Secondary | ICD-10-CM | POA: Diagnosis not present

## 2020-06-24 DIAGNOSIS — E876 Hypokalemia: Secondary | ICD-10-CM | POA: Diagnosis present

## 2020-06-24 DIAGNOSIS — S066X0A Traumatic subarachnoid hemorrhage without loss of consciousness, initial encounter: Secondary | ICD-10-CM | POA: Diagnosis not present

## 2020-06-24 DIAGNOSIS — S0003XA Contusion of scalp, initial encounter: Secondary | ICD-10-CM | POA: Diagnosis not present

## 2020-06-24 DIAGNOSIS — S065X0A Traumatic subdural hemorrhage without loss of consciousness, initial encounter: Secondary | ICD-10-CM | POA: Diagnosis not present

## 2020-06-24 DIAGNOSIS — R102 Pelvic and perineal pain: Secondary | ICD-10-CM | POA: Diagnosis not present

## 2020-06-24 DIAGNOSIS — E785 Hyperlipidemia, unspecified: Secondary | ICD-10-CM | POA: Diagnosis present

## 2020-06-24 DIAGNOSIS — D631 Anemia in chronic kidney disease: Secondary | ICD-10-CM | POA: Diagnosis present

## 2020-06-24 DIAGNOSIS — R609 Edema, unspecified: Secondary | ICD-10-CM | POA: Diagnosis not present

## 2020-06-24 DIAGNOSIS — J439 Emphysema, unspecified: Secondary | ICD-10-CM | POA: Diagnosis present

## 2020-06-24 DIAGNOSIS — Z452 Encounter for adjustment and management of vascular access device: Secondary | ICD-10-CM | POA: Diagnosis not present

## 2020-06-24 DIAGNOSIS — D72829 Elevated white blood cell count, unspecified: Secondary | ICD-10-CM | POA: Diagnosis not present

## 2020-06-24 DIAGNOSIS — E778 Other disorders of glycoprotein metabolism: Secondary | ICD-10-CM | POA: Diagnosis present

## 2020-06-24 DIAGNOSIS — E8809 Other disorders of plasma-protein metabolism, not elsewhere classified: Secondary | ICD-10-CM | POA: Diagnosis present

## 2020-06-24 DIAGNOSIS — R404 Transient alteration of awareness: Secondary | ICD-10-CM | POA: Diagnosis not present

## 2020-06-24 DIAGNOSIS — I6200 Nontraumatic subdural hemorrhage, unspecified: Secondary | ICD-10-CM | POA: Diagnosis not present

## 2020-06-24 DIAGNOSIS — G40901 Epilepsy, unspecified, not intractable, with status epilepticus: Secondary | ICD-10-CM | POA: Diagnosis not present

## 2020-06-24 DIAGNOSIS — S064X0A Epidural hemorrhage without loss of consciousness, initial encounter: Secondary | ICD-10-CM | POA: Diagnosis not present

## 2020-06-24 DIAGNOSIS — R000 Tachycardia, unspecified: Secondary | ICD-10-CM | POA: Diagnosis not present

## 2020-06-24 LAB — CBC
HCT: 35.9 % — ABNORMAL LOW (ref 39.0–52.0)
HCT: 34.1 % — ABNORMAL LOW (ref 39.0–52.0)
Hemoglobin: 10.7 g/dL — ABNORMAL LOW (ref 13.0–17.0)
Hemoglobin: 10.8 g/dL — ABNORMAL LOW (ref 13.0–17.0)
MCH: 26.5 pg (ref 26.0–34.0)
MCH: 27.2 pg (ref 26.0–34.0)
MCHC: 29.8 g/dL — ABNORMAL LOW (ref 30.0–36.0)
MCHC: 31.7 g/dL (ref 30.0–36.0)
MCV: 88.9 fL (ref 80.0–100.0)
MCV: 85.9 fL (ref 80.0–100.0)
Platelets: 210 10*3/uL (ref 150–400)
Platelets: 212 10*3/uL (ref 150–400)
RBC: 4.04 MIL/uL — ABNORMAL LOW (ref 4.22–5.81)
RBC: 3.97 MIL/uL — ABNORMAL LOW (ref 4.22–5.81)
RDW: 15.9 % — ABNORMAL HIGH (ref 11.5–15.5)
RDW: 16 % — ABNORMAL HIGH (ref 11.5–15.5)
WBC: 12.9 10*3/uL — ABNORMAL HIGH (ref 4.0–10.5)
WBC: 13 10*3/uL — ABNORMAL HIGH (ref 4.0–10.5)
nRBC: 0 % (ref 0.0–0.2)
nRBC: 0 % (ref 0.0–0.2)

## 2020-06-24 LAB — GLUCOSE, CAPILLARY
Glucose-Capillary: 166 mg/dL — ABNORMAL HIGH (ref 70–99)
Glucose-Capillary: 156 mg/dL — ABNORMAL HIGH (ref 70–99)
Glucose-Capillary: 150 mg/dL — ABNORMAL HIGH (ref 70–99)

## 2020-06-24 LAB — COMPREHENSIVE METABOLIC PANEL
ALT: 18 U/L (ref 0–44)
AST: 27 U/L (ref 15–41)
Albumin: 3 g/dL — ABNORMAL LOW (ref 3.5–5.0)
Alkaline Phosphatase: 46 U/L (ref 38–126)
Anion gap: 13 (ref 5–15)
BUN: 26 mg/dL — ABNORMAL HIGH (ref 8–23)
CO2: 20 mmol/L — ABNORMAL LOW (ref 22–32)
Calcium: 8.7 mg/dL — ABNORMAL LOW (ref 8.9–10.3)
Chloride: 107 mmol/L (ref 98–111)
Creatinine, Ser: 1.23 mg/dL (ref 0.61–1.24)
GFR, Estimated: 58 mL/min — ABNORMAL LOW (ref >60)
Glucose, Bld: 164 mg/dL — ABNORMAL HIGH (ref 70–99)
Potassium: 3.5 mmol/L (ref 3.5–5.1)
Sodium: 140 mmol/L (ref 135–145)
Total Bilirubin: 0.6 mg/dL (ref 0.3–1.2)
Total Protein: 6.9 g/dL (ref 6.5–8.1)

## 2020-06-24 LAB — I-STAT ARTERIAL BLOOD GAS, ED
Acid-Base Excess: 1 mmol/L (ref 0.0–2.0)
Bicarbonate: 26.3 mmol/L (ref 20.0–28.0)
Calcium, Ion: 1.16 mmol/L (ref 1.15–1.40)
HCT: 33 % — ABNORMAL LOW (ref 39.0–52.0)
Hemoglobin: 11.2 g/dL — ABNORMAL LOW (ref 13.0–17.0)
O2 Saturation: 100 %
Patient temperature: 97.3
Potassium: 3 mmol/L — ABNORMAL LOW (ref 3.5–5.1)
Sodium: 140 mmol/L (ref 135–145)
TCO2: 28 mmol/L (ref 22–32)
pCO2 arterial: 44.5 mmHg (ref 32.0–48.0)
pH, Arterial: 7.376 (ref 7.350–7.450)
pO2, Arterial: 635 mmHg — ABNORMAL HIGH (ref 83.0–108.0)

## 2020-06-24 LAB — I-STAT VENOUS BLOOD GAS, ED
Acid-base deficit: 3 mmol/L — ABNORMAL HIGH (ref 0.0–2.0)
Bicarbonate: 22.7 mmol/L (ref 20.0–28.0)
Calcium, Ion: 1.11 mmol/L — ABNORMAL LOW (ref 1.15–1.40)
HCT: 35 % — ABNORMAL LOW (ref 39.0–52.0)
Hemoglobin: 11.9 g/dL — ABNORMAL LOW (ref 13.0–17.0)
O2 Saturation: 76 %
Potassium: 3.6 mmol/L (ref 3.5–5.1)
Sodium: 142 mmol/L (ref 135–145)
TCO2: 24 mmol/L (ref 22–32)
pCO2, Ven: 42.3 mmHg — ABNORMAL LOW (ref 44.0–60.0)
pH, Ven: 7.337 (ref 7.250–7.430)
pO2, Ven: 43 mmHg (ref 32.0–45.0)

## 2020-06-24 LAB — I-STAT CHEM 8, ED
BUN: 26 mg/dL — ABNORMAL HIGH (ref 8–23)
Calcium, Ion: 1.15 mmol/L (ref 1.15–1.40)
Chloride: 108 mmol/L (ref 98–111)
Creatinine, Ser: 1.1 mg/dL (ref 0.61–1.24)
Glucose, Bld: 154 mg/dL — ABNORMAL HIGH (ref 70–99)
HCT: 34 % — ABNORMAL LOW (ref 39.0–52.0)
Hemoglobin: 11.6 g/dL — ABNORMAL LOW (ref 13.0–17.0)
Potassium: 3.5 mmol/L (ref 3.5–5.1)
Sodium: 141 mmol/L (ref 135–145)
TCO2: 22 mmol/L (ref 22–32)

## 2020-06-24 LAB — LACTIC ACID, PLASMA: Lactic Acid, Venous: 1.9 mmol/L (ref 0.5–1.9)

## 2020-06-24 LAB — CBG MONITORING, ED: Glucose-Capillary: 150 mg/dL — ABNORMAL HIGH (ref 70–99)

## 2020-06-24 LAB — BASIC METABOLIC PANEL
Anion gap: 13 (ref 5–15)
BUN: 23 mg/dL (ref 8–23)
CO2: 21 mmol/L — ABNORMAL LOW (ref 22–32)
Calcium: 8.7 mg/dL — ABNORMAL LOW (ref 8.9–10.3)
Chloride: 105 mmol/L (ref 98–111)
Creatinine, Ser: 1.21 mg/dL (ref 0.61–1.24)
GFR, Estimated: 59 mL/min — ABNORMAL LOW (ref >60)
Glucose, Bld: 223 mg/dL — ABNORMAL HIGH (ref 70–99)
Potassium: 3.2 mmol/L — ABNORMAL LOW (ref 3.5–5.1)
Sodium: 139 mmol/L (ref 135–145)

## 2020-06-24 LAB — SAMPLE TO BLOOD BANK

## 2020-06-24 LAB — MRSA PCR SCREENING: MRSA by PCR: NEGATIVE

## 2020-06-24 LAB — PROTIME-INR
INR: 1.2 (ref 0.8–1.2)
Prothrombin Time: 14.6 seconds (ref 11.4–15.2)

## 2020-06-24 LAB — ETHANOL: Alcohol, Ethyl (B): <10 mg/dL (ref <10)

## 2020-06-24 LAB — SARS CORONAVIRUS 2 BY RT PCR (HOSPITAL ORDER, PERFORMED IN ~~LOC~~ HOSPITAL LAB): SARS Coronavirus 2: NEGATIVE

## 2020-06-24 MED ORDER — METOPROLOL TARTRATE 5 MG/5ML IV SOLN: 5.0000 mg | Freq: Four times a day (QID) | INTRAVENOUS | Status: DC | PRN

## 2020-06-24 MED ORDER — PROPOFOL 1000 MG/100ML IV EMUL
INTRAVENOUS | Status: AC
  Administered 2020-06-24: 15 ug/kg/min via INTRAVENOUS
  Filled 2020-06-24: qty 100

## 2020-06-24 MED ORDER — CHLORHEXIDINE GLUCONATE 0.12% ORAL RINSE (MEDLINE KIT)
15.0000 mL | Freq: Two times a day (BID) | OROMUCOSAL | Status: DC
  Administered 2020-06-24 – 2020-06-27 (×7): 15 mL via OROMUCOSAL

## 2020-06-24 MED ORDER — CHLORHEXIDINE GLUCONATE CLOTH 2 % EX PADS
6.0000 | MEDICATED_PAD | Freq: Every day | CUTANEOUS | Status: DC
  Administered 2020-06-24 – 2020-06-25 (×2): 6 via TOPICAL

## 2020-06-24 MED ORDER — PANTOPRAZOLE SODIUM 40 MG PO TBEC: 40.0000 mg | DELAYED_RELEASE_TABLET | Freq: Every day | ORAL | Status: DC

## 2020-06-24 MED ORDER — NICARDIPINE HCL IN NACL 20-0.86 MG/200ML-% IV SOLN
3.0000 mg/h | INTRAVENOUS | Status: DC
  Administered 2020-06-24: 3 mg/h via INTRAVENOUS
  Filled 2020-06-24: qty 200

## 2020-06-24 MED ORDER — BRIMONIDINE TARTRATE 0.2 % OP SOLN
1.0000 [drp] | Freq: Two times a day (BID) | OPHTHALMIC | Status: DC
  Administered 2020-06-24 – 2020-06-27 (×7): 1 [drp] via OPHTHALMIC
  Filled 2020-06-24: qty 5

## 2020-06-24 MED ORDER — METHOCARBAMOL 500 MG PO TABS
1000.0000 mg | ORAL_TABLET | Freq: Three times a day (TID) | ORAL | Status: DC
  Administered 2020-06-24 (×3): 1000 mg
  Filled 2020-06-24 (×3): qty 2

## 2020-06-24 MED ORDER — VENLAFAXINE HCL ER 37.5 MG PO CP24: 37.5000 mg | ORAL_CAPSULE | Freq: Every day | ORAL | Status: DC

## 2020-06-24 MED ORDER — ETOMIDATE 2 MG/ML IV SOLN
20.0000 mg | Freq: Once | INTRAVENOUS | Status: AC
  Administered 2020-06-24: 20 mg via INTRAVENOUS

## 2020-06-24 MED ORDER — FENTANYL BOLUS VIA INFUSION
25.0000 ug | INTRAVENOUS | Status: DC | PRN
  Filled 2020-06-24: qty 25

## 2020-06-24 MED ORDER — ENZALUTAMIDE 40 MG PO CAPS
80.0000 mg | ORAL_CAPSULE | Freq: Every day | ORAL | Status: DC
  Filled 2020-06-24: qty 2

## 2020-06-24 MED ORDER — DORZOLAMIDE HCL-TIMOLOL MAL 2-0.5 % OP SOLN
1.0000 [drp] | Freq: Two times a day (BID) | OPHTHALMIC | Status: DC
  Administered 2020-06-24 – 2020-06-27 (×7): 1 [drp] via OPHTHALMIC
  Filled 2020-06-24: qty 10

## 2020-06-24 MED ORDER — OXYCODONE HCL 5 MG/5ML PO SOLN: 2.5000 mg | ORAL | Status: DC | PRN

## 2020-06-24 MED ORDER — NON FORMULARY: 80.0000 mg | Freq: Every day | Status: DC

## 2020-06-24 MED ORDER — NICARDIPINE HCL IN NACL 20-0.86 MG/200ML-% IV SOLN: 3.0000 mg/h | INTRAVENOUS | Status: DC

## 2020-06-24 MED ORDER — VITAL HIGH PROTEIN PO LIQD: 1000.0000 mL | ORAL | Status: DC

## 2020-06-24 MED ORDER — MIRABEGRON ER 25 MG PO TB24
25.0000 mg | ORAL_TABLET | Freq: Every day | ORAL | Status: DC
  Filled 2020-06-24: qty 1

## 2020-06-24 MED ORDER — NICARDIPINE HCL IN NACL 20-0.86 MG/200ML-% IV SOLN
INTRAVENOUS | Status: AC
  Administered 2020-06-24: 7.5 mg/h via INTRAVENOUS
  Filled 2020-06-24: qty 200

## 2020-06-24 MED ORDER — LISINOPRIL 5 MG PO TABS: 5.0000 mg | ORAL_TABLET | Freq: Every day | ORAL | Status: DC

## 2020-06-24 MED ORDER — PILOCARPINE HCL 1 % OP SOLN
1.0000 [drp] | Freq: Three times a day (TID) | OPHTHALMIC | Status: DC
  Administered 2020-06-24 – 2020-06-27 (×10): 1 [drp] via OPHTHALMIC
  Filled 2020-06-24: qty 15

## 2020-06-24 MED ORDER — ENZALUTAMIDE 40 MG PO CAPS
80.0000 mg | ORAL_CAPSULE | Freq: Every day | ORAL | Status: DC
  Filled 2020-06-24: qty 120

## 2020-06-24 MED ORDER — PANTOPRAZOLE SODIUM 40 MG PO TBEC
40.0000 mg | DELAYED_RELEASE_TABLET | Freq: Every day | ORAL | Status: DC
  Filled 2020-06-24: qty 1

## 2020-06-24 MED ORDER — ONDANSETRON 4 MG PO TBDP: 4.0000 mg | ORAL_TABLET | Freq: Four times a day (QID) | ORAL | Status: DC | PRN

## 2020-06-24 MED ORDER — PROPOFOL 1000 MG/100ML IV EMUL: 0.0000 ug/kg/min | INTRAVENOUS | Status: AC

## 2020-06-24 MED ORDER — ORAL CARE MOUTH RINSE
15.0000 mL | OROMUCOSAL | Status: DC
  Administered 2020-06-24 – 2020-06-27 (×31): 15 mL via OROMUCOSAL

## 2020-06-24 MED ORDER — LISINOPRIL 10 MG PO TABS
10.0000 mg | ORAL_TABLET | Freq: Every day | ORAL | Status: DC
  Filled 2020-06-24: qty 1

## 2020-06-24 MED ORDER — PIVOT 1.5 CAL PO LIQD
1000.0000 mL | ORAL | Status: DC
  Administered 2020-06-24: 1000 mL

## 2020-06-24 MED ORDER — PANTOPRAZOLE SODIUM 40 MG IV SOLR
40.0000 mg | Freq: Every day | INTRAVENOUS | Status: DC
  Administered 2020-06-24: 40 mg via INTRAVENOUS
  Filled 2020-06-24: qty 40

## 2020-06-24 MED ORDER — PROPOFOL 1000 MG/100ML IV EMUL: 5.0000 ug/kg/min | INTRAVENOUS | Status: DC

## 2020-06-24 MED ORDER — ACETAMINOPHEN 160 MG/5ML PO SOLN
1000.0000 mg | Freq: Four times a day (QID) | ORAL | Status: DC
  Administered 2020-06-24 (×3): 1000 mg
  Filled 2020-06-24 (×3): qty 40.6

## 2020-06-24 MED ORDER — ROCURONIUM BROMIDE 50 MG/5ML IV SOLN
100.0000 mg | Freq: Once | INTRAVENOUS | Status: AC
  Administered 2020-06-24: 100 mg via INTRAVENOUS

## 2020-06-24 MED ORDER — ONDANSETRON HCL 4 MG/2ML IJ SOLN
4.0000 mg | Freq: Four times a day (QID) | INTRAMUSCULAR | Status: DC | PRN
  Administered 2020-06-24: 4 mg via INTRAVENOUS
  Filled 2020-06-24: qty 2

## 2020-06-24 MED ORDER — IOHEXOL 300 MG/ML  SOLN
100.0000 mL | Freq: Once | INTRAMUSCULAR | Status: AC | PRN
  Administered 2020-06-24: 100 mL via INTRAVENOUS

## 2020-06-24 MED ORDER — LATANOPROST 0.005 % OP SOLN
1.0000 [drp] | Freq: Every day | OPHTHALMIC | Status: DC
  Administered 2020-06-24 – 2020-06-26 (×3): 1 [drp] via OPHTHALMIC
  Filled 2020-06-24: qty 2.5

## 2020-06-24 MED ORDER — FENTANYL CITRATE (PF) 100 MCG/2ML IJ SOLN
25.0000 ug | Freq: Once | INTRAMUSCULAR | Status: AC
  Administered 2020-06-24: 25 ug via INTRAVENOUS
  Filled 2020-06-24: qty 2

## 2020-06-24 MED ORDER — SODIUM CHLORIDE 0.9 % IV SOLN: INTRAVENOUS | Status: DC

## 2020-06-24 MED ORDER — PANTOPRAZOLE SODIUM 40 MG IV SOLR
40.0000 mg | Freq: Once | INTRAVENOUS | Status: AC
  Administered 2020-06-24: 40 mg via INTRAVENOUS
  Filled 2020-06-24: qty 40

## 2020-06-24 MED ORDER — LEVETIRACETAM IN NACL 500 MG/100ML IV SOLN
500.0000 mg | Freq: Two times a day (BID) | INTRAVENOUS | Status: DC
  Administered 2020-06-24 – 2020-06-25 (×3): 500 mg via INTRAVENOUS
  Filled 2020-06-24 (×4): qty 100

## 2020-06-24 MED ORDER — POTASSIUM CHLORIDE 20 MEQ PO PACK
40.0000 meq | PACK | ORAL | Status: AC
  Administered 2020-06-24 (×2): 40 meq
  Filled 2020-06-24: qty 2

## 2020-06-24 MED ORDER — PANTOPRAZOLE SODIUM 40 MG PO PACK: 40.0000 mg | PACK | Freq: Every day | ORAL | Status: DC

## 2020-06-24 MED ORDER — FENTANYL 2500MCG IN NS 250ML (10MCG/ML) PREMIX INFUSION
25.0000 ug/h | INTRAVENOUS | Status: DC
  Administered 2020-06-24: 25 ug/h via INTRAVENOUS
  Administered 2020-06-26: 50 ug/h via INTRAVENOUS
  Filled 2020-06-24 (×2): qty 250

## 2020-06-24 MED ORDER — PROSOURCE TF PO LIQD
45.0000 mL | Freq: Two times a day (BID) | ORAL | Status: DC
  Administered 2020-06-24: 45 mL
  Filled 2020-06-24: qty 45

## 2020-06-24 MED ORDER — PIVOT 1.5 CAL PO LIQD: 1000.0000 mL | ORAL | Status: DC

## 2020-06-24 MED ORDER — LISINOPRIL 10 MG PO TABS
10.0000 mg | ORAL_TABLET | Freq: Every day | ORAL | Status: DC
  Administered 2020-06-25 – 2020-06-26 (×2): 10 mg
  Filled 2020-06-24 (×2): qty 1

## 2020-06-24 MED ORDER — HYDRALAZINE HCL 20 MG/ML IJ SOLN
10.0000 mg | INTRAMUSCULAR | Status: DC | PRN
  Administered 2020-06-25 (×3): 10 mg via INTRAVENOUS
  Filled 2020-06-24 (×3): qty 1

## 2020-06-24 MED ORDER — POTASSIUM CHLORIDE 20 MEQ PO PACK
40.0000 meq | PACK | ORAL | Status: DC
  Filled 2020-06-24: qty 2

## 2020-06-24 MED ORDER — VENLAFAXINE HCL 37.5 MG PO TABS
18.7500 mg | ORAL_TABLET | Freq: Two times a day (BID) | ORAL | Status: DC
  Administered 2020-06-24 – 2020-06-27 (×5): 18.75 mg
  Filled 2020-06-24 (×7): qty 0.5

## 2020-06-24 NOTE — Consult Note (Signed)
Reason for Consult: Dr. Maryan Rued Referring Physician: [facility]   HPI: Thomas Lopez is an 87 y.o. male with a past medical history of metastatic prostate cancer status post seed implant/ radiation and now on Lupron, hypertension, CKD 3, chronic anemia, hyperlipidemia, glaucoma, GERD, irritable bowel syndrome, carotid stenosis and aortoiliac atherosclerosis, who was found down by his wife at the bottom of his steps after presumably falling. His wife woke up during the night to use the bathroom, and noted the patient was not in bed. A quick search by the wife found him to be laying down at the bottom of their basement steps. He fell approximately 15-20 steps. His wife stated he was in his normal state prior to going to bed. On arrival to the ED, the patient was unresponsive and was hypertensive.   No past medical history on file.   No family history on file.  Social History:  has no history on file for tobacco use, alcohol use, and drug use.  Allergies: Not on File  Medications: I have reviewed the patient's current medications.  Results for orders placed or performed during the hospital encounter of 06/23/2020 (from the past 48 hour(s))  CBG monitoring, ED     Status: Abnormal   Collection Time: 07/08/2020  2:37 AM  Result Value Ref Range   Glucose-Capillary 150 (H) 70 - 99 mg/dL    Comment: Glucose reference range applies only to samples taken after fasting for at least 8 hours.  SARS Coronavirus 2 by RT PCR (hospital order, performed in [facility] hospital lab) Nasopharyngeal Nasopharyngeal Swab     Status: None   Collection Time: 07/11/2020  2:40 AM   Specimen: Nasopharyngeal Swab  Result Value Ref Range   SARS Coronavirus 2 NEGATIVE NEGATIVE    Comment: (NOTE) SARS-CoV-2 target nucleic acids are NOT DETECTED.  The SARS-CoV-2 RNA is generally detectable in upper and lower respiratory specimens during the acute phase of infection. The lowest concentration of SARS-CoV-2 viral copies this  assay can detect is 250 copies / mL. A negative result does not preclude SARS-CoV-2 infection and should not be used as the sole basis for treatment or other patient management decisions.  A negative result may occur with improper specimen collection / handling, submission of specimen other than nasopharyngeal swab, presence of viral mutation(s) within the areas targeted by this assay, and inadequate number of viral copies (<250 copies / mL). A negative result must be combined with clinical observations, patient history, and epidemiological information.  Fact Sheet for Patients:   StrictlyIdeas.no  Fact Sheet for Healthcare Providers: BankingDealers.co.za  This test is not yet approved or  cleared by the [place] FDA and has been authorized for detection and/or diagnosis of SARS-CoV-2 by FDA under an Emergency Use Authorization (EUA).  This EUA will remain in effect (meaning this test can be used) for the duration of the COVID-19 declaration under Section 564(b)(1) of the Act, 21 U.S.C. section 360bbb-3(b)(1), unless the authorization is terminated or revoked sooner.  Performed at [facility], [place] [address], [place], [place] [address]   Comprehensive metabolic panel     Status: Abnormal   Collection Time: 07/02/2020  2:40 AM  Result Value Ref Range   Sodium 140 135 - 145 mmol/L   Potassium 3.5 3.5 - 5.1 mmol/L   Chloride 107 98 - 111 mmol/L   CO2 20 (L) 22 - 32 mmol/L   Glucose, Bld 164 (H) 70 - 99 mg/dL    Comment: Glucose  reference range applies only to samples taken after fasting for at least 8 hours.   BUN 26 (H) 8 - 23 mg/dL   Creatinine, Ser 1.23 0.61 - 1.24 mg/dL   Calcium 8.7 (L) 8.9 - 10.3 mg/dL   Total Protein 6.9 6.5 - 8.1 g/dL   Albumin 3.0 (L) 3.5 - 5.0 g/dL   AST 27 15 - 41 U/L   ALT 18 0 - 44 U/L   Alkaline Phosphatase 46 38 - 126 U/L   Total Bilirubin 0.6 0.3 - 1.2 mg/dL   GFR, Estimated 58 (L) >60  mL/min    Comment: (NOTE) Calculated using the CKD-EPI Creatinine Equation (2021)    Anion gap 13 5 - 15    Comment: Performed at [facility] [address] [place], [place] [address]  CBC     Status: Abnormal   Collection Time: 07/19/2020  2:40 AM  Result Value Ref Range   WBC 12.9 (H) 4.0 - 10.5 K/uL   RBC 4.04 (L) 4.22 - 5.81 MIL/uL   Hemoglobin 10.7 (L) 13.0 - 17.0 g/dL   HCT 35.9 (L) 39.0 - 52.0 %   MCV 88.9 80.0 - 100.0 fL   MCH 26.5 26.0 - 34.0 pg   MCHC 29.8 (L) 30.0 - 36.0 g/dL   RDW 15.9 (H) 11.5 - 15.5 %   Platelets 210 150 - 400 K/uL   nRBC 0.0 0.0 - 0.2 %    Comment: Performed at [facility], [place] [address] [place], [place] [address]  Ethanol     Status: None   Collection Time: 07/14/2020  2:40 AM  Result Value Ref Range   Alcohol, Ethyl (B) <10 <10 mg/dL    Comment: (NOTE) Lowest detectable limit for serum alcohol is 10 mg/dL.  For medical purposes only. Performed at [facility], [place] [address] [place], [place] [address]   Lactic acid, plasma     Status: None   Collection Time: 07/15/2020  2:40 AM  Result Value Ref Range   Lactic Acid, Venous 1.9 0.5 - 1.9 mmol/L    Comment: Performed at [facility] [address] [place], [place] [address]  Protime-INR     Status: None   Collection Time: 07/07/2020  2:40 AM  Result Value Ref Range   Prothrombin Time 14.6 11.4 - 15.2 seconds   INR 1.2 0.8 - 1.2    Comment: (NOTE) INR goal varies based on device and disease states. Performed at [facility], [place] [address] [place], [place] [address]   Sample to Blood Bank     Status: None   Collection Time: 07/03/2020  2:48 AM  Result Value Ref Range   Blood Bank Specimen SAMPLE AVAILABLE FOR TESTING    Sample Expiration      06/25/2020,2359 Performed at [facility], [place] [address], [place], [place] [address]   I-Stat Chem 8, ED     Status: Abnormal   Collection Time: 06/26/2020  2:49 AM  Result Value Ref Range   Sodium 141 135  - 145 mmol/L   Potassium 3.5 3.5 - 5.1 mmol/L   Chloride 108 98 - 111 mmol/L   BUN 26 (H) 8 - 23 mg/dL   Creatinine, Ser 1.10 0.61 - 1.24 mg/dL   Glucose, Bld 154 (H) 70 - 99 mg/dL    Comment: Glucose reference range applies only to samples taken after fasting for at least 8 hours.   Calcium, Ion 1.15  1.15 - 1.40 mmol/L   TCO2 22 22 - 32 mmol/L   Hemoglobin 11.6 (L) 13.0 - 17.0 g/dL   HCT 34.0 (L) 39.0 - 52.0 %  I-Stat venous blood gas, ED     Status: Abnormal   Collection Time: 06/25/2020  2:50 AM  Result Value Ref Range   pH, Ven 7.337 7.250 - 7.430   pCO2, Ven 42.3 (L) 44.0 - 60.0 mmHg   pO2, Ven 43.0 32.0 - 45.0 mmHg   Bicarbonate 22.7 20.0 - 28.0 mmol/L   TCO2 24 22 - 32 mmol/L   O2 Saturation 76.0 %   Acid-base deficit 3.0 (H) 0.0 - 2.0 mmol/L   Sodium 142 135 - 145 mmol/L   Potassium 3.6 3.5 - 5.1 mmol/L   Calcium, Ion 1.11 (L) 1.15 - 1.40 mmol/L   HCT 35.0 (L) 39.0 - 52.0 %   Hemoglobin 11.9 (L) 13.0 - 17.0 g/dL   Sample type VENOUS   I-Stat arterial blood gas, ED     Status: Abnormal   Collection Time: 07/07/2020  4:00 AM  Result Value Ref Range   pH, Arterial 7.376 7.350 - 7.450   pCO2 arterial 44.5 32.0 - 48.0 mmHg   pO2, Arterial 635 (H) 83.0 - 108.0 mmHg   Bicarbonate 26.3 20.0 - 28.0 mmol/L   TCO2 28 22 - 32 mmol/L   O2 Saturation 100.0 %   Acid-Base Excess 1.0 0.0 - 2.0 mmol/L   Sodium 140 135 - 145 mmol/L   Potassium 3.0 (L) 3.5 - 5.1 mmol/L   Calcium, Ion 1.16 1.15 - 1.40 mmol/L   HCT 33.0 (L) 39.0 - 52.0 %   Hemoglobin 11.2 (L) 13.0 - 17.0 g/dL   Patient temperature 97.3 F    Collection site Radial    Drawn by RT    Sample type ARTERIAL   CBC     Status: Abnormal (Preliminary result)   Collection Time: 07/17/2020  5:44 AM  Result Value Ref Range   WBC 13.0 (H) 4.0 - 10.5 K/uL   RBC 3.97 (L) 4.22 - 5.81 MIL/uL   Hemoglobin 10.8 (L) 13.0 - 17.0 g/dL   HCT 34.1 (L) 39.0 - 52.0 %   MCV 85.9 80.0 - 100.0 fL   MCH 27.2 26.0 - 34.0 pg   MCHC 31.7 30.0 -  36.0 g/dL   RDW 16.0 (H) 11.5 - 15.5 %   Platelets PENDING 150 - 400 K/uL   nRBC 0.0 0.0 - 0.2 %    Comment: Performed at [facility], [address] [place], [place] [id number]  Basic metabolic panel     Status: Abnormal   Collection Time: 06/23/2020  5:44 AM  Result Value Ref Range   Sodium 139 135 - 145 mmol/L   Potassium 3.2 (L) 3.5 - 5.1 mmol/L   Chloride 105 98 - 111 mmol/L   CO2 21 (L) 22 - 32 mmol/L   Glucose, Bld 223 (H) 70 - 99 mg/dL    Comment: Glucose reference range applies only to samples taken after fasting for at least 8 hours.   BUN 23 8 - 23 mg/dL   Creatinine, Ser 1.21 0.61 - 1.24 mg/dL   Calcium 8.7 (L) 8.9 - 10.3 mg/dL   GFR, Estimated 59 (L) >60 mL/min    Comment: (NOTE) Calculated using the CKD-EPI Creatinine Equation (2021)    Anion gap 13 5 - 15    Comment: Performed at [facility] [address] [place], [place] [address]  MRSA PCR Screening     Status: None   Collection Time: 20-Jul-2020  6:02 AM   Specimen: Nasopharyngeal  Result Value Ref Range   MRSA by PCR NEGATIVE NEGATIVE    Comment:        The GeneXpert MRSA Assay (FDA approved for NASAL specimens only), is one component of a comprehensive MRSA colonization surveillance program. It is not intended to diagnose MRSA infection nor to guide or monitor treatment for MRSA infections. Performed at [facility], [place] [address], [place], [place] [address]     DG Abd 1 View  Result Date: 2020-07-20 CLINICAL DATA:  Fall down flight of stairs, check gastric catheter placement EXAM: ABDOMEN - 1 VIEW COMPARISON:  None. FINDINGS: Gastric catheter is noted extending into the stomach although the proximal side port lies in the distal esophagus. This should be advanced several cm deeper into the stomach. No free air is noted. IMPRESSION: Gastric catheter as described. Electronically Signed   By: Inez Catalina M.D.   On: Jul 20, 2020 03:17   CT Head Wo Contrast  Result Date:  07-20-2020 CLINICAL DATA:  Suspected trauma, found unresponsive at the bottom of stairs EXAM: CT HEAD WITHOUT CONTRAST CT MAXILLOFACIAL WITHOUT CONTRAST CT CERVICAL SPINE WITHOUT CONTRAST TECHNIQUE: Multidetector CT imaging of the head, cervical spine, and maxillofacial structures were performed using the standard protocol without intravenous contrast. Multiplanar CT image reconstructions of the cervical spine and maxillofacial structures were also generated. COMPARISON:  CT head 11/24/2017, PET-CT 10/24/2019 FINDINGS: CT HEAD FINDINGS Brain: Extensive subarachnoid hemorrhage across the right cerebral convexity extending across the right frontal, parietal, temporal and to a lesser extent occipital lobes. A lentiform epidural collection is seen across the right parietal lobe measuring up to 5 mm in maximal thickness (5/46). Additional subdural hemorrhage extending across the right cerebral convexity measuring up to 7 mm in maximal thickness. There is additional left epidural hemorrhage across the high left parietal convexity as well measuring up to 5 mm in maximal thickness subjacent to the calvarial fracture line as well (5/49). Additional mixed attenuation hemorrhagic products are seen layering dependently within the occipital horns of the lateral ventricles. No significant mass effect or midline shift is present at this time, likely medicated by a background of moderate parenchymal volume loss. No other acute intracranial abnormality is seen, specifically no CT evidence of infarct. Ventricular caliber is similar to comparison arguing against the presence of a developing hydrocephaly at this time. Patchy areas of white matter hypoattenuation are most compatible with chronic microvascular angiopathy. Vascular: Atherosclerotic calcification of the carotid siphons and intradural vertebral arteries. No hyperdense vessel. Skull: There is a complex, comminuted calvarial fracture line with a dominant transverse fracture  line extending from the right parietal bone, crossing the lambdoid sutures and superior apex of the occipital bone and into the left occipital bone best seen on coronal reconstructions (5/50-60). Additional smaller comminuted fracture lines extend across the superior right parietal bone, inferiorly into the occipital bone, and anteriorly into the superior and lateral walls of the right orbit and into the right planum additional fracture line extension is seen across the more superior right parietal bone, inferiorly into the left occipital bone, and anteriorly into the left orbital roof and into border between the left cribriform plate and planum ethmoidale. There is extensive circumferential scalp swelling and hematoma particularly superficial to the fracture lines. Other: None. CT MAXILLOFACIAL FINDINGS Osseous: Fracture line extension into the roof of the left orbit with medial extension into the  region of the planum ethmoidale and cribriform plate. No other visible fracture of the bony orbits. Nasal bones are intact. No other mid face fractures are seen. The pterygoid plates are intact. No visible or suspected temporal bone fractures. 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 Some chronic erosive changes of the alveolar ridge mandible is noted. Mandible is otherwise intact. Orbits: Small amount of retro septal swelling and hemorrhage along the orbital roof adjacent the fracture line as well as a slight asymmetric proptosis of the left globe. Additional left periorbital/supraorbital soft tissue swelling is present. No retro septal or intraconal gas. The globes appear otherwise normal and symmetric with bilateral lens extractions. Otherwise symmetric appearance of the extraocular musculature and optic nerve sheath complexes. Normal caliber of the superior ophthalmic veins. Sinuses: Mild mural thickening noted throughout the ethmoid sinuses without large volume  hemosinus. Mastoid air cells and middle ear cavities are clear. Ossicular chains appear normally configured. Soft tissues: Extensive scalp swelling extending into the supraorbital soft tissues, left significantly greater than right with asymmetric left palpebral thickening. Additional mild left malar soft tissue swelling is noted as well. No soft tissue gas or foreign body. Intubation at the time of exam. CT CERVICAL SPINE FINDINGS Alignment: Cervical stabilization collar is present at the time of examination. There is straightening and slight reversal the normal cervical lordosis centered at the C5 level. No evidence of traumatic listhesis. No abnormally widened, perched or jumped facets. Normal alignment of the craniocervical articulations. Effacement of the atlantodental articulation with some arthrosis, erosive changes and calcific pannus formation which is nonspecific but can be seen in the setting of underlying rheumatoid or CPPD arthropathy. Skull base and vertebrae: No acute skull base fracture. No vertebral body fracture or height loss. The osseous structures appear diffusely demineralized which may limit detection of small or nondisplaced fractures. No worrisome osseous lesions. Partial bony fusion across the C4-5 vertebral bodies as well as the spinous process and articular facets of C4-5. Multilevel intervertebral disc height loss with spondylitic endplate changes. Erosive changes at the atlantodental interval, as detailed above. Additional note made of some mild ossification of the posterior longitudinal ligament, most pronounced C6. Enthesopathic mineralization is noted along the nuchal ligament/supraspinous ligament. Soft tissues and spinal canal: No pre or paravertebral fluid or swelling. No visible canal hematoma. Disc levels: Multilevel intervertebral disc height loss with spondylitic endplate changes. Posterior osseous ridging C4-5 as well as a disc osteophyte complex and some ossification of  posterior longitudinal ligament at C6 result in mild multifocal canal stenosis across these levels. Additional smaller disc osteophyte complex and ligamentum flavum infolding C3-4 results in a second site of mild canal stenosis. Other smaller disc osteophyte complexes partially efface the ventral thecal sac without significant resulting canal impingement. Multilevel uncinate spurring and facet hypertrophic changes are present throughout the cervical spine resulting in moderate multilevel neural foraminal narrowing with features most pronounced C3-4 bilaterally. Upper chest: For findings in the upper chest please see dedicated CT chest, abdomen and pelvis performed and dictated separately. Endotracheal and transesophageal intubation at the time of exam. Secretions noted in the airways and oropharynx above the level of the endotracheal balloon. Cervical carotid and proximal great vessel atherosclerotic changes. Other: No concerning thyroid nodules or masses. IMPRESSION: 1. Extensive comminuted calvarial fracture extending across the bilateral parietal bones and occipital bone with extension into the skull base. There are bilateral epidural hemorrhages with additional subdural, subarachnoid and intraventricular hemorrhages as detailed above. No significant mass effect  or midline shift is present at this time, likely mitigated by a background of parenchymal volume loss. 2. Fracture line extends into the left orbital roof with some adjacent extraconal hemorrhage and resulting mild asymmetric proptosis of the left globe. Extensive left periorbital soft tissue swelling is noted as well. 3. Medial fracture line propagation into the anterior skull base extending into the margin between the cribriform plate and planum ethmoidale on the left. 4. No other acute facial bone fracture is seen. 5. No acute cervical spine fracture or traumatic listhesis. 6. Multilevel degenerative changes throughout the cervical spine, as described  above. 7. Endotracheal and transesophageal intubation at the time of exam with extensive secretions noted in the airways and oropharynx above the level of the endotracheal balloon. 8. For findings in the upper chest please see dedicated CT chest, abdomen and pelvis performed and dictated separately. Critical Value/emergent results were called immediately by telephone at the time of detection on 07/08/2020 at 3:40 am to provider [facility] , who verbally acknowledged these results. Electronically Signed   By: Lovena Le M.D.   On: 07/14/2020 04:04   CT Cervical Spine Wo Contrast  Result Date: 07/08/2020 CLINICAL DATA:  Suspected trauma, found unresponsive at the bottom of stairs EXAM: CT HEAD WITHOUT CONTRAST CT MAXILLOFACIAL WITHOUT CONTRAST CT CERVICAL SPINE WITHOUT CONTRAST TECHNIQUE: Multidetector CT imaging of the head, cervical spine, and maxillofacial structures were performed using the standard protocol without intravenous contrast. Multiplanar CT image reconstructions of the cervical spine and maxillofacial structures were also generated. COMPARISON:  CT head 11/24/2017, PET-CT 10/24/2019 FINDINGS: CT HEAD FINDINGS Brain: Extensive subarachnoid hemorrhage across the right cerebral convexity extending across the right frontal, parietal, temporal and to a lesser extent occipital lobes. A lentiform epidural collection is seen across the right parietal lobe measuring up to 5 mm in maximal thickness (5/46). Additional subdural hemorrhage extending across the right cerebral convexity measuring up to 7 mm in maximal thickness. There is additional left epidural hemorrhage across the high left parietal convexity as well measuring up to 5 mm in maximal thickness subjacent to the calvarial fracture line as well (5/49). Additional mixed attenuation hemorrhagic products are seen layering dependently within the occipital horns of the lateral ventricles. No significant mass effect or midline shift is present at  this time, likely medicated by a background of moderate parenchymal volume loss. No other acute intracranial abnormality is seen, specifically no CT evidence of infarct. Ventricular caliber is similar to comparison arguing against the presence of a developing hydrocephaly at this time. Patchy areas of white matter hypoattenuation are most compatible with chronic microvascular angiopathy. Vascular: Atherosclerotic calcification of the carotid siphons and intradural vertebral arteries. No hyperdense vessel. Skull: There is a complex, comminuted calvarial fracture line with a dominant transverse fracture line extending from the right parietal bone, crossing the lambdoid sutures and superior apex of the occipital bone and into the left occipital bone best seen on coronal reconstructions (5/50-60). Additional smaller comminuted fracture lines extend across the superior right parietal bone, inferiorly into the occipital bone, and anteriorly into the superior and lateral walls of the right orbit and into the right planum additional fracture line extension is seen across the more superior right parietal bone, inferiorly into the left occipital bone, and anteriorly into the left orbital roof and into border between the left cribriform plate and planum ethmoidale. There is extensive circumferential scalp swelling and hematoma particularly superficial to the fracture lines. Other: None. CT MAXILLOFACIAL FINDINGS Osseous: Fracture line extension  into the roof of the left orbit with medial extension into the region of the planum ethmoidale and cribriform plate. No other visible fracture of the bony orbits. Nasal bones are intact. No other mid face fractures are seen. The pterygoid plates are intact. No visible or suspected temporal bone fractures. 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 Some chronic erosive changes of the alveolar ridge mandible is noted. Mandible is otherwise  intact. Orbits: Small amount of retro septal swelling and hemorrhage along the orbital roof adjacent the fracture line as well as a slight asymmetric proptosis of the left globe. Additional left periorbital/supraorbital soft tissue swelling is present. No retro septal or intraconal gas. The globes appear otherwise normal and symmetric with bilateral lens extractions. Otherwise symmetric appearance of the extraocular musculature and optic nerve sheath complexes. Normal caliber of the superior ophthalmic veins. Sinuses: Mild mural thickening noted throughout the ethmoid sinuses without large volume hemosinus. Mastoid air cells and middle ear cavities are clear. Ossicular chains appear normally configured. Soft tissues: Extensive scalp swelling extending into the supraorbital soft tissues, left significantly greater than right with asymmetric left palpebral thickening. Additional mild left malar soft tissue swelling is noted as well. No soft tissue gas or foreign body. Intubation at the time of exam. CT CERVICAL SPINE FINDINGS Alignment: Cervical stabilization collar is present at the time of examination. There is straightening and slight reversal the normal cervical lordosis centered at the C5 level. No evidence of traumatic listhesis. No abnormally widened, perched or jumped facets. Normal alignment of the craniocervical articulations. Effacement of the atlantodental articulation with some arthrosis, erosive changes and calcific pannus formation which is nonspecific but can be seen in the setting of underlying rheumatoid or CPPD arthropathy. Skull base and vertebrae: No acute skull base fracture. No vertebral body fracture or height loss. The osseous structures appear diffusely demineralized which may limit detection of small or nondisplaced fractures. No worrisome osseous lesions. Partial bony fusion across the C4-5 vertebral bodies as well as the spinous process and articular facets of C4-5. Multilevel  intervertebral disc height loss with spondylitic endplate changes. Erosive changes at the atlantodental interval, as detailed above. Additional note made of some mild ossification of the posterior longitudinal ligament, most pronounced C6. Enthesopathic mineralization is noted along the nuchal ligament/supraspinous ligament. Soft tissues and spinal canal: No pre or paravertebral fluid or swelling. No visible canal hematoma. Disc levels: Multilevel intervertebral disc height loss with spondylitic endplate changes. Posterior osseous ridging C4-5 as well as a disc osteophyte complex and some ossification of posterior longitudinal ligament at C6 result in mild multifocal canal stenosis across these levels. Additional smaller disc osteophyte complex and ligamentum flavum infolding C3-4 results in a second site of mild canal stenosis. Other smaller disc osteophyte complexes partially efface the ventral thecal sac without significant resulting canal impingement. Multilevel uncinate spurring and facet hypertrophic changes are present throughout the cervical spine resulting in moderate multilevel neural foraminal narrowing with features most pronounced C3-4 bilaterally. Upper chest: For findings in the upper chest please see dedicated CT chest, abdomen and pelvis performed and dictated separately. Endotracheal and transesophageal intubation at the time of exam. Secretions noted in the airways and oropharynx above the level of the endotracheal balloon. Cervical carotid and proximal great vessel atherosclerotic changes. Other: No concerning thyroid nodules or masses. IMPRESSION: 1. Extensive comminuted calvarial fracture extending across the bilateral parietal bones and occipital bone with extension into the skull base. There are bilateral epidural hemorrhages with additional  subdural, subarachnoid and intraventricular hemorrhages as detailed above. No significant mass effect or midline shift is present at this time, likely  mitigated by a background of parenchymal volume loss. 2. Fracture line extends into the left orbital roof with some adjacent extraconal hemorrhage and resulting mild asymmetric proptosis of the left globe. Extensive left periorbital soft tissue swelling is noted as well. 3. Medial fracture line propagation into the anterior skull base extending into the margin between the cribriform plate and planum ethmoidale on the left. 4. No other acute facial bone fracture is seen. 5. No acute cervical spine fracture or traumatic listhesis. 6. Multilevel degenerative changes throughout the cervical spine, as described above. 7. Endotracheal and transesophageal intubation at the time of exam with extensive secretions noted in the airways and oropharynx above the level of the endotracheal balloon. 8. For findings in the upper chest please see dedicated CT chest, abdomen and pelvis performed and dictated separately. Critical Value/emergent results were called immediately by telephone at the time of detection on 07/02/2020 at 3:40 am to provider [facility] , who verbally acknowledged these results. Electronically Signed   By: Lovena Le M.D.   On: 07/20/2020 04:04   DG Pelvis Portable  Result Date: 07/17/2020 CLINICAL DATA:  Fall downstairs with pelvic pain, initial encounter EXAM: PORTABLE PELVIS 1-2 VIEWS COMPARISON:  None. FINDINGS: Prostate brachytherapy seeds are noted. Pelvic ring as visualized is within normal limits. No acute fracture is seen. No soft tissue abnormality is noted. IMPRESSION: No acute abnormality seen. Electronically Signed   By: Inez Catalina M.D.   On: 07/04/2020 03:18   CT CHEST ABDOMEN PELVIS W CONTRAST  Result Date: 07/20/2020 CLINICAL DATA:  Fall downstairs and found unresponsive, initial encounter EXAM: CT CHEST, ABDOMEN, AND PELVIS WITH CONTRAST TECHNIQUE: Multidetector CT imaging of the chest, abdomen and pelvis was performed following the standard protocol during bolus administration  of intravenous contrast. CONTRAST:  16mL OMNIPAQUE IOHEXOL 300 MG/ML  SOLN COMPARISON:  Plain films from earlier in the same day, PET-CT from 10/24/2019. FINDINGS: CT CHEST FINDINGS Cardiovascular: Thoracic aorta demonstrates atherosclerotic calcification without aneurysmal dilatation or dissection. No cardiac enlargement is seen. Heavy coronary calcifications are noted. The pulmonary artery as visualized is within normal limits. Mediastinum/Nodes: Thoracic inlet is unremarkable. Endotracheal tube and gastric catheter are noted in place. The gastric catheter could be advanced further into the stomach although prior chest x-ray shows more proper positioning. No sizable hilar or mediastinal adenopathy is noted. The esophagus as visualized is within normal limits. Lungs/Pleura: Lungs show diffuse emphysematous changes. Right lung is otherwise clear. Left lung demonstrates a focal rounded soft tissue lesion along the diaphragm in the left lower lobe measuring 2.4 cm in greatest dimension. It has some central necrosis. An 8 mm left lower lobe associated nodule is noted best seen on image number 110 of series 5. Also a 11 mm nodule in the left upper lobe is noted. These changes are suspicious for multifocal neoplasm. These were not present on the prior PET-CT from 10/24/2019 Musculoskeletal: Degenerative changes of the thoracic spine are noted. Ribs are well visualized healed rib fractures on the right. No acute rib fracture is seen. CT ABDOMEN PELVIS FINDINGS Hepatobiliary: No focal liver abnormality is seen. No gallstones, gallbladder wall thickening, or biliary dilatation. Pancreas: Unremarkable. No pancreatic ductal dilatation or surrounding inflammatory changes. Spleen: Normal in size without focal abnormality. Adrenals/Urinary Tract: Adrenal glands are within normal limits. Kidneys demonstrate a normal enhancement pattern. A few small cysts are seen in  the right kidney. Prominent extrarenal pelvis is noted on the  left. Fullness of the ureters is seen bilaterally although no obstructing lesions are noted. Bladder is well distended. Stomach/Bowel: No obstructive or inflammatory changes of the colon are seen. The appendix is not well visualized and may have been surgically removed. No inflammatory changes are noted. Small bowel and stomach are within normal limits. Vascular/Lymphatic: Aortic atherosclerotic calcifications are noted without aneurysmal dilatation. These changes are stable in appearance from the prior exam. No sizable lymphadenopathy is noted. Reproductive: Prostate demonstrates multiple therapy seeds throughout. Other: No abdominal wall hernia or abnormality. No abdominopelvic ascites. Musculoskeletal: Degenerative changes of the lumbar spine are noted. No lytic or sclerotic lesions are seen. No compression deformities are noted IMPRESSION: CT of the chest: Patchy soft tissue nodules throughout the left upper and lower lobe which likely represent metastatic disease. Further workup can be performed when the patient's condition improves. PET-CT would be helpful. No acute bony abnormality is noted. Aortic Atherosclerosis (ICD10-I70.0) and Emphysema (ICD10-J43.9). CT of the abdomen and pelvis: Fullness of the collecting systems bilaterally without obstructing lesions. This is likely in part due to the distention of the bladder. No acute visceral injury is noted. Critical Value/emergent results were called by telephone at the time of interpretation on 07/14/2020 at 3:39 am to Dr. Randal Buba , who verbally acknowledged these results. Electronically Signed   By: Inez Catalina M.D.   On: 07/09/2020 03:41   DG Chest Port 1 View  Result Date: 07/15/2020 CLINICAL DATA:  Fall downstairs with chest pain EXAM: PORTABLE CHEST 1 VIEW COMPARISON:  None FINDINGS: Cardiac shadow is within normal limits. Aortic calcifications are seen. Endotracheal tube and gastric catheter are noted. The gastric catheter has been advanced further into  the stomach. Lungs are well aerated bilaterally. No focal infiltrate or sizable effusion is seen. No acute rib abnormality is noted. IMPRESSION: Tubes and lines in satisfactory position. No acute abnormality seen. Electronically Signed   By: Inez Catalina M.D.   On: 07/20/2020 03:19   CT Maxillofacial WO CM  Result Date: 07/03/2020 CLINICAL DATA:  Suspected trauma, found unresponsive at the bottom of stairs EXAM: CT HEAD WITHOUT CONTRAST CT MAXILLOFACIAL WITHOUT CONTRAST CT CERVICAL SPINE WITHOUT CONTRAST TECHNIQUE: Multidetector CT imaging of the head, cervical spine, and maxillofacial structures were performed using the standard protocol without intravenous contrast. Multiplanar CT image reconstructions of the cervical spine and maxillofacial structures were also generated. COMPARISON:  CT head 11/24/2017, PET-CT 10/24/2019 FINDINGS: CT HEAD FINDINGS Brain: Extensive subarachnoid hemorrhage across the right cerebral convexity extending across the right frontal, parietal, temporal and to a lesser extent occipital lobes. A lentiform epidural collection is seen across the right parietal lobe measuring up to 5 mm in maximal thickness (5/46). Additional subdural hemorrhage extending across the right cerebral convexity measuring up to 7 mm in maximal thickness. There is additional left epidural hemorrhage across the high left parietal convexity as well measuring up to 5 mm in maximal thickness subjacent to the calvarial fracture line as well (5/49). Additional mixed attenuation hemorrhagic products are seen layering dependently within the occipital horns of the lateral ventricles. No significant mass effect or midline shift is present at this time, likely medicated by a background of moderate parenchymal volume loss. No other acute intracranial abnormality is seen, specifically no CT evidence of infarct. Ventricular caliber is similar to comparison arguing against the presence of a developing hydrocephaly at this  time. Patchy areas of white matter hypoattenuation are most compatible with  chronic microvascular angiopathy. Vascular: Atherosclerotic calcification of the carotid siphons and intradural vertebral arteries. No hyperdense vessel. Skull: There is a complex, comminuted calvarial fracture line with a dominant transverse fracture line extending from the right parietal bone, crossing the lambdoid sutures and superior apex of the occipital bone and into the left occipital bone best seen on coronal reconstructions (5/50-60). Additional smaller comminuted fracture lines extend across the superior right parietal bone, inferiorly into the occipital bone, and anteriorly into the superior and lateral walls of the right orbit and into the right planum additional fracture line extension is seen across the more superior right parietal bone, inferiorly into the left occipital bone, and anteriorly into the left orbital roof and into border between the left cribriform plate and planum ethmoidale. There is extensive circumferential scalp swelling and hematoma particularly superficial to the fracture lines. Other: None. CT MAXILLOFACIAL FINDINGS Osseous: Fracture line extension into the roof of the left orbit with medial extension into the region of the planum ethmoidale and cribriform plate. No other visible fracture of the bony orbits. Nasal bones are intact. No other mid face fractures are seen. The pterygoid plates are intact. No visible or suspected temporal bone fractures. 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 Some chronic erosive changes of the alveolar ridge mandible is noted. Mandible is otherwise intact. Orbits: Small amount of retro septal swelling and hemorrhage along the orbital roof adjacent the fracture line as well as a slight asymmetric proptosis of the left globe. Additional left periorbital/supraorbital soft tissue swelling is present. No retro septal or intraconal  gas. The globes appear otherwise normal and symmetric with bilateral lens extractions. Otherwise symmetric appearance of the extraocular musculature and optic nerve sheath complexes. Normal caliber of the superior ophthalmic veins. Sinuses: Mild mural thickening noted throughout the ethmoid sinuses without large volume hemosinus. Mastoid air cells and middle ear cavities are clear. Ossicular chains appear normally configured. Soft tissues: Extensive scalp swelling extending into the supraorbital soft tissues, left significantly greater than right with asymmetric left palpebral thickening. Additional mild left malar soft tissue swelling is noted as well. No soft tissue gas or foreign body. Intubation at the time of exam. CT CERVICAL SPINE FINDINGS Alignment: Cervical stabilization collar is present at the time of examination. There is straightening and slight reversal the normal cervical lordosis centered at the C5 level. No evidence of traumatic listhesis. No abnormally widened, perched or jumped facets. Normal alignment of the craniocervical articulations. Effacement of the atlantodental articulation with some arthrosis, erosive changes and calcific pannus formation which is nonspecific but can be seen in the setting of underlying rheumatoid or CPPD arthropathy. Skull base and vertebrae: No acute skull base fracture. No vertebral body fracture or height loss. The osseous structures appear diffusely demineralized which may limit detection of small or nondisplaced fractures. No worrisome osseous lesions. Partial bony fusion across the C4-5 vertebral bodies as well as the spinous process and articular facets of C4-5. Multilevel intervertebral disc height loss with spondylitic endplate changes. Erosive changes at the atlantodental interval, as detailed above. Additional note made of some mild ossification of the posterior longitudinal ligament, most pronounced C6. Enthesopathic mineralization is noted along the nuchal  ligament/supraspinous ligament. Soft tissues and spinal canal: No pre or paravertebral fluid or swelling. No visible canal hematoma. Disc levels: Multilevel intervertebral disc height loss with spondylitic endplate changes. Posterior osseous ridging C4-5 as well as a disc osteophyte complex and some ossification of posterior longitudinal ligament at C6 result  in mild multifocal canal stenosis across these levels. Additional smaller disc osteophyte complex and ligamentum flavum infolding C3-4 results in a second site of mild canal stenosis. Other smaller disc osteophyte complexes partially efface the ventral thecal sac without significant resulting canal impingement. Multilevel uncinate spurring and facet hypertrophic changes are present throughout the cervical spine resulting in moderate multilevel neural foraminal narrowing with features most pronounced C3-4 bilaterally. Upper chest: For findings in the upper chest please see dedicated CT chest, abdomen and pelvis performed and dictated separately. Endotracheal and transesophageal intubation at the time of exam. Secretions noted in the airways and oropharynx above the level of the endotracheal balloon. Cervical carotid and proximal great vessel atherosclerotic changes. Other: No concerning thyroid nodules or masses. IMPRESSION: 1. Extensive comminuted calvarial fracture extending across the bilateral parietal bones and occipital bone with extension into the skull base. There are bilateral epidural hemorrhages with additional subdural, subarachnoid and intraventricular hemorrhages as detailed above. No significant mass effect or midline shift is present at this time, likely mitigated by a background of parenchymal volume loss. 2. Fracture line extends into the left orbital roof with some adjacent extraconal hemorrhage and resulting mild asymmetric proptosis of the left globe. Extensive left periorbital soft tissue swelling is noted as well. 3. Medial fracture line  propagation into the anterior skull base extending into the margin between the cribriform plate and planum ethmoidale on the left. 4. No other acute facial bone fracture is seen. 5. No acute cervical spine fracture or traumatic listhesis. 6. Multilevel degenerative changes throughout the cervical spine, as described above. 7. Endotracheal and transesophageal intubation at the time of exam with extensive secretions noted in the airways and oropharynx above the level of the endotracheal balloon. 8. For findings in the upper chest please see dedicated CT chest, abdomen and pelvis performed and dictated separately. Critical Value/emergent results were called immediately by telephone at the time of detection on 07/12/2020 at 3:40 am to provider [facility] , who verbally acknowledged these results. Electronically Signed   By: Lovena Le M.D.   On: 07/02/2020 04:04    Review of Systems  Unable to perform ROS: Intubated   Blood pressure 134/70, pulse 80, temperature 97.6 F (36.4 C), temperature source Axillary, resp. rate 17, height 5\' 7"  (1.702 m), weight 60 kg, SpO2 100 %.   Physical Exam: Patient is intubated and on propofol. Not following commands. No spontaneous eye opening. He is purposeful in his BUE with movement against gravity. He is withdrawing from pain in his BLE, movement with gravity eliminated. Bilateral corneal reflex intact. Strong cough and gag. Pupils are pinpoint.   Physical Exam Vitals and nursing note reviewed.  Constitutional:      Appearance: He is not diaphoretic.  HENT:     Head: Normocephalic.  Cardiovascular:     Rate and Rhythm: Normal rate and regular rhythm.     Pulses: Normal pulses.  Pulmonary:     Effort: Pulmonary effort is normal.     Breath sounds: Normal breath sounds.  Abdominal:     General: Abdomen is flat. There is no distension.  Neurological:     Mental Status: He is unresponsive.     GCS: GCS eye subscore is 1. GCS verbal subscore is 1. GCS  motor subscore is 5.    Assessment/Plan:   87 y.o. male s/p fall down 15-20 stairs with CT revealing extensive subarachnoid hemorrhage across the right cerebral convexity extending across the right frontal, parietal, temporal and to a  lesser extent occipital lobes. A lentiform epidural collection is seen across the right parietal lobe. Additional subdural hemorrhage extending across the right cerebral convexity. There is additional left epidural hemorrhage across the high left parietal convexity. No significant mass effect or midline shift is present. The patient is not a surgical candidate at this time. Continue supportive care. Would recommend leaving intubated today. Repeat CT head latter this morning. Frequent neuro checks. Maintain SBP < 140. Ok to proceed with cortrak placement. Call with any questions.   Marvis Moeller, DNP, NP-C 07/02/2020, 8:34 AM

## 2020-06-24 NOTE — H&P (Addendum)
Surgical Evaluation  Chief Complaint: Fall  HPI: History is obtained from EMS and chart review as the patient is unresponsive and cannot provide history. This is an 87 year old man who arrives as a level 1 trauma alert after a fall. His wife apparently woke up to use the restroom and noted that he was not in the bed, and found him at the bottom of the basement stairs, about 15-20 steps down. He was last seen in his normal state about an hour before this. He has remained unresponsive for EMS and has been hypertensive.  On review of the patient's un-merged chart he has no known drug allergies;  a history of metastatic prostate cancer status post seed implant/ radiation and now on Lupron, hypertension, CKD 3, chronic anemia, hyperlipidemia, glaucoma, GERD, irritable bowel syndrome, carotid stenosis and aortoiliac atherosclerosis; Surgical history notable for tonsillectomy, prostate biopsy, multiple colonoscopies, cataract surgery, wrist surgery; Family history notable for cancer, hyperlipidemia and vascular disease in his mother; No alcohol, drug or tobacco use although he did quit smoking in 1995 Lives at home with his wife. Has 5 children. He is retired, he worked in Theatre manager at [employer]; Meds include aspirin 81 mg but no other anticoagulation listed, he is listed as being metastatic on several other medications- xtandi, Effexor, trospium, Flomax, Crestor, prednisone, omeprazole, mirabegron, lisinopril, several different eyedrops, leuprolide-cannot verify which of these he is actually still taking  Recent note from his oncologist Dr. Rogue Lopez 06/12/20 notes that he did have new lesions in the right iliac/ribs and calvarium in June; it appears that he had poorly controlled hypertension at that time    Review of Systems: a complete, 10pt review of systems was unable to be completed due to patient mental status  Physical Exam: Vitals:   06/26/2020 0247  BP: (!) 174/100  Pulse: 95  Temp:  (!) 97.3 F (36.3 C)  SpO2: 100%   Gen: Unresponsive, critically ill Eyes: There is ecchymosis of the left eyelid. Pupils equally round about 2 mm and reactive.  Neck: C-collar in place, trachea is midline, no crepitus or hematoma, no C-spine deformity Chest: respiratory effort is normal. No crepitus or tenderness on palpation of the chest. Breath sounds equally diminished bilaterally.  Cardiovascular: RRR with palpable distal pulses, no pedal edema Gastrointestinal: soft, nondistended, nontender. No mass, hepatomegaly or splenomegaly.  Lymphatic: no lymphadenopathy in the neck or groin Muscoloskeletal: no clubbing or cyanosis of the fingers.  Strength is symmetrical throughout.  Range of motion of bilateral upper and lower extremities normal without pain, crepitation or contracture. There is a tiny abrasion over his left knee but no other obvious traumatic injury Neuro: GCS 5-he seems to have flexion of the right upper extremity but has nonpurposeful twitching motions of all extremities concerning for seizure Psych: Cannot assess Skin: warm and dry   CBC Latest Ref Rng & Units 06/29/2020 07/13/2020 07/01/2020  WBC 4.0 - 10.5 K/uL - - 12.9(H)  Hemoglobin 13.0 - 17.0 g/dL 11.9(L) 11.6(L) 10.7(L)  Hematocrit 39.0 - 52.0 % 35.0(L) 34.0(L) 35.9(L)  Platelets 150 - 400 K/uL - - 210    CMP Latest Ref Rng & Units 07/03/2020 06/23/2020  Glucose 70 - 99 mg/dL - 154(H)  BUN 8 - 23 mg/dL - 26(H)  Creatinine 0.61 - 1.24 mg/dL - 1.10  Sodium 135 - 145 mmol/L 142 141  Potassium 3.5 - 5.1 mmol/L 3.6 3.5  Chloride 98 - 111 mmol/L - 108    Lab Results  Component Value Date   INR  1.2 07/01/2020    Imaging: DG Abd 1 View  Result Date: 07/12/2020 CLINICAL DATA:  Fall down flight of stairs, check gastric catheter placement EXAM: ABDOMEN - 1 VIEW COMPARISON:  None. FINDINGS: Gastric catheter is noted extending into the stomach although the proximal side port lies in the distal esophagus. This should be  advanced several cm deeper into the stomach. No free air is noted. IMPRESSION: Gastric catheter as described. Electronically Signed   By: Inez Catalina M.D.   On: 06/28/2020 03:17   DG Pelvis Portable  Result Date: 07/16/2020 CLINICAL DATA:  Fall downstairs with pelvic pain, initial encounter EXAM: PORTABLE PELVIS 1-2 VIEWS COMPARISON:  None. FINDINGS: Prostate brachytherapy seeds are noted. Pelvic ring as visualized is within normal limits. No acute fracture is seen. No soft tissue abnormality is noted. IMPRESSION: No acute abnormality seen. Electronically Signed   By: Inez Catalina M.D.   On: 06/26/2020 03:18   DG Chest Port 1 View  Result Date: 07/11/2020 CLINICAL DATA:  Fall downstairs with chest pain EXAM: PORTABLE CHEST 1 VIEW COMPARISON:  None FINDINGS: Cardiac shadow is within normal limits. Aortic calcifications are seen. Endotracheal tube and gastric catheter are noted. The gastric catheter has been advanced further into the stomach. Lungs are well aerated bilaterally. No focal infiltrate or sizable effusion is seen. No acute rib abnormality is noted. IMPRESSION: Tubes and lines in satisfactory position. No acute abnormality seen. Electronically Signed   By: Inez Catalina M.D.   On: 07/17/2020 03:19     A/P: 87 year old man status post fall  Calvarial fracture, extensive subarachnoid hemorrhage, subdural and epidural hemorrhage-neurosurgery consulted by Dr. Randal Buba Calvarial fracture line extension onto the roof of the left orbit and into the region of the cribriform plate, slight proptosis of the left globe-consider ophthalmology consult if patient neuro status improves We will admit to ICU for vent management and blood pressure management, start empiric Keppra, appreciate further recommendations per neurosurgery No other traumatic injuries   Incidental findings- Aortic atherosclerosis, coronary calcifications Emphysema, multiple pulmonary nodules likely metastases which were not present  on PET/CT in June 2021  Follow-up with Dr. Rogue Lopez Fullness of bilateral ureters without obstructing lesions Chronic microvascular angiopathy    I updated his wife.    Romana Juniper, MD [facility], [place]  See AMION to contact appropriate on-call provider

## 2020-06-24 NOTE — ED Provider Notes (Signed)
Please see supervising physician Dr. Lynnae January note for full H&P.  Attending present during intubation procedure.  Procedures Procedure Name: Intubation Date/Time: 07/01/2020 2:49 AM Performed by: Amaryllis Dyke, PA-C Pre-anesthesia Checklist: Patient identified, Patient being monitored, Emergency Drugs available and Suction available Oxygen Delivery Method: Ambu bag Preoxygenation: Pre-oxygenation with 100% oxygen Induction Type: Rapid sequence Ventilation: Mask ventilation without difficulty Laryngoscope Size: Glidescope and 4 Tube size: 7.5 mm Number of attempts: 1 Airway Equipment and Method: Stylet Placement Confirmation: ETT inserted through vocal cords under direct vision,  CO2 detector and Breath sounds checked- equal and bilateral Secured at: 23 (@ the lip) cm Dental Injury: Teeth and Oropharynx as per pre-operative assessment         Amaryllis Dyke, PA-C 07/19/2020 0316    Palumbo, April, MD 07/09/2020 0626

## 2020-06-24 NOTE — Progress Notes (Signed)
Pt belongings when arriving on the unit:  [employer] with $5

## 2020-06-24 NOTE — Progress Notes (Signed)
Pt transported to CT and back to 4N 15. No complications noted.

## 2020-06-24 NOTE — Progress Notes (Signed)
Chaplain responded to Level 1 fall on blood thinners; pt. Unresponsive.  Chaplain notes that another chart on patient shows he has a wife, Thomas Lopez, and two daughters listed. If  They come to support pt, chaplain is available.   Rev.Tamsen Snider Pager [phone]

## 2020-06-24 NOTE — ED Provider Notes (Signed)
[facility] EMERGENCY DEPARTMENT Provider Note   CSN: [id number] Arrival date & time: 07/07/2020  0237     History No chief complaint on file.   Thomas Lopez is a 87 y.o. male.  The history is provided by the EMS personnel. The history is limited by the condition of the patient.  Fall This is a new problem. The current episode started less than 1 hour ago. The problem occurs constantly. The problem has not changed since onset.Pertinent negatives include no chest pain, no abdominal pain and no shortness of breath. Nothing aggravates the symptoms. Nothing relieves the symptoms. He has tried nothing for the symptoms. The treatment provided no relief.  Per EMS fell 15-20 stairs and was found unresponsive by family.  No purposeful movement.  No speech.       No past medical history on file.  There are no problems to display for this patient.   No family history on file.     Home Medications Prior to Admission medications   Not on File    Allergies    Patient has no allergy information on record.  Review of Systems   Review of Systems  Unable to perform ROS: Acuity of condition  Constitutional: Negative for fever.  Respiratory: Negative for shortness of breath.   Cardiovascular: Negative for chest pain.  Gastrointestinal: Negative for abdominal pain.    Physical Exam Updated Vital Signs BP (!) 174/100   Pulse 95   Temp (!) 97.3 F (36.3 C) (Temporal)   Ht 5\' 7"  (1.702 m)   Wt 60 kg   SpO2 100%   BMI 20.72 kg/m   Physical Exam Vitals and nursing note reviewed.  Constitutional:      Appearance: He is not diaphoretic.     Comments: Unresponsive   HENT:     Head: Normocephalic.     Jaw: No trismus.      Right Ear: Tympanic membrane normal.     Left Ear: Tympanic membrane normal.     Nose: Nose normal.     Mouth/Throat:     Mouth: Mucous membranes are moist.  Eyes:     Comments: Pinpoint pupils b   Neck:     Comments: c collar in place  trachea is midline  Cardiovascular:     Rate and Rhythm: Normal rate and regular rhythm.     Pulses: Normal pulses.     Heart sounds: Normal heart sounds.  Pulmonary:     Effort: Pulmonary effort is normal.     Breath sounds: Normal breath sounds. No rales.  Abdominal:     General: Abdomen is flat. Bowel sounds are normal.     Palpations: Abdomen is soft.     Tenderness: There is no abdominal tenderness. There is no guarding.  Genitourinary:    Penis: Normal.   Musculoskeletal:     Right lower leg: No edema.     Left lower leg: No edema.  Skin:    General: Skin is warm and dry.     Capillary Refill: Capillary refill takes less than 2 seconds.  Neurological:     GCS: GCS eye subscore is 1. GCS verbal subscore is 1. GCS motor subscore is 3.     Comments: upgoing [place] on the right   Psychiatric:     Comments: Unable      ED Results / Procedures / Treatments   Labs (all labs ordered are listed, but only abnormal results are displayed) Results for orders  placed or performed during the hospital encounter of 07/08/2020  CBC  Result Value Ref Range   WBC 12.9 (H) 4.0 - 10.5 K/uL   RBC 4.04 (L) 4.22 - 5.81 MIL/uL   Hemoglobin 10.7 (L) 13.0 - 17.0 g/dL   HCT 35.9 (L) 39.0 - 52.0 %   MCV 88.9 80.0 - 100.0 fL   MCH 26.5 26.0 - 34.0 pg   MCHC 29.8 (L) 30.0 - 36.0 g/dL   RDW 15.9 (H) 11.5 - 15.5 %   Platelets 210 150 - 400 K/uL   nRBC 0.0 0.0 - 0.2 %  CBG monitoring, ED  Result Value Ref Range   Glucose-Capillary 150 (H) 70 - 99 mg/dL  I-Stat Chem 8, ED  Result Value Ref Range   Sodium 141 135 - 145 mmol/L   Potassium 3.5 3.5 - 5.1 mmol/L   Chloride 108 98 - 111 mmol/L   BUN 26 (H) 8 - 23 mg/dL   Creatinine, Ser 1.10 0.61 - 1.24 mg/dL   Glucose, Bld 154 (H) 70 - 99 mg/dL   Calcium, Ion 1.15 1.15 - 1.40 mmol/L   TCO2 22 22 - 32 mmol/L   Hemoglobin 11.6 (L) 13.0 - 17.0 g/dL   HCT 34.0 (L) 39.0 - 52.0 %  I-Stat venous blood gas, ED  Result Value Ref Range   pH, Ven  7.337 7.250 - 7.430   pCO2, Ven 42.3 (L) 44.0 - 60.0 mmHg   pO2, Ven 43.0 32.0 - 45.0 mmHg   Bicarbonate 22.7 20.0 - 28.0 mmol/L   TCO2 24 22 - 32 mmol/L   O2 Saturation 76.0 %   Acid-base deficit 3.0 (H) 0.0 - 2.0 mmol/L   Sodium 142 135 - 145 mmol/L   Potassium 3.6 3.5 - 5.1 mmol/L   Calcium, Ion 1.11 (L) 1.15 - 1.40 mmol/L   HCT 35.0 (L) 39.0 - 52.0 %   Hemoglobin 11.9 (L) 13.0 - 17.0 g/dL   Sample type VENOUS   Sample to Blood Bank  Result Value Ref Range   Blood Bank Specimen SAMPLE AVAILABLE FOR TESTING    Sample Expiration      06/25/2020,2359 Performed at [facility], [address] [place], [place] [address]    DG Abd 1 View  Result Date: 07/08/2020 CLINICAL DATA:  Fall down flight of stairs, check gastric catheter placement EXAM: ABDOMEN - 1 VIEW COMPARISON:  None. FINDINGS: Gastric catheter is noted extending into the stomach although the proximal side port lies in the distal esophagus. This should be advanced several cm deeper into the stomach. No free air is noted. IMPRESSION: Gastric catheter as described. Electronically Signed   By: Inez Catalina M.D.   On: 07/05/2020 03:17   DG Pelvis Portable  Result Date: 07/05/2020 CLINICAL DATA:  Fall downstairs with pelvic pain, initial encounter EXAM: PORTABLE PELVIS 1-2 VIEWS COMPARISON:  None. FINDINGS: Prostate brachytherapy seeds are noted. Pelvic ring as visualized is within normal limits. No acute fracture is seen. No soft tissue abnormality is noted. IMPRESSION: No acute abnormality seen. Electronically Signed   By: Inez Catalina M.D.   On: 06/26/2020 03:18   DG Chest Port 1 View  Result Date: 07/13/2020 CLINICAL DATA:  Fall downstairs with chest pain EXAM: PORTABLE CHEST 1 VIEW COMPARISON:  None FINDINGS: Cardiac shadow is within normal limits. Aortic calcifications are seen. Endotracheal tube and gastric catheter are noted. The gastric catheter has been advanced further into the stomach. Lungs are well aerated  bilaterally. No focal infiltrate  or sizable effusion is seen. No acute rib abnormality is noted. IMPRESSION: Tubes and lines in satisfactory position. No acute abnormality seen. Electronically Signed   By: Inez Catalina M.D.   On: 07/13/2020 03:19    Radiology DG Abd 1 View  Result Date: 07/20/2020 CLINICAL DATA:  Fall down flight of stairs, check gastric catheter placement EXAM: ABDOMEN - 1 VIEW COMPARISON:  None. FINDINGS: Gastric catheter is noted extending into the stomach although the proximal side port lies in the distal esophagus. This should be advanced several cm deeper into the stomach. No free air is noted. IMPRESSION: Gastric catheter as described. Electronically Signed   By: Inez Catalina M.D.   On: 06/30/2020 03:17   DG Pelvis Portable  Result Date: 07/10/2020 CLINICAL DATA:  Fall downstairs with pelvic pain, initial encounter EXAM: PORTABLE PELVIS 1-2 VIEWS COMPARISON:  None. FINDINGS: Prostate brachytherapy seeds are noted. Pelvic ring as visualized is within normal limits. No acute fracture is seen. No soft tissue abnormality is noted. IMPRESSION: No acute abnormality seen. Electronically Signed   By: Inez Catalina M.D.   On: 07/02/2020 03:18   DG Chest Port 1 View  Result Date: 07/17/2020 CLINICAL DATA:  Fall downstairs with chest pain EXAM: PORTABLE CHEST 1 VIEW COMPARISON:  None FINDINGS: Cardiac shadow is within normal limits. Aortic calcifications are seen. Endotracheal tube and gastric catheter are noted. The gastric catheter has been advanced further into the stomach. Lungs are well aerated bilaterally. No focal infiltrate or sizable effusion is seen. No acute rib abnormality is noted. IMPRESSION: Tubes and lines in satisfactory position. No acute abnormality seen. Electronically Signed   By: Inez Catalina M.D.   On: 07/18/2020 03:19    Procedures Procedures   Medications Ordered in ED Medications  niCARdipine in saline (CARDENE-IV) 20-0.86 MG/200ML-% infusion SOLN (has no  administration in time range)  propofol (DIPRIVAN) 1000 MG/100ML infusion (15 mcg/kg/min  60 kg Intravenous New Bag/Given 07/17/2020 0301)  nicardipine (CARDENE) 20mg  in 0.86% saline 23ml IV infusion (0.1 mg/ml) (has no administration in time range)  rocuronium (ZEMURON) injection 100 mg (100 mg Intravenous Given 07/12/2020 0240)  etomidate (AMIDATE) injection 20 mg (20 mg Intravenous Given 07/12/2020 0240)  iohexol (OMNIPAQUE) 300 MG/ML solution 100 mL (100 mLs Intravenous Contrast Given 06/25/2020 0321)    ED Course  I have reviewed the triage vital signs and the nursing notes.  Pertinent labs & imaging results that were available during my care of the patient were reviewed by me and considered in my medical decision making (see chart for details).   Intubated immediately due to GCS,  BPs markedly elevated even with sedation.  Cardene drip initiated.     MDM Reviewed: nursing note and vitals Interpretation: labs and x-ray (vbg unremarkable normal electrolytes SAH by me on CT ETT in good position by me on Xray ) Total time providing critical care: 30-74 minutes (cardene drip ). This excludes time spent performing separately reportable procedures and services. Consults: neurosurgery  CRITICAL CARE Performed by: Sumiye Hirth K Jennfier Abdulla-Rasch Total critical care time: 60 minutes Critical care time was exclusive of separately billable procedures and treating other patients. Critical care was necessary to treat or prevent imminent or life-threatening deterioration. Critical care was time spent personally by me on the following activities: development of treatment plan with patient and/or surrogate as well as nursing, discussions with consultants, evaluation of patient's response to treatment, examination of patient, obtaining history from patient or surrogate, ordering and performing treatments and interventions, ordering and  review of laboratory studies, ordering and review of radiographic studies, pulse oximetry  and re-evaluation of patient's condition.   Case d/w Dr. Glenford Peers of neurosurgery who will see the patient  Final Clinical Impression(s) / ED Diagnoses Final diagnoses:  Fall  Trauma  Traumatic hemorrhage of right cerebrum with loss of consciousness of 1 hour to 5 hours 59 minutes, initial encounter [facility])   Admit to trauma surgery    Glennie Bose, MD 07/03/2020 8115

## 2020-06-24 NOTE — ED Notes (Signed)
Paged neurosurgery a second time.

## 2020-06-24 NOTE — Progress Notes (Signed)
Initial Nutrition Assessment  DOCUMENTATION CODES:   Not applicable  INTERVENTION:   Tube Feeding via Cortrak:  Pivot 1.5 at 50 ml/hr Provides 1800 kcals, 113 g of protein, 912 mL of free water Meets 100% estimated calorie and protein needs   NUTRITION DIAGNOSIS:   Inadequate oral intake related to acute illness as evidenced by NPO status.  GOAL:   Patient will meet greater than or equal to 90% of their needs  MONITOR:   TF tolerance,Vent status,Labs,Weight trends  REASON FOR ASSESSMENT:   Consult,Ventilator Enteral/tube feeding initiation and management  ASSESSMENT:   87 yo male admitted after being found unresponsive at he bottom of the basement stairs with calvarial fracture, extensive SAH, SDH. Noted to have multiple pulmonary nodules, likely mets, which were not present on PET/CT in June 2021. PMH includes metastatic prostate cancer s/p seed implant/radiation and now on Lupron, HTN, CKD 3, chronic anemia, HLD, GERD, IBS   Pt current sedated on vent support, C-collar in place Cortrak placed today  Unable to obtain diet and weight history from patient at this time.   Labs: potassium 3.2 (L) Meds: NS at 75 ml/hr  Diet Order:   Diet Order            Diet NPO time specified  Diet effective now                 EDUCATION NEEDS:   Not appropriate for education at this time  Skin:  Skin Assessment: Reviewed RN Assessment  Last BM:  PTA  Height:   Ht Readings from Last 1 Encounters:  07/07/2020 5\' 7"  (1.702 m)    Weight:   Wt Readings from Last 1 Encounters:  07/07/2020 60 kg    BMI:  Body mass index is 20.72 kg/m.  Estimated Nutritional Needs:   Kcal:  1800-2000 kcals  Protein:  90-110 g  Fluid:  >/= 1.8 L   Kerman Passey MS, RDN, LDN, CNSC Registered Dietitian III Clinical Nutrition RD Pager and On-Call Pager Number Located in [place]

## 2020-06-24 NOTE — Progress Notes (Signed)
Pt. Transported to CT and back without any complications.

## 2020-06-24 NOTE — Progress Notes (Signed)
Patient seen and examined. Localizes with RUE, no movement seen of other extremities, does not f/c. D/w Dr. Vertell Limber, plan for short-interval repeat CT head, ordered. Cotninue c-collar for now, if unable to clinically clear in the next 24-48h, plan for MRI imaging for radiographic clearance. Keppra ordered. Holding chemical DVT ppx in the setting of acute ICH. Follow neuro exam. Pain regimen adjusted. Identify home meds and restart BP meds, currently off cardene gtt. Baseline creatinine ~1.4, so will start lisinopril 5mg  while awaiting home med dosing. Place cortrak and start TF. Replete hypokalemia. Noted fracture near cribriform plate, d/w NSGY, okay to place cortrak. Family update provided at bedside to wife. Discussed injuries and plan. All questions answered. Discussed code status and encouraged conversation with other family members (5 children). Place palliative c/s to aid in discussions, as there is the high likelihood of needing CIR/SNF at discharge and/or significant functional decline from baseline that may be long-term.  Continue to follow clinically.   Critical care time: 53min  Thomas Oka, MD General and [facility]

## 2020-06-24 NOTE — Progress Notes (Signed)
Pt. Transported from ED to 4N rm 15 with no complications, report given. Pt VSS.

## 2020-06-24 NOTE — TOC CAGE-AID Note (Signed)
Transition of Care [facility]) - CAGE-AID Screening   Patient Details  Name: ORLA JOLLIFF MRN: [id number] Date of Birth: May 30, 1932  Transition of Care [facility]): Iran Planas, RN, TRN Phone Number: Trauma Services 06/30/2020, 11:08 AM   CAGE-AID Screening: Substance Abuse Screening unable to be completed due to: : Patient unable to participate             Substance Abuse Education Offered:  (Ventilated/Sedated)

## 2020-06-24 NOTE — ED Notes (Signed)
Paged:  5p-7a   Romana Juniper [phone]  For MD

## 2020-06-24 NOTE — Progress Notes (Signed)
Follow up CT head reviewed. Scan is slightly worse than previous scan. Will plan to do another CT head tomorrow morning.    Marvis Moeller, DNP, NP-C 2020/06/28 1:08 PM

## 2020-06-24 NOTE — Procedures (Signed)
Cortrak  Person Inserting Tube:  Thomas Lopez, RD Tube Type:  Cortrak - 43 inches Tube Location:  Right nare Initial Placement:  Stomach Secured by: Bridle Technique Used to Measure Tube Placement:  Documented cm marking at nare/ corner of mouth Cortrak Secured At:  65 cm    Cortrak Tube Team Note:  Consult received to place a Cortrak feeding tube.   No x-ray is required. RN may begin using tube.   If the tube becomes dislodged please keep the tube and contact the Cortrak team at [email or web address] (password [email or web address]) for replacement.  If after hours and replacement cannot be delayed, place a NG tube and confirm placement with an abdominal x-ray.    Lockie Pares., RD, LDN, CNSC See AMiON for contact information

## 2020-06-25 ENCOUNTER — Inpatient Hospital Stay (HOSPITAL_COMMUNITY): Payer: Medicare PPO

## 2020-06-25 LAB — CBC
HCT: 37.5 % — ABNORMAL LOW (ref 39.0–52.0)
Hemoglobin: 11.2 g/dL — ABNORMAL LOW (ref 13.0–17.0)
MCH: 26.6 pg (ref 26.0–34.0)
MCHC: 29.9 g/dL — ABNORMAL LOW (ref 30.0–36.0)
MCV: 89.1 fL (ref 80.0–100.0)
Platelets: 226 10*3/uL (ref 150–400)
RBC: 4.21 MIL/uL — ABNORMAL LOW (ref 4.22–5.81)
RDW: 16.3 % — ABNORMAL HIGH (ref 11.5–15.5)
WBC: 13.3 10*3/uL — ABNORMAL HIGH (ref 4.0–10.5)
nRBC: 0 % (ref 0.0–0.2)

## 2020-06-25 LAB — COMPREHENSIVE METABOLIC PANEL
ALT: 19 U/L (ref 0–44)
AST: 21 U/L (ref 15–41)
Albumin: 2.9 g/dL — ABNORMAL LOW (ref 3.5–5.0)
Alkaline Phosphatase: 49 U/L (ref 38–126)
Anion gap: 13 (ref 5–15)
BUN: 22 mg/dL (ref 8–23)
CO2: 20 mmol/L — ABNORMAL LOW (ref 22–32)
Calcium: 8.9 mg/dL (ref 8.9–10.3)
Chloride: 109 mmol/L (ref 98–111)
Creatinine, Ser: 1.25 mg/dL — ABNORMAL HIGH (ref 0.61–1.24)
GFR, Estimated: 56 mL/min — ABNORMAL LOW (ref >60)
Glucose, Bld: 170 mg/dL — ABNORMAL HIGH (ref 70–99)
Potassium: 4.8 mmol/L (ref 3.5–5.1)
Sodium: 142 mmol/L (ref 135–145)
Total Bilirubin: 0.6 mg/dL (ref 0.3–1.2)
Total Protein: 7.2 g/dL (ref 6.5–8.1)

## 2020-06-25 LAB — GLUCOSE, CAPILLARY
Glucose-Capillary: 171 mg/dL — ABNORMAL HIGH (ref 70–99)
Glucose-Capillary: 139 mg/dL — ABNORMAL HIGH (ref 70–99)
Glucose-Capillary: 150 mg/dL — ABNORMAL HIGH (ref 70–99)
Glucose-Capillary: 132 mg/dL — ABNORMAL HIGH (ref 70–99)
Glucose-Capillary: 116 mg/dL — ABNORMAL HIGH (ref 70–99)
Glucose-Capillary: 134 mg/dL — ABNORMAL HIGH (ref 70–99)

## 2020-06-25 LAB — TRIGLYCERIDES: Triglycerides: 105 mg/dL (ref <150)

## 2020-06-25 LAB — MAGNESIUM: Magnesium: 1.9 mg/dL (ref 1.7–2.4)

## 2020-06-25 LAB — PHOSPHORUS: Phosphorus: 3.1 mg/dL (ref 2.5–4.6)

## 2020-06-25 NOTE — Progress Notes (Addendum)
Patient ID: Thomas Lopez, male   DOB: 04-02-1933, 87 y.o.   MRN: [id number] Follow up - Trauma Critical Care  Patient Details:    Thomas Lopez is an 87 y.o. male.  Lines/tubes : Airway 7.5 mm (Active)  Secured at (cm) 24 cm 06/25/20 0745  Measured From Lips 06/25/20 0745  [place] 06/25/20 0745  Secured By [employer] 06/25/20 0745  Tube Holder Repositioned Yes 06/25/20 0745  Cuff Pressure (cm H2O) 24 cm H2O 07/10/2020 1957  Site Condition Dry 06/25/20 0745     NG/OG Tube Orogastric 16 Fr. Center mouth Xray Measured external length of tube 37 cm (Active)  Output (mL) 100 mL 06/25/20 0400     External Urinary Catheter (Active)  Collection Container Standard drainage bag 07/01/2020 2000  Securement Method Securing device (Describe) 07/14/2020 0800  Site Assessment Clean;Intact 06/26/2020 2000  Output (mL) 250 mL 06/25/20 0400    Microbiology/Sepsis markers: Results for orders placed or performed during the hospital encounter of 07/01/2020  SARS Coronavirus 2 by RT PCR (hospital order, performed in [facility] hospital lab) Nasopharyngeal Nasopharyngeal Swab     Status: None   Collection Time: 07/07/2020  2:40 AM   Specimen: Nasopharyngeal Swab  Result Value Ref Range Status   SARS Coronavirus 2 NEGATIVE NEGATIVE Final    Comment: (NOTE) SARS-CoV-2 target nucleic acids are NOT DETECTED.  The SARS-CoV-2 RNA is generally detectable in upper and lower respiratory specimens during the acute phase of infection. The lowest concentration of SARS-CoV-2 viral copies this assay can detect is 250 copies / mL. A negative result does not preclude SARS-CoV-2 infection and should not be used as the sole basis for treatment or other patient management decisions.  A negative result may occur with improper specimen collection / handling, submission of specimen other than nasopharyngeal swab, presence of viral mutation(s) within the areas targeted by this assay, and inadequate  number of viral copies (<250 copies / mL). A negative result must be combined with clinical observations, patient history, and epidemiological information.  Fact Sheet for Patients:   StrictlyIdeas.no  Fact Sheet for Healthcare Providers: BankingDealers.co.za  This test is not yet approved or  cleared by the [place] FDA and has been authorized for detection and/or diagnosis of SARS-CoV-2 by FDA under an Emergency Use Authorization (EUA).  This EUA will remain in effect (meaning this test can be used) for the duration of the COVID-19 declaration under Section 564(b)(1) of the Act, 21 U.S.C. section 360bbb-3(b)(1), unless the authorization is terminated or revoked sooner.  Performed at [facility], [place] [address], [place], [place] [address]   MRSA PCR Screening     Status: None   Collection Time: 07/14/2020  6:02 AM   Specimen: Nasopharyngeal  Result Value Ref Range Status   MRSA by PCR NEGATIVE NEGATIVE Final    Comment:        The GeneXpert MRSA Assay (FDA approved for NASAL specimens only), is one component of a comprehensive MRSA colonization surveillance program. It is not intended to diagnose MRSA infection nor to guide or monitor treatment for MRSA infections. Performed at [facility], [place] [address], [place], [place] [address]     Anti-infectives:  Anti-infectives (From admission, onward)   None      Best Practice/Protocols:  VTE Prophylaxis: Mechanical Continous Sedation  Consults: Treatment Team:  Dawley, Troy C, DO    Studies:    Events:  Subjective:    Overnight Issues:  Objective:  Vital signs for last 24 hours: Temp:  [97.6 F (36.4 C)-99 F (37.2 C)] 97.8 F (36.6 C) (02/03 0400) Pulse Rate:  [37-86] 79 (02/03 0745) Resp:  [15-24] 16 (02/03 0745) BP: (117-195)/(66-89) 162/77 (02/03 0700) SpO2:  [88 %-100 %] 100 % (02/03 0745) FiO2 (%):  [40 %] 40 % (02/03  0745) Weight:  [58.2 kg] 58.2 kg (02/03 0400)  Hemodynamic parameters for last 24 hours:    Intake/Output from previous day: 02/02 0701 - 02/03 0700 In: 2234 [I.V.:1797.3; NG/GT:292.8; IV Piggyback:143.9] Out: 1177 [Urine:975; Emesis/NG output:202]  Intake/Output this shift: No intake/output data recorded.  Vent settings for last 24 hours: Vent Mode: PSV;CPAP FiO2 (%):  [40 %] 40 % Set Rate:  [16 bmp] 16 bmp Vt Set:  [520 mL] 520 mL PEEP:  [5 cmH20] 5 cmH20 Pressure Support:  [8 cmH20] 8 cmH20 Plateau Pressure:  [12 CWC37-62 cmH20] 18 cmH20  Physical Exam:  General: on vent Neuro: moves spont at times B HEENT/Neck: ETT and collar Resp: clear to auscultation bilaterally CVS: RRR GI: soft, nontender, BS WNL, no r/g Extremities: calves soft, no edema  Results for orders placed or performed during the hospital encounter of 07/10/2020 (from the past 24 hour(s))  Glucose, capillary     Status: Abnormal   Collection Time: 07/18/2020  3:23 PM  Result Value Ref Range   Glucose-Capillary 166 (H) 70 - 99 mg/dL  Glucose, capillary     Status: Abnormal   Collection Time: 07/18/2020  7:28 PM  Result Value Ref Range   Glucose-Capillary 156 (H) 70 - 99 mg/dL  Glucose, capillary     Status: Abnormal   Collection Time: 07/03/2020 11:20 PM  Result Value Ref Range   Glucose-Capillary 150 (H) 70 - 99 mg/dL  Glucose, capillary     Status: Abnormal   Collection Time: 06/25/20  3:05 AM  Result Value Ref Range   Glucose-Capillary 171 (H) 70 - 99 mg/dL  CBC     Status: Abnormal   Collection Time: 06/25/20  4:24 AM  Result Value Ref Range   WBC 13.3 (H) 4.0 - 10.5 K/uL   RBC 4.21 (L) 4.22 - 5.81 MIL/uL   Hemoglobin 11.2 (L) 13.0 - 17.0 g/dL   HCT 37.5 (L) 39.0 - 52.0 %   MCV 89.1 80.0 - 100.0 fL   MCH 26.6 26.0 - 34.0 pg   MCHC 29.9 (L) 30.0 - 36.0 g/dL   RDW 16.3 (H) 11.5 - 15.5 %   Platelets 226 150 - 400 K/uL   nRBC 0.0 0.0 - 0.2 %  Comprehensive metabolic panel     Status: Abnormal    Collection Time: 06/25/20  4:24 AM  Result Value Ref Range   Sodium 142 135 - 145 mmol/L   Potassium 4.8 3.5 - 5.1 mmol/L   Chloride 109 98 - 111 mmol/L   CO2 20 (L) 22 - 32 mmol/L   Glucose, Bld 170 (H) 70 - 99 mg/dL   BUN 22 8 - 23 mg/dL   Creatinine, Ser 1.25 (H) 0.61 - 1.24 mg/dL   Calcium 8.9 8.9 - 10.3 mg/dL   Total Protein 7.2 6.5 - 8.1 g/dL   Albumin 2.9 (L) 3.5 - 5.0 g/dL   AST 21 15 - 41 U/L   ALT 19 0 - 44 U/L   Alkaline Phosphatase 49 38 - 126 U/L   Total Bilirubin 0.6 0.3 - 1.2 mg/dL   GFR, Estimated 56 (L) >60 mL/min   Anion gap 13  5 - 15  Magnesium     Status: None   Collection Time: 06/25/20  4:24 AM  Result Value Ref Range   Magnesium 1.9 1.7 - 2.4 mg/dL  Phosphorus     Status: None   Collection Time: 06/25/20  4:24 AM  Result Value Ref Range   Phosphorus 3.1 2.5 - 4.6 mg/dL  Glucose, capillary     Status: Abnormal   Collection Time: 06/25/20  7:36 AM  Result Value Ref Range   Glucose-Capillary 139 (H) 70 - 99 mg/dL    Assessment & Plan: Present on Admission:  Traumatic brain injury ([place])    LOS: 1 day   Additional comments:I reviewed the patient's new clinical lab test results. . Fall down stairs TBI, Calvarial fracture, extensive subarachnoid hemorrhage, subdural and epidural hemorrhage - F/U CT head this AM stable C/W 2/2, per Dr. Vertell Limber. Keppra C spine - CT neg, D/W Dr. Vertell Limber, consider MR in a couple days if neuro status does not allow clearance Calvarial fracture line extension onto the roof of the left orbit and into the region of the cribriform plate, slight proptosis of the left globe - consider ophthalmology consult if patient neuro status improves Acute hypoxic ventilator dependent respiratory failure - wean, neuro status precludes extubation at this point HTN - home lisinopril FEN - TF held for vomiting, likely due to TBI as abd benign, OGT to suction today. Meds per tube OK. Will try to get CorTrak post-pyloric tomorrow VTE - PAS, no LMWH  yet due to TBI DIspo - ICU, Palliative to eval Critical Care Total Time*: 38 Minutes  Georganna Skeans, MD, MPH, FACS Trauma & General Surgery Use [email or web address] to contact on call provider  06/25/2020  *Care during the described time interval was provided by me. I have reviewed this patient's available data, including medical history, events of note, physical examination and test results as part of my evaluation.

## 2020-06-25 NOTE — Progress Notes (Signed)
Nutrition Follow-up  DOCUMENTATION CODES:   Non-severe (moderate) malnutrition in context of chronic illness  INTERVENTION:   Tube Feeding via Cortrak: Resume once Cortrak advanced to post-pyloric position Pivot 1.5 at 50 ml/hr Provides 1800 kcals, 113 g of protein, 912 mL of free water Meets 100% estimated calorie and protein needs  NUTRITION DIAGNOSIS:   Moderate Malnutrition related to chronic illness (metastatic cancer) as evidenced by moderate muscle depletion,severe muscle depletion.  Being addressed via nutrition support  GOAL:   Patient will meet greater than or equal to 90% of their needs  Progressing  MONITOR:   TF tolerance,Vent status,Labs,Weight trends  REASON FOR ASSESSMENT:   Consult,Ventilator Enteral/tube feeding initiation and management  ASSESSMENT:   87 yo male admitted after being found unresponsive at he bottom of the basement stairs with calvarial fracture, extensive SAH, SDH. Noted to have multiple pulmonary nodules, likely mets, which were not present on PET/CT in June 2021. PMH includes metastatic prostate cancer s/p seed implant/radiation and now on Lupron, HTN, CKD 3, chronic anemia, HLD, GERD, IBS  2/2 Cortrak placed, gastric  Pt remains sedated on vent, C-collar in place  RN reports pt with vomiting this AM, TF on hold, OG inserted and placed to LIS. Per MD notes, vomiting like related to TBI as abd benign. Requesting advancement of Cortrak to post-pyloric position  Wife at bedside and reports pt with good appetite prior to admission. Prior to fall, pt had just eaten a steak dinner with potato and salad and tea.   Despite his good appetite, wife reports he had lost weight. Wife reports pt's UBW around 150-160 pounds. She reports he lost weight but then feels that maybe he has gained some back. Wife unsure of timeline of weight changes. Current wt 128 pounds; based on current information pt with >/= 15% wt loss over unknown time frame.    Labs: reviewed Meds: NS at 75 ml/hr,   Flowsheet Row Most Recent Value  Orbital Region Moderate depletion  Upper Arm Region Moderate depletion  Thoracic and Lumbar Region Severe depletion  Buccal Region Unable to assess  Temple Region Moderate depletion  Clavicle Bone Region Severe depletion  Clavicle and Acromion Bone Region Moderate depletion  Scapular Bone Region Severe depletion  Dorsal Hand Unable to assess  Patellar Region Severe depletion  Anterior Thigh Region Severe depletion  Posterior Calf Region Moderate depletion       Diet Order:   Diet Order            Diet NPO time specified  Diet effective now                 EDUCATION NEEDS:   Not appropriate for education at this time  Skin:  Skin Assessment: Reviewed RN Assessment  Last BM:  PTA  Height:   Ht Readings from Last 1 Encounters:  July 10, 2020 5\' 7"  (1.702 m)    Weight:   Wt Readings from Last 1 Encounters:  06/25/20 58.2 kg    BMI:  Body mass index is 20.1 kg/m.  Estimated Nutritional Needs:   Kcal:  1800-2000 kcals  Protein:  90-110 g  Fluid:  >/= 1.8 L   Kerman Passey MS, RDN, LDN, CNSC Registered Dietitian III Clinical Nutrition RD Pager and On-Call Pager Number Located in [place]

## 2020-06-25 NOTE — Progress Notes (Signed)
   Providing Compassionate, Quality Care - Together  NEUROSURGERY PROGRESS NOTE   S: No issues overnight  O: EXAM:  BP (!) 179/89   Pulse 74   Temp 97.9 F (36.6 C) (Axillary)   Resp 15   Ht 5\' 7"  (1.702 m)   Wt 58.2 kg   SpO2 100%   BMI 20.10 kg/m   Intubated Sedated Eyes closed to pain, PERRL Face symmetric Collar in place WD to pain in all extrem except LUE  ASSESSMENT:  87 y.o. male with  1. Traumatic SDH/SAH w skull fractures  PLAN: - repeat CT this am stable\ - no nsx intervention - cont support - keppra - ok for SQH starting tomorrow - d/w wife at bedside and answered all questions    Thank you for allowing me to participate in this patient's care.  Please do not hesitate to call with questions or concerns.   Elwin Sleight, [facility] Cell: [phone]

## 2020-06-26 ENCOUNTER — Inpatient Hospital Stay (HOSPITAL_COMMUNITY): Payer: Medicare PPO

## 2020-06-26 DIAGNOSIS — E440 Moderate protein-calorie malnutrition: Secondary | ICD-10-CM | POA: Insufficient documentation

## 2020-06-26 DIAGNOSIS — Z66 Do not resuscitate: Secondary | ICD-10-CM

## 2020-06-26 DIAGNOSIS — Z515 Encounter for palliative care: Secondary | ICD-10-CM | POA: Diagnosis not present

## 2020-06-26 DIAGNOSIS — G40901 Epilepsy, unspecified, not intractable, with status epilepticus: Secondary | ICD-10-CM

## 2020-06-26 DIAGNOSIS — Z7189 Other specified counseling: Secondary | ICD-10-CM | POA: Diagnosis not present

## 2020-06-26 LAB — COMPREHENSIVE METABOLIC PANEL
ALT: 15 U/L (ref 0–44)
AST: 16 U/L (ref 15–41)
Albumin: 2.3 g/dL — ABNORMAL LOW (ref 3.5–5.0)
Alkaline Phosphatase: 45 U/L (ref 38–126)
Anion gap: 11 (ref 5–15)
BUN: 23 mg/dL (ref 8–23)
CO2: 21 mmol/L — ABNORMAL LOW (ref 22–32)
Calcium: 8.5 mg/dL — ABNORMAL LOW (ref 8.9–10.3)
Chloride: 113 mmol/L — ABNORMAL HIGH (ref 98–111)
Creatinine, Ser: 1.13 mg/dL (ref 0.61–1.24)
GFR, Estimated: >60 mL/min (ref >60)
Glucose, Bld: 134 mg/dL — ABNORMAL HIGH (ref 70–99)
Potassium: 4.2 mmol/L (ref 3.5–5.1)
Sodium: 145 mmol/L (ref 135–145)
Total Bilirubin: 0.4 mg/dL (ref 0.3–1.2)
Total Protein: 6.4 g/dL — ABNORMAL LOW (ref 6.5–8.1)

## 2020-06-26 LAB — PHENYTOIN LEVEL, TOTAL: Phenytoin Lvl: 11.8 ug/mL (ref 10.0–20.0)

## 2020-06-26 LAB — GLUCOSE, CAPILLARY
Glucose-Capillary: 135 mg/dL — ABNORMAL HIGH (ref 70–99)
Glucose-Capillary: 120 mg/dL — ABNORMAL HIGH (ref 70–99)
Glucose-Capillary: 137 mg/dL — ABNORMAL HIGH (ref 70–99)
Glucose-Capillary: 142 mg/dL — ABNORMAL HIGH (ref 70–99)
Glucose-Capillary: 109 mg/dL — ABNORMAL HIGH (ref 70–99)
Glucose-Capillary: 84 mg/dL (ref 70–99)

## 2020-06-26 LAB — CBC
HCT: 30.7 % — ABNORMAL LOW (ref 39.0–52.0)
Hemoglobin: 9.3 g/dL — ABNORMAL LOW (ref 13.0–17.0)
MCH: 26.5 pg (ref 26.0–34.0)
MCHC: 30.3 g/dL (ref 30.0–36.0)
MCV: 87.5 fL (ref 80.0–100.0)
Platelets: 278 10*3/uL (ref 150–400)
RBC: 3.51 MIL/uL — ABNORMAL LOW (ref 4.22–5.81)
RDW: 16.3 % — ABNORMAL HIGH (ref 11.5–15.5)
WBC: 14.3 10*3/uL — ABNORMAL HIGH (ref 4.0–10.5)
nRBC: 0 % (ref 0.0–0.2)

## 2020-06-26 LAB — MAGNESIUM: Magnesium: 2 mg/dL (ref 1.7–2.4)

## 2020-06-26 LAB — PHOSPHORUS: Phosphorus: 3 mg/dL (ref 2.5–4.6)

## 2020-06-26 MED ORDER — LORAZEPAM 2 MG/ML IJ SOLN
2.0000 mg | INTRAMUSCULAR | Status: DC | PRN
  Administered 2020-06-26 (×2): 2 mg via INTRAVENOUS
  Filled 2020-06-26 (×2): qty 1

## 2020-06-26 MED ORDER — SODIUM CHLORIDE 0.9 % IV SOLN
3000.0000 mg | INTRAVENOUS | Status: AC
  Administered 2020-06-26: 3000 mg via INTRAVENOUS
  Filled 2020-06-26: qty 30

## 2020-06-26 MED ORDER — PROPOFOL 1000 MG/100ML IV EMUL
5.0000 ug/kg/min | INTRAVENOUS | Status: DC
  Administered 2020-06-26: 20 ug/kg/min via INTRAVENOUS
  Filled 2020-06-26: qty 100

## 2020-06-26 MED ORDER — METOCLOPRAMIDE HCL 5 MG/ML IJ SOLN
10.0000 mg | Freq: Once | INTRAMUSCULAR | Status: AC
  Administered 2020-06-26: 10 mg via INTRAVENOUS
  Filled 2020-06-26: qty 2

## 2020-06-26 MED ORDER — PANTOPRAZOLE SODIUM 40 MG PO PACK
40.0000 mg | PACK | Freq: Every day | ORAL | Status: DC
  Administered 2020-06-26 – 2020-06-27 (×2): 40 mg
  Filled 2020-06-26 (×2): qty 20

## 2020-06-26 MED ORDER — LORAZEPAM 2 MG/ML IJ SOLN
INTRAMUSCULAR | Status: AC
  Administered 2020-06-26: 2 mg
  Filled 2020-06-26: qty 1

## 2020-06-26 MED ORDER — SODIUM CHLORIDE 0.9 % IV SOLN
2000.0000 mg | Freq: Two times a day (BID) | INTRAVENOUS | Status: DC
  Administered 2020-06-26 – 2020-06-27 (×2): 2000 mg via INTRAVENOUS
  Filled 2020-06-26 (×4): qty 20

## 2020-06-26 MED ORDER — LORAZEPAM 2 MG/ML IJ SOLN: 2.0000 mg | Freq: Once | INTRAMUSCULAR | Status: AC

## 2020-06-26 MED ORDER — HEPARIN SODIUM (PORCINE) 5000 UNIT/ML IJ SOLN
5000.0000 [IU] | Freq: Three times a day (TID) | INTRAMUSCULAR | Status: DC
  Administered 2020-06-26 – 2020-06-27 (×3): 5000 [IU] via SUBCUTANEOUS
  Filled 2020-06-26 (×3): qty 1

## 2020-06-26 MED ORDER — PROPOFOL BOLUS VIA INFUSION
0.5000 mg/kg | Freq: Once | INTRAVENOUS | Status: AC
  Administered 2020-06-26: 29.1 mg via INTRAVENOUS
  Filled 2020-06-26: qty 30

## 2020-06-26 MED ORDER — SODIUM CHLORIDE 0.9 % IV SOLN
10.0000 mg/kg | INTRAVENOUS | Status: AC
  Administered 2020-06-26: 582 mg via INTRAVENOUS
  Filled 2020-06-26: qty 11.64

## 2020-06-26 MED ORDER — LEVETIRACETAM IN NACL 1000 MG/100ML IV SOLN
1000.0000 mg | Freq: Two times a day (BID) | INTRAVENOUS | Status: DC
  Administered 2020-06-26: 1000 mg via INTRAVENOUS
  Filled 2020-06-26: qty 100

## 2020-06-26 NOTE — Progress Notes (Addendum)
Patient ID: Thomas Lopez, male   DOB: [birth year], 87 y.o.   MRN: [id number] Follow up - Trauma Critical Care  Patient Details:    Thomas Lopez is an 87 y.o. male.  Lines/tubes : Airway 7.5 mm (Active)  Secured at (cm) 24 cm 06/26/20 0731  Measured From Lips 06/26/20 0731  Secured Location Left 06/26/20 0731  Secured By [employer] 06/26/20 0731  Tube Holder Repositioned Yes 06/26/20 0731  Prone position No 06/26/20 0345  Cuff Pressure (cm H2O) 24 cm H2O 07/17/2020 1957  Site Condition Dry 06/26/20 0731     NG/OG Tube Orogastric 16 Fr. Center mouth Xray Measured external length of tube 37 cm (Active)  Site Assessment Clean;Dry;Intact 06/25/20 2000  Ongoing Placement Verification No change in cm markings or external length of tube from initial placement;No change in respiratory status;No acute changes, not attributed to clinical condition 06/25/20 2000  Status Clamped 06/25/20 2000  Output (mL) 250 mL 06/26/20 0600     External Urinary Catheter (Active)  Collection Container Standard drainage bag 06/25/20 2000  Securement Method Securing device (Describe) 06/25/20 2000  Site Assessment Clean;Intact;Dry 06/25/20 2000  Output (mL) 300 mL 06/26/20 0600    Microbiology/Sepsis markers: Results for orders placed or performed during the hospital encounter of 06/30/2020  SARS Coronavirus 2 by RT PCR (hospital order, performed in [facility] hospital lab) Nasopharyngeal Nasopharyngeal Swab     Status: None   Collection Time: 07/11/2020  2:40 AM   Specimen: Nasopharyngeal Swab  Result Value Ref Range Status   SARS Coronavirus 2 NEGATIVE NEGATIVE Final    Comment: (NOTE) SARS-CoV-2 target nucleic acids are NOT DETECTED.  The SARS-CoV-2 RNA is generally detectable in upper and lower respiratory specimens during the acute phase of infection. The lowest concentration of SARS-CoV-2 viral copies this assay can detect is 250 copies / mL. A negative result does not preclude SARS-CoV-2  infection and should not be used as the sole basis for treatment or other patient management decisions.  A negative result may occur with improper specimen collection / handling, submission of specimen other than nasopharyngeal swab, presence of viral mutation(s) within the areas targeted by this assay, and inadequate number of viral copies (<250 copies / mL). A negative result must be combined with clinical observations, patient history, and epidemiological information.  Fact Sheet for Patients:   StrictlyIdeas.no  Fact Sheet for Healthcare Providers: BankingDealers.co.za  This test is not yet approved or  cleared by the [place] FDA and has been authorized for detection and/or diagnosis of SARS-CoV-2 by FDA under an Emergency Use Authorization (EUA).  This EUA will remain in effect (meaning this test can be used) for the duration of the COVID-19 declaration under Section 564(b)(1) of the Act, 21 U.S.C. section 360bbb-3(b)(1), unless the authorization is terminated or revoked sooner.  Performed at [facility], [place] [address] [place], [place] [address]   MRSA PCR Screening     Status: None   Collection Time: 06/23/2020  6:02 AM   Specimen: Nasopharyngeal  Result Value Ref Range Status   MRSA by PCR NEGATIVE NEGATIVE Final    Comment:        The GeneXpert MRSA Assay (FDA approved for NASAL specimens only), is one component of a comprehensive MRSA colonization surveillance program. It is not intended to diagnose MRSA infection nor to guide or monitor treatment for MRSA infections. Performed at [facility], [place] [address] [place], [place] [address]  Anti-infectives:  Anti-infectives (From admission, onward)   None      Best Practice/Protocols:  VTE Prophylaxis: Mechanical Continous Sedation  Consults: Treatment Team:  Dawley, Troy C, DO    Studies:    Events:  Subjective:    Overnight  Issues:   Objective:  Vital signs for last 24 hours: Temp:  [97.5 F (36.4 C)-98.5 F (36.9 C)] 97.9 F (36.6 C) (02/04 0400) Pulse Rate:  [61-87] 75 (02/04 0731) Resp:  [15-32] 16 (02/04 0731) BP: (130-181)/(68-89) 148/76 (02/04 0600) SpO2:  [89 %-100 %] 99 % (02/04 0731) FiO2 (%):  [40 %] 40 % (02/04 0731)  Hemodynamic parameters for last 24 hours:    Intake/Output from previous day: 02/03 0701 - 02/04 0700 In: 1954.9 [I.V.:1798.7; IV Piggyback:156.1] Out: 1100 [Urine:850; Emesis/NG output:250]  Intake/Output this shift: No intake/output data recorded.  Vent settings for last 24 hours: Vent Mode: PRVC FiO2 (%):  [40 %] 40 % Set Rate:  [16 bmp] 16 bmp Vt Set:  [520 mL] 520 mL PEEP:  [5 cmH20] 5 cmH20 Pressure Support:  [8 cmH20] 8 cmH20 Plateau Pressure:  [15 NFA21-30 cmH20] 21 cmH20  Physical Exam:  General: on vent Neuro: seizure activity L periorbital and mouth HEENT/Neck: ETT Resp: clear to auscultation bilaterally CVS: RRR GI: soft, NT Extremities: no edema  Results for orders placed or performed during the hospital encounter of 2020/07/10 (from the past 24 hour(s))  Glucose, capillary     Status: Abnormal   Collection Time: 06/25/20 11:06 AM  Result Value Ref Range   Glucose-Capillary 150 (H) 70 - 99 mg/dL  Glucose, capillary     Status: Abnormal   Collection Time: 06/25/20  2:56 PM  Result Value Ref Range   Glucose-Capillary 132 (H) 70 - 99 mg/dL  Glucose, capillary     Status: Abnormal   Collection Time: 06/25/20  7:02 PM  Result Value Ref Range   Glucose-Capillary 116 (H) 70 - 99 mg/dL  Glucose, capillary     Status: Abnormal   Collection Time: 06/25/20 11:09 PM  Result Value Ref Range   Glucose-Capillary 134 (H) 70 - 99 mg/dL  CBC     Status: Abnormal   Collection Time: 06/26/20 12:49 AM  Result Value Ref Range   WBC 14.3 (H) 4.0 - 10.5 K/uL   RBC 3.51 (L) 4.22 - 5.81 MIL/uL   Hemoglobin 9.3 (L) 13.0 - 17.0 g/dL   HCT 30.7 (L) 39.0 - 52.0 %    MCV 87.5 80.0 - 100.0 fL   MCH 26.5 26.0 - 34.0 pg   MCHC 30.3 30.0 - 36.0 g/dL   RDW 16.3 (H) 11.5 - 15.5 %   Platelets 278 150 - 400 K/uL   nRBC 0.0 0.0 - 0.2 %  Comprehensive metabolic panel     Status: Abnormal   Collection Time: 06/26/20 12:49 AM  Result Value Ref Range   Sodium 145 135 - 145 mmol/L   Potassium 4.2 3.5 - 5.1 mmol/L   Chloride 113 (H) 98 - 111 mmol/L   CO2 21 (L) 22 - 32 mmol/L   Glucose, Bld 134 (H) 70 - 99 mg/dL   BUN 23 8 - 23 mg/dL   Creatinine, Ser 1.13 0.61 - 1.24 mg/dL   Calcium 8.5 (L) 8.9 - 10.3 mg/dL   Total Protein 6.4 (L) 6.5 - 8.1 g/dL   Albumin 2.3 (L) 3.5 - 5.0 g/dL   AST 16 15 - 41 U/L   ALT 15 0 - 44 U/L  Alkaline Phosphatase 45 38 - 126 U/L   Total Bilirubin 0.4 0.3 - 1.2 mg/dL   GFR, Estimated >60 >60 mL/min   Anion gap 11 5 - 15  Magnesium     Status: None   Collection Time: 06/26/20 12:49 AM  Result Value Ref Range   Magnesium 2.0 1.7 - 2.4 mg/dL  Phosphorus     Status: None   Collection Time: 06/26/20 12:49 AM  Result Value Ref Range   Phosphorus 3.0 2.5 - 4.6 mg/dL  Glucose, capillary     Status: Abnormal   Collection Time: 06/26/20  3:06 AM  Result Value Ref Range   Glucose-Capillary 135 (H) 70 - 99 mg/dL    Assessment & Plan: Present on Admission: . Traumatic brain injury ([place])    LOS: 2 days   Additional comments:I reviewed the patient's new clinical lab test results. . Fall down stairs TBI, Calvarial fracture, extensive subarachnoid hemorrhage, subdural and epidural hemorrhage - F/U CT head 2/3 stable C/W 2/2. New onset SZ activity this AM L periorbital, mouth and LUE. Ativan X 1. D/W NS and they will increase Keppra and add Ativan PRN. I have ordered an EEG. If SZ continue despite above will consult Neurology. C spine - CT neg, D/W Dr. Vertell Limber, consider MR in a couple days if neuro status does not allow clearance Calvarial fracture line extension onto the roof of the left orbit and into the region of the cribriform  plate, slight proptosis of the left globe - consider ophthalmology consult if patient neuro status improves Acute hypoxic ventilator dependent respiratory failure - weaned some yesterday. Mental status precludes extubation. HTN - home lisinopril FEN - TF held for vomiting, likely due to TBI as abd benign, OGT to suction today. Meds per tube OK. Will try to get CorTrak post-pyloric today. VTE - PAS, OK for SQ hep per Dr. Reatha Armour today Dispo - ICU, Palliative at bedside. Appreciate their help. Critical Care Total Time*: 46 Minutes  Georganna Skeans, MD, MPH, FACS Trauma & General Surgery Use [email or web address] to contact on call provider  06/26/2020  *Care during the described time interval was provided by me. I have reviewed this patient's available data, including medical history, events of note, physical examination and test results as part of my evaluation.

## 2020-06-26 NOTE — Progress Notes (Addendum)
° °  Providing Compassionate, Quality Care - Together  NEUROSURGERY PROGRESS NOTE   S: No issues overnight. sz this am, L sided twitching of face and eye and arm and leg per RN  O: EXAM:  BP (!) 148/76    Pulse 75    Temp 98 F (36.7 C) (Axillary)    Resp 16    Ht 5\' 7"  (1.702 m)    Wt 58.2 kg    SpO2 99%    BMI 20.10 kg/m   Intubated Sedated Eyes closed to pain, PERRL Leftward gaze pref Twitching of left eyelid and cheek Face symmetric Collar in place No motor response to pain  ASSESSMENT:  87 y.o. male with  1. Traumatic SDH/SAH w skull fractures 2. Sz 3. Traumatic encephalopathy  PLAN: - increased keppra to 1000 BID, received ativan, EEG being placed now - no nsx intervention - cont support care - ok for SQH - guarded prognosis - if persistent sz, would rec neurology consult for possible second agent vs burst suppression    Thank you for allowing me to participate in this patient's care.  Please do not hesitate to call with questions or concerns.   Elwin Sleight, [facility] Cell: [phone]

## 2020-06-26 NOTE — Procedures (Signed)
Patient Name: JANSON LAMAR  MRN: [id number]  Epilepsy Attending: Lora Havens  Referring Physician/Provider: Dr. Georganna Skeans Date: 06/26/2020 Duration: 36.46 mins  Patient history: 87 year old male with past medical history of metastatic prostate cancer who was found down by his wife.  On arrival CT head showed extensive subarachnoid hemorrhage across the right cerebral convexity.  Today he was noted to have seizure-like activity on the left side of face.  EEG to evaluate for seizures.  Level of alertness: comatose  AEDs during EEG study: LEV  Technical aspects: This EEG study was done with scalp electrodes positioned according to the 10-20 International system of electrode placement. Electrical activity was acquired at a sampling rate of 500Hz  and reviewed with a high frequency filter of 70Hz  and a low frequency filter of 1Hz . EEG data were recorded continuously and digitally stored.   Description: Patient was noted to have multiple episodes of left face and eye twitching.  Concomitant EEG showed right frontocentral sharp wave with sharply contoured 9 to 10 Hz alpha activity which gradually evolved into 5 to 6 Hz theta slowing and eventually 2 to 3 Hz delta slowing and involved right hemisphere.  EEG also showed continuous generalized 3 to 6 Hz theta-delta slowing.  Hyperventilation and photic stimulation were not performed.     ABNORMALITY -Focal motor status epilepticus, right frontocentral region -Continuous slow, generalized  IMPRESSION: This study showed focal motor status epilepticus arising from right frontocentral region.  Additionally there is moderate to severe diffuse encephalopathy, nonspecific etiology.  Dr. Grandville Silos was notified.  Margaretha Mahan Barbra Sarks

## 2020-06-26 NOTE — Consult Note (Signed)
Palliative Medicine Inpatient Consult Note  Reason for consult:  Goals of Care  HPI:  Per intake H&P --> Thomas Lopez is an 87 y.o. male with a past medical history of metastaticprostate cancer status post seed implant/ radiation and now on Lupron, hypertension, CKD 3, chronic anemia,hyperlipidemia, glaucoma, GERD, irritable bowel syndrome, carotid stenosis and aortoiliac atherosclerosis, who was found down by his wife at the bottom of his steps after presumably falling. His wife woke up during the night to use the bathroom, and noted the patient was not in bed. A quick search by the wife found him to be laying down at the bottom of their basement steps. He fell approximately 15-20 steps. His wife stated he was in his normal state prior to going to bed. On arrival to the ED, the patient was unresponsive and was hypertensive.   Palliative care has been asked to get involved to discuss goals of care.  Clinical Assessment/Goals of Care:  *Please note that this is a verbal dictation therefore any spelling or grammatical errors are due to the "[employer]" system interpretation.  I have reviewed medical records including EPIC notes, labs and imaging, received report from bedside RN, assessed the patient who is intubated and noted to have seizure like activity with eye twitching and mouth dysregulation.    I met with patients spouse, Thomas Lopez to further discuss diagnosis prognosis, GOC, EOL wishes, disposition and options.   I introduced Palliative Medicine as specialized medical care for people living with serious illness. It focuses on providing relief from the symptoms and stress of a serious illness. The goal is to improve quality of life for both the patient and the family.  [name] shares with me that Thomas Lopez is from [place], [place]. They are each one another's second marriage. Yuchen has three daughters and two sons from his first marriage. [name] has one daughter  from her prior marriage. 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 He is someone who enjoys bowling. He is identified as a "funny guy". He is a man of faith and practices within the [facility] denomination.   Prior to hospitalizations Thomas Lopez was not using a cane or a walker but his wife was assisting him with all of his bADL's.  [name] states that prior Thomas Lopez had struggles with metastatic prostate cancer. She expresses that his functional state was not great but they laughed together "often". She shares that he still had great joy in his life leading up to his fall.   A detailed discussion was had today regarding advanced directives - patient has none on record therefore his surrogate decision maker by default would be his wife, [name].    Concepts specific to code status, artifical feeding and hydration, continued IV antibiotics and rehospitalization was had.  We reviewed that if [name] were to undergo a massive cardiac event that his likely outcomes would be poor with resuscitation efforts. I expressed that we would likely traumatize his body with no real benefit. We discussed continuing present measures to see if improvements can be made but not doing CPR if he should have a cardiac arrest.   I completed a MOST form today. The patients wife outlined their wishes for the following treatment decisions:  Cardiopulmonary Resuscitation: Do Not Attempt Resuscitation (DNR/No CPR)  Medical Interventions: Full Scope of Treatment: Use intubation, advanced airway interventions, mechanical ventilation, cardioversion as indicated, medical treatment, IV fluids, etc, also provide comfort measures. Transfer to the hospital if  indicated  Antibiotics: Antibiotics if indicated  IV Fluids: IV fluids if indicated  Feeding Tube: Feeding tube for a defined trial period   The difference between a aggressive medical intervention path  and a palliative comfort care path for this patient at this  time was had. We discussed that patients in [place] situation although make a degree of recovery can often have a new baseline level of function which includes a much higher level of care needs. We discussed at this point we should consider the quality over quanity of Thomas Lopez life when making decisions.  [name] states that Thomas Lopez youngest daughter is communicated with daily and she disseminates information to the rest of the family. [name] is at odds with Thomas Lopez's oldest daughter which proves to be a challenge. We discussed coordinating a family phone conference so that everyone's questions  Could be answered at the same time.   Discussed the importance of continued conversation with family and their  medical providers regarding overall plan of care and treatment options, ensuring decisions are within the context of the patients values and GOCs.  Provided "Hard Choices for [employer]" booklet.   Decision Maker: Thomas Lopez [phone]  SUMMARY OF RECOMMENDATIONS   DNAR  Continue current efforts to see if improvements can be made  Coordinate a family meeting - projected date for Sunday. Discussion points include guarded prognosis  Spiritual Support  Ongoing PMT support  Code Status/Advance Care Planning: DNAR  Palliative Prophylaxis:   Oral Care, ROM, Pain, Constipation  Additional Recommendations (Limitations, Scope, Preferences):  Continue to treat what is treatable  Psycho-social/Spiritual:   Desire for further Chaplaincy support: Yes - [employer]  Additional Recommendations: Education on the high possibility of new normal for functional state.    Prognosis: Likely poor given patients age and additional co-morbid conditions  Discharge Planning: Unclear  Vitals:   06/26/20 0500 06/26/20 0600  BP: (!) 177/69 (!) 148/76  Pulse: 74 75  Resp: 17 17  Temp:    SpO2: 97% 98%    Intake/Output Summary (Last 24 hours) at 06/26/2020 1771 Last data filed at 06/26/2020  0600 Gross per 24 hour  Intake 1954.88 ml  Output 1100 ml  Net 854.88 ml   Last Weight  Most recent update: 06/25/2020  4:50 AM   Weight  58.2 kg (128 lb 4.9 oz)           Gen:  Elderly AA M intubated HEENT: dry mucous membranes,  (+)ETT, (+) coretrack CV: Regular rate and rhythm  PULM: Intubated, on ventilator ABD: soft/nontender  EXT: No edema  Neuro: (+) seizure activity   PPS: 10%   This conversation/these recommendations were discussed with patient primary care team, Dr. Grandville Silos  Time In: 1215 Time Out: 1325 Total Time: 70 Greater than 50%  of this time was spent counseling and coordinating care related to the above assessment and plan.  [place] Team Team Cell Phone: [phone] Please utilize secure chat with additional questions, if there is no response within 30 minutes please call the above phone number  Palliative Medicine Team providers are available by phone from 7am to 7pm daily and can be reached through the team cell phone.  Should this patient require assistance outside of these hours, please call the patient's attending physician.

## 2020-06-26 NOTE — Progress Notes (Signed)
Patient ID: Thomas Lopez, male   DOB: 11-Nov-1932, 87 y.o.   MRN: [id number] I called his wife and updated her on the latest events. Georganna Skeans, MD, MPH, FACS Please use [email or web address] to contact on call provider

## 2020-06-26 NOTE — Progress Notes (Signed)
EEG in process, Result pending 

## 2020-06-26 NOTE — Progress Notes (Signed)
EEG completed, Result Pending.

## 2020-06-26 NOTE — Consult Note (Addendum)
Neurology Consultation Reason for Consult: Seizure Referring Physician: Dr. Georganna Skeans  CC: Fall  History is obtained from: Chart review as patient is altered  HPI: Thomas Lopez is a 87 y.o. male with PMH of HTN, CKD3, Chronic anemia, HLD, IBS, GERD, history of metastatic prostate cancer status post seed implant/ radiation and now on Lupron, carotid stenosis and aortoiliac atherosclerosis who was brought in by EMS after a fall. Per wife, she found him in the basement after he probably fell down the stairs. On arrival he was unresponsive. CTH showed extensive subarachnoid hemorrhage across the right cerebral convexity extending across the right frontal, parietal, temporal and to a lesser extent occipital lobes. A lentiform epidural collection is seen across the right parietal lobe. Additional subdural hemorrhage extending across the right cerebral convexity. There is additional left epidural hemorrhage across the high left parietal convexity. No significant mass effect or midline shift is present. Neurosurg was consulted and non neurosurgical intervention was recommended. This morning patient was noted to have left face twitching for which EEG was ordered which showed focal status epilepticus arising from right frontocentral region. Therefore, neurology was consulted.    ROS:  Unable to obtain due to altered mental status.   Past medical history, past surgical history, family history, social history unable to obtain as patient is altered  Exam: Current vital signs: BP (!) 148/76   Pulse 75   Temp 98 F (36.7 C) (Axillary)   Resp 16   Ht 5\' 7"  (1.702 m)   Wt 58.2 kg   SpO2 99%   BMI 20.10 kg/m  Vital signs in last 24 hours: Temp:  [97.9 F (36.6 C)-98.5 F (36.9 C)] 98 F (36.7 C) (02/04 0800) Pulse Rate:  [72-81] 75 (02/04 0731) Resp:  [15-32] 16 (02/04 0731) BP: (130-179)/(69-89) 148/76 (02/04 0600) SpO2:  [89 %-100 %] 99 % (02/04 0731) FiO2 (%):  [40 %] 40 % (02/04  0731)   Physical Exam  Constitutional: laying in bed, NAD Psych: unable to assess due to intubation Eyes: No scleral injection HENT: No OP obstrucion Head: Normocephalic.  Cardiovascular: Normal rate and regular rhythm.  Respiratory: intubated GI: Soft.  No distension.  Skin: Warm Neuro: doesn't open eyes to noxious stimulation, doesn't follow commands, miotic pupils with difficult to appreciate reactivity, corneal reflex absent, rhythmic left eyelid twitching, no forced gaze deviation, doesn't withdraw in BL UE, withdraws to noxious stimulation in Bl LE  I have reviewed labs in epic and the results pertinent to this consultation are:   I have reviewed the images obtained: CT head without contrast 06/25/2020: Unchanged appearance of mixed right convexity epidural, subdural and subarachnoid hemorrhage. Unchanged left convexity subdural hematoma. Unchanged punctate hemorrhages in the left frontal white matter and along the septum pellucidum. No hydrocephalus or new mass effect.     ASSESSMENT/PLAN: 87 year old male presented after a fall and was found to have right epidural, subdural as well as subarachnoid hemorrhage as well as left subdural hematoma.  He was noted to have left face twitching consistent with focal motor status epilepticus.  Focal motor status epilepticus Subdural hemorrhage Subarachnoid hemorrhage Epidural hematoma Hypoproteinemia with hypoalbuminemia Leukocytosis Anemia -Routine EEG showed focal motor status epilepticus  Recommendations: -Patient on Keppra 1000 mg twice daily, will load with 3gm and increase to 2000 mg twice daily -Continue seizure precautions - Avoid ativan as its causes bradycardia when administered twice recently per RN - Discussed plan with wife in detail  ADDENDUM - At 1300, seizures persist, therefore  will load with fosphenytoin and check post load level in 2hours   ADDENDUM - At 1515, continues to have left eyelid twitching. Will  start propofol infusion at 31mcg/hr. Can be titrated up with goal seizure suppression  CRITICAL CARE Performed by: Lora Havens   Total critical care time: 55 minutes  Critical care time was exclusive of separately billable procedures and treating other patients.  Critical care was necessary to treat or prevent imminent or life-threatening deterioration.  Critical care was time spent personally by me on the following activities: development of treatment plan with patient and/or surrogate as well as nursing, discussions with consultants, evaluation of patient's response to treatment, examination of patient, obtaining history from patient or surrogate, ordering and performing treatments and interventions, ordering and review of laboratory studies, ordering and review of radiographic studies, pulse oximetry and re-evaluation of patient's condition.     [name] Epilepsy [facility] neurohospitalist

## 2020-06-26 NOTE — Progress Notes (Addendum)
Cortrak Tube Team Note:  Consult received to advance exisiting Cortrak feeding tube to post pyloric position given recurrent vomiting.   Multiple attempts were made to advance but tube continued to coil despite different bed positions and administration of Reglan. Suspect patient may have hernia or abnormal anatomy. MD made aware. GOC being discussed.   Tube was left in R nare at 77 cm marking and secured with bridle.   Mariana Single RD, LDN Clinical Nutrition Pager listed in [place]

## 2020-06-26 NOTE — TOC Initial Note (Signed)
Transition of Care [facility]) - Initial/Assessment Note    Patient Details  Name: Thomas Lopez MRN: [id number] Date of Birth: Sep 23, 1932  [facility]) CM/SW Contact:    Ella Bodo, RN Phone Number: 06/26/2020, 3:30 PM  Clinical Narrative: Pt admitted on 06/26/2020 after falling down 15-20 steps.  Pt sustained TBI, calvarial fx, extensive SAH, SDH and epidural hemorrhage.  PTA, pt independent and living at home with spouse. Pt with seizure activity this morning; palliative medicine team following for goals of care.    Trauma TOC CM will continue to follow/assist as needed.                        Barriers to Discharge: Continued Medical Work up           Expected Discharge Plan and Services     Discharge Planning Services: CM Consult   Living arrangements for the past 2 months: Single Family Home                                      Prior Living Arrangements/Services Living arrangements for the past 2 months: Single Family Home Lives with:: Spouse Patient language and need for interpreter reviewed:: Yes        Need for Family Participation in Patient Care: Yes (Comment) Care giver support system in place?: Yes (comment)   Criminal Activity/Legal Involvement Pertinent to Current Situation/Hospitalization: No - Comment as needed                 Emotional Assessment Appearance:: Appears stated age Attitude/Demeanor/Rapport: Unable to Assess Affect (typically observed): Unable to Assess        Admission diagnosis:  Trauma [T14.90XA] SAH (subarachnoid hemorrhage) ([place]) [I60.9] SDH (subdural hematoma) ([place]) [J82.5K5L] Traumatic brain injury ([place]) [Z76.7H4L] Fall [W19.XXXA] Pulmonary nodules/lesions, multiple [R91.8] Traumatic hemorrhage of right cerebrum with loss of consciousness of 1 hour to 5 hours 59 minutes, initial encounter ([place]) [P37.902I] Closed fracture of skull, unspecified bone, initial encounter ([place]) [S02.91XA] Patient Active  Problem List   Diagnosis Date Noted  . Malnutrition of moderate degree 06/26/2020  . Traumatic brain injury ([place]) 07/16/2020   PCP:  [name], MD Pharmacy:   [facility] [phone] [address], [address] [place] [address] Phone: [phone] Fax: [facility] [place], [place] [address] Phone: [phone] Fax: [phone]     Social Determinants of Health (SDOH) Interventions    Readmission Risk Interventions No flowsheet data found.   Reinaldo Raddle, RN, BSN  Trauma/Neuro ICU Case Manager [phone]

## 2020-06-26 NOTE — Progress Notes (Signed)
EEG in process, result pending.. 

## 2020-06-27 DIAGNOSIS — I609 Nontraumatic subarachnoid hemorrhage, unspecified: Secondary | ICD-10-CM

## 2020-06-27 DIAGNOSIS — S065X9A Traumatic subdural hemorrhage with loss of consciousness of unspecified duration, initial encounter: Secondary | ICD-10-CM

## 2020-06-27 DIAGNOSIS — Z7189 Other specified counseling: Secondary | ICD-10-CM | POA: Diagnosis not present

## 2020-06-27 DIAGNOSIS — Z515 Encounter for palliative care: Secondary | ICD-10-CM | POA: Diagnosis not present

## 2020-06-27 DIAGNOSIS — S06343A Traumatic hemorrhage of right cerebrum with loss of consciousness of 1 hours to 5 hours 59 minutes, initial encounter: Secondary | ICD-10-CM

## 2020-06-27 LAB — TRIGLYCERIDES: Triglycerides: 58 mg/dL (ref <150)

## 2020-06-27 LAB — CBC
HCT: 32.9 % — ABNORMAL LOW (ref 39.0–52.0)
Hemoglobin: 9.6 g/dL — ABNORMAL LOW (ref 13.0–17.0)
MCH: 27.3 pg (ref 26.0–34.0)
MCHC: 29.2 g/dL — ABNORMAL LOW (ref 30.0–36.0)
MCV: 93.5 fL (ref 80.0–100.0)
Platelets: 278 10*3/uL (ref 150–400)
RBC: 3.52 MIL/uL — ABNORMAL LOW (ref 4.22–5.81)
RDW: 16.7 % — ABNORMAL HIGH (ref 11.5–15.5)
WBC: 15.1 10*3/uL — ABNORMAL HIGH (ref 4.0–10.5)
nRBC: 0 % (ref 0.0–0.2)

## 2020-06-27 LAB — COMPREHENSIVE METABOLIC PANEL
ALT: 64 U/L — ABNORMAL HIGH (ref 0–44)
AST: 98 U/L — ABNORMAL HIGH (ref 15–41)
Albumin: 1.6 g/dL — ABNORMAL LOW (ref 3.5–5.0)
Alkaline Phosphatase: 40 U/L (ref 38–126)
Anion gap: 13 (ref 5–15)
BUN: 39 mg/dL — ABNORMAL HIGH (ref 8–23)
CO2: 16 mmol/L — ABNORMAL LOW (ref 22–32)
Calcium: 7.9 mg/dL — ABNORMAL LOW (ref 8.9–10.3)
Chloride: 119 mmol/L — ABNORMAL HIGH (ref 98–111)
Creatinine, Ser: 2.44 mg/dL — ABNORMAL HIGH (ref 0.61–1.24)
GFR, Estimated: 25 mL/min — ABNORMAL LOW (ref >60)
Glucose, Bld: 118 mg/dL — ABNORMAL HIGH (ref 70–99)
Potassium: 5.9 mmol/L — ABNORMAL HIGH (ref 3.5–5.1)
Sodium: 148 mmol/L — ABNORMAL HIGH (ref 135–145)
Total Bilirubin: 0.7 mg/dL (ref 0.3–1.2)
Total Protein: 4.8 g/dL — ABNORMAL LOW (ref 6.5–8.1)

## 2020-06-27 LAB — GLUCOSE, CAPILLARY
Glucose-Capillary: 45 mg/dL — ABNORMAL LOW (ref 70–99)
Glucose-Capillary: 147 mg/dL — ABNORMAL HIGH (ref 70–99)
Glucose-Capillary: 110 mg/dL — ABNORMAL HIGH (ref 70–99)

## 2020-06-27 LAB — MAGNESIUM: Magnesium: 2.1 mg/dL (ref 1.7–2.4)

## 2020-06-27 LAB — PHOSPHORUS: Phosphorus: 7.2 mg/dL — ABNORMAL HIGH (ref 2.5–4.6)

## 2020-06-27 MED ORDER — SODIUM POLYSTYRENE SULFONATE 15 GM/60ML PO SUSP
30.0000 g | Freq: Once | ORAL | Status: DC
  Filled 2020-06-27: qty 120

## 2020-06-27 MED ORDER — SODIUM POLYSTYRENE SULFONATE 15 GM/60ML PO SUSP: 30.0000 g | Freq: Once | ORAL | Status: DC

## 2020-06-27 MED ORDER — DEXTROSE 50 % IV SOLN
INTRAVENOUS | Status: AC
  Filled 2020-06-27: qty 50

## 2020-06-27 MED ORDER — CALCIUM GLUCONATE-NACL 1-0.675 GM/50ML-% IV SOLN
1.0000 g | Freq: Once | INTRAVENOUS | Status: DC
  Filled 2020-06-27: qty 50

## 2020-06-27 MED ORDER — DEXTROSE 50 % IV SOLN: 25.0000 g | Freq: Once | INTRAVENOUS | Status: AC

## 2020-06-27 MED ORDER — SODIUM CHLORIDE 0.9 % IV BOLUS
1000.0000 mL | Freq: Once | INTRAVENOUS | Status: AC
  Administered 2020-06-27: 1000 mL via INTRAVENOUS

## 2020-06-27 MED ORDER — NOREPINEPHRINE 4 MG/250ML-% IV SOLN
2.0000 ug/min | INTRAVENOUS | Status: DC
  Administered 2020-06-27: 2 ug/min via INTRAVENOUS
  Filled 2020-06-27: qty 250

## 2020-06-27 MED ORDER — PHENYLEPHRINE HCL-NACL 10-0.9 MG/250ML-% IV SOLN
INTRAVENOUS | Status: AC
  Filled 2020-06-27: qty 250

## 2020-06-27 MED ORDER — SODIUM CHLORIDE 0.9 % IV SOLN: 250.0000 mL | INTRAVENOUS | Status: DC

## 2020-07-08 ENCOUNTER — Inpatient Hospital Stay: Payer: Medicare PPO

## 2020-07-08 ENCOUNTER — Telehealth: Payer: Self-pay | Admitting: Family Medicine

## 2020-07-08 NOTE — Telephone Encounter (Signed)
Called wife to express my condolences - no answer, will try again later.

## 2020-07-13 ENCOUNTER — Ambulatory Visit: Payer: Medicare PPO

## 2020-07-13 ENCOUNTER — Ambulatory Visit: Payer: Medicare PPO | Admitting: Internal Medicine

## 2020-07-13 NOTE — Telephone Encounter (Signed)
Called again, no answer

## 2020-07-13 NOTE — Telephone Encounter (Signed)
Called again - no answer, no voicemail.

## 2020-07-21 NOTE — Progress Notes (Signed)
Patients BP for 0300 was 76/53 (63). I retook but BP did not change. BP has been dipping throughout the night but this time MAP not meeting expectations. Contacted Dr. Rosendo Gros with concern and 1L Bolus was ordered.   Hart Rochester, RN

## 2020-07-21 NOTE — Progress Notes (Signed)
RN at bedside when patient's BP and HR began to drop. Patient maxed out on Levo. Dr. Reatha Armour called and made aware of same. Family at bedside and aware of patient's status and current situation. Dr. Redmond Pulling on unit and called to room. Patient's family- wife, daughters, and son, wish to proceed with full comfort care. As extubation/comfort care being discussed, patient went asystole. Vent turned off, IV medications stopped. Death pronounced by Dr. Redmond Pulling at 1025, confirmed by RN as well. Patient absent of all vital signs, pupils fixed.  Family at bedside at this time, chaplain present.

## 2020-07-21 NOTE — Death Summary Note (Signed)
DEATH SUMMARY   Patient Details  Name: SPIKE DESILETS MRN: [id number] DOB: Jul 29, 1932  Admission/Discharge Information   Admit Date:  2020/07/06  Date of Death: Date of Death: 09-Jul-2020  Time of Death: Time of Death: 08/23/23  Length of Stay: 3  Referring Physician: [name], MD   Reason(s) for Hospitalization  Traumatic brain injury  Diagnoses  Preliminary cause of death:  Secondary Diagnoses (including complications and co-morbidities):  Active Problems:   Traumatic brain injury ([place])   Malnutrition of moderate degree   Brief Hospital Course (including significant findings, care, treatment, and services provided and events leading to death)  BERK PILOT is a 87 y.o. year old male who arrived as a level 1 trauma alert after falling down the stairs. His wife apparently woke up to use the restroom and noted that he was not in the bed, and found him at the bottom of the basement stairs, about 15-20 steps down. He was last seen in his normal state about an hour before this. He has remained unresponsive for EMS and has been hypertensive. He was intubated.  W/U in the ED demonstrated TBI with SAH/SDH/EDH with associated skull FX extending into the L orbit. He was admitted to the ICU. Dr. Vertell Limber consulted from Neurosurgery. F/U CTH showed slight worsening but no operative interventions recommended. F/U CT the following day was stable. Neuro exam remained WD to pain. In light of his age and this extensive TBI, Palliative Care was consulted. Discussions were held with the family to determine if his wishes would be trach/PEG and facility placement vs comfort care in this situation. He was made DNR. Before further plan decisions were made, he began to have seizures. He was in focal status epilepticus. Neurosurgery and Neurology managed with combined antiepileptic agents. He required Propofol as well. He became hypotensive despite maximal efforts. Another discussion was had with the family but  the patient went asystole and was pronounced.     Pertinent Labs and Studies  Significant Diagnostic Studies DG Abd 1 View  Result Date: 06/25/2020 CLINICAL DATA:  Check gastric catheter placement EXAM: ABDOMEN - 1 VIEW COMPARISON:  Film from previous day. FINDINGS: Gastric catheter has been advanced and is now coiled within the stomach. Feeding catheter is noted within the stomach as well. Scattered nonobstructing bowel gas pattern is noted. IMPRESSION: Feeding catheter and nasogastric catheter as described within the stomach. Electronically Signed   By: Inez Catalina M.D.   On: 06/25/2020 00:35   DG Abd 1 View  Result Date: 06-Jul-2020 CLINICAL DATA:  Fall down flight of stairs, check gastric catheter placement EXAM: ABDOMEN - 1 VIEW COMPARISON:  None. FINDINGS: Gastric catheter is noted extending into the stomach although the proximal side port lies in the distal esophagus. This should be advanced several cm deeper into the stomach. No free air is noted. IMPRESSION: Gastric catheter as described. Electronically Signed   By: Inez Catalina M.D.   On: 07-06-2020 03:17   CT HEAD WO CONTRAST  Result Date: 06/25/2020 CLINICAL DATA:  Intracranial hemorrhage follow up EXAM: CT HEAD WITHOUT CONTRAST TECHNIQUE: Contiguous axial images were obtained from the base of the skull through the vertex without intravenous contrast. COMPARISON:  06-Jul-2020 FINDINGS: Brain: Unchanged appearance of mixed right convexity epidural, subdural and subarachnoid hemorrhage. Blood within the occipital horns of the lateral ventricles is unchanged. Posterior left convexity subdural hematoma is unchanged. Punctate hemorrhages in the left frontal white matter and along the septum pellucidum are unchanged. No hydrocephalus or  new mass effect. Vascular: Calcification of the vertebral and carotid arteries at the skull base. Skull: Unchanged calvarial fractures with diffuse scalp swelling Sinuses/Orbits: No acute finding. Other: None.  IMPRESSION: 1. Unchanged appearance of mixed right convexity epidural, subdural and subarachnoid hemorrhage. 2. Unchanged left convexity subdural hematoma. 3. Unchanged punctate hemorrhages in the left frontal white matter and along the septum pellucidum. 4. No hydrocephalus or new mass effect. Electronically Signed   By: Ulyses Jarred M.D.   On: 06/25/2020 02:41   CT HEAD WO CONTRAST  Result Date: 07/08/2020 CLINICAL DATA:  Epidural hematoma EXAM: CT HEAD WITHOUT CONTRAST TECHNIQUE: Contiguous axial images were obtained from the base of the skull through the vertex without intravenous contrast. COMPARISON:  Earlier today FINDINGS: Brain: Subarachnoid hemorrhage greatest along the right cerebral sulci. Bilateral subdural hematoma along the posterior cerebral convexities measuring up to 4 mm in thickness. These hematomas have increased and there is a lower density component extending more anteriorly. Along right parietal bone fracturing is an adjacent epidural appearing hematoma measuring 4 mm thickness. Small volume hemorrhage in the septum pellucidum. Mild increase in hemorrhage layering in the occipital horns, with stable ventricular volume. A shear-type hemorrhage is present in the white matter at the left frontal horn. Brain edema in right frontal and temporal lateral sulci with small parenchymal hemorrhages, consistent with contusion. Vascular: No hyperdense vessel or unexpected calcification. Skull: Bilateral parietal fractures in the axial plane extending anteriorly and to the widened lambdoid sutures with a left paramedian occipital bone fracture. No fracture depression. Maxillofacial CT was previously obtained. Sinuses/Orbits: Bilateral cataract resection. IMPRESSION: 1. Bilateral subdural hematoma with mild progression from earlier today. Greatest mass effect is in the right parietal region where there is a superimposed epidural hematoma and subarachnoid hemorrhage, total extra-axial hemorrhage  thickness of 12 mm. 2. Intraventricular hemorrhage without hydrocephalus. 3. Hemorrhagic contusion affecting lateral right gyri 4. Shear-type hemorrhages in the left frontal white matter and septum pellucidum. 5. Extensive nondisplaced calvarial fracturing and scalp hematoma. Electronically Signed   By: Monte Fantasia M.D.   On: 07/02/2020 10:16   CT Head Wo Contrast  Result Date: 07/11/2020 CLINICAL DATA:  Suspected trauma, found unresponsive at the bottom of stairs EXAM: CT HEAD WITHOUT CONTRAST CT MAXILLOFACIAL WITHOUT CONTRAST CT CERVICAL SPINE WITHOUT CONTRAST TECHNIQUE: Multidetector CT imaging of the head, cervical spine, and maxillofacial structures were performed using the standard protocol without intravenous contrast. Multiplanar CT image reconstructions of the cervical spine and maxillofacial structures were also generated. COMPARISON:  CT head 11/24/2017, PET-CT 10/24/2019 FINDINGS: CT HEAD FINDINGS Brain: Extensive subarachnoid hemorrhage across the right cerebral convexity extending across the right frontal, parietal, temporal and to a lesser extent occipital lobes. A lentiform epidural collection is seen across the right parietal lobe measuring up to 5 mm in maximal thickness (5/46). Additional subdural hemorrhage extending across the right cerebral convexity measuring up to 7 mm in maximal thickness. There is additional left epidural hemorrhage across the high left parietal convexity as well measuring up to 5 mm in maximal thickness subjacent to the calvarial fracture line as well (5/49). Additional mixed attenuation hemorrhagic products are seen layering dependently within the occipital horns of the lateral ventricles. No significant mass effect or midline shift is present at this time, likely medicated by a background of moderate parenchymal volume loss. No other acute intracranial abnormality is seen, specifically no CT evidence of infarct. Ventricular caliber is similar to comparison arguing  against the presence of a developing hydrocephaly at this time.  Patchy areas of white matter hypoattenuation are most compatible with chronic microvascular angiopathy. Vascular: Atherosclerotic calcification of the carotid siphons and intradural vertebral arteries. No hyperdense vessel. Skull: There is a complex, comminuted calvarial fracture line with a dominant transverse fracture line extending from the right parietal bone, crossing the lambdoid sutures and superior apex of the occipital bone and into the left occipital bone best seen on coronal reconstructions (5/50-60). Additional smaller comminuted fracture lines extend across the superior right parietal bone, inferiorly into the occipital bone, and anteriorly into the superior and lateral walls of the right orbit and into the right planum additional fracture line extension is seen across the more superior right parietal bone, inferiorly into the left occipital bone, and anteriorly into the left orbital roof and into border between the left cribriform plate and planum ethmoidale. There is extensive circumferential scalp swelling and hematoma particularly superficial to the fracture lines. Other: None. CT MAXILLOFACIAL FINDINGS Osseous: Fracture line extension into the roof of the left orbit with medial extension into the region of the planum ethmoidale and cribriform plate. No other visible fracture of the bony orbits. Nasal bones are intact. No other mid face fractures are seen. The pterygoid plates are intact. No visible or suspected temporal bone fractures. 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 Some chronic erosive changes of the alveolar ridge mandible is noted. Mandible is otherwise intact. Orbits: Small amount of retro septal swelling and hemorrhage along the orbital roof adjacent the fracture line as well as a slight asymmetric proptosis of the left globe. Additional left periorbital/supraorbital soft  tissue swelling is present. No retro septal or intraconal gas. The globes appear otherwise normal and symmetric with bilateral lens extractions. Otherwise symmetric appearance of the extraocular musculature and optic nerve sheath complexes. Normal caliber of the superior ophthalmic veins. Sinuses: Mild mural thickening noted throughout the ethmoid sinuses without large volume hemosinus. Mastoid air cells and middle ear cavities are clear. Ossicular chains appear normally configured. Soft tissues: Extensive scalp swelling extending into the supraorbital soft tissues, left significantly greater than right with asymmetric left palpebral thickening. Additional mild left malar soft tissue swelling is noted as well. No soft tissue gas or foreign body. Intubation at the time of exam. CT CERVICAL SPINE FINDINGS Alignment: Cervical stabilization collar is present at the time of examination. There is straightening and slight reversal the normal cervical lordosis centered at the C5 level. No evidence of traumatic listhesis. No abnormally widened, perched or jumped facets. Normal alignment of the craniocervical articulations. Effacement of the atlantodental articulation with some arthrosis, erosive changes and calcific pannus formation which is nonspecific but can be seen in the setting of underlying rheumatoid or CPPD arthropathy. Skull base and vertebrae: No acute skull base fracture. No vertebral body fracture or height loss. The osseous structures appear diffusely demineralized which may limit detection of small or nondisplaced fractures. No worrisome osseous lesions. Partial bony fusion across the C4-5 vertebral bodies as well as the spinous process and articular facets of C4-5. Multilevel intervertebral disc height loss with spondylitic endplate changes. Erosive changes at the atlantodental interval, as detailed above. Additional note made of some mild ossification of the posterior longitudinal ligament, most pronounced  C6. Enthesopathic mineralization is noted along the nuchal ligament/supraspinous ligament. Soft tissues and spinal canal: No pre or paravertebral fluid or swelling. No visible canal hematoma. Disc levels: Multilevel intervertebral disc height loss with spondylitic endplate changes. Posterior osseous ridging C4-5 as well as a disc osteophyte complex  and some ossification of posterior longitudinal ligament at C6 result in mild multifocal canal stenosis across these levels. Additional smaller disc osteophyte complex and ligamentum flavum infolding C3-4 results in a second site of mild canal stenosis. Other smaller disc osteophyte complexes partially efface the ventral thecal sac without significant resulting canal impingement. Multilevel uncinate spurring and facet hypertrophic changes are present throughout the cervical spine resulting in moderate multilevel neural foraminal narrowing with features most pronounced C3-4 bilaterally. Upper chest: For findings in the upper chest please see dedicated CT chest, abdomen and pelvis performed and dictated separately. Endotracheal and transesophageal intubation at the time of exam. Secretions noted in the airways and oropharynx above the level of the endotracheal balloon. Cervical carotid and proximal great vessel atherosclerotic changes. Other: No concerning thyroid nodules or masses. IMPRESSION: 1. Extensive comminuted calvarial fracture extending across the bilateral parietal bones and occipital bone with extension into the skull base. There are bilateral epidural hemorrhages with additional subdural, subarachnoid and intraventricular hemorrhages as detailed above. No significant mass effect or midline shift is present at this time, likely mitigated by a background of parenchymal volume loss. 2. Fracture line extends into the left orbital roof with some adjacent extraconal hemorrhage and resulting mild asymmetric proptosis of the left globe. Extensive left periorbital soft  tissue swelling is noted as well. 3. Medial fracture line propagation into the anterior skull base extending into the margin between the cribriform plate and planum ethmoidale on the left. 4. No other acute facial bone fracture is seen. 5. No acute cervical spine fracture or traumatic listhesis. 6. Multilevel degenerative changes throughout the cervical spine, as described above. 7. Endotracheal and transesophageal intubation at the time of exam with extensive secretions noted in the airways and oropharynx above the level of the endotracheal balloon. 8. For findings in the upper chest please see dedicated CT chest, abdomen and pelvis performed and dictated separately. Critical Value/emergent results were called immediately by telephone at the time of detection on 07/01/2020 at 3:40 am to provider [facility] , who verbally acknowledged these results. Electronically Signed   By: Lovena Le M.D.   On: 07/17/2020 04:04   CT Cervical Spine Wo Contrast  Result Date: 07/10/2020 CLINICAL DATA:  Suspected trauma, found unresponsive at the bottom of stairs EXAM: CT HEAD WITHOUT CONTRAST CT MAXILLOFACIAL WITHOUT CONTRAST CT CERVICAL SPINE WITHOUT CONTRAST TECHNIQUE: Multidetector CT imaging of the head, cervical spine, and maxillofacial structures were performed using the standard protocol without intravenous contrast. Multiplanar CT image reconstructions of the cervical spine and maxillofacial structures were also generated. COMPARISON:  CT head 11/24/2017, PET-CT 10/24/2019 FINDINGS: CT HEAD FINDINGS Brain: Extensive subarachnoid hemorrhage across the right cerebral convexity extending across the right frontal, parietal, temporal and to a lesser extent occipital lobes. A lentiform epidural collection is seen across the right parietal lobe measuring up to 5 mm in maximal thickness (5/46). Additional subdural hemorrhage extending across the right cerebral convexity measuring up to 7 mm in maximal thickness. There  is additional left epidural hemorrhage across the high left parietal convexity as well measuring up to 5 mm in maximal thickness subjacent to the calvarial fracture line as well (5/49). Additional mixed attenuation hemorrhagic products are seen layering dependently within the occipital horns of the lateral ventricles. No significant mass effect or midline shift is present at this time, likely medicated by a background of moderate parenchymal volume loss. No other acute intracranial abnormality is seen, specifically no CT evidence of infarct. Ventricular caliber is similar to  comparison arguing against the presence of a developing hydrocephaly at this time. Patchy areas of white matter hypoattenuation are most compatible with chronic microvascular angiopathy. Vascular: Atherosclerotic calcification of the carotid siphons and intradural vertebral arteries. No hyperdense vessel. Skull: There is a complex, comminuted calvarial fracture line with a dominant transverse fracture line extending from the right parietal bone, crossing the lambdoid sutures and superior apex of the occipital bone and into the left occipital bone best seen on coronal reconstructions (5/50-60). Additional smaller comminuted fracture lines extend across the superior right parietal bone, inferiorly into the occipital bone, and anteriorly into the superior and lateral walls of the right orbit and into the right planum additional fracture line extension is seen across the more superior right parietal bone, inferiorly into the left occipital bone, and anteriorly into the left orbital roof and into border between the left cribriform plate and planum ethmoidale. There is extensive circumferential scalp swelling and hematoma particularly superficial to the fracture lines. Other: None. CT MAXILLOFACIAL FINDINGS Osseous: Fracture line extension into the roof of the left orbit with medial extension into the region of the planum ethmoidale and cribriform  plate. No other visible fracture of the bony orbits. Nasal bones are intact. No other mid face fractures are seen. The pterygoid plates are intact. No visible or suspected temporal bone fractures. 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 Some chronic erosive changes of the alveolar ridge mandible is noted. Mandible is otherwise intact. Orbits: Small amount of retro septal swelling and hemorrhage along the orbital roof adjacent the fracture line as well as a slight asymmetric proptosis of the left globe. Additional left periorbital/supraorbital soft tissue swelling is present. No retro septal or intraconal gas. The globes appear otherwise normal and symmetric with bilateral lens extractions. Otherwise symmetric appearance of the extraocular musculature and optic nerve sheath complexes. Normal caliber of the superior ophthalmic veins. Sinuses: Mild mural thickening noted throughout the ethmoid sinuses without large volume hemosinus. Mastoid air cells and middle ear cavities are clear. Ossicular chains appear normally configured. Soft tissues: Extensive scalp swelling extending into the supraorbital soft tissues, left significantly greater than right with asymmetric left palpebral thickening. Additional mild left malar soft tissue swelling is noted as well. No soft tissue gas or foreign body. Intubation at the time of exam. CT CERVICAL SPINE FINDINGS Alignment: Cervical stabilization collar is present at the time of examination. There is straightening and slight reversal the normal cervical lordosis centered at the C5 level. No evidence of traumatic listhesis. No abnormally widened, perched or jumped facets. Normal alignment of the craniocervical articulations. Effacement of the atlantodental articulation with some arthrosis, erosive changes and calcific pannus formation which is nonspecific but can be seen in the setting of underlying rheumatoid or CPPD arthropathy. Skull  base and vertebrae: No acute skull base fracture. No vertebral body fracture or height loss. The osseous structures appear diffusely demineralized which may limit detection of small or nondisplaced fractures. No worrisome osseous lesions. Partial bony fusion across the C4-5 vertebral bodies as well as the spinous process and articular facets of C4-5. Multilevel intervertebral disc height loss with spondylitic endplate changes. Erosive changes at the atlantodental interval, as detailed above. Additional note made of some mild ossification of the posterior longitudinal ligament, most pronounced C6. Enthesopathic mineralization is noted along the nuchal ligament/supraspinous ligament. Soft tissues and spinal canal: No pre or paravertebral fluid or swelling. No visible canal hematoma. Disc levels: Multilevel intervertebral disc height loss with spondylitic endplate  changes. Posterior osseous ridging C4-5 as well as a disc osteophyte complex and some ossification of posterior longitudinal ligament at C6 result in mild multifocal canal stenosis across these levels. Additional smaller disc osteophyte complex and ligamentum flavum infolding C3-4 results in a second site of mild canal stenosis. Other smaller disc osteophyte complexes partially efface the ventral thecal sac without significant resulting canal impingement. Multilevel uncinate spurring and facet hypertrophic changes are present throughout the cervical spine resulting in moderate multilevel neural foraminal narrowing with features most pronounced C3-4 bilaterally. Upper chest: For findings in the upper chest please see dedicated CT chest, abdomen and pelvis performed and dictated separately. Endotracheal and transesophageal intubation at the time of exam. Secretions noted in the airways and oropharynx above the level of the endotracheal balloon. Cervical carotid and proximal great vessel atherosclerotic changes. Other: No concerning thyroid nodules or masses.  IMPRESSION: 1. Extensive comminuted calvarial fracture extending across the bilateral parietal bones and occipital bone with extension into the skull base. There are bilateral epidural hemorrhages with additional subdural, subarachnoid and intraventricular hemorrhages as detailed above. No significant mass effect or midline shift is present at this time, likely mitigated by a background of parenchymal volume loss. 2. Fracture line extends into the left orbital roof with some adjacent extraconal hemorrhage and resulting mild asymmetric proptosis of the left globe. Extensive left periorbital soft tissue swelling is noted as well. 3. Medial fracture line propagation into the anterior skull base extending into the margin between the cribriform plate and planum ethmoidale on the left. 4. No other acute facial bone fracture is seen. 5. No acute cervical spine fracture or traumatic listhesis. 6. Multilevel degenerative changes throughout the cervical spine, as described above. 7. Endotracheal and transesophageal intubation at the time of exam with extensive secretions noted in the airways and oropharynx above the level of the endotracheal balloon. 8. For findings in the upper chest please see dedicated CT chest, abdomen and pelvis performed and dictated separately. Critical Value/emergent results were called immediately by telephone at the time of detection on 07/07/2020 at 3:40 am to provider [facility] , who verbally acknowledged these results. Electronically Signed   By: Lovena Le M.D.   On: 07/11/2020 04:04   DG Pelvis Portable  Result Date: 06/25/2020 CLINICAL DATA:  Fall downstairs with pelvic pain, initial encounter EXAM: PORTABLE PELVIS 1-2 VIEWS COMPARISON:  None. FINDINGS: Prostate brachytherapy seeds are noted. Pelvic ring as visualized is within normal limits. No acute fracture is seen. No soft tissue abnormality is noted. IMPRESSION: No acute abnormality seen. Electronically Signed   By: Inez Catalina M.D.   On: 07/15/2020 03:18   CT CHEST ABDOMEN PELVIS W CONTRAST  Result Date: 07/17/2020 CLINICAL DATA:  Fall downstairs and found unresponsive, initial encounter EXAM: CT CHEST, ABDOMEN, AND PELVIS WITH CONTRAST TECHNIQUE: Multidetector CT imaging of the chest, abdomen and pelvis was performed following the standard protocol during bolus administration of intravenous contrast. CONTRAST:  167mL OMNIPAQUE IOHEXOL 300 MG/ML  SOLN COMPARISON:  Plain films from earlier in the same day, PET-CT from 10/24/2019. FINDINGS: CT CHEST FINDINGS Cardiovascular: Thoracic aorta demonstrates atherosclerotic calcification without aneurysmal dilatation or dissection. No cardiac enlargement is seen. Heavy coronary calcifications are noted. The pulmonary artery as visualized is within normal limits. Mediastinum/Nodes: Thoracic inlet is unremarkable. Endotracheal tube and gastric catheter are noted in place. The gastric catheter could be advanced further into the stomach although prior chest x-ray shows more proper positioning. No sizable hilar or mediastinal adenopathy is noted.  The esophagus as visualized is within normal limits. Lungs/Pleura: Lungs show diffuse emphysematous changes. Right lung is otherwise clear. Left lung demonstrates a focal rounded soft tissue lesion along the diaphragm in the left lower lobe measuring 2.4 cm in greatest dimension. It has some central necrosis. An 8 mm left lower lobe associated nodule is noted best seen on image number 110 of series 5. Also a 11 mm nodule in the left upper lobe is noted. These changes are suspicious for multifocal neoplasm. These were not present on the prior PET-CT from 10/24/2019 Musculoskeletal: Degenerative changes of the thoracic spine are noted. Ribs are well visualized healed rib fractures on the right. No acute rib fracture is seen. CT ABDOMEN PELVIS FINDINGS Hepatobiliary: No focal liver abnormality is seen. No gallstones, gallbladder wall thickening, or  biliary dilatation. Pancreas: Unremarkable. No pancreatic ductal dilatation or surrounding inflammatory changes. Spleen: Normal in size without focal abnormality. Adrenals/Urinary Tract: Adrenal glands are within normal limits. Kidneys demonstrate a normal enhancement pattern. A few small cysts are seen in the right kidney. Prominent extrarenal pelvis is noted on the left. Fullness of the ureters is seen bilaterally although no obstructing lesions are noted. Bladder is well distended. Stomach/Bowel: No obstructive or inflammatory changes of the colon are seen. The appendix is not well visualized and may have been surgically removed. No inflammatory changes are noted. Small bowel and stomach are within normal limits. Vascular/Lymphatic: Aortic atherosclerotic calcifications are noted without aneurysmal dilatation. These changes are stable in appearance from the prior exam. No sizable lymphadenopathy is noted. Reproductive: Prostate demonstrates multiple therapy seeds throughout. Other: No abdominal wall hernia or abnormality. No abdominopelvic ascites. Musculoskeletal: Degenerative changes of the lumbar spine are noted. No lytic or sclerotic lesions are seen. No compression deformities are noted IMPRESSION: CT of the chest: Patchy soft tissue nodules throughout the left upper and lower lobe which likely represent metastatic disease. Further workup can be performed when the patient's condition improves. PET-CT would be helpful. No acute bony abnormality is noted. Aortic Atherosclerosis (ICD10-I70.0) and Emphysema (ICD10-J43.9). CT of the abdomen and pelvis: Fullness of the collecting systems bilaterally without obstructing lesions. This is likely in part due to the distention of the bladder. No acute visceral injury is noted. Critical Value/emergent results were called by telephone at the time of interpretation on 06/25/2020 at 3:39 am to Dr. Randal Buba , who verbally acknowledged these results. Electronically Signed   By:  Inez Catalina M.D.   On: 06/26/2020 03:41   DG Chest Port 1 View  Result Date: 07/11/2020 CLINICAL DATA:  Fall downstairs with chest pain EXAM: PORTABLE CHEST 1 VIEW COMPARISON:  None FINDINGS: Cardiac shadow is within normal limits. Aortic calcifications are seen. Endotracheal tube and gastric catheter are noted. The gastric catheter has been advanced further into the stomach. Lungs are well aerated bilaterally. No focal infiltrate or sizable effusion is seen. No acute rib abnormality is noted. IMPRESSION: Tubes and lines in satisfactory position. No acute abnormality seen. Electronically Signed   By: Inez Catalina M.D.   On: 07/13/2020 03:19   EEG adult  Result Date: 06/26/2020 Lora Havens, MD     06/26/2020 10:31 AM Patient Name: MARIUS BETTS MRN: [id number] Epilepsy Attending: Lora Havens Referring Physician/Provider: Dr. Georganna Skeans Date: 06/26/2020 Duration: 36.46 mins Patient history: 87 year old male with past medical history of metastatic prostate cancer who was found down by his wife.  On arrival CT head showed extensive subarachnoid hemorrhage across the right cerebral convexity.  Today  he was noted to have seizure-like activity on the left side of face.  EEG to evaluate for seizures. Level of alertness: comatose AEDs during EEG study: LEV Technical aspects: This EEG study was done with scalp electrodes positioned according to the 10-20 International system of electrode placement. Electrical activity was acquired at a sampling rate of 500Hz  and reviewed with a high frequency filter of 70Hz  and a low frequency filter of 1Hz . EEG data were recorded continuously and digitally stored. Description: Patient was noted to have multiple episodes of left face and eye twitching.  Concomitant EEG showed right frontocentral sharp wave with sharply contoured 9 to 10 Hz alpha activity which gradually evolved into 5 to 6 Hz theta slowing and eventually 2 to 3 Hz delta slowing and involved right hemisphere.   EEG also showed continuous generalized 3 to 6 Hz theta-delta slowing.  Hyperventilation and photic stimulation were not performed.   ABNORMALITY -Focal motor status epilepticus, right frontocentral region -Continuous slow, generalized IMPRESSION: This study showed focal motor status epilepticus arising from right frontocentral region.  Additionally there is moderate to severe diffuse encephalopathy, nonspecific etiology. Dr. Grandville Silos was notified. Priyanka Barbra Sarks   Overnight EEG with video  Result Date: 07/25/20 Lora Havens, MD     06/28/2020 12:46 AM Patient Name: KOSISOCHUKWU BURNINGHAM MRN: [id number] Epilepsy Attending: Lora Havens Referring Physician/Provider: Dr. [name] Duration: 06/26/2020 1011 to 07/25/2020 1025  Patient history: 87 year old male with past medical history of metastatic prostate cancer who was found down by his wife.  On arrival CT head showed extensive subarachnoid hemorrhage across the right cerebral convexity.  Today he was noted to have seizure-like activity on the left side of face.  EEG to evaluate for seizures.  Level of alertness: comatose  AEDs during EEG study: LEV, PHT, propofol  Technical aspects: This EEG study was done with scalp electrodes positioned according to the 10-20 International system of electrode placement. Electrical activity was acquired at a sampling rate of 500Hz  and reviewed with a high frequency filter of 70Hz  and a low frequency filter of 1Hz . EEG data were recorded continuously and digitally stored.  Description: Patient was initially noted to have multiple episodes of left face and eye twitching.  Concomitant EEG showed right frontocentral sharp wave with sharply contoured 9 to 10 Hz alpha activity which gradually evolved into 5 to 6 Hz theta slowing and eventually 2 to 3 Hz delta slowing and involved right hemisphere. Propofol was started around 1530 after which seizures stopped. Lateralized periodic discharges were noted in right  frontocentral region at 0.5-1hz  which gradually evolved into sharp waves into right frontocentral region.  EEG also showed continuous generalized 3 to 6 Hz theta-delta slowing which gradually progressed to generalized suppression. Around 1025, EKG was noted to be bradycardic which then progressed to asystole.    ABNORMALITY -Focal motor status epilepticus, right frontocentral region - Lateralized periodic discharges,  right frontocentral region - Sharp waves, right frontocentral region - Continuous slow, generalized - background suppression  IMPRESSION: This study initially showed focal motor status epilepticus arising from right frontocentral region which resolved after about 1530. EEG then showed evidence of epileptogenicity arising from right frontocentral region  due to underlying bleed. There is also severe diffuse encephalopathy, nonspecific etiology but likely related to sedation which gradually progressed to profound diffuse encephalopathy. Around 1025, EKG was noted to be bradycardic which then progressed to asystole.   Lora Havens   CT Maxillofacial WO CM  Result Date: 07/01/2020  CLINICAL DATA:  Suspected trauma, found unresponsive at the bottom of stairs EXAM: CT HEAD WITHOUT CONTRAST CT MAXILLOFACIAL WITHOUT CONTRAST CT CERVICAL SPINE WITHOUT CONTRAST TECHNIQUE: Multidetector CT imaging of the head, cervical spine, and maxillofacial structures were performed using the standard protocol without intravenous contrast. Multiplanar CT image reconstructions of the cervical spine and maxillofacial structures were also generated. COMPARISON:  CT head 11/24/2017, PET-CT 10/24/2019 FINDINGS: CT HEAD FINDINGS Brain: Extensive subarachnoid hemorrhage across the right cerebral convexity extending across the right frontal, parietal, temporal and to a lesser extent occipital lobes. A lentiform epidural collection is seen across the right parietal lobe measuring up to 5 mm in maximal thickness (5/46).  Additional subdural hemorrhage extending across the right cerebral convexity measuring up to 7 mm in maximal thickness. There is additional left epidural hemorrhage across the high left parietal convexity as well measuring up to 5 mm in maximal thickness subjacent to the calvarial fracture line as well (5/49). Additional mixed attenuation hemorrhagic products are seen layering dependently within the occipital horns of the lateral ventricles. No significant mass effect or midline shift is present at this time, likely medicated by a background of moderate parenchymal volume loss. No other acute intracranial abnormality is seen, specifically no CT evidence of infarct. Ventricular caliber is similar to comparison arguing against the presence of a developing hydrocephaly at this time. Patchy areas of white matter hypoattenuation are most compatible with chronic microvascular angiopathy. Vascular: Atherosclerotic calcification of the carotid siphons and intradural vertebral arteries. No hyperdense vessel. Skull: There is a complex, comminuted calvarial fracture line with a dominant transverse fracture line extending from the right parietal bone, crossing the lambdoid sutures and superior apex of the occipital bone and into the left occipital bone best seen on coronal reconstructions (5/50-60). Additional smaller comminuted fracture lines extend across the superior right parietal bone, inferiorly into the occipital bone, and anteriorly into the superior and lateral walls of the right orbit and into the right planum additional fracture line extension is seen across the more superior right parietal bone, inferiorly into the left occipital bone, and anteriorly into the left orbital roof and into border between the left cribriform plate and planum ethmoidale. There is extensive circumferential scalp swelling and hematoma particularly superficial to the fracture lines. Other: None. CT MAXILLOFACIAL FINDINGS Osseous: Fracture  line extension into the roof of the left orbit with medial extension into the region of the planum ethmoidale and cribriform plate. No other visible fracture of the bony orbits. Nasal bones are intact. No other mid face fractures are seen. The pterygoid plates are intact. No visible or suspected temporal bone fractures. 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 Some chronic erosive changes of the alveolar ridge mandible is noted. Mandible is otherwise intact. Orbits: Small amount of retro septal swelling and hemorrhage along the orbital roof adjacent the fracture line as well as a slight asymmetric proptosis of the left globe. Additional left periorbital/supraorbital soft tissue swelling is present. No retro septal or intraconal gas. The globes appear otherwise normal and symmetric with bilateral lens extractions. Otherwise symmetric appearance of the extraocular musculature and optic nerve sheath complexes. Normal caliber of the superior ophthalmic veins. Sinuses: Mild mural thickening noted throughout the ethmoid sinuses without large volume hemosinus. Mastoid air cells and middle ear cavities are clear. Ossicular chains appear normally configured. Soft tissues: Extensive scalp swelling extending into the supraorbital soft tissues, left significantly greater than right with asymmetric left palpebral thickening. Additional mild left malar  soft tissue swelling is noted as well. No soft tissue gas or foreign body. Intubation at the time of exam. CT CERVICAL SPINE FINDINGS Alignment: Cervical stabilization collar is present at the time of examination. There is straightening and slight reversal the normal cervical lordosis centered at the C5 level. No evidence of traumatic listhesis. No abnormally widened, perched or jumped facets. Normal alignment of the craniocervical articulations. Effacement of the atlantodental articulation with some arthrosis, erosive changes and  calcific pannus formation which is nonspecific but can be seen in the setting of underlying rheumatoid or CPPD arthropathy. Skull base and vertebrae: No acute skull base fracture. No vertebral body fracture or height loss. The osseous structures appear diffusely demineralized which may limit detection of small or nondisplaced fractures. No worrisome osseous lesions. Partial bony fusion across the C4-5 vertebral bodies as well as the spinous process and articular facets of C4-5. Multilevel intervertebral disc height loss with spondylitic endplate changes. Erosive changes at the atlantodental interval, as detailed above. Additional note made of some mild ossification of the posterior longitudinal ligament, most pronounced C6. Enthesopathic mineralization is noted along the nuchal ligament/supraspinous ligament. Soft tissues and spinal canal: No pre or paravertebral fluid or swelling. No visible canal hematoma. Disc levels: Multilevel intervertebral disc height loss with spondylitic endplate changes. Posterior osseous ridging C4-5 as well as a disc osteophyte complex and some ossification of posterior longitudinal ligament at C6 result in mild multifocal canal stenosis across these levels. Additional smaller disc osteophyte complex and ligamentum flavum infolding C3-4 results in a second site of mild canal stenosis. Other smaller disc osteophyte complexes partially efface the ventral thecal sac without significant resulting canal impingement. Multilevel uncinate spurring and facet hypertrophic changes are present throughout the cervical spine resulting in moderate multilevel neural foraminal narrowing with features most pronounced C3-4 bilaterally. Upper chest: For findings in the upper chest please see dedicated CT chest, abdomen and pelvis performed and dictated separately. Endotracheal and transesophageal intubation at the time of exam. Secretions noted in the airways and oropharynx above the level of the  endotracheal balloon. Cervical carotid and proximal great vessel atherosclerotic changes. Other: No concerning thyroid nodules or masses. IMPRESSION: 1. Extensive comminuted calvarial fracture extending across the bilateral parietal bones and occipital bone with extension into the skull base. There are bilateral epidural hemorrhages with additional subdural, subarachnoid and intraventricular hemorrhages as detailed above. No significant mass effect or midline shift is present at this time, likely mitigated by a background of parenchymal volume loss. 2. Fracture line extends into the left orbital roof with some adjacent extraconal hemorrhage and resulting mild asymmetric proptosis of the left globe. Extensive left periorbital soft tissue swelling is noted as well. 3. Medial fracture line propagation into the anterior skull base extending into the margin between the cribriform plate and planum ethmoidale on the left. 4. No other acute facial bone fracture is seen. 5. No acute cervical spine fracture or traumatic listhesis. 6. Multilevel degenerative changes throughout the cervical spine, as described above. 7. Endotracheal and transesophageal intubation at the time of exam with extensive secretions noted in the airways and oropharynx above the level of the endotracheal balloon. 8. For findings in the upper chest please see dedicated CT chest, abdomen and pelvis performed and dictated separately. Critical Value/emergent results were called immediately by telephone at the time of detection on 06/28/2020 at 3:40 am to provider [facility] , who verbally acknowledged these results. Electronically Signed   By: Lovena Le M.D.   On: 07/02/2020  04:04    Microbiology Recent Results (from the past 240 hour(s))  SARS Coronavirus 2 by RT PCR (hospital order, performed in [facility] hospital lab) Nasopharyngeal Nasopharyngeal Swab     Status: None   Collection Time: 07/03/2020  2:40 AM   Specimen: Nasopharyngeal  Swab  Result Value Ref Range Status   SARS Coronavirus 2 NEGATIVE NEGATIVE Final    Comment: (NOTE) SARS-CoV-2 target nucleic acids are NOT DETECTED.  The SARS-CoV-2 RNA is generally detectable in upper and lower respiratory specimens during the acute phase of infection. The lowest concentration of SARS-CoV-2 viral copies this assay can detect is 250 copies / mL. A negative result does not preclude SARS-CoV-2 infection and should not be used as the sole basis for treatment or other patient management decisions.  A negative result may occur with improper specimen collection / handling, submission of specimen other than nasopharyngeal swab, presence of viral mutation(s) within the areas targeted by this assay, and inadequate number of viral copies (<250 copies / mL). A negative result must be combined with clinical observations, patient history, and epidemiological information.  Fact Sheet for Patients:   StrictlyIdeas.no  Fact Sheet for Healthcare Providers: BankingDealers.co.za  This test is not yet approved or  cleared by the [place] FDA and has been authorized for detection and/or diagnosis of SARS-CoV-2 by FDA under an Emergency Use Authorization (EUA).  This EUA will remain in effect (meaning this test can be used) for the duration of the COVID-19 declaration under Section 564(b)(1) of the Act, 21 U.S.C. section 360bbb-3(b)(1), unless the authorization is terminated or revoked sooner.  Performed at [facility], [place] [address], [place], [place] [address]   MRSA PCR Screening     Status: None   Collection Time: 06/26/2020  6:02 AM   Specimen: Nasopharyngeal  Result Value Ref Range Status   MRSA by PCR NEGATIVE NEGATIVE Final    Comment:        The GeneXpert MRSA Assay (FDA approved for NASAL specimens only), is one component of a comprehensive MRSA colonization surveillance program. It is not intended to diagnose  MRSA infection nor to guide or monitor treatment for MRSA infections. Performed at [facility], [place] [address], [place], [place] [address]     Lab Basic Metabolic Panel: Recent Labs  Lab 06/25/20 0424 06/26/20 0049 07/27/2020 0453  NA 142 145 148*  K 4.8 4.2 5.9*  CL 109 113* 119*  CO2 20* 21* 16*  GLUCOSE 170* 134* 118*  BUN 22 23 39*  CREATININE 1.25* 1.13 2.44*  CALCIUM 8.9 8.5* 7.9*  MG 1.9 2.0 2.1  PHOS 3.1 3.0 7.2*   Liver Function Tests: Recent Labs  Lab 06/25/20 0424 06/26/20 0049 July 27, 2020 0453  AST 21 16 98*  ALT 19 15 64*  ALKPHOS 49 45 40  BILITOT 0.6 0.4 0.7  PROT 7.2 6.4* 4.8*  ALBUMIN 2.9* 2.3* 1.6*   No results for input(s): LIPASE, AMYLASE in the last 168 hours. No results for input(s): AMMONIA in the last 168 hours. CBC: Recent Labs  Lab 06/25/20 0424 06/26/20 0049 07/27/2020 0453  WBC 13.3* 14.3* 15.1*  HGB 11.2* 9.3* 9.6*  HCT 37.5* 30.7* 32.9*  MCV 89.1 87.5 93.5  PLT 226 278 278   Cardiac Enzymes: No results for input(s): CKTOTAL, CKMB, CKMBINDEX, TROPONINI in the last 168 hours. Sepsis Labs: Recent Labs  Lab 06/25/20 0424 06/26/20 0049 2020-07-27 0453  WBC 13.3* 14.3* 15.1*    Procedures/Operations  N/A  Zenovia Jarred 07/01/2020, 11:47 AM

## 2020-07-21 NOTE — Progress Notes (Signed)
   2020-07-08 0958  Clinical Encounter Type  Visited With Patient and family together  Visit Type Patient actively dying  Referral From Nurse  Consult/Referral To [place] responded to page. Pt reached end of life. Chaplain prayed with Pt's wife and other family at bedside. Chaplain remains available.   This note was prepared by Chaplain Resident, Dante Gang, MDiv. Chaplain remains available as needed through the on-call pager: [phone].

## 2020-07-21 NOTE — Progress Notes (Addendum)
Palliative Medicine Inpatient Follow Up Note  Reason for consult:  Goals of Care  HPI:  Per intake H&P --> Thomas Lopez is an 87 y.o. male with a past medical history of metastaticprostate cancer status post seed implant/ radiation and now on Lupron, hypertension, CKD 3, chronic anemia,hyperlipidemia, glaucoma, GERD, irritable bowel syndrome, carotid stenosis and aortoiliac atherosclerosis, who was found down by his wife at the bottom of his steps after presumably falling. His wife woke up during the night to use the bathroom, and noted the patient was not in bed. A quick search by the wife found him to be laying down at the bottom of their basement steps. He fell approximately 15-20 steps. His wife stated he was in his normal state prior to going to bed. On arrival to the ED, the patient was unresponsive and was hypertensive.  Palliative care has been asked to get involved to discuss goals of care.  Today's Discussion (July 06, 2020):  *Please note that this is a verbal dictation therefore any spelling or grammatical errors are due to the "[place] [facility]" system interpretation.  Chart reviewed.  Met with nursing staff this morning. Spoke to [employer], Therapist, sports. We discussed patients hypotension overnight and removal from fentanyl and propofol. Patient provided a bolus then started on neo then switch to levo. Per nursing patient not responsive off sedatives.   I called patients wife, [name] this morning and shared with her that I am concerned Dalonte will not survive this. I explained the events of the evening which she understood. She expressed that she is going to call his children and explain the situation to them.  I asked her to please let nursing know to contact me when she arrives.  Touched base with Mardene Celeste, RN after I spoke to [place].   Questions and concerns addressed  ______________________________________________________   Objective Assessment: Vital Signs Vitals:    06-Jul-2020 0730 2020/07/06 0745  BP: (!) 87/59 (!) 96/59  Pulse: 77 76  Resp: (!) 24 (!) 25  Temp:  (!) 97.52 F (36.4 C)  SpO2: 100% 96%    Intake/Output Summary (Last 24 hours) at 2020-07-06 0820 Last data filed at 07/06/2020 0700 Gross per 24 hour  Intake 2756.7 ml  Output 350 ml  Net 2406.7 ml   Last Weight  Most recent update: 2020-07-06  4:15 AM   Weight  61.6 kg (135 lb 12.9 oz)           Gen:  Elderly AA M intubated HEENT: dry mucous membranes,  (+)ETT, (+) coretrack CV: Regular rate and rhythm  PULM: Intubated, on ventilator ABD: soft/nontender  EXT: No edema  Neuro: unresponsive  SUMMARY OF RECOMMENDATIONS DNAR  Poor clinical progress - hypotension overnight. High concern for acute decline and dismal outcomes. Appreciate Neurosurgery and Trauma Surgery updating patient wife, [name].   Spiritual Support  Ongoing PMT support  Time Spent: 25 Greater than 50% of the time was spent in counseling and coordination of care ______________________________________________________________________________________ Addendum:  I met with patients wife, son, and daughter at bedside. I explained that his situation was likely terminal and that we needed to decide how to proceed with care. I shared that there is nothing further that can be done at this point and death was likely immanent.   Patients family understood, they were still waiting to hear from the surgery teams.  I offered to come back once they had spoke to the teams to help determine next steps.  - Update_ After about an  hour after leaving I received a message from patients RN that patients family had decided on comfort care and that he had passed away rather rapidly. His family was able to be present at bedside during that time. Chaplain services were also present.  Time in: 0910 Time Out: 0940 Additional Time: 68 minutes  [place] Team Team Cell Phone:  [phone] Please utilize secure chat with additional questions, if there is no response within 30 minutes please call the above phone number  Palliative Medicine Team providers are available by phone from 7am to 7pm daily and can be reached through the team cell phone.  Should this patient require assistance outside of these hours, please call the patient's attending physician.

## 2020-07-21 NOTE — Progress Notes (Signed)
Patient ID: Thomas Lopez, male   DOB: 1932-11-23, 87 y.o.   MRN: [id number] Delayed note entry  Pt seen & examined twice this am Follow up - Trauma Critical Care  Patient Details:    Thomas Lopez is an 87 y.o. male.  Lines/tubes : Airway 7.5 mm (Active)  Secured at (cm) 24 cm 06/26/20 0731  Measured From Lips 06/26/20 0731  Secured Location Left 06/26/20 0731  Secured By [employer] 06/26/20 0731  Tube Holder Repositioned Yes 06/26/20 0731  Prone position No 06/26/20 0345  Cuff Pressure (cm H2O) 24 cm H2O 07/18/2020 1957  Site Condition Dry 06/26/20 0731     NG/OG Tube Orogastric 16 Fr. Center mouth Xray Measured external length of tube 37 cm (Active)  Site Assessment Clean;Dry;Intact 06/25/20 2000  Ongoing Placement Verification No change in cm markings or external length of tube from initial placement;No change in respiratory status;No acute changes, not attributed to clinical condition 06/25/20 2000  Status Clamped 06/25/20 2000  Output (mL) 250 mL 06/26/20 0600     External Urinary Catheter (Active)  Collection Container Standard drainage bag 06/25/20 2000  Securement Method Securing device (Describe) 06/25/20 2000  Site Assessment Clean;Intact;Dry 06/25/20 2000  Output (mL) 300 mL 06/26/20 0600    Microbiology/Sepsis markers: Results for orders placed or performed during the hospital encounter of 07/14/2020  SARS Coronavirus 2 by RT PCR (hospital order, performed in [facility] hospital lab) Nasopharyngeal Nasopharyngeal Swab     Status: None   Collection Time: 07/07/2020  2:40 AM   Specimen: Nasopharyngeal Swab  Result Value Ref Range Status   SARS Coronavirus 2 NEGATIVE NEGATIVE Final    Comment: (NOTE) SARS-CoV-2 target nucleic acids are NOT DETECTED.  The SARS-CoV-2 RNA is generally detectable in upper and lower respiratory specimens during the acute phase of infection. The lowest concentration of SARS-CoV-2 viral copies this assay can detect is  250 copies / mL. A negative result does not preclude SARS-CoV-2 infection and should not be used as the sole basis for treatment or other patient management decisions.  A negative result may occur with improper specimen collection / handling, submission of specimen other than nasopharyngeal swab, presence of viral mutation(s) within the areas targeted by this assay, and inadequate number of viral copies (<250 copies / mL). A negative result must be combined with clinical observations, patient history, and epidemiological information.  Fact Sheet for Patients:   StrictlyIdeas.no  Fact Sheet for Healthcare Providers: BankingDealers.co.za  This test is not yet approved or  cleared by the [place] FDA and has been authorized for detection and/or diagnosis of SARS-CoV-2 by FDA under an Emergency Use Authorization (EUA).  This EUA will remain in effect (meaning this test can be used) for the duration of the COVID-19 declaration under Section 564(b)(1) of the Act, 21 U.S.C. section 360bbb-3(b)(1), unless the authorization is terminated or revoked sooner.  Performed at [facility], [place] [address] [place], [place] [address]   MRSA PCR Screening     Status: None   Collection Time: 06/25/2020  6:02 AM   Specimen: Nasopharyngeal  Result Value Ref Range Status   MRSA by PCR NEGATIVE NEGATIVE Final    Comment:        The GeneXpert MRSA Assay (FDA approved for NASAL specimens only), is one component of a comprehensive MRSA colonization surveillance program. It is not intended to diagnose MRSA infection nor to guide or monitor treatment for MRSA infections. Performed at [facility], [address], [place], [place] [address]     Anti-infectives:  Anti-infectives (From admission, onward)   None      Best Practice/Protocols:  VTE Prophylaxis: Mechanical Continous Sedation  Consults: Treatment Team:  Dawley, Troy  C, DO    Studies:    Events:  Subjective:    Overnight Issues:  Developed hypotn, started on levo; sedation stopped and has been unresponsive Objective:  Vital signs for last 24 hours: Temp:  [96.4 F (35.8 C)-99.5 F (37.5 C)] 99.14 F (37.3 C) (02/05 1015) Pulse Rate:  [25-103] 25 (02/05 1015) Resp:  [17-28] 17 (02/05 1015) BP: (47-188)/(14-100) 47/14 (02/05 1015) SpO2:  [63 %-100 %] 63 % (02/05 1015) FiO2 (%):  [80 %-100 %] 80 % (02/05 0720) Weight:  [61.6 kg] 61.6 kg (02/05 0400)  Hemodynamic parameters for last 24 hours:    Intake/Output from previous day: 02/04 0701 - 02/05 0700 In: 2756.7 [I.V.:1699.8; IV Piggyback:1056.9] Out: 350 [Urine:350]  Intake/Output this shift: Total I/O In: 413.3 [I.V.:133.3; NG/GT:10; IV Piggyback:270] Out: -   Vent settings for last 24 hours: Vent Mode: PRVC FiO2 (%):  [80 %-100 %] 80 % Set Rate:  [16 bmp] 16 bmp Vt Set:  [520 mL] 520 mL PEEP:  [5 cmH20] 5 cmH20 Plateau Pressure:  [14 cmH20-22 cmH20] 17 cmH20  Physical Exam:  General: on vent Neuro: pupils equal, pinpoint - don't appear to be reactive, no withdrawal/mvmt to stimuli,  HEENT/Neck: ETT Resp: clear to auscultation bilaterally CVS: RRR GI: soft, NT Extremities: no edema  Results for orders placed or performed during the hospital encounter of 07/14/2020 (from the past 24 hour(s))  Glucose, capillary     Status: Abnormal   Collection Time: 06/26/20  3:23 PM  Result Value Ref Range   Glucose-Capillary 142 (H) 70 - 99 mg/dL  Phenytoin level, total     Status: None   Collection Time: 06/26/20  3:34 PM  Result Value Ref Range   Phenytoin Lvl 11.8 10.0 - 20.0 ug/mL  Glucose, capillary     Status: Abnormal   Collection Time: 06/26/20  7:11 PM  Result Value Ref Range   Glucose-Capillary 109 (H) 70 - 99 mg/dL  Glucose, capillary     Status: None   Collection Time: 06/26/20 11:03 PM  Result Value Ref Range   Glucose-Capillary 84 70 - 99 mg/dL  Glucose,  capillary     Status: Abnormal   Collection Time: 07/19/20  3:15 AM  Result Value Ref Range   Glucose-Capillary 45 (L) 70 - 99 mg/dL  Glucose, capillary     Status: Abnormal   Collection Time: 2020/07/19  3:51 AM  Result Value Ref Range   Glucose-Capillary 147 (H) 70 - 99 mg/dL  CBC     Status: Abnormal   Collection Time: 07/19/2020  4:53 AM  Result Value Ref Range   WBC 15.1 (H) 4.0 - 10.5 K/uL   RBC 3.52 (L) 4.22 - 5.81 MIL/uL   Hemoglobin 9.6 (L) 13.0 - 17.0 g/dL   HCT 32.9 (L) 39.0 - 52.0 %   MCV 93.5 80.0 - 100.0 fL   MCH 27.3 26.0 - 34.0 pg   MCHC 29.2 (L) 30.0 - 36.0 g/dL   RDW 16.7 (H) 11.5 - 15.5 %   Platelets 278 150 - 400 K/uL   nRBC 0.0 0.0 - 0.2 %  Comprehensive metabolic panel     Status: Abnormal   Collection Time: 19-Jul-2020  4:53 AM  Result Value Ref Range  Sodium 148 (H) 135 - 145 mmol/L   Potassium 5.9 (H) 3.5 - 5.1 mmol/L   Chloride 119 (H) 98 - 111 mmol/L   CO2 16 (L) 22 - 32 mmol/L   Glucose, Bld 118 (H) 70 - 99 mg/dL   BUN 39 (H) 8 - 23 mg/dL   Creatinine, Ser 2.44 (H) 0.61 - 1.24 mg/dL   Calcium 7.9 (L) 8.9 - 10.3 mg/dL   Total Protein 4.8 (L) 6.5 - 8.1 g/dL   Albumin 1.6 (L) 3.5 - 5.0 g/dL   AST 98 (H) 15 - 41 U/L   ALT 64 (H) 0 - 44 U/L   Alkaline Phosphatase 40 38 - 126 U/L   Total Bilirubin 0.7 0.3 - 1.2 mg/dL   GFR, Estimated 25 (L) >60 mL/min   Anion gap 13 5 - 15  Magnesium     Status: None   Collection Time: 2020/07/17  4:53 AM  Result Value Ref Range   Magnesium 2.1 1.7 - 2.4 mg/dL  Phosphorus     Status: Abnormal   Collection Time: Jul 17, 2020  4:53 AM  Result Value Ref Range   Phosphorus 7.2 (H) 2.5 - 4.6 mg/dL  Triglycerides     Status: None   Collection Time: 07-17-20  4:53 AM  Result Value Ref Range   Triglycerides 58 <150 mg/dL  Glucose, capillary     Status: Abnormal   Collection Time: Jul 17, 2020  7:25 AM  Result Value Ref Range   Glucose-Capillary 110 (H) 70 - 99 mg/dL    Assessment & Plan: Present on Admission: . Traumatic  brain injury ([place])    LOS: 3 days   Additional comments:I reviewed the patient's new clinical lab test results. . Fall down stairs TBI, Calvarial fracture, extensive subarachnoid hemorrhage, subdural and epidural hemorrhage - F/U CT head 2/3 stable C/W 2/2. New onset SZ activity this AM L periorbital, mouth and LUE. Ativan X 1. D/W NS and they will increase Keppra and add Ativan PRN. Seizure mgmt per neurology. C spine - CT neg, D/W Dr. Vertell Limber, consider MR in a couple days if neuro status does not allow clearance Calvarial fracture line extension onto the roof of the left orbit and into the region of the cribriform plate, slight proptosis of the left globe - consider ophthalmology consult if patient neuro status improves Acute hypoxic ventilator dependent respiratory failure - weaned some yesterday. Mental status precludes extubation. HTN - home lisinopril FEN - TF held for vomiting, likely due to TBI as abd benign, OGT to suctio. Meds per tube OK. Could not get CorTrak post-pyloric yesterday. Potassium up to 5.9 - ordered ca gluconate and kayexalate;  AKI - Cr up to 2.44 VTE - PAS, OK for SQ hep per Dr. Reatha Armour today Dispo - pt hypotensive on max levo, no sedation on board. He is unresponsive. No mvmt to pain stimuli. Given his neurological exam, TBI, new onset AKI, ongoing hypoTN, I think death is imminent. Nurse was able to update family and alert them about change in pt's condition this am. When I arrived back in the unit to speak with family about switching to comfort care - pt had BP in 50s and was bradying down. Briefly discussed pt's current condition and events overnight and pt went asystolic.  No pulse. No heart sounds. Levo and vent turned off. Time of death 68.   Chaplain at [place] emotional support to family Critical Care Total Time*: [place]. Redmond Pulling, MD, FACS General, Bariatric, & Minimally  Invasive Surgery [facility], [place]   07-08-2020  *Care  during the described time interval was provided by me. I have reviewed this patient's available data, including medical history, events of note, physical examination and test results as part of my evaluation.

## 2020-07-21 NOTE — Progress Notes (Signed)
   Providing Compassionate, Quality Care - Together  NEUROSURGERY PROGRESS NOTE   S: Placed on burst suppression yesterday, became hypotensive overnight, required fluid resuscitation and is now on pressors, sedation has been stopped  O: EXAM:  BP (!) 96/59   Pulse 76   Temp (!) 97.52 F (36.4 C)   Resp (!) 25   Ht 5\' 7"  (1.702 m)   Wt 61.6 kg   SpO2 96%   BMI 21.27 kg/m   Intubated, sedation stopped Eyes closed to pain No motor response to pain bilaterally Bilateral midline gaze Pupils pinpoint  ASSESSMENT:  87 y.o. male with  1. Traumatic SDH/SAH w skull fractures 2. Sz 3. Traumatic encephalopathy  PLAN: - no nsx intervention - cont support care - ok for SQH - guarded prognosis - Patient's poor exam likely related to seizure/persistent hypotension/sedatives still on board.  Continue to hold sedation.  Wife made patient DNR -Continue blood pressure support for map goal of greater than 70 - AEDs per neurology    Thank you for allowing me to participate in this patient's care.  Please do not hesitate to call with questions or concerns.   Elwin Sleight, [facility] Cell: [phone]

## 2020-07-21 NOTE — Progress Notes (Signed)
Neurology Progress Note Thomas Lopez MR# [id number] 2020/07/13   S: No overnight events; No new complaints.   O: Current vital signs: BP (!) 47/14   Pulse (!) 25   Temp 99.14 F (37.3 C)   Resp 17   Ht 5' 7"  (1.702 m)   Wt 61.6 kg   SpO2 (!) 63%   BMI 21.27 kg/m  Vital signs in last 24 hours: Temp:  [96.4 F (35.8 C)-99.5 F (37.5 C)] 99.14 F (37.3 C) (02/05 1015) Pulse Rate:  [25-103] 25 (02/05 1015) Resp:  [16-28] 17 (02/05 1015) BP: (47-188)/(14-100) 47/14 (02/05 1015) SpO2:  [63 %-100 %] 63 % (02/05 1015) FiO2 (%):  [80 %-100 %] 80 % (02/05 0720) Weight:  [61.6 kg] 61.6 kg (02/05 0400) Constitutional: laying in bed, NAD Psych: unable to assess due to intubation Eyes: No scleral injection HENT: No OP obstrucion Head: Normocephalic.  Cardiovascular: Normal rate and regular rhythm.  Respiratory: intubated GI: Soft.  No distension.  Skin: Warm Neuro: doesn't open eyes to noxious stimulation, doesn't follow commands, miotic pupils with difficult to appreciate reactivity, corneal reflex absent, rhythmic left eyelid twitching, no forced gaze deviation, doesn't withdraw in BL UE, withdraws to noxious stimulation in Bl LE  Medications  Current Facility-Administered Medications:  .  0.9 %  sodium chloride infusion, , Intravenous, Continuous, Romana Juniper A, MD, Last Rate: 75 mL/hr at 07-13-2020 1000, Infusion Verify at 2020-07-13 1000 .  0.9 %  sodium chloride infusion, 250 mL, Intravenous, Continuous, Ralene Ok, MD .  brimonidine (ALPHAGAN) 0.2 % ophthalmic solution 1 drop, 1 drop, Both Eyes, BID, Jesusita Oka, MD, 1 drop at 2020-07-13 0909 .  calcium gluconate 1 g/ 50 mL sodium chloride IVPB, 1 g, Intravenous, Once, Greer Pickerel, MD .  chlorhexidine gluconate (MEDLINE KIT) (PERIDEX) 0.12 % solution 15 mL, 15 mL, Mouth Rinse, BID, Kae Heller, Chelsea A, MD, 15 mL at Jul 13, 2020 0900 .  Chlorhexidine Gluconate Cloth 2 % PADS 6 each, 6 each, Topical, Daily, Clovis Riley, MD, 6 each at 06/25/20 2134 .  dorzolamide-timolol (COSOPT) 22.3-6.8 MG/ML ophthalmic solution 1 drop, 1 drop, Both Eyes, BID, Lovick, Montel Culver, MD, 1 drop at 2020/07/13 0908 .  feeding supplement (PIVOT 1.5 CAL) liquid 1,000 mL, 1,000 mL, Per Tube, Continuous, Lovick, Montel Culver, MD, Stopped at 07/01/2020 2300 .  fentaNYL (SUBLIMAZE) bolus via infusion 25 mcg, 25 mcg, Intravenous, Q15 min PRN, Kae Heller, Chelsea A, MD .  fentaNYL 2550mg in NS 2544m(1041mml) infusion-PREMIX, 25-200 mcg/hr, Intravenous, Continuous, ConClovis RileyD, Stopped at 06/2020-06-2132 .  heparin injection 5,000 Units, 5,000 Units, Subcutaneous, Q8H, ThoGeorganna SkeansD, 5,000 Units at 02/02-21-2233 .  hydrALAZINE (APRESOLINE) injection 10 mg, 10 mg, Intravenous, Q2H PRN, ConRomana Juniper MD, 10 mg at 06/25/20 0829 .  latanoprost (XALATAN) 0.005 % ophthalmic solution 1 drop, 1 drop, Both Eyes, QHS, Lovick, AyeMontel CulverD, 1 drop at 06/26/20 2125 .  levETIRAcetam (KEPPRA) 2,000 mg in sodium chloride 0.9 % 250 mL IVPB, 2,000 mg, Intravenous, Q12H, YadLora HavensD, Stopped at 02/February 21, [id number] lisinopril (ZESTRIL) tablet 10 mg, 10 mg, Per Tube, Daily, LovJesusita OkaD, 10 mg at 06/26/20 0912 .  LORazepam (ATIVAN) injection 2 mg, 2 mg, Intravenous, PRN, Costella, Vincent J, PA-C, 2 mg at 06/26/20 1321 .  MEDLINE mouth rinse, 15 mL, Mouth Rinse, 10 times per day, ConRomana Juniper MD, 15 mL at 06/2020-06-2127 .  metoprolol tartrate (LOPRESSOR) injection 5 mg,  5 mg, Intravenous, Q6H PRN, Romana Juniper A, MD .  norepinephrine (LEVOPHED) 60m in 259mpremix infusion, 2-10 mcg/min, Intravenous, Titrated, RaRalene OkMD, Last Rate: 30 mL/hr at 0202/27/2022000, 8 mcg/min at 0202-27-2022000 .  ondansetron (ZOFRAN-ODT) disintegrating tablet 4 mg, 4 mg, Oral, Q6H PRN **OR** ondansetron (ZOFRAN) injection 4 mg, 4 mg, Intravenous, Q6H PRN, CoRomana Juniper, MD, 4 mg at 07/11/2020 2346 .  oxyCODONE (ROXICODONE) 5 MG/5ML  solution 2.5-5 mg, 2.5-5 mg, Per Tube, Q4H PRN, LoJesusita OkaMD .  pantoprazole sodium (PROTONIX) 40 mg/20 mL oral suspension 40 mg, 40 mg, Per Tube, Daily, ThGeorganna SkeansMD, 40 mg at 02Feb 27, 2022916 .  pilocarpine (PILOCAR) 1 % ophthalmic solution 1 drop, 1 drop, Left Eye, TID, LoJesusita OkaMD, 1 drop at 0202/27/2022910 .  propofol (DIPRIVAN) 1000 MG/100ML infusion, 5-80 mcg/kg/min, Intravenous, Titrated, YaLora HavensMD, Stopped at 0227-Feb-2022434 .  sodium polystyrene (KAYEXALATE) 15 GM/60ML suspension 30 g, 30 g, Per Tube, Once, WiGreer PickerelMD .  venlafaxine (ESinai-Grace Hospitaltablet 18.75 mg, 18.75 mg, Per Tube, BID, LoJesusita OkaMD, 18.75 mg at 022022-02-2796761abs     Component Value Date/Time   WBC 15.1 (H) 02Feb 27, 2022453   RBC 3.52 (L) 02February 27, 2022453   HGB 9.6 (L) 0227-Feb-2022453   HCT 32.9 (L) 0202/27/22453   PLT 278 0202-27-2022453   MCV 93.5 0202/27/2022453   MCH 27.3 02Feb 27, 2022453   MCHC 29.2 (L) 02Feb 27, 2022453   RDW 16.7 (H) 0202-27-22453       Component Value Date/Time   NA 148 (H) 022022-02-27453   K 5.9 (H) 0227-Feb-2022453   CL 119 (H) 0202-27-22453   CO2 16 (L) 0227-Feb-2022453   GLUCOSE 118 (H) 02February 27, 2022453   BUN 39 (H) 0202-27-2022453   CREATININE 2.44 (H) 0202/27/2022453   CALCIUM 7.9 (L) 0202/27/22453   PROT 4.8 (L) 02Feb 27, 2022453   ALBUMIN 1.6 (L) 02Feb 27, 2022453   AST 98 (H) 0202-27-2022453   ALT 64 (H) 0202/27/22453   ALKPHOS 40 0202-27-2022453   BILITOT 0.7 0202-27-2022453   GFRNONAA 25 (L) 02Feb 27, 2022453       Component Value Date/Time   TRIG 58 022022/02/27453    I have reviewed EEG showed - Focal motor status epilepticus, right frontocentral region which subsided after 1530 with initiation of propofol. - Lateralized periodic discharges occasionally seen in the  right frontocentral region - Sharp waves seen in the right frontocentral region - Continuous generalized slowing   I have reviewed images in epic and  the results pertinent to this consultation are: CT Head showed 06/25/2020: Unchanged appearance of mixed right convexity epidural, subdural and subarachnoid hemorrhage. Unchanged left convexity subdural hematoma. Unchanged punctate hemorrhages in the left frontal white matter and along the septum pellucidum. No hydrocephalus or new mass effect.     ASSESSMENT/PLAN: [age]ear old male presented after a fall and was found to have right epidural, subdural as well as subarachnoid hemorrhage as well as left subdural hematoma noted to have left face twitching and EEG showing focal motor status epilepticus in the right frontocentral region which subsided after 1530 with initiation of propofol.  Focal motor status epilepticus Subdural hemorrhage Subarachnoid hemorrhage Epidural hematoma Hyperkalemia Hyperphosphatemia Hypocalcemia Elevated liver enzymes Hypoproteinemia with hypoalbuminemia Leukocytosis Anemia Intubated and on ventilator   Recommendations: - Continue levetiracetam 200054mwice daily - Continue seizure, aspiration precautions. - Avoid ativan as its caused  bradycardia per staff. - Continue correcting metabolic derangements. - Continue supportive care. - Discussed plan with wife in detail.   Total critical care time: 30 minutes  Critical care time was exclusive of separately billable procedures and treating other patients.  Critical care was necessary to treat or prevent imminent or life-threatening deterioration.  Critical care was time spent personally by me on the following activities: development of treatment plan with patient and/or surrogate as well as nursing, discussions with consultants, evaluation of patient's response to treatment, examination of patient, obtaining history from patient or surrogate, ordering and performing treatments and interventions, ordering and review of laboratory studies, ordering and review of radiographic studies, pulse oximetry and  re-evaluation of patient's condition.  Electronically signed by:  Lynnae Sandhoff, MD Page: [phone] July 03, 2020, 11:57 AM

## 2020-07-21 NOTE — Progress Notes (Signed)
LTM EEG discontinued - no skin breakdown at unhook.   

## 2020-07-21 NOTE — Progress Notes (Signed)
Patient BP continues to trend down. Propofol and Fentanyl turned off. Bolus running and is ineffective. As a result, emergency Neo hung. Updated Dr. Rosendo Gros, advised to switched to Levo and continue to monitor pressure.   Hart Rochester, RN

## 2020-07-21 NOTE — Progress Notes (Signed)
Patient CBG 45. 25g of D50 given. Recheck in 77.  Hart Rochester, RN

## 2020-07-21 NOTE — Procedures (Addendum)
Patient Name: RASHED EDLER  MRN: [id number]  Epilepsy Attending: Lora Havens  Referring Physician/Provider: Dr. [name] Duration: 06/26/2020 1011 to 06/30/2020 1025  Patient history: 87 year old male with past medical history of metastatic prostate cancer who was found down by his wife.  On arrival CT head showed extensive subarachnoid hemorrhage across the right cerebral convexity.  Today he was noted to have seizure-like activity on the left side of face.  EEG to evaluate for seizures.  Level of alertness: comatose  AEDs during EEG study: LEV, PHT, propofol  Technical aspects: This EEG study was done with scalp electrodes positioned according to the 10-20 International system of electrode placement. Electrical activity was acquired at a sampling rate of 500Hz  and reviewed with a high frequency filter of 70Hz  and a low frequency filter of 1Hz . EEG data were recorded continuously and digitally stored.   Description: Patient was initially noted to have multiple episodes of left face and eye twitching.  Concomitant EEG showed right frontocentral sharp wave with sharply contoured 9 to 10 Hz alpha activity which gradually evolved into 5 to 6 Hz theta slowing and eventually 2 to 3 Hz delta slowing and involved right hemisphere. Propofol was started around 1530 after which seizures stopped. Lateralized periodic discharges were noted in right frontocentral region at 0.5-1hz  which gradually evolved into sharp waves into right frontocentral region.  EEG also showed continuous generalized 3 to 6 Hz theta-delta slowing which gradually progressed to generalized suppression. Around 1025, EKG was noted to be bradycardic which then progressed to asystole.     ABNORMALITY -Focal motor status epilepticus, right frontocentral region - Lateralized periodic discharges,  right frontocentral region - Sharp waves, right frontocentral region - Continuous slow, generalized - background  suppression  IMPRESSION: This study initially showed focal motor status epilepticus arising from right frontocentral region which resolved after about 1530. EEG then showed evidence of epileptogenicity arising from right frontocentral region  due to underlying bleed. There is also severe diffuse encephalopathy, nonspecific etiology but likely related to sedation which gradually progressed to profound diffuse encephalopathy. Around 1025, EKG was noted to be bradycardic which then progressed to asystole.     Corie Vavra Barbra Sarks

## 2020-07-21 DEATH — deceased

## 2020-12-18 ENCOUNTER — Ambulatory Visit: Payer: Self-pay | Admitting: Urology

## 2021-06-10 ENCOUNTER — Ambulatory Visit: Payer: Medicare PPO

## 2022-09-28 ENCOUNTER — Other Ambulatory Visit: Payer: Self-pay | Admitting: Hematology and Oncology
